# Patient Record
Sex: Female | Born: 1945 | Race: White | Hispanic: No | Marital: Married | Smoking: Never smoker
Health system: Southern US, Community
[De-identification: ages and names within clinical notes are randomized; demographics above are authoritative.]

## PROBLEM LIST (undated history)

## (undated) DIAGNOSIS — E785 Hyperlipidemia, unspecified: Secondary | ICD-10-CM

## (undated) DIAGNOSIS — IMO0002 Reserved for concepts with insufficient information to code with codable children: Secondary | ICD-10-CM

## (undated) DIAGNOSIS — M810 Age-related osteoporosis without current pathological fracture: Secondary | ICD-10-CM

## (undated) DIAGNOSIS — I480 Paroxysmal atrial fibrillation: Secondary | ICD-10-CM

## (undated) DIAGNOSIS — E7800 Pure hypercholesterolemia, unspecified: Secondary | ICD-10-CM

## (undated) DIAGNOSIS — T8859XA Other complications of anesthesia, initial encounter: Secondary | ICD-10-CM

## (undated) DIAGNOSIS — I10 Essential (primary) hypertension: Secondary | ICD-10-CM

## (undated) DIAGNOSIS — B009 Herpesviral infection, unspecified: Secondary | ICD-10-CM

## (undated) DIAGNOSIS — R112 Nausea with vomiting, unspecified: Secondary | ICD-10-CM

## (undated) DIAGNOSIS — R87619 Unspecified abnormal cytological findings in specimens from cervix uteri: Secondary | ICD-10-CM

## (undated) DIAGNOSIS — E559 Vitamin D deficiency, unspecified: Secondary | ICD-10-CM

## (undated) DIAGNOSIS — B977 Papillomavirus as the cause of diseases classified elsewhere: Secondary | ICD-10-CM

## (undated) DIAGNOSIS — M1990 Unspecified osteoarthritis, unspecified site: Secondary | ICD-10-CM

## (undated) DIAGNOSIS — Z9889 Other specified postprocedural states: Secondary | ICD-10-CM

## (undated) DIAGNOSIS — T4145XA Adverse effect of unspecified anesthetic, initial encounter: Secondary | ICD-10-CM

## (undated) HISTORY — DX: Hyperlipidemia, unspecified: E78.5

## (undated) HISTORY — DX: Paroxysmal atrial fibrillation: I48.0

## (undated) HISTORY — DX: Vitamin D deficiency, unspecified: E55.9

## (undated) HISTORY — DX: Herpesviral infection, unspecified: B00.9

## (undated) HISTORY — DX: Age-related osteoporosis without current pathological fracture: M81.0

## (undated) HISTORY — PX: LAPAROSCOPY ABDOMEN DIAGNOSTIC: PRO50

## (undated) HISTORY — PX: BREAST LUMPECTOMY: SHX2

## (undated) HISTORY — PX: DILATION AND CURETTAGE OF UTERUS: SHX78

---

## 1976-04-24 HISTORY — PX: TUBAL LIGATION: SHX77

## 1998-01-27 ENCOUNTER — Other Ambulatory Visit: Admission: RE | Admit: 1998-01-27 | Discharge: 1998-01-27 | Payer: Self-pay

## 2000-08-10 ENCOUNTER — Other Ambulatory Visit: Admission: RE | Admit: 2000-08-10 | Discharge: 2000-08-10 | Payer: Self-pay | Admitting: Family Medicine

## 2004-03-29 ENCOUNTER — Other Ambulatory Visit: Admission: RE | Admit: 2004-03-29 | Discharge: 2004-03-29 | Payer: Self-pay | Admitting: Family Medicine

## 2005-04-11 ENCOUNTER — Other Ambulatory Visit: Admission: RE | Admit: 2005-04-11 | Discharge: 2005-04-11 | Payer: Self-pay | Admitting: Family Medicine

## 2005-11-01 ENCOUNTER — Ambulatory Visit: Payer: Self-pay | Admitting: Internal Medicine

## 2005-11-10 ENCOUNTER — Ambulatory Visit: Payer: Self-pay

## 2006-07-18 ENCOUNTER — Other Ambulatory Visit: Admission: RE | Admit: 2006-07-18 | Discharge: 2006-07-18 | Payer: Self-pay | Admitting: Family Medicine

## 2011-09-23 DIAGNOSIS — B977 Papillomavirus as the cause of diseases classified elsewhere: Secondary | ICD-10-CM | POA: Insufficient documentation

## 2011-09-23 HISTORY — DX: Papillomavirus as the cause of diseases classified elsewhere: B97.7

## 2011-11-18 ENCOUNTER — Encounter (HOSPITAL_COMMUNITY): Payer: Self-pay | Admitting: *Deleted

## 2011-11-18 ENCOUNTER — Inpatient Hospital Stay (HOSPITAL_COMMUNITY)
Admission: AD | Admit: 2011-11-18 | Discharge: 2011-11-18 | Disposition: A | Discharge status: Home / Self Care | Payer: BC Managed Care – PPO | Source: Ambulatory Visit | Attending: Gynecology | Admitting: Gynecology

## 2011-11-18 DIAGNOSIS — N949 Unspecified condition associated with female genital organs and menstrual cycle: Secondary | ICD-10-CM | POA: Insufficient documentation

## 2011-11-18 DIAGNOSIS — IMO0002 Reserved for concepts with insufficient information to code with codable children: Secondary | ICD-10-CM

## 2011-11-18 DIAGNOSIS — N8111 Cystocele, midline: Secondary | ICD-10-CM | POA: Insufficient documentation

## 2011-11-18 HISTORY — DX: Pure hypercholesterolemia, unspecified: E78.00

## 2011-11-18 HISTORY — DX: Other complications of anesthesia, initial encounter: T88.59XA

## 2011-11-18 HISTORY — DX: Papillomavirus as the cause of diseases classified elsewhere: B97.7

## 2011-11-18 HISTORY — DX: Unspecified abnormal cytological findings in specimens from cervix uteri: R87.619

## 2011-11-18 HISTORY — DX: Adverse effect of unspecified anesthetic, initial encounter: T41.45XA

## 2011-11-18 HISTORY — DX: Reserved for concepts with insufficient information to code with codable children: IMO0002

## 2011-11-18 LAB — URINALYSIS, ROUTINE W REFLEX MICROSCOPIC
Ketones, ur: NEGATIVE mg/dL
Leukocytes, UA: NEGATIVE
Nitrite: NEGATIVE
Protein, ur: NEGATIVE mg/dL

## 2011-11-18 NOTE — MAU Note (Signed)
Couple wks ago tried to have sex and hurt too bad. Last Monday noticed something bulging from vagina. Now pressing on rectum. Looked with mirror and something smooth looking at vagina. Some lower back pain,.

## 2011-11-18 NOTE — Progress Notes (Signed)
Written and verbal d/c instructions given and understanding voiced. 

## 2011-11-18 NOTE — MAU Provider Note (Signed)
History     CSN: [id number]  Arrival date and time: 11/18/11 4098   First Provider Initiated Contact with Patient 11/18/11 2008      Chief Complaint  Patient presents with  . Vaginal Prolapse  . Back Pain   HPI Kathryn Arnold is a 66 y.o. female who presents to MAU for vaginal and rectal pressure. The symptoms started approximately 2 weeks ago. She states there is no real pain just pressure. She has an appointment with Dr. Chevis Pretty in 2 days. Has been several years since last saw Dr. Chevis Pretty. She called the office number and told the on call nurse that her symptoms were worse and was instructed to come to MAU. Last pap smear with PCP 2 months ago. Dx with HPV and scheduled to have follow up pap in 6 months. Also treated for a UTI at that time. Problem with vaginal dryness. Menapause age 28. The history was provided by the patient.   OB History    Grav Para Term Preterm Abortions TAB SAB Ect Mult Living   2 2 2  0 0 0 0 0 0 2      Past Medical History  Diagnosis Date  . Abnormal Pap smear   . 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 Complication of anesthesia     alittle slow to wake up-stayed drowsy  . Hypercholesteremia     Past Surgical History  Procedure Date  . Breast lumpectomy   . Dilation and curettage of uterus   . Laparoscopy abdomen diagnostic     Family History  Problem Relation Age of Onset  . Other Neg Hx     History  Substance Use Topics  . Smoking status: Never Smoker   . Smokeless tobacco: Not on file  . Alcohol Use: No    Allergies: No Known Allergies  Prescriptions prior to admission  Medication Sig Dispense Refill  . Calcium Carbonate (CALCIUM 600 PO) Take 1 tablet by mouth 2 (two) times daily.      . Cholecalciferol (VITAMIN D) 2000 UNITS CAPS Take 1 capsule by mouth daily.      . Cyanocobalamin (B-12 PO) Take 1 capsule by mouth daily. Pt does not know strength OTC      . fish oil-omega-3 fatty acids 1000 MG capsule Take 3 g by mouth daily.       . Magnesium 250 MG TABS Take 2 tablets by mouth daily.      . valACYclovir (VALTREX) 500 MG tablet Take 500 mg by mouth daily.      [name] VITAMIN A PO Take 1 capsule by mouth daily. Pt does not know strength      . vitamin C (ASCORBIC ACID) 500 MG tablet Take 500 mg by mouth 2 (two) times daily.        Review of Systems  Constitutional: Negative for fever, chills and weight loss.  HENT: Negative for ear pain, nosebleeds, congestion, sore throat and neck pain.   Eyes: Negative for blurred vision, double vision, photophobia and pain.  Respiratory: Negative for cough, shortness of breath and wheezing.   Cardiovascular: Negative for chest pain, palpitations and leg swelling.  Gastrointestinal: Negative for heartburn, nausea, vomiting, abdominal pain, diarrhea and constipation.  Genitourinary: Positive for urgency and frequency. Negative for dysuria.       No vaginal discharge or vaginal bleeding. dyspareunia   Musculoskeletal: Positive for back pain. Negative for myalgias.  Skin: Negative for itching and rash.  Neurological: Negative for dizziness, sensory change,  speech change, seizures, weakness and headaches.  Endo/Heme/Allergies: Does not bruise/bleed easily.  Psychiatric/Behavioral: Negative for depression. The patient has insomnia. The patient is not nervous/anxious.    Results for orders placed during the hospital encounter of 11/18/11 (from the past 24 hour(s))  URINALYSIS, ROUTINE W REFLEX MICROSCOPIC     Status: Normal   Collection Time   11/18/11  7:25 PM      Component Value Range   Color, Urine YELLOW  YELLOW   APPearance CLEAR  CLEAR   Specific Gravity, Urine 1.015  1.005 - 1.030   pH 7.0  5.0 - 8.0   Glucose, UA NEGATIVE  NEGATIVE mg/dL   Hgb urine dipstick NEGATIVE  NEGATIVE   Bilirubin Urine NEGATIVE  NEGATIVE   Ketones, ur NEGATIVE  NEGATIVE mg/dL   Protein, ur NEGATIVE  NEGATIVE mg/dL   Urobilinogen, UA 0.2  0.0 - 1.0 mg/dL   Nitrite NEGATIVE  NEGATIVE    Leukocytes, UA NEGATIVE  NEGATIVE      Blood pressure 169/84, pulse 64, temperature 98.5 F (36.9 C), temperature source Oral, resp. rate 20, height 5\' 5"  (1.651 m), weight 152 lb (68.947 kg).  Physical Exam  Nursing note and vitals reviewed. Constitutional: She is oriented to person, place, and time. She appears well-developed and well-nourished. No distress.  HENT:  Head: Normocephalic and atraumatic.  Eyes: EOM are normal.  Neck: Neck supple.  Cardiovascular: Normal rate.   Respiratory: Effort normal.  GI: Soft. There is no tenderness.  Genitourinary:       Vagina with prolapse noted.   Musculoskeletal: Normal range of motion.  Neurological: She is alert and oriented to person, place, and time.  Skin: Skin is warm and dry.  Psychiatric: She has a normal mood and affect. Her behavior is normal. Judgment and thought content normal.   Assessment: Cystocele  Plan:  Keep appointment with Dr. Chevis Pretty   Avoid straining, pelvic rest.   Procedures   Kaysee Hergert 11/18/2011, 8:08 PM

## 2011-11-19 NOTE — MAU Provider Note (Signed)
Agree with above note.  Vinson Tietze 11/19/2011 7:48 AM   

## 2012-08-22 ENCOUNTER — Other Ambulatory Visit (INDEPENDENT_AMBULATORY_CARE_PROVIDER_SITE_OTHER): Payer: PRIVATE HEALTH INSURANCE

## 2012-08-22 DIAGNOSIS — E785 Hyperlipidemia, unspecified: Secondary | ICD-10-CM

## 2012-08-22 LAB — COMPLETE METABOLIC PANEL WITH GFR
ALT: 18 U/L (ref 0–35)
AST: 21 U/L (ref 0–37)
Alkaline Phosphatase: 35 U/L — ABNORMAL LOW (ref 39–117)
Creat: 0.76 mg/dL (ref 0.50–1.10)
Total Bilirubin: 0.4 mg/dL (ref 0.3–1.2)

## 2012-08-22 NOTE — Progress Notes (Signed)
Patient here today for labs only. Labs were ordered by ashley fulp.

## 2012-08-23 LAB — NMR LIPOPROFILE WITH LIPIDS
Cholesterol, Total: 136 mg/dL (ref <200)
HDL Particle Number: 36.3 umol/L (ref >=30.5)
HDL-C: 47 mg/dL (ref >=40)
LDL (calc): 67 mg/dL (ref <100)
LDL Size: 20.1 nm — ABNORMAL LOW (ref >20.5)
LP-IR Score: 69 — ABNORMAL HIGH (ref <=45)
Triglycerides: 109 mg/dL (ref <150)

## 2012-08-28 ENCOUNTER — Encounter: Payer: Self-pay | Admitting: Nurse Practitioner

## 2012-08-28 ENCOUNTER — Ambulatory Visit (INDEPENDENT_AMBULATORY_CARE_PROVIDER_SITE_OTHER): Payer: PRIVATE HEALTH INSURANCE | Admitting: Nurse Practitioner

## 2012-08-28 ENCOUNTER — Telehealth: Payer: Self-pay | Admitting: Nurse Practitioner

## 2012-08-28 VITALS — BP 120/79 | HR 58 | Temp 98.4°F | Ht 65.0 in | Wt 147.0 lb

## 2012-08-28 DIAGNOSIS — M810 Age-related osteoporosis without current pathological fracture: Secondary | ICD-10-CM

## 2012-08-28 DIAGNOSIS — E785 Hyperlipidemia, unspecified: Secondary | ICD-10-CM | POA: Insufficient documentation

## 2012-08-28 DIAGNOSIS — B009 Herpesviral infection, unspecified: Secondary | ICD-10-CM

## 2012-08-28 MED ORDER — VALACYCLOVIR HCL 500 MG PO TABS: 500.0000 mg | ORAL_TABLET | Freq: Every day | ORAL | Status: DC

## 2012-08-28 MED ORDER — ALENDRONATE SODIUM 70 MG PO TABS: 70.0000 mg | ORAL_TABLET | ORAL | Status: DC

## 2012-08-28 MED ORDER — ROSUVASTATIN CALCIUM 20 MG PO TABS: 20.0000 mg | ORAL_TABLET | Freq: Every day | ORAL | Status: DC

## 2012-08-28 MED ORDER — ALENDRONATE SODIUM 35 MG PO TABS: 35.0000 mg | ORAL_TABLET | ORAL | Status: DC

## 2012-08-28 NOTE — Telephone Encounter (Signed)
Rx ready for pick up. 

## 2012-08-28 NOTE — Patient Instructions (Signed)

## 2012-08-28 NOTE — Progress Notes (Signed)
  Subjective:    Patient ID: Kathryn Arnold, female    DOB: 1945-09-05, 67 y.o.   MRN: [id number]  Hyperlipidemia This is a chronic problem. The current episode started more than 1 year ago. The problem is controlled. Recent lipid tests were reviewed and are normal. There are no known factors aggravating her hyperlipidemia. Pertinent negatives include no focal sensory loss, focal weakness or leg pain. She is currently on no antihyperlipidemic treatment. The current treatment provides significant improvement of lipids. There are no compliance problems.   Vitamin D Deficiency NOt taking anything right now- Was on Vitamin D OTC but just stopped taking Osteoporosis Last Dexa was July 2013- On Fosamax without complications or SE. HSV 2 Valtrex daily- No recent outbreaks   Review of Systems  Neurological: Negative for focal weakness.  All other systems reviewed and are negative.       Objective:   Physical Exam  Constitutional: She is oriented to person, place, and time. She appears well-developed and well-nourished.  HENT:  Nose: Nose normal.  Mouth/Throat: Oropharynx is clear and moist.  Eyes: EOM are normal.  Neck: Trachea normal, normal range of motion and full passive range of motion without pain. Neck supple. No JVD present. Carotid bruit is not present. No thyromegaly present.  Cardiovascular: Normal rate, regular rhythm, normal heart sounds and intact distal pulses.  Exam reveals no gallop and no friction rub.   No murmur heard. Pulmonary/Chest: Effort normal and breath sounds normal.  Abdominal: Soft. Bowel sounds are normal. She exhibits no distension and no mass. There is no tenderness.  Musculoskeletal: Normal range of motion.  Lymphadenopathy:    She has no cervical adenopathy.  Neurological: She is alert and oriented to person, place, and time. She has normal reflexes.  Skin: Skin is warm and dry.  Psychiatric: She has a normal mood and affect. Her behavior is normal.  Judgment and thought content normal.   BP 120/79  Pulse 58  Temp(Src) 98.4 F (36.9 C) (Oral)  Ht 5\' 5"  (1.651 m)  Wt 147 lb (66.679 kg)  BMI 24.46 kg/m2        Assessment & Plan:  1. Hyperlipidemia with target LDL less than 100 Low fat diet and exercise - rosuvastatin (CRESTOR) 20 MG tablet; Take 1 tablet (20 mg total) by mouth daily.  Dispense: 90 tablet; Refill: 3  2. Osteoporosis, unspecified Weight bearing exercise - alendronate (FOSAMAX) 35 MG tablet; Take 1 tablet (35 mg total) by mouth every 7 (seven) days. Take with a full glass of water on an empty stomach.  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  Dispense: 90 tablet; Refill: 3  Labs reviewed at appointment Patient wants to try crestor QOD instead of daily- Will recheck labs in 3 months  Mary-Margaret Daphine Deutscher, FNP

## 2012-08-28 NOTE — Telephone Encounter (Signed)
Please advise 

## 2012-08-29 NOTE — Telephone Encounter (Signed)
Patient aware to pick up 

## 2012-09-02 ENCOUNTER — Telehealth: Payer: Self-pay | Admitting: Nurse Practitioner

## 2012-09-04 ENCOUNTER — Telehealth: Payer: Self-pay | Admitting: *Deleted

## 2012-09-04 DIAGNOSIS — B009 Herpesviral infection, unspecified: Secondary | ICD-10-CM

## 2012-09-04 MED ORDER — VALACYCLOVIR HCL 500 MG PO TABS: 500.0000 mg | ORAL_TABLET | Freq: Every day | ORAL | Status: DC

## 2012-09-04 NOTE — Telephone Encounter (Signed)
Called 30 day refill into [facility], LM on machine for pt

## 2012-09-04 NOTE — Telephone Encounter (Signed)
DONE

## 2012-10-29 ENCOUNTER — Encounter: Payer: Self-pay | Admitting: Family Medicine

## 2012-10-29 ENCOUNTER — Ambulatory Visit (INDEPENDENT_AMBULATORY_CARE_PROVIDER_SITE_OTHER): Payer: PRIVATE HEALTH INSURANCE | Admitting: Family Medicine

## 2012-10-29 VITALS — BP 132/78 | HR 54 | Temp 98.2°F | Ht 65.0 in | Wt 149.4 lb

## 2012-10-29 DIAGNOSIS — J309 Allergic rhinitis, unspecified: Secondary | ICD-10-CM

## 2012-10-29 DIAGNOSIS — J40 Bronchitis, not specified as acute or chronic: Secondary | ICD-10-CM

## 2012-10-29 DIAGNOSIS — J029 Acute pharyngitis, unspecified: Secondary | ICD-10-CM

## 2012-10-29 LAB — POCT RAPID STREP A (OFFICE): Rapid Strep A Screen: NEGATIVE

## 2012-10-29 MED ORDER — FLUTICASONE PROPIONATE 50 MCG/ACT NA SUSP: 2.0000 | Freq: Every day | NASAL | Status: DC

## 2012-10-29 MED ORDER — AZITHROMYCIN 250 MG PO TABS: ORAL_TABLET | ORAL | Status: DC

## 2012-10-29 MED ORDER — CETIRIZINE HCL 10 MG PO CAPS: 10.0000 mg | ORAL_CAPSULE | Freq: Every day | ORAL | Status: DC

## 2012-10-29 NOTE — Progress Notes (Signed)
  Subjective:    Patient ID: Kathryn Arnold, female    DOB: 1945/09/16, 67 y.o.   MRN: [id number]  HPI URI Symptoms Onset: 7-10 days  Description: nasal congestion, cough, itchy throat Modifying factors:  None   Symptoms Nasal discharge: yes Fever: no Sore throat: mild Cough: yes Wheezing: no Ear pain: no GI symptoms: no Sick contacts: unsure  Red Flags  Stiff neck: no Dyspnea: no Rash: no Swallowing difficulty: no  Sinusitis Risk Factors Headache/face pain: no Double sickening: no tooth pain: no  Allergy Risk Factors Sneezing: yes Itchy scratchy throat: yes Seasonal symptoms: unsure   Flu Risk Factors Headache: no muscle aches: no severe fatigue: no     Review of Systems  All other systems reviewed and are negative.       Objective:   Physical Exam  Constitutional: She appears well-developed and well-nourished.  HENT:  Head: Normocephalic and atraumatic.  Right Ear: External ear normal.  Left Ear: External ear normal.  +nasal erythema, rhinorrhea bilaterally, + post oropharyngeal erythema    Eyes: Conjunctivae are normal. Pupils are equal, round, and reactive to light.  Neck: Normal range of motion.  Cardiovascular: Normal rate and regular rhythm.   Pulmonary/Chest: Effort normal.  Musculoskeletal: Normal range of motion.  Neurological: She is alert.  Skin: Skin is warm.          Assessment & Plan:  Sore throat - Plan: POCT rapid strep A  Allergic rhinitis - Plan: fluticasone (FLONASE) 50 MCG/ACT nasal spray, Cetirizine HCl 10 MG CAPS  Bronchitis - Plan: azithromycin (ZITHROMAX) 250 MG tablet  Suspect overlapping viral and allergic processes. Will start patient on Zyrtec and Flonase for allergic coverage. Z-Pak for lower respiratory as well as sinus coverage. Discuss infectious and respiratory/ENT red flags. Followup as needed.

## 2012-11-27 ENCOUNTER — Other Ambulatory Visit (INDEPENDENT_AMBULATORY_CARE_PROVIDER_SITE_OTHER): Payer: PRIVATE HEALTH INSURANCE

## 2012-11-27 ENCOUNTER — Other Ambulatory Visit: Payer: Self-pay | Admitting: Nurse Practitioner

## 2012-11-27 DIAGNOSIS — I10 Essential (primary) hypertension: Secondary | ICD-10-CM

## 2012-11-27 DIAGNOSIS — E785 Hyperlipidemia, unspecified: Secondary | ICD-10-CM

## 2012-11-27 NOTE — Progress Notes (Signed)
Pt came in for labs only 

## 2012-11-29 LAB — CMP14+EGFR
AST: 22 IU/L (ref 0–40)
BUN: 21 mg/dL (ref 8–27)
CO2: 26 mmol/L (ref 18–29)
Calcium: 9.7 mg/dL (ref 8.6–10.2)
Creatinine, Ser: 0.85 mg/dL (ref 0.57–1.00)
Globulin, Total: 2.1 g/dL (ref 1.5–4.5)
Sodium: 141 mmol/L (ref 134–144)

## 2012-11-29 LAB — NMR, LIPOPROFILE
HDL Cholesterol by NMR: 50 mg/dL (ref >=40)
HDL Particle Number: 35.1 umol/L (ref >=30.5)
LDLC SERPL CALC-MCNC: 87 mg/dL (ref <100)

## 2012-12-04 ENCOUNTER — Ambulatory Visit: Payer: PRIVATE HEALTH INSURANCE | Admitting: Nurse Practitioner

## 2012-12-11 ENCOUNTER — Ambulatory Visit (INDEPENDENT_AMBULATORY_CARE_PROVIDER_SITE_OTHER): Payer: PRIVATE HEALTH INSURANCE | Admitting: Nurse Practitioner

## 2012-12-11 ENCOUNTER — Encounter: Payer: Self-pay | Admitting: Nurse Practitioner

## 2012-12-11 VITALS — BP 135/73 | HR 53 | Temp 97.7°F | Ht 65.0 in | Wt 150.0 lb

## 2012-12-11 DIAGNOSIS — M810 Age-related osteoporosis without current pathological fracture: Secondary | ICD-10-CM

## 2012-12-11 DIAGNOSIS — J309 Allergic rhinitis, unspecified: Secondary | ICD-10-CM

## 2012-12-11 DIAGNOSIS — E785 Hyperlipidemia, unspecified: Secondary | ICD-10-CM

## 2012-12-11 DIAGNOSIS — B009 Herpesviral infection, unspecified: Secondary | ICD-10-CM

## 2012-12-11 MED ORDER — VALACYCLOVIR HCL 500 MG PO TABS: 500.0000 mg | ORAL_TABLET | Freq: Every day | ORAL | Status: DC

## 2012-12-11 NOTE — Progress Notes (Signed)
  Subjective:    Patient ID: [name], female    DOB: [birth year], 67 y.o.   MRN: [id number]  Hyperlipidemia This is a chronic problem. The current episode started more than 1 year ago. The problem is controlled. Recent lipid tests were reviewed and are normal. There are no known factors aggravating her hyperlipidemia. Pertinent negatives include no focal sensory loss, focal weakness or leg pain. She is currently on no antihyperlipidemic treatment. The current treatment provides significant improvement of lipids. There are no compliance problems.   Vitamin D Deficiency NOt taking anything right now- Was on Vitamin D OTC but just stopped taking Osteoporosis Last Dexa was July 2013- On Fosamax without complications or SE. HSV 2 Valtrex daily- No recent outbreaks   Review of Systems  Neurological: Negative for focal weakness.  All other systems reviewed and are negative.       Objective:   Physical Exam  Constitutional: She is oriented to person, place, and time. She appears well-developed and well-nourished.  HENT:  Nose: Nose normal.  Mouth/Throat: Oropharynx is clear and moist.  Eyes: EOM are normal.  Neck: Trachea normal, normal range of motion and full passive range of motion without pain. Neck supple. No JVD present. Carotid bruit is not present. No thyromegaly present.  Cardiovascular: Normal rate, regular rhythm, normal heart sounds and intact distal pulses.  Exam reveals no gallop and no friction rub.   No murmur heard. Pulmonary/Chest: Effort normal and breath sounds normal.  Abdominal: Soft. Bowel sounds are normal. She exhibits no distension and no mass. There is no tenderness.  Musculoskeletal: Normal range of motion.  Lymphadenopathy:    She has no cervical adenopathy.  Neurological: She is alert and oriented to person, place, and time. She has normal reflexes.  Skin: Skin is warm and dry.  Psychiatric: She has a normal mood and affect. Her behavior is normal.  Judgment and thought content normal.   BP 135/73  Pulse 53  Temp(Src) 97.7 F (36.5 C) (Oral)  Ht 5\' 5"  (1.651 m)  Wt 150 lb (68.04 kg)  BMI 24.96 kg/m2  .      Assessment & Plan:  1. Hyperlipidemia with target LDL less than 100 Low fat diet and exercise - 2. Osteoporosis, unspecified Weight bearing exercise  - 3. HSV-2 (herpes simplex virus 2) infection - valACYclovir (VALTREX) 500 MG tablet; Take 1 tablet (500 mg total) by mouth daily.  Dispense: 90 tablet; Refill: 5  Labs reviewed at appointment Patient wants to try crestor QOD instead of daily- Will recheck labs in 3 months  Mary-Margaret Daphine Deutscher, FNP

## 2012-12-11 NOTE — Patient Instructions (Signed)

## 2012-12-27 ENCOUNTER — Ambulatory Visit (INDEPENDENT_AMBULATORY_CARE_PROVIDER_SITE_OTHER): Payer: PRIVATE HEALTH INSURANCE | Admitting: Physician Assistant

## 2012-12-27 ENCOUNTER — Encounter: Payer: Self-pay | Admitting: Physician Assistant

## 2012-12-27 VITALS — BP 129/76 | HR 67 | Temp 97.4°F | Ht 65.0 in | Wt 150.0 lb

## 2012-12-27 DIAGNOSIS — L258 Unspecified contact dermatitis due to other agents: Secondary | ICD-10-CM

## 2012-12-27 MED ORDER — HYDROCORTISONE 2.5 % EX CREA: TOPICAL_CREAM | Freq: Two times a day (BID) | CUTANEOUS | Status: DC

## 2012-12-27 NOTE — Patient Instructions (Addendum)
Contact Dermatitis Contact dermatitis is a reaction to certain substances that touch the skin. Contact dermatitis can be either irritant contact dermatitis or allergic contact dermatitis. Irritant contact dermatitis does not require previous exposure to the substance for a reaction to occur.Allergic contact dermatitis only occurs if you have been exposed to the substance before. Upon a repeat exposure, your body reacts to the substance.  CAUSES  Many substances can cause contact dermatitis. Irritant dermatitis is most commonly caused by repeated exposure to mildly irritating substances, such as:  Makeup.  Soaps.  Detergents.  Bleaches.  Acids.  Metal salts, such as nickel. Allergic contact dermatitis is most commonly caused by exposure to:  Poisonous plants.  Chemicals (deodorants, shampoos).  Jewelry.  Latex.  Neomycin in triple antibiotic cream.  Preservatives in products, including clothing. SYMPTOMS  The area of skin that is exposed may develop:  Dryness or flaking.  Redness.  Cracks.  Itching.  Pain or a burning sensation.  Blisters. With allergic contact dermatitis, there may also be swelling in areas such as the eyelids, mouth, or genitals.  DIAGNOSIS  Your caregiver can usually tell what the problem is by doing a physical exam. In cases where the cause is uncertain and an allergic contact dermatitis is suspected, a patch skin test may be performed to help determine the cause of your dermatitis. TREATMENT Treatment includes protecting the skin from further contact with the irritating substance by avoiding that substance if possible. Barrier creams, powders, and gloves may be helpful. Your caregiver may also recommend:  Steroid creams or ointments applied 2 times daily. For best results, soak the rash area in cool water for 20 minutes. Then apply the medicine. Cover the area with a plastic wrap. You can store the steroid cream in the refrigerator for a "chilly"  effect on your rash. That may decrease itching. Oral steroid medicines may be needed in more severe cases.  Antibiotics or antibacterial ointments if a skin infection is present.  Antihistamine lotion or an antihistamine taken by mouth to ease itching.  Lubricants to keep moisture in your skin.  Burow's solution to reduce redness and soreness or to dry a weeping rash. Mix one packet or tablet of solution in 2 cups cool water. Dip a clean washcloth in the mixture, wring it out a bit, and put it on the affected area. Leave the cloth in place for 30 minutes. Do this as often as possible throughout the day.  Taking several cornstarch or baking soda baths daily if the area is too large to cover with a washcloth. Harsh chemicals, such as alkalis or acids, can cause skin damage that is like a burn. You should flush your skin for 15 to 20 minutes with cold water after such an exposure. You should also seek immediate medical care after exposure. Bandages (dressings), antibiotics, and pain medicine may be needed for severely irritated skin.  HOME CARE INSTRUCTIONS  Avoid the substance that caused your reaction.  Keep the area of skin that is affected away from hot water, soap, sunlight, chemicals, acidic substances, or anything else that would irritate your skin.  Do not scratch the rash. Scratching may cause the rash to become infected.  You may take cool baths to help stop the itching.  Only take over-the-counter or prescription medicines as directed by your caregiver.  See your caregiver for follow-up care as directed to make sure your skin is healing properly. SEEK MEDICAL CARE IF:   Your condition is not better after 3   days of treatment.  You seem to be getting worse.  You see signs of infection such as swelling, tenderness, redness, soreness, or warmth in the affected area.  You have any problems related to your medicines. Document Released: 04/07/2000 Document Revised: 07/03/2011  Document Reviewed: 09/13/2010 [facility] Patient Information 2014 [place], [place].   Avoid using makeup if at all possible until all symptoms clear. Use gentle face wash such as Aveeno or Cetaphil. Stop using eye creams and polysporin ointment. Use Aveeno eczema formula moisturizer to affected areas in the morning and at bedtime. Apply Hydrocortisone twice daily. After symptoms have cleared, try introducing eye products back gradually to determine possible cause.

## 2012-12-27 NOTE — Progress Notes (Signed)
  Subjective:    Patient ID: Kathryn Arnold, female    DOB: [birth year], 67 y.o.   MRN: [id number]  HPI 67 y/o female presents for worsening eye swelling, dryness and redness x 4 months. She states that she's tried OTC hydrocortisone cream, polysporin, Clinique eye formulas and moisturizer w/ no relief. Denies change in medications, face wash or makeup products.     Review of Systems     Negative for SOB, changes in vision, pruritis, sores/ulcerations,  Positive for burning, redness, mild swelling & dryness in AA around eye.  Denies any other generalized skin irritation Objective:   Physical Exam  Positive for small ( approx 1cm x 5mm) patch plaque areas around eyes on inferior border of right lower lid and lateral canthus of L eye. Mild erythema & edema in AA.             Assessment & Plan:  1. Contact Dermatitis / Eczema: Advised patient to stop using all products that she is currently using. Use a gentle face wash such as Aveeno or Cetaphil. Prescribed 2.5% hydrocortisone to use on AA x 10 days. Told patient the importance of not using on mucous membranes of eye and avoiding overuse. Also suggested using Aveeno eczema formula lotion for moisture twice daily after washing. Suggested avoiding wearing makeup until all s/s cleared and gradually introducing products back to determine causative agent. RTC in 2 weeks if symptoms have not cleared.

## 2013-01-27 ENCOUNTER — Ambulatory Visit (INDEPENDENT_AMBULATORY_CARE_PROVIDER_SITE_OTHER): Payer: PRIVATE HEALTH INSURANCE

## 2013-01-27 DIAGNOSIS — Z23 Encounter for immunization: Secondary | ICD-10-CM

## 2013-02-05 ENCOUNTER — Telehealth: Payer: Self-pay | Admitting: Nurse Practitioner

## 2013-02-06 NOTE — Telephone Encounter (Signed)
Aware,done 10-19-11.

## 2013-04-21 ENCOUNTER — Ambulatory Visit (INDEPENDENT_AMBULATORY_CARE_PROVIDER_SITE_OTHER): Payer: PRIVATE HEALTH INSURANCE | Admitting: Family Medicine

## 2013-04-21 ENCOUNTER — Encounter (INDEPENDENT_AMBULATORY_CARE_PROVIDER_SITE_OTHER): Payer: Self-pay

## 2013-04-21 VITALS — BP 130/79 | HR 64 | Temp 98.3°F | Ht 65.0 in | Wt 154.0 lb

## 2013-04-21 DIAGNOSIS — R509 Fever, unspecified: Secondary | ICD-10-CM

## 2013-04-21 DIAGNOSIS — J209 Acute bronchitis, unspecified: Secondary | ICD-10-CM

## 2013-04-21 LAB — POCT RAPID STREP A (OFFICE): Rapid Strep A Screen: NEGATIVE

## 2013-04-21 LAB — POCT INFLUENZA A/B
Influenza A, POC: NEGATIVE
Influenza B, POC: NEGATIVE

## 2013-04-21 MED ORDER — AZITHROMYCIN 250 MG PO TABS: ORAL_TABLET | ORAL | Status: DC

## 2013-04-21 MED ORDER — BENZONATATE 100 MG PO CAPS: 200.0000 mg | ORAL_CAPSULE | Freq: Three times a day (TID) | ORAL | Status: DC | PRN

## 2013-04-21 NOTE — Patient Instructions (Signed)

## 2013-04-21 NOTE — Progress Notes (Signed)
   Subjective:    Patient ID: Kathryn Arnold, female    DOB: [birth year], 67 y.o.   MRN: [id number]  HPI This 67 y.o. female presents for evaluation of aches, fever, cough and uri sx's for 4 days. She is coughing a lot and it is non productive.   Review of Systems C/o cough and uri sx's. No chest pain, SOB, HA, dizziness, vision change, N/V, diarrhea, constipation, dysuria, urinary urgency or frequency, myalgias, arthralgias or rash.     Objective:   Physical Exam Vital signs noted  Well developed well nourished female.  HEENT - Head atraumatic Normocephalic                Eyes - PERRLA, Conjuctiva - clear Sclera- Clear EOMI                Ears - EAC's Wnl TM's Wnl Gross Hearing WNL                Nose - Nares patent                 Throat - oropharanx wnl Respiratory - Lungs CTA bilateral Cardiac - RRR S1 and S2 without murmur GI - Abdomen soft Nontender and bowel sounds active x 4 Extremities - No edema. Neuro - Grossly intact.  Results for orders placed in visit on 04/21/13  POCT RAPID STREP A (OFFICE)      Result Value Range   Rapid Strep A Screen Negative  Negative  POCT INFLUENZA A/B      Result Value Range   Influenza A, POC Negative     Influenza B, POC Negative        Assessment & Plan:  Fever, unspecified - Plan: POCT rapid strep A, POCT Influenza A/B, azithromycin (ZITHROMAX) 250 MG tablet, benzonatate (TESSALON PERLES) 100 MG capsule  Acute bronchitis - Plan: azithromycin (ZITHROMAX) 250 MG tablet, benzonatate (TESSALON PERLES) 100 MG capsule  Push po fluids, rest, tylenol and motrin otc prn as directed for fever, arthralgias, and myalgias.  Follow up prn if sx's continue or persist.  Kathryn Canter FNP

## 2013-06-04 ENCOUNTER — Ambulatory Visit: Payer: PRIVATE HEALTH INSURANCE | Admitting: Nurse Practitioner

## 2013-07-04 ENCOUNTER — Encounter: Payer: Self-pay | Admitting: Nurse Practitioner

## 2013-07-04 ENCOUNTER — Ambulatory Visit (INDEPENDENT_AMBULATORY_CARE_PROVIDER_SITE_OTHER): Payer: PRIVATE HEALTH INSURANCE

## 2013-07-04 ENCOUNTER — Ambulatory Visit (INDEPENDENT_AMBULATORY_CARE_PROVIDER_SITE_OTHER): Payer: PRIVATE HEALTH INSURANCE | Admitting: Nurse Practitioner

## 2013-07-04 VITALS — BP 125/73 | HR 64 | Temp 97.2°F | Ht 65.0 in | Wt 157.0 lb

## 2013-07-04 DIAGNOSIS — E785 Hyperlipidemia, unspecified: Secondary | ICD-10-CM

## 2013-07-04 DIAGNOSIS — E559 Vitamin D deficiency, unspecified: Secondary | ICD-10-CM

## 2013-07-04 DIAGNOSIS — M810 Age-related osteoporosis without current pathological fracture: Secondary | ICD-10-CM

## 2013-07-04 DIAGNOSIS — B009 Herpesviral infection, unspecified: Secondary | ICD-10-CM

## 2013-07-04 IMAGING — CR DG CHEST 2V
2 series · 2 of 2 positions shown · non-contrast
Comparison: None.

CLINICAL DATA: Hyperlipidemia.

EXAM:
CHEST  2 VIEW

[view not recorded (1 of 2)]
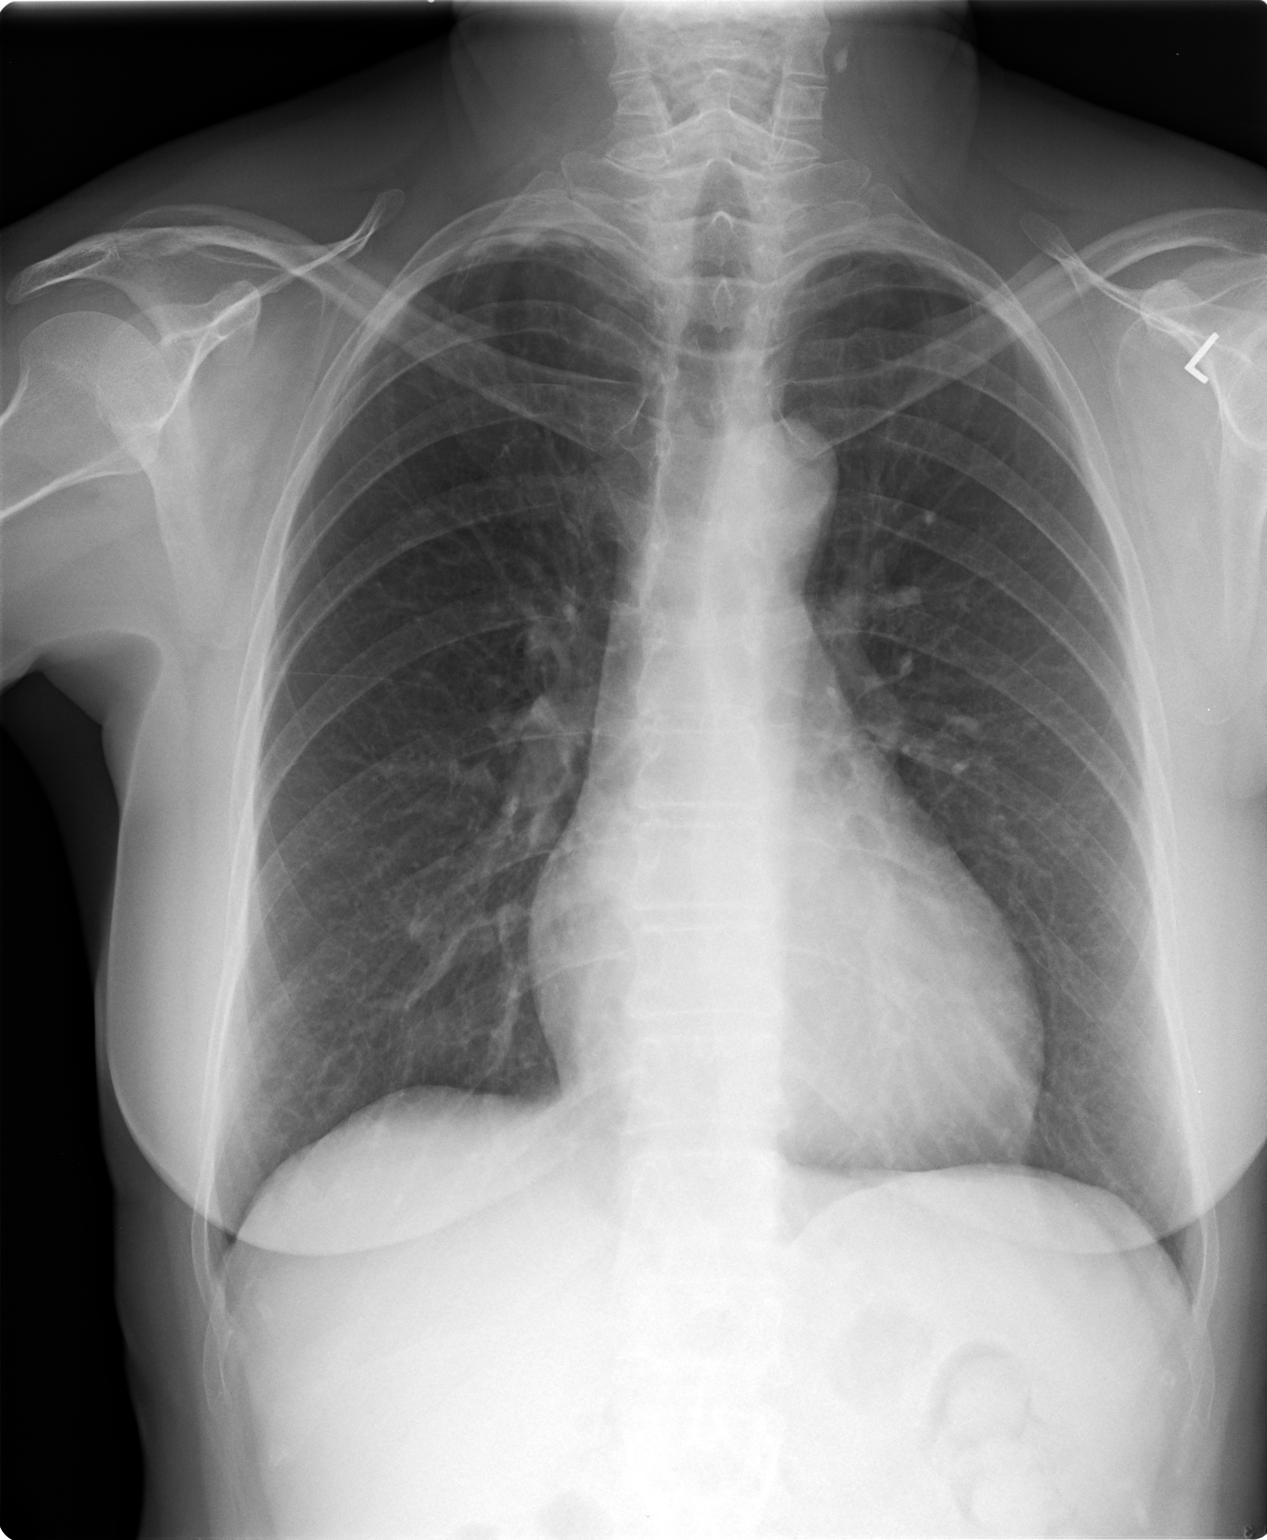

[view not recorded (2 of 2)]
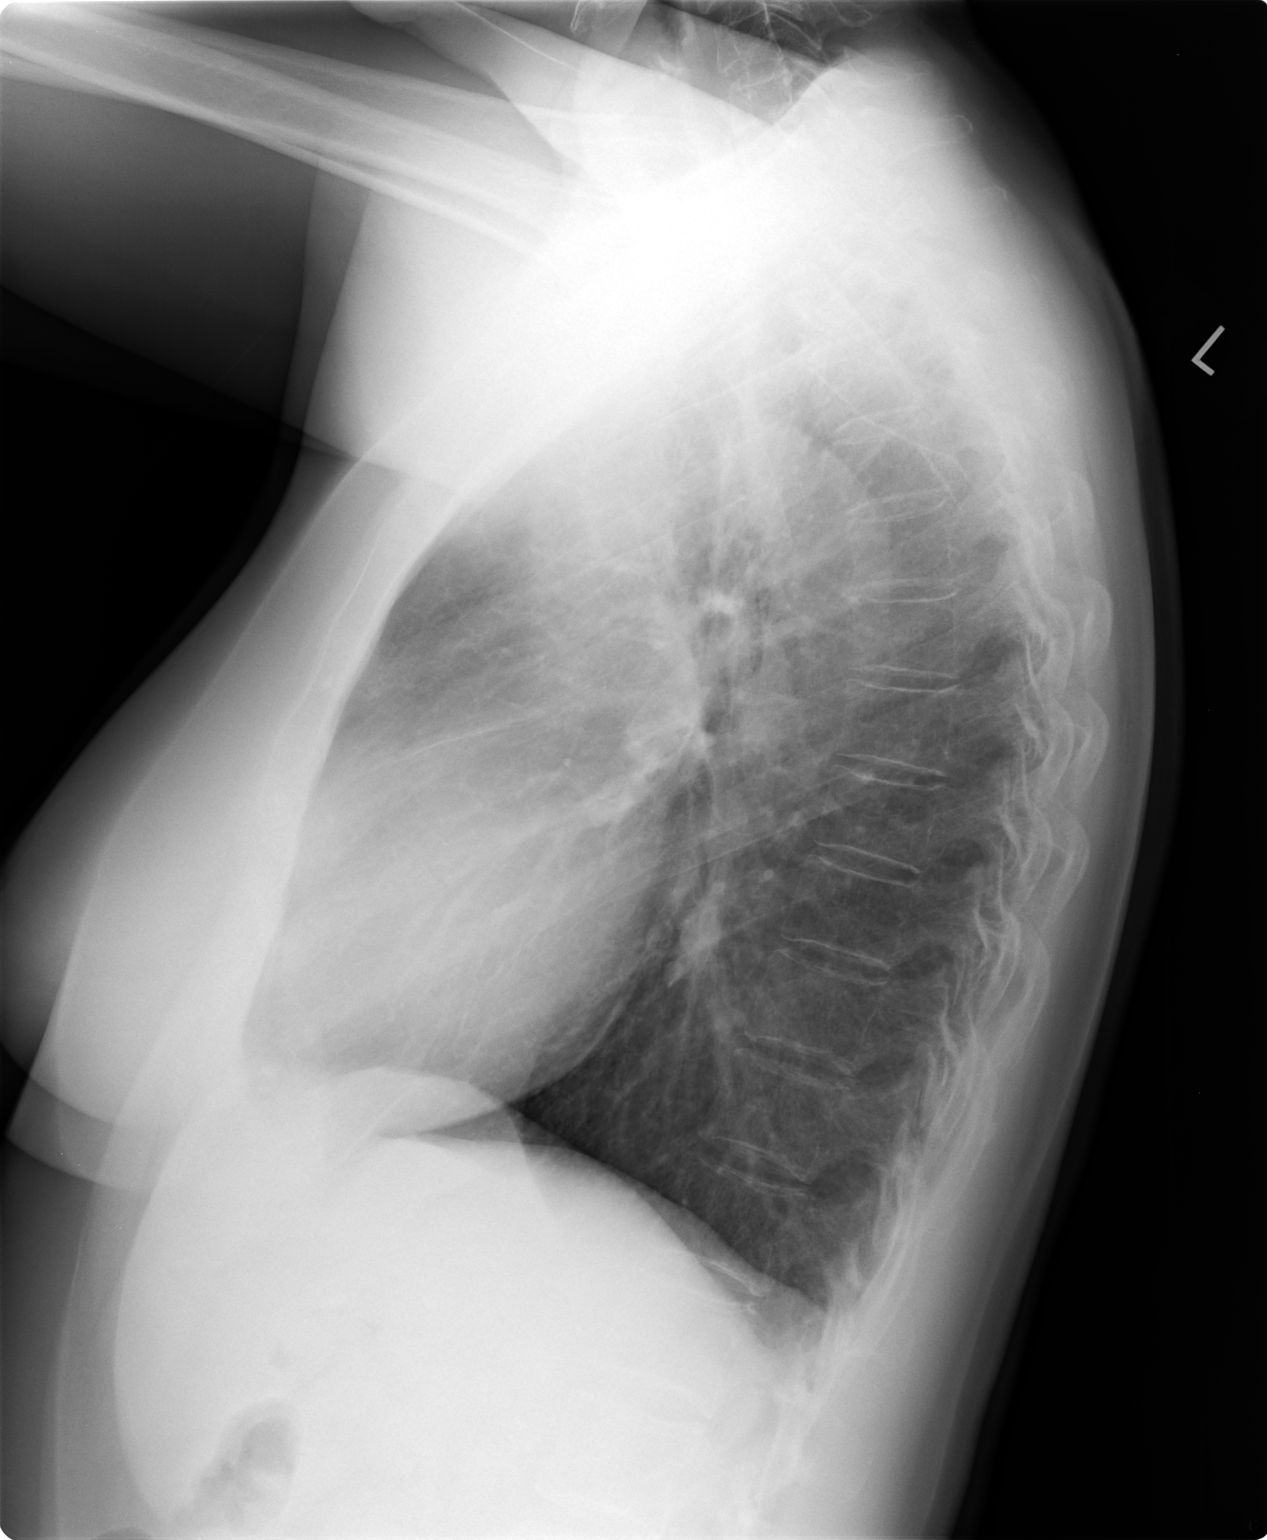

[2 of 2 positions shown; findings below may reference images not displayed]

FINDINGS: Trachea is midline. Heart is mildly enlarged. Biapical pleural
thickening. Lungs are clear. No pleural fluid.
IMPRESSION: No acute findings.

## 2013-07-04 MED ORDER — VALACYCLOVIR HCL 500 MG PO TABS: 500.0000 mg | ORAL_TABLET | Freq: Every day | ORAL | Status: DC

## 2013-07-04 MED ORDER — ROSUVASTATIN CALCIUM 20 MG PO TABS: 20.0000 mg | ORAL_TABLET | Freq: Every day | ORAL | Status: DC

## 2013-07-04 NOTE — Patient Instructions (Signed)

## 2013-07-04 NOTE — Progress Notes (Signed)
Subjective:    Patient ID: Kathryn Arnold, female    DOB: [birth year], 68 y.o.   MRN: [id number]  Patient here today for follow up of chronic medical problems. Doing well today without complaints.  Hyperlipidemia This is a chronic problem. The current episode started more than 1 year ago. The problem is controlled. Recent lipid tests were reviewed and are normal. There are no known factors aggravating her hyperlipidemia. Pertinent negatives include no focal sensory loss, focal weakness or leg pain. She is currently on no antihyperlipidemic treatment. The current treatment provides significant improvement of lipids. There are no compliance problems.   Vitamin D Deficiency NOt taking anything right now- Was on Vitamin D OTC but just stopped taking Osteoporosis Last Dexa was July 2013- On Fosamax without complications or SE. HSV 2 Valtrex daily- No recent outbreaks   Review of Systems  Neurological: Negative for focal weakness.  All other systems reviewed and are negative.       Objective:   Physical Exam  Constitutional: She is oriented to person, place, and time. She appears well-developed and well-nourished.  HENT:  Nose: Nose normal.  Mouth/Throat: Oropharynx is clear and moist.  Eyes: EOM are normal.  Neck: Trachea normal, normal range of motion and full passive range of motion without pain. Neck supple. No JVD present. Carotid bruit is not present. No thyromegaly present.  Cardiovascular: Normal rate, regular rhythm, normal heart sounds and intact distal pulses.  Exam reveals no gallop and no friction rub.   No murmur heard. Pulmonary/Chest: Effort normal and breath sounds normal.  Abdominal: Soft. Bowel sounds are normal. She exhibits no distension and no mass. There is no tenderness.  Musculoskeletal: Normal range of motion.  Lymphadenopathy:    She has no cervical adenopathy.  Neurological: She is alert and oriented to person, place, and time. She has normal reflexes.   Skin: Skin is warm and dry.  Psychiatric: She has a normal mood and affect. Her behavior is normal. Judgment and thought content normal.   BP 125/73  Pulse 64  Temp(Src) 97.2 F (36.2 C) (Oral)  Ht '5\' 5"'  (1.651 m)  Wt 157 lb (71.215 kg)  BMI 26.13 kg/m2 EKG- sinus bradycardia Chest x ray-normal-Preliminary reading by Ronnald Collum, FNP  [facility]        Assessment & Plan:   1. Hyperlipidemia with target LDL less than 100   2. Osteoporosis, unspecified   3. HSV-2 (herpes simplex virus 2) infection   4. Unspecified vitamin D deficiency    Orders Placed This Encounter  Procedures  . DG Chest 2 View    Standing Status: Future     Number of Occurrences: 1     Standing Expiration Date: 09/03/2014    Order Specific Question:  Reason for Exam (SYMPTOM  OR DIAGNOSIS REQUIRED)    Answer:  screening    Order Specific Question:  Preferred imaging location?    Answer:  Internal  . CMP14+EGFR  . NMR, lipoprofile  . Vit D  25 hydroxy (rtn osteoporosis monitoring)  . EKG 12-Lead   Meds ordered this encounter  Medications  . rosuvastatin (CRESTOR) 20 MG tablet    Sig: Take 1 tablet (20 mg total) by mouth daily.    Dispense:  90 tablet    Refill:  3    Order Specific Question:  Supervising Provider    Answer:  Chipper Herb [[id number]]  . valACYclovir (VALTREX) 500 MG tablet    Sig: Take 1 tablet (500 mg total)  by mouth daily.    Dispense:  90 tablet    Refill:  1    Order Specific Question:  Supervising Provider    Answer:  Chipper Herb [[id number]]   Chest xray and EKG done today Labs pending Health maintenance reviewed Diet and exercise encouraged Continue all meds Follow up  In 3 months   [place], FNP

## 2013-07-06 LAB — CMP14+EGFR
A/G RATIO: 1.8 (ref 1.1–2.5)
ALK PHOS: 51 IU/L (ref 39–117)
ALT: 23 IU/L (ref 0–32)
AST: 30 IU/L (ref 0–40)
Albumin: 4.6 g/dL (ref 3.6–4.8)
BILIRUBIN TOTAL: 0.4 mg/dL (ref 0.0–1.2)
BUN / CREAT RATIO: 21 (ref 11–26)
BUN: 16 mg/dL (ref 8–27)
CO2: 23 mmol/L (ref 18–29)
CREATININE: 0.76 mg/dL (ref 0.57–1.00)
Calcium: 9.5 mg/dL (ref 8.7–10.3)
Chloride: 102 mmol/L (ref 97–108)
GFR calc Af Amer: 95 mL/min/{1.73_m2} (ref >59)
GFR, EST NON AFRICAN AMERICAN: 83 mL/min/{1.73_m2} (ref >59)
GLOBULIN, TOTAL: 2.5 g/dL (ref 1.5–4.5)
Glucose: 99 mg/dL (ref 65–99)
POTASSIUM: 4.5 mmol/L (ref 3.5–5.2)
SODIUM: 143 mmol/L (ref 134–144)
Total Protein: 7.1 g/dL (ref 6.0–8.5)

## 2013-07-06 LAB — NMR, LIPOPROFILE
Cholesterol: 173 mg/dL (ref <200)
HDL CHOLESTEROL BY NMR: 49 mg/dL (ref >=40)
HDL PARTICLE NUMBER: 39.2 umol/L (ref >=30.5)
LDL Particle Number: 1441 nmol/L — ABNORMAL HIGH (ref <1000)
LDL SIZE: 20.3 nm — AB (ref >20.5)
LDLC SERPL CALC-MCNC: 94 mg/dL (ref <100)
LP-IR SCORE: 67 — AB (ref <=45)
SMALL LDL PARTICLE NUMBER: 847 nmol/L — AB (ref <=527)
Triglycerides by NMR: 148 mg/dL (ref <150)

## 2013-07-06 LAB — VITAMIN D 25 HYDROXY (VIT D DEFICIENCY, FRACTURES): VIT D 25 HYDROXY: 37.5 ng/mL (ref 30.0–100.0)

## 2013-10-01 ENCOUNTER — Telehealth: Payer: Self-pay | Admitting: Nurse Practitioner

## 2013-10-01 NOTE — Telephone Encounter (Signed)
Patient has mosquito bite that is continuing to swell on her wrist. Patient advised to use some benadryl and if it continues to get worse call and we will schedule her appt in the am

## 2013-10-20 ENCOUNTER — Ambulatory Visit (INDEPENDENT_AMBULATORY_CARE_PROVIDER_SITE_OTHER): Payer: Medicare Other | Admitting: Family Medicine

## 2013-10-20 VITALS — BP 142/87 | HR 61 | Temp 97.2°F | Ht 65.0 in | Wt 159.2 lb

## 2013-10-20 DIAGNOSIS — B029 Zoster without complications: Secondary | ICD-10-CM

## 2013-10-20 MED ORDER — ACYCLOVIR 800 MG PO TABS: 800.0000 mg | ORAL_TABLET | Freq: Every day | ORAL | Status: DC

## 2013-10-20 NOTE — Progress Notes (Signed)
   Subjective:    Patient ID: Kathryn Arnold, female    DOB: [birth year], 68 y.o.   MRN: [id number]  HPI This 68 y.o. female presents for evaluation of rash on her left inner elbow that itches and burns.   Review of Systems C/o rash No chest pain, SOB, HA, dizziness, vision change, N/V, diarrhea, constipation, dysuria, urinary urgency or frequency, myalgias, arthralgias.     Objective:   Physical Exam Vital signs noted  Well developed well nourished female.  HEENT - Head atraumatic Normocephalic                Eyes - PERRLA, Conjuctiva - clear Sclera- Clear EOMI                Ears - EAC's Wnl TM's Wnl Gross Hearing WNL                Throat - oropharanx wnl Respiratory - Lungs CTA bilateral Cardiac - RRR S1 and S2 without murmur Skin - Erythematous Vesicular rash on medial elbow.       Assessment & Plan:  Shingles - Plan: acyclovir (ZOVIRAX) 800 MG tablet po 5x day for 7 days Deatra CanterWilliam J Oxford FNP

## 2013-10-28 ENCOUNTER — Telehealth: Payer: Self-pay | Admitting: Nurse Practitioner

## 2013-10-28 NOTE — Telephone Encounter (Signed)
When ALL lesions have dried up and crusted ove.

## 2013-10-28 NOTE — Telephone Encounter (Signed)
Patient aware. Has knot on chin said if it does not get better she would call back in offered her an appointment

## 2013-10-30 ENCOUNTER — Telehealth: Payer: Self-pay | Admitting: Family Medicine

## 2013-10-31 ENCOUNTER — Ambulatory Visit (INDEPENDENT_AMBULATORY_CARE_PROVIDER_SITE_OTHER): Payer: 59 | Admitting: Family Medicine

## 2013-10-31 ENCOUNTER — Encounter: Payer: Self-pay | Admitting: Family Medicine

## 2013-10-31 VITALS — BP 133/82 | HR 64 | Temp 98.0°F | Ht 65.0 in | Wt 159.4 lb

## 2013-10-31 DIAGNOSIS — B029 Zoster without complications: Secondary | ICD-10-CM

## 2013-10-31 MED ORDER — ACYCLOVIR 800 MG PO TABS: 800.0000 mg | ORAL_TABLET | Freq: Every day | ORAL | Status: DC

## 2013-10-31 NOTE — Progress Notes (Signed)
   Subjective:    Patient ID: Kathryn Arnold, female    DOB: Jan 02, 1946, 68 y.o.   MRN: [id number]  HPI She is having more of the erythematous vesicle rash with discomfort behind her right knee. She was seen last week and rx'd acyclovir which helped the rash resolve and she states she has This behind her right knee.   Review of Systems    No chest pain, SOB, HA, dizziness, vision change, N/V, diarrhea, constipation, dysuria, urinary urgency or frequency, myalgias, arthralgias or rash.  Objective:   Physical Exam  Vital signs noted  Well developed well nourished female.  HEENT - Head atraumatic Normocephalic                Eyes - PERRLA, Conjuctiva - clear Sclera- Clear EOMI                Ears - EAC's Wnl TM's Wnl Gross Hearing WNL                 Throat - oropharanx wnl Respiratory - Lungs CTA bilateral Cardiac - RRR S1 and S2 without murmur Skin - Erythematous rash with vesicles right leg.     Assessment & Plan:  Shingles - Plan: acyclovir (ZOVIRAX) 800 MG tablet Po 5x days for 7 days.  Follow up prn.  Deatra CanterWilliam J Oxford FNP

## 2013-10-31 NOTE — Telephone Encounter (Signed)
Doesn't sound like shingles and please follow up

## 2013-10-31 NOTE — Telephone Encounter (Signed)
Patient aware appointment made 

## 2013-12-01 ENCOUNTER — Ambulatory Visit: Payer: 59 | Admitting: Nurse Practitioner

## 2013-12-18 ENCOUNTER — Ambulatory Visit (INDEPENDENT_AMBULATORY_CARE_PROVIDER_SITE_OTHER): Payer: 59 | Admitting: Nurse Practitioner

## 2013-12-18 ENCOUNTER — Encounter: Payer: Self-pay | Admitting: Nurse Practitioner

## 2013-12-18 VITALS — BP 134/83 | HR 55 | Temp 97.5°F | Ht 65.0 in | Wt 157.0 lb

## 2013-12-18 DIAGNOSIS — B009 Herpesviral infection, unspecified: Secondary | ICD-10-CM

## 2013-12-18 DIAGNOSIS — E559 Vitamin D deficiency, unspecified: Secondary | ICD-10-CM | POA: Insufficient documentation

## 2013-12-18 DIAGNOSIS — M810 Age-related osteoporosis without current pathological fracture: Secondary | ICD-10-CM

## 2013-12-18 DIAGNOSIS — E785 Hyperlipidemia, unspecified: Secondary | ICD-10-CM

## 2013-12-18 NOTE — Patient Instructions (Signed)

## 2013-12-18 NOTE — Progress Notes (Signed)
  Subjective:    Patient ID: Kathryn Arnold, female    DOB: 02/23/1946, 68 y.o.   MRN: [id number]  Patient here today for follow up of chronic medical problems. Doing well today without complaints.  Hyperlipidemia This is a chronic problem. The current episode started more than 1 year ago. The problem is controlled. Recent lipid tests were reviewed and are normal. There are no known factors aggravating her hyperlipidemia. Pertinent negatives include no focal sensory loss, focal weakness or leg pain. She is currently on no antihyperlipidemic treatment. The current treatment provides significant improvement of lipids. There are no compliance problems.   Vitamin D Deficiency Not taking any vitamin D during the spring and summer will restart in the winter months. Osteoporosis Last Dexa was July 2013- On Fosamax without complications or SE. HSV 2 Valtrex daily- No recent outbreaks   Review of Systems  Constitutional: Negative.   Respiratory: Negative.   Cardiovascular: Negative.   Musculoskeletal: Negative.   Skin: Negative.   Neurological: Negative.  Negative for focal weakness.  Psychiatric/Behavioral: Negative.   All other systems reviewed and are negative.      Objective:   Physical Exam  Constitutional: She is oriented to person, place, and time. She appears well-developed and well-nourished.  HENT:  Nose: Nose normal.  Mouth/Throat: Oropharynx is clear and moist.  Eyes: EOM are normal.  Neck: Trachea normal, normal range of motion and full passive range of motion without pain. Neck supple. No JVD present. Carotid bruit is not present. No thyromegaly present.  Cardiovascular: Normal rate, regular rhythm, normal heart sounds and intact distal pulses.  Exam reveals no gallop and no friction rub.   No murmur heard. Pulmonary/Chest: Effort normal and breath sounds normal.  Abdominal: Soft. Bowel sounds are normal. She exhibits no distension and no mass. There is no tenderness.   Musculoskeletal: Normal range of motion.  Lymphadenopathy:    She has no cervical adenopathy.  Neurological: She is alert and oriented to person, place, and time. She has normal reflexes.  Skin: Skin is warm and dry.  Psychiatric: She has a normal mood and affect. Her behavior is normal. Judgment and thought content normal.   BP 134/83  Pulse 55  Temp(Src) 97.5 F (36.4 C) (Oral)  Ht $R'5\' 5"'eY$  (1.651 m)  Wt 157 lb (71.215 kg)  BMI 26.13 kg/m2       Assessment & Plan:   1. HSV-2 (herpes simplex virus 2) infection   2. Hyperlipidemia with target LDL less than 100   3. Osteoporosis, unspecified   4. Vitamin D deficiency    Orders Placed This Encounter  Procedures  . CMP14+EGFR  . NMR, lipoprofile  . Vit D  25 hydroxy (rtn osteoporosis monitoring)   Meds ordered this encounter  Medications  . Multiple Vitamins-Minerals (CENTRUM SILVER ULTRA WOMENS PO)    Sig: Take by mouth.  . valACYclovir (VALTREX) 500 MG tablet    Sig: Take 500 mg by mouth daily.   Patient to check on cost of zostavax Labs pending Health maintenance reviewed Diet and exercise encouraged Continue all meds Follow up  In 4 months   [place], FNP

## 2013-12-18 NOTE — Progress Notes (Signed)
  Subjective:    Patient ID: Kathryn Arnold, female    DOB: 05-28-1945, 68 y.o.   MRN: [id number]  Hyperlipidemia This is a chronic problem. The current episode started more than 1 year ago. The problem is controlled. Recent lipid tests were reviewed and are normal. There are no known factors aggravating her hyperlipidemia. Pertinent negatives include no focal sensory loss, focal weakness or leg pain. She is currently on no antihyperlipidemic treatment. The current treatment provides significant improvement of lipids. There are no compliance problems.   Vitamin D Deficiency NOt taking anything right now- Was on Vitamin D OTC but just stopped taking Osteoporosis Last Dexa was July 2013- On Fosamax without complications or SE. HSV 2 Valtrex daily- No recent outbreaks   Review of Systems  Neurological: Negative for focal weakness.  All other systems reviewed and are negative.      Objective:   Physical Exam  Constitutional: She is oriented to person, place, and time. She appears well-developed and well-nourished.  HENT:  Nose: Nose normal.  Mouth/Throat: Oropharynx is clear and moist.  Eyes: EOM are normal.  Neck: Trachea normal, normal range of motion and full passive range of motion without pain. Neck supple. No JVD present. Carotid bruit is not present. No thyromegaly present.  Cardiovascular: Normal rate, regular rhythm, normal heart sounds and intact distal pulses.  Exam reveals no gallop and no friction rub.   No murmur heard. Pulmonary/Chest: Effort normal and breath sounds normal.  Abdominal: Soft. Bowel sounds are normal. She exhibits no distension and no mass. There is no tenderness.  Musculoskeletal: Normal range of motion.  Lymphadenopathy:    She has no cervical adenopathy.  Neurological: She is alert and oriented to person, place, and time. She has normal reflexes.  Skin: Skin is warm and dry.  Psychiatric: She has a normal mood and affect. Her behavior is normal.  Judgment and thought content normal.   BP 134/83  Pulse 55  Temp(Src) 97.5 F (36.4 C) (Oral)  Ht  (1.651 m)  Wt 157 lb (71.215 kg)  BMI 26.13 kg/m2        Assessment & Plan:  1. Hyperlipidemia with target LDL less than 100 Low fat diet and exercise - rosuvastatin (CRESTOR) 20 MG tablet; Take 1 tablet (20 mg total) by mouth daily.  Dispense: 90 tablet; Refill: 3  2. Osteoporosis, unspecified Weight bearing exercise - alendronate (FOSAMAX) 35 MG tablet; Take 1 tablet (35 mg total) by mouth every 7 (seven) days. Take with a full glass of water on an empty stomach.  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  Dispense: 90 tablet; Refill: 3  Labs reviewed at appointment Patient wants to try crestor QOD instead of daily- Will recheck labs in 3 months  Mary-Margaret Daphine Deutscher, FNP

## 2013-12-19 LAB — CMP14+EGFR
A/G RATIO: 1.9 (ref 1.1–2.5)
ALT: 20 IU/L (ref 0–32)
AST: 23 IU/L (ref 0–40)
Albumin: 4.6 g/dL (ref 3.6–4.8)
Alkaline Phosphatase: 44 IU/L (ref 39–117)
BUN/Creatinine Ratio: 21 (ref 11–26)
BUN: 16 mg/dL (ref 8–27)
CALCIUM: 9.9 mg/dL (ref 8.7–10.3)
CHLORIDE: 104 mmol/L (ref 97–108)
CO2: 25 mmol/L (ref 18–29)
Creatinine, Ser: 0.75 mg/dL (ref 0.57–1.00)
GFR calc Af Amer: 96 mL/min/{1.73_m2} (ref >59)
GFR, EST NON AFRICAN AMERICAN: 83 mL/min/{1.73_m2} (ref >59)
Globulin, Total: 2.4 g/dL (ref 1.5–4.5)
Glucose: 99 mg/dL (ref 65–99)
POTASSIUM: 5.5 mmol/L — AB (ref 3.5–5.2)
SODIUM: 143 mmol/L (ref 134–144)
Total Bilirubin: 0.4 mg/dL (ref 0.0–1.2)
Total Protein: 7 g/dL (ref 6.0–8.5)

## 2013-12-19 LAB — NMR, LIPOPROFILE
Cholesterol: 168 mg/dL (ref 100–199)
HDL CHOLESTEROL BY NMR: 47 mg/dL (ref >39)
HDL Particle Number: 38.5 umol/L (ref >=30.5)
LDL PARTICLE NUMBER: 1273 nmol/L — AB (ref <1000)
LDL SIZE: 20.1 nm (ref >20.5)
LDLC SERPL CALC-MCNC: 82 mg/dL (ref 0–99)
LP-IR Score: 76 — ABNORMAL HIGH (ref <=45)
SMALL LDL PARTICLE NUMBER: 652 nmol/L — AB (ref <=527)
Triglycerides by NMR: 195 mg/dL — ABNORMAL HIGH (ref 0–149)

## 2013-12-19 LAB — VITAMIN D 25 HYDROXY (VIT D DEFICIENCY, FRACTURES): Vit D, 25-Hydroxy: 38.8 ng/mL (ref 30.0–100.0)

## 2013-12-22 ENCOUNTER — Other Ambulatory Visit (INDEPENDENT_AMBULATORY_CARE_PROVIDER_SITE_OTHER): Payer: 59

## 2013-12-22 DIAGNOSIS — R7989 Other specified abnormal findings of blood chemistry: Secondary | ICD-10-CM

## 2013-12-22 DIAGNOSIS — E875 Hyperkalemia: Secondary | ICD-10-CM

## 2013-12-22 NOTE — Progress Notes (Signed)
Pt came in for lab  only 

## 2013-12-23 ENCOUNTER — Telehealth: Payer: Self-pay | Admitting: Nurse Practitioner

## 2013-12-23 LAB — BMP8+EGFR
BUN/Creatinine Ratio: 23 (ref 11–26)
BUN: 17 mg/dL (ref 8–27)
CO2: 26 mmol/L (ref 18–29)
Calcium: 9.5 mg/dL (ref 8.7–10.3)
Chloride: 104 mmol/L (ref 97–108)
Creatinine, Ser: 0.75 mg/dL (ref 0.57–1.00)
GFR calc non Af Amer: 83 mL/min/{1.73_m2} (ref >59)
GFR, EST AFRICAN AMERICAN: 96 mL/min/{1.73_m2} (ref >59)
Glucose: 106 mg/dL — ABNORMAL HIGH (ref 65–99)
Potassium: 5.2 mmol/L (ref 3.5–5.2)
SODIUM: 144 mmol/L (ref 134–144)

## 2013-12-23 NOTE — Telephone Encounter (Signed)
Message copied by Doreatha Massed on Tue Dec 23, 2013 11:07 AM ------      Message from: Bennie Pierini      Created: Tue Dec 23, 2013  9:29 AM       K normal       ------

## 2013-12-28 ENCOUNTER — Emergency Department (HOSPITAL_COMMUNITY): Payer: PRIVATE HEALTH INSURANCE

## 2013-12-28 ENCOUNTER — Encounter (HOSPITAL_COMMUNITY): Payer: Self-pay | Admitting: Emergency Medicine

## 2013-12-28 ENCOUNTER — Emergency Department (HOSPITAL_COMMUNITY)
Admission: EM | Admit: 2013-12-28 | Discharge: 2013-12-28 | Disposition: A | Discharge status: Home / Self Care | Payer: PRIVATE HEALTH INSURANCE | Attending: Emergency Medicine | Admitting: Emergency Medicine

## 2013-12-28 DIAGNOSIS — I4891 Unspecified atrial fibrillation: Secondary | ICD-10-CM | POA: Insufficient documentation

## 2013-12-28 DIAGNOSIS — Z79899 Other long term (current) drug therapy: Secondary | ICD-10-CM | POA: Diagnosis not present

## 2013-12-28 DIAGNOSIS — Z8619 Personal history of other infectious and parasitic diseases: Secondary | ICD-10-CM | POA: Insufficient documentation

## 2013-12-28 DIAGNOSIS — E7800 Pure hypercholesterolemia, unspecified: Secondary | ICD-10-CM | POA: Diagnosis not present

## 2013-12-28 LAB — BASIC METABOLIC PANEL
ANION GAP: 12 (ref 5–15)
BUN: 21 mg/dL (ref 6–23)
CO2: 24 mEq/L (ref 19–32)
Calcium: 9.1 mg/dL (ref 8.4–10.5)
Chloride: 108 mEq/L (ref 96–112)
Creatinine, Ser: 0.61 mg/dL (ref 0.50–1.10)
GFR calc non Af Amer: >90 mL/min (ref >90)
Glucose, Bld: 136 mg/dL — ABNORMAL HIGH (ref 70–99)
POTASSIUM: 3.8 meq/L (ref 3.7–5.3)
Sodium: 144 mEq/L (ref 137–147)

## 2013-12-28 LAB — CBC WITH DIFFERENTIAL/PLATELET
Basophils Absolute: 0 10*3/uL (ref 0.0–0.1)
Basophils Relative: 1 % (ref 0–1)
EOS ABS: 0.1 10*3/uL (ref 0.0–0.7)
Eosinophils Relative: 2 % (ref 0–5)
HEMATOCRIT: 42.2 % (ref 36.0–46.0)
HEMOGLOBIN: 14.7 g/dL (ref 12.0–15.0)
Lymphocytes Relative: 32 % (ref 12–46)
Lymphs Abs: 1.4 10*3/uL (ref 0.7–4.0)
MCH: 30.6 pg (ref 26.0–34.0)
MCHC: 34.8 g/dL (ref 30.0–36.0)
MCV: 87.7 fL (ref 78.0–100.0)
MONO ABS: 0.3 10*3/uL (ref 0.1–1.0)
MONOS PCT: 7 % (ref 3–12)
NEUTROS PCT: 58 % (ref 43–77)
Neutro Abs: 2.6 10*3/uL (ref 1.7–7.7)
Platelets: 218 10*3/uL (ref 150–400)
RBC: 4.81 MIL/uL (ref 3.87–5.11)
RDW: 12 % (ref 11.5–15.5)
WBC: 4.4 10*3/uL (ref 4.0–10.5)

## 2013-12-28 IMAGING — CR DG CHEST 1V PORT
1 series · 1 of 1 positions shown · non-contrast
Comparison: Chest radiograph July 04, 2013

CLINICAL DATA: Atrial fibrillation.

EXAM:
PORTABLE CHEST - 1 VIEW

[portable]
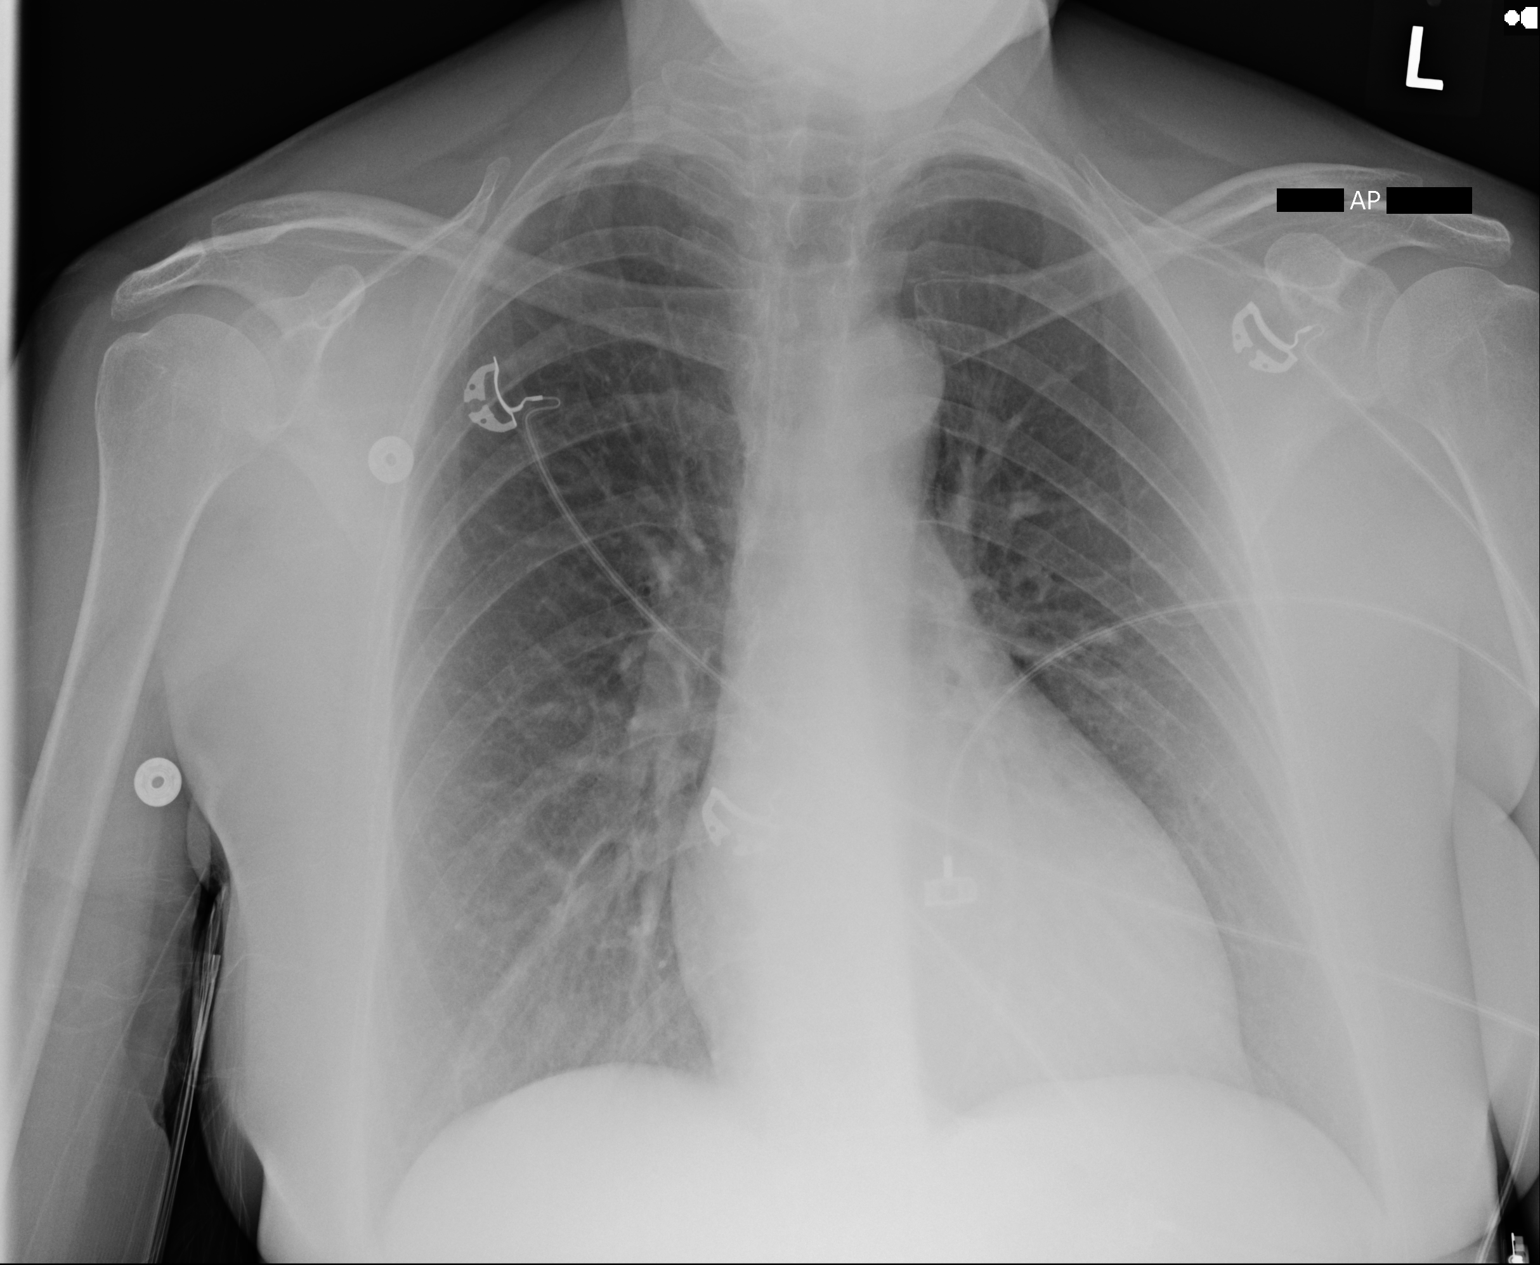

[1 of 1 positions shown; findings below may reference images not displayed]

FINDINGS: The cardiac silhouette is mildly enlarged, mediastinal silhouette is
unremarkable. No pleural effusions or focal consolidations. Mildly
increased lung volumes, similar. No pneumothorax. Soft tissue planes
and included osseous structures are unremarkable.
IMPRESSION: Mild cardiomegaly, no acute pulmonary process.

  By: Ritxi Teran

## 2013-12-28 MED ORDER — FLECAINIDE ACETATE 100 MG PO TABS
300.0000 mg | ORAL_TABLET | ORAL | Status: AC
  Administered 2013-12-28: 300 mg via ORAL
  Filled 2013-12-28: qty 3

## 2013-12-28 MED ORDER — LORAZEPAM 2 MG/ML IJ SOLN
1.0000 mg | Freq: Once | INTRAMUSCULAR | Status: DC
  Filled 2013-12-28: qty 1

## 2013-12-28 MED ORDER — DILTIAZEM HCL 100 MG IV SOLR
5.0000 mg/h | Freq: Once | INTRAVENOUS | Status: AC
  Administered 2013-12-28: 5 mg/h via INTRAVENOUS
  Filled 2013-12-28: qty 100

## 2013-12-28 MED ORDER — FLECAINIDE ACETATE 100 MG PO TABS
ORAL_TABLET | ORAL | Status: AC
  Filled 2013-12-28: qty 3

## 2013-12-28 NOTE — ED Notes (Signed)
Discusses discharge instructions and follow ups.

## 2013-12-28 NOTE — ED Provider Notes (Signed)
CSN: [id number]     Arrival date & time 12/28/13  0208 History   First MD Initiated Contact with Patient 12/28/13 [phone]     Chief Complaint  Patient presents with  . irregular heart rate      (Consider location/radiation/quality/duration/timing/severity/associated sxs/prior Treatment) The history is provided by the patient.   68 year old female that onset about 12:30 AM of a sense of her heart racing. She denies chest pain, heaviness, tightness, or pressure. She denies dyspnea, nausea, diaphoresis. She has had similar episodes in the past but they have stopped on her own. She did take 4 baby aspirin at home and called for an ambulance. Nothing makes symptoms better nothing makes it worse. EMS noted atrial fibrillation with rapid ventricular response and gave a dose of diltiazem with improvement in heart rate. Patient denies any history of heart problems. She states she did have a negative stress test about 8 years ago. She has a history of hyperlipidemia but no history of diabetes or hypertension or tobacco abuse.  Past Medical History  Diagnosis Date  . Abnormal Pap smear   . 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 Complication of anesthesia     alittle slow to wake up-stayed drowsy  . Hypercholesteremia    Past Surgical History  Procedure Laterality Date  . Breast lumpectomy    . Dilation and curettage of uterus    . Laparoscopy abdomen diagnostic     Family History  Problem Relation Age of Onset  . Other Neg Hx   . Osteoporosis Mother   . Cancer Mother     breast  . Hypertension Mother   . Heart disease Father    History  Substance Use Topics  . Smoking status: Never Smoker   . Smokeless tobacco: Not on file  . Alcohol Use: No   OB History   Grav Para Term Preterm Abortions TAB SAB Ect Mult Living   0 0 0 0 0 0 2     Review of Systems  All other systems reviewed and are negative.     Allergies  Review of patient's allergies indicates no known  allergies.  Home Medications   Prior to Admission medications   Medication Sig Start Date End Date Taking? Authorizing Provider  alendronate (FOSAMAX) 70 MG tablet Take 1 tablet (70 mg total) by mouth every 7 (seven) days. Take with a full glass of water on an empty stomach. 08/28/12   Mary-Margaret Daphine Deutscher, FNP  Calcium Carbonate (CALCIUM 600 PO) Take 1 tablet by mouth 2 (two) times daily.    Historical Provider, MD  Cholecalciferol (VITAMIN D) 2000 UNITS CAPS Take 1 capsule by mouth daily.    Historical Provider, MD  Magnesium 250 MG TABS Take 2 tablets by mouth daily.    Historical Provider, MD  Multiple Vitamins-Minerals (CENTRUM SILVER ULTRA WOMENS PO) Take by mouth.    Historical Provider, MD  rosuvastatin (CRESTOR) 20 MG tablet Take 1 tablet (20 mg total) by mouth daily. 07/04/13   Mary-Margaret Daphine Deutscher, FNP  valACYclovir (VALTREX) 500 MG tablet Take 500 mg by mouth daily.    Historical Provider, MD  VITAMIN A PO Take 1 capsule by mouth daily. Pt does not know strength    Historical Provider, MD  vitamin C (ASCORBIC ACID) 500 MG tablet Take 500 mg by mouth 2 (two) times daily.    Historical Provider, MD   BP 129/85  Pulse 84  Temp(Src) 98.5 F (36.9 C) (Oral)  Resp 17  Ht  (1.651 m)  Wt 150 lb (68.04 kg)  BMI 24.96 kg/m2  SpO2 96% Physical Exam  Nursing note and vitals reviewed.  68 year old female, resting comfortably and in no acute distress. Vital signs are normal. Oxygen saturation is 96%, which is normal. Head is normocephalic and atraumatic. PERRLA, EOMI. Oropharynx is clear. Neck is nontender and supple without adenopathy or JVD. Back is nontender and there is no CVA tenderness. Lungs are clear without rales, wheezes, or rhonchi. Chest is nontender. Heart has an irregularly irregular rhythm without murmur. Abdomen is soft, flat, nontender without masses or hepatosplenomegaly and peristalsis is normoactive. Extremities have no cyanosis or edema, full range of motion  is present. Skin is warm and dry without rash. Neurologic: Mental status is normal, cranial nerves are intact, there are no motor or sensory deficits.  ED Course  Procedures (including critical care time) Labs Review Labs Reviewed - No data to display  Imaging Review No results found.   EKG Interpretation   Date/Time:  Sunday December 28 2013 02:18:40 EDT Ventricular Rate:  101 PR Interval:    QRS Duration: 98 QT Interval:  353 QTC Calculation: 457 R Axis:   -10 Text Interpretation:  Atrial fibrillation Minimal ST depression,  anterolateral leads No old tracing to compare Confirmed by [facility]  MD,  Jermiah Soderman ([id number]) on 12/28/2013 2:29:07 AM       EKG Interpretation   Date/Time:  Sunday December 28 2013 03:35:41 EDT Ventricular Rate:  64 PR Interval:  194 QRS Duration: 126 QT Interval:  433 QTC Calculation: 447 R Axis:   -25 Text Interpretation:  Sinus rhythm Nonspecific intraventricular conduction  delay When compared with ECG of 12/28/2013, Normal sinus rhythm has replaced  Atrial fibrillation Non-specific ST-t changes have resolved Confirmed by  [facility]  MD, Destynee Stringfellow ([id number]) on 12/28/2013 3:48:41 AM      MDM   Final diagnoses:  Atrial fibrillation with rapid ventricular response    Atrial fibrillation patient without known history of heart disease. Only risk factor is hyperlipidemia and according to old records this is well controlled. She appears to be a good candidate for pharmacologic conversion with flecainide. She is placed on a diltiazem drip in order to maintain rate control.  She had successful cardioversion following flecainide. She is discharged with instructions to follow up with her PCP for outpatient evaluation and possible cardiology referral. Incidentally noted is blood sugar 136 which will need to be rechecked. And may be elevated just because of physiologic stress of her episode of atrial fibrillation.   Dione Booze, MD 12/28/13 0400

## 2013-12-28 NOTE — ED Notes (Signed)
Pt was given 10 mg IV x2 doses in field via EMS for Afib RVR.

## 2013-12-28 NOTE — Discharge Instructions (Signed)
Your blood sugar was slightly high today-136. Your doctor will need to follow your blood sugars closely.   Atrial Fibrillation Atrial fibrillation is a type of irregular heart rhythm (arrhythmia). During atrial fibrillation, the upper chambers of the heart (atria) quiver continuously in a chaotic pattern. This causes an irregular and often rapid heart rate.  Atrial fibrillation is the result of the heart becoming overloaded with disorganized signals that tell it to beat. These signals are normally released one at a time by a part of the right atrium called the sinoatrial node. They then travel from the atria to the lower chambers of the heart (ventricles), causing the atria and ventricles to contract and pump blood as they pass. In atrial fibrillation, parts of the atria outside of the sinoatrial node also release these signals. This results in two problems. First, the atria receive so many signals that they do not have time to fully contract. Second, the ventricles, which can only receive one signal at a time, beat irregularly and out of rhythm with the atria.  There are three types of atrial fibrillation:   Paroxysmal. Paroxysmal atrial fibrillation starts suddenly and stops on its own within a week.  Persistent. Persistent atrial fibrillation lasts for more than a week. It may stop on its own or with treatment.  Permanent. Permanent atrial fibrillation does not go away. Episodes of atrial fibrillation may lead to permanent atrial fibrillation. Atrial fibrillation can prevent your heart from pumping blood normally. It increases your risk of stroke and can lead to heart failure.  CAUSES   Heart conditions, including a heart attack, heart failure, coronary artery disease, and heart valve conditions.   Inflammation of the sac that surrounds the heart (pericarditis).  Blockage of an artery in the lungs (pulmonary embolism).  Pneumonia or other infections.  Chronic lung disease.  Thyroid  problems, especially if the thyroid is overactive (hyperthyroidism).  Caffeine, excessive alcohol use, and use of some illegal drugs.   Use of some medicines, including certain decongestants and diet pills.  Heart surgery.   Birth defects.  Sometimes, no cause can be found. When this happens, the atrial fibrillation is called lone atrial fibrillation. The risk of complications from atrial fibrillation increases if you have lone atrial fibrillation and you are age 29 years or older. RISK FACTORS  Heart failure.  Coronary artery disease.  Diabetes mellitus.   High blood pressure (hypertension).   Obesity.   Other arrhythmias.   Increased age. SIGNS AND SYMPTOMS   A feeling that your heart is beating rapidly or irregularly.   A feeling of discomfort or pain in your chest.   Shortness of breath.   Sudden light-headedness or weakness.   Getting tired easily when exercising.   Urinating more often than normal (mainly when atrial fibrillation first begins).  In paroxysmal atrial fibrillation, symptoms may start and suddenly stop. DIAGNOSIS  Your health care provider may be able to detect atrial fibrillation when taking your pulse. Your health care provider may have you take a test called an ambulatory electrocardiogram (ECG). An ECG records your heartbeat patterns over a 24-hour period. You may also have other tests, such as:  Transthoracic echocardiogram (TTE). During echocardiography, sound waves are used to evaluate how blood flows through your heart.  Transesophageal echocardiogram (TEE).  Stress test. There is more than one type of stress test. If a stress test is needed, ask your health care provider about which type is best for you.  Chest X-ray exam.  Blood  tests.  Computed tomography (CT). TREATMENT  Treatment may include:  Treating any underlying conditions. For example, if you have an overactive thyroid, treating the condition may correct  atrial fibrillation.  Taking medicine. Medicines may be given to control a rapid heart rate or to prevent blood clots, heart failure, or a stroke.  Having a procedure to correct the rhythm of the heart:  Electrical cardioversion. During electrical cardioversion, a controlled, low-energy shock is delivered to the heart through your skin. If you have chest pain, very low blood pressure, or sudden heart failure, this procedure may need to be done as an emergency.  Catheter ablation. During this procedure, heart tissues that send the signals that cause atrial fibrillation are destroyed.  Surgical ablation. During this surgery, thin lines of heart tissue that carry the abnormal signals are destroyed. This procedure can either be an open-heart surgery or a minimally invasive surgery. With the minimally invasive surgery, small cuts are made to access the heart instead of a large opening.  Pulmonary venous isolation. During this surgery, tissue around the veins that carry blood from the lungs (pulmonary veins) is destroyed. This tissue is thought to carry the abnormal signals. HOME CARE INSTRUCTIONS   Take medicines only as directed by your health care provider. Some medicines can make atrial fibrillation worse or recur.  If blood thinners were prescribed by your health care provider, take them exactly as directed. Too much blood-thinning medicine can cause bleeding. If you take too little, you will not have the needed protection against stroke and other problems.  Perform blood tests at home if directed by your health care provider. Perform blood tests exactly as directed.  Quit smoking if you smoke.  Do not drink alcohol.  Do not drink caffeinated beverages such as coffee, soda, and some teas. You may drink decaffeinated coffee, soda, or tea.   Maintain a healthy weight.Do not use diet pills unless your health care provider approves. They may make heart problems worse.   Follow diet  instructions as directed by your health care provider.  Exercise regularly as directed by your health care provider.  Keep all follow-up visits as directed by your health care provider. This is important. PREVENTION  The following substances can cause atrial fibrillation to recur:   Caffeinated beverages.  Alcohol.  Certain medicines, especially those used for breathing problems.  Certain herbs and herbal medicines, such as those containing ephedra or ginseng.  Illegal drugs, such as cocaine and amphetamines. Sometimes medicines are given to prevent atrial fibrillation from recurring. Proper treatment of any underlying condition is also important in helping prevent recurrence.  SEEK MEDICAL CARE IF:  You notice a change in the rate, rhythm, or strength of your heartbeat.  You suddenly begin urinating more frequently.  You tire more easily when exerting yourself or exercising. SEEK IMMEDIATE MEDICAL CARE IF:   You have chest pain, abdominal pain, sweating, or weakness.  You feel nauseous.  You have shortness of breath.  You suddenly have swollen feet and ankles.  You feel dizzy.  Your face or limbs feel numb or weak.  You have a change in your vision or speech. MAKE SURE YOU:   Understand these instructions.  Will watch your condition.  Will get help right away if you are not doing well or get worse. Document Released: 04/10/2005 Document Revised: 08/25/2013 Document Reviewed: 05/21/2012 [facility] Patient Information 2015 [place], [place]. This information is not intended to replace advice given to you by your health care provider.  Make sure you discuss any questions you have with your health care provider. ° °

## 2013-12-28 NOTE — ED Notes (Addendum)
Pt states she started feeling bad around 1230 this morning. Shaking and heart "beating out of" her chest. EMS was called.

## 2013-12-28 NOTE — ED Notes (Signed)
Updated Dr. Preston Fleeting on pt VS prior to discharge. Had pt stand at bedside to assess for dizziness. Pt stood up and tolerated well with no dizziness. Stated she "felt fine." Dr  Preston Fleeting stated pt was well for discharge.

## 2013-12-30 ENCOUNTER — Ambulatory Visit (INDEPENDENT_AMBULATORY_CARE_PROVIDER_SITE_OTHER): Payer: 59 | Admitting: Family Medicine

## 2013-12-30 ENCOUNTER — Encounter: Payer: Self-pay | Admitting: Family Medicine

## 2013-12-30 VITALS — BP 145/84 | HR 62 | Temp 98.5°F | Ht 65.0 in | Wt 155.2 lb

## 2013-12-30 DIAGNOSIS — I480 Paroxysmal atrial fibrillation: Secondary | ICD-10-CM

## 2013-12-30 DIAGNOSIS — I4891 Unspecified atrial fibrillation: Secondary | ICD-10-CM

## 2013-12-30 NOTE — Progress Notes (Signed)
   Subjective:    Patient ID: Kathryn Arnold, female    DOB: 1945/12/05, 68 y.o.   MRN: [id number]  HPI Patient was seen in the ED on 12/28/13 for atrial fibrillation and was converted to NSR with flecanide and IV diltiazem.  Her CHADS2 score was 1 so she was not anticoagulated.  She states she had similar episode last night and then her HR returned to normal.  Her BP was elevated on her home BP monitor. She is not having any episodes today.   Review of Systems    No chest pain, SOB, HA, dizziness, vision change, N/V, diarrhea, constipation, dysuria, urinary urgency or frequency, myalgias, arthralgias or rash.  Objective:   Physical Exam  Vital signs noted  Well developed well nourished female.  HEENT - Head atraumatic Normocephalic                Eyes - PERRLA, Conjuctiva - clear Sclera- Clear EOMI                Ears - EAC's Wnl TM's Wnl Gross Hearing WNL                Nose - Nares patent                 Throat - oropharanx wnl Respiratory - Lungs CTA bilateral Cardiac - RRR S1 and S2 without murmur GI - Abdomen soft Nontender and bowel sounds active x 4 Extremities - No edema. Neuro - Grossly intact.      Assessment & Plan:  Paroxysmal atrial fibrillation - Plan: Ambulatory referral to Cardiology Appointment made for tomorrow at 3:30 with Dr. Johney Frame EP.  She has CHADS2 of 1 so recommend ASA  po qd.  [name] FNP

## 2013-12-31 ENCOUNTER — Encounter: Payer: Self-pay | Admitting: Internal Medicine

## 2013-12-31 ENCOUNTER — Ambulatory Visit (INDEPENDENT_AMBULATORY_CARE_PROVIDER_SITE_OTHER): Payer: 59 | Admitting: Internal Medicine

## 2013-12-31 VITALS — BP 138/82 | HR 59 | Ht 65.0 in | Wt 155.0 lb

## 2013-12-31 DIAGNOSIS — I4891 Unspecified atrial fibrillation: Secondary | ICD-10-CM | POA: Insufficient documentation

## 2013-12-31 DIAGNOSIS — I480 Paroxysmal atrial fibrillation: Secondary | ICD-10-CM

## 2013-12-31 MED ORDER — RIVAROXABAN 20 MG PO TABS: 20.0000 mg | ORAL_TABLET | Freq: Every day | ORAL | Status: DC

## 2013-12-31 MED ORDER — DILTIAZEM HCL 60 MG PO TABS
ORAL_TABLET | ORAL | Status: DC
Start: 2013-12-31 — End: 2014-01-01

## 2013-12-31 MED ORDER — FLECAINIDE ACETATE 150 MG PO TABS: ORAL_TABLET | ORAL | Status: DC

## 2013-12-31 NOTE — Progress Notes (Signed)
Primary Care Physician: Rudi Heap, MD Referring Physician:   ANALISIA KINGSFORD is a 68 y.o. female with a h/o     Today, she denies symptoms of palpitations, chest pain, shortness of breath, orthopnea, PND, lower extremity edema, dizziness, presyncope, syncope, or neurologic sequela. The patient is tolerating medications without difficulties and is otherwise without complaint today.   Past Medical History  Diagnosis Date  . Abnormal Pap smear   . 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 Complication of anesthesia     alittle slow to wake up-stayed drowsy  . Hypercholesteremia   . Osteoporosis, unspecified     Last DEXA 10/2011   . Hyperlipidemia with target LDL less than 100   . Vitamin D deficiency   . HSV-2 (herpes simplex virus 2) infection   . Paroxysmal atrial fibrillation    Past Surgical History  Procedure Laterality Date  . Breast lumpectomy    . Dilation and curettage of uterus    . Laparoscopy abdomen diagnostic      Current Outpatient Prescriptions  Medication Sig Dispense Refill  . alendronate (FOSAMAX) 70 MG tablet Take 1 tablet (70 mg total) by mouth every 7 (seven) days. Take with a full glass of water on an empty stomach.  12 tablet  3  . Calcium Carbonate (CALCIUM 600 PO) Take 1 tablet by mouth 2 (two) times daily.      . Cholecalciferol (VITAMIN D-3) 1000 UNITS CAPS Take 1 capsule by mouth daily.      . Coenzyme Q10 (CO Q 10) 100 MG CAPS Take 1 capsule by mouth daily.      . Magnesium 250 MG TABS Take 2 tablets by mouth daily.      . Multiple Vitamins-Minerals (CENTRUM SILVER ULTRA WOMENS PO) Take 1 capsule by mouth daily.       . rosuvastatin (CRESTOR) 20 MG tablet Take 1 tablet (20 mg total) by mouth daily.  90 tablet  3  . Selenium 200 MCG CAPS Take 1 capsule by mouth daily.      . valACYclovir (VALTREX) 500 MG tablet Take 500 mg by mouth daily.      [name] VITAMIN A PO Take 8,000 Units by mouth daily.       . vitamin B-12 (CYANOCOBALAMIN) 1000 MCG  tablet Take 1,000 mcg by mouth daily.      . vitamin C (ASCORBIC ACID) 500 MG tablet Take 500 mg by mouth 4 (four) times daily.       . Zinc 50 MG CAPS Take 1 capsule by mouth daily.       No current facility-administered medications for this visit.    No Known Allergies  History   Social History  . Marital Status: Married    Spouse Name: N/A    Number of Children: N/A  . Years of Education: N/A   Occupational History  . Not on file.   Social History Main Topics  . Smoking status: Never Smoker   . Smokeless tobacco: Not on file  . Alcohol Use: No  . Drug Use: No  . Sexual Activity: Yes    Birth Control/ Protection: None   Other Topics Concern  . Not on file   Social History Narrative   Lives in [place] with spouse.    Family History  Problem Relation Age of Onset  . Other Neg Hx   . Osteoporosis Mother   . Cancer Mother     breast  . Hypertension Mother   . Heart  disease Father     ROS- All systems are reviewed and negative except as per the HPI above  Physical Exam: Filed Vitals:   12/31/13 1535  BP: 138/82  Pulse: 59  Height:  (1.651 m)  Weight: 155 lb (70.308 kg)    GEN- The patient is well appearing, alert and oriented x 3 today.   Head- normocephalic, atraumatic Eyes-  Sclera clear, conjunctiva pink Ears- hearing intact Oropharynx- clear Neck- supple, no JVP Lymph- no cervical lymphadenopathy Lungs- Clear to ausculation bilaterally, normal work of breathing Heart- Regular rate and rhythm, no murmurs, rubs or gallops, PMI not laterally displaced GI- soft, NT, ND, + BS Extremities- no clubbing, cyanosis, or edema MS- no significant deformity or atrophy Skin- no rash or lesion Psych- euthymic mood, full affect Neuro- strength and sensation are intact  EKG today reveals sinus rhythm 59 bpm, PR 162., QRS 100, incomplete RBBB Epic records including recent ER visit, EKG from ER, and primary care note are reviewed  Assessment and  Plan:  1. Paroxysmal atrial fibrillation The patient has infrequent episodes of afib. Check TFTs and echo  chads2vasc score is 2.  CrCl 100.  Today, I discussed Coumadin as well as novel anticoagulants including Pradaxa, Xarelto, Savaysa, and Eliquis today as indicated for risk reduction in stroke and systemic emboli with nonvalvular atrial fibrillation.  Risks, benefits, and alternatives to each of these drugs were discussed at length today. Stop ASA start xarelto  daily  Therapeutic strategies for afib including medicine and ablation were discussed in detail with the patient today.  At this time, I think that a "pill in pocket" approach may be best If she has afib, she will take cardizem  po q 6hours.  If afib persists after 30 minutes, she will take flecainide  po x 1  Follow-up with Rudi Coco NP in 3 months I will see as needed going forward

## 2013-12-31 NOTE — Patient Instructions (Signed)
Your physician recommends that you schedule a follow-up appointment in: 3 months with Rudi Coco, NP   Your physician has requested that you have an echocardiogram. Echocardiography is a painless test that uses sound waves to create images of your heart. It provides your doctor with information about the size and shape of your heart and how well your heart's chambers and valves are working. This procedure takes approximately one hour. There are no restrictions for this procedure.  Your physician recommends that you return for lab work today: TSH/T4  Your physician has recommended you make the following change in your medication:  1) Start Xarelto  daily 2) Stop Aspirin 3) Take Cardizem  as needed for afib( fast heart rates) 4) Take Flecainide  at onset of afib

## 2014-01-01 ENCOUNTER — Other Ambulatory Visit: Payer: Self-pay | Admitting: *Deleted

## 2014-01-01 LAB — T4, FREE: Free T4: 1 ng/dL (ref 0.60–1.60)

## 2014-01-01 LAB — TSH: TSH: 0.74 u[IU]/mL (ref 0.35–4.50)

## 2014-01-01 MED ORDER — FLECAINIDE ACETATE 150 MG PO TABS: ORAL_TABLET | ORAL | Status: DC

## 2014-01-01 MED ORDER — DILTIAZEM HCL 60 MG PO TABS: ORAL_TABLET | ORAL | Status: DC

## 2014-01-01 MED ORDER — RIVAROXABAN 20 MG PO TABS: 20.0000 mg | ORAL_TABLET | Freq: Every day | ORAL | Status: DC

## 2014-01-06 ENCOUNTER — Ambulatory Visit (HOSPITAL_COMMUNITY): Payer: 59 | Attending: Cardiology

## 2014-01-06 DIAGNOSIS — M810 Age-related osteoporosis without current pathological fracture: Secondary | ICD-10-CM | POA: Insufficient documentation

## 2014-01-06 DIAGNOSIS — E559 Vitamin D deficiency, unspecified: Secondary | ICD-10-CM | POA: Insufficient documentation

## 2014-01-06 DIAGNOSIS — E785 Hyperlipidemia, unspecified: Secondary | ICD-10-CM | POA: Diagnosis not present

## 2014-01-06 DIAGNOSIS — I4891 Unspecified atrial fibrillation: Secondary | ICD-10-CM | POA: Diagnosis not present

## 2014-01-06 NOTE — Progress Notes (Signed)
2D Echo completed. 01/06/2014 

## 2014-01-20 ENCOUNTER — Ambulatory Visit (INDEPENDENT_AMBULATORY_CARE_PROVIDER_SITE_OTHER): Payer: 59 | Admitting: Family

## 2014-01-20 ENCOUNTER — Telehealth: Payer: Self-pay | Admitting: Internal Medicine

## 2014-01-20 VITALS — BP 193/97 | HR 78 | Ht 65.0 in | Wt 155.0 lb

## 2014-01-20 DIAGNOSIS — I10 Essential (primary) hypertension: Secondary | ICD-10-CM

## 2014-01-20 MED ORDER — METOPROLOL SUCCINATE ER 50 MG PO TB24: 100.0000 mg | ORAL_TABLET | Freq: Every day | ORAL | Status: DC

## 2014-01-20 NOTE — Telephone Encounter (Addendum)
Went to her PCP today and was started on Metoprolol 50mg  ---take 2 tablets every morning  She is also aware of her echo results

## 2014-01-20 NOTE — Telephone Encounter (Signed)
°  Following up:   Pt would like a call back with her Echo 9/15 results please.   Pt also has some questions.  BP was high 6 pm 171/100  /// 8 pm 174/104  ///    9/30 146/91 6:30 am  157/89    7:45 am.

## 2014-01-20 NOTE — Patient Instructions (Signed)
DASH Eating Plan DASH stands for "Dietary Approaches to Stop Hypertension." The DASH eating plan is a healthy eating plan that has been shown to reduce high blood pressure (hypertension). Additional health benefits may include reducing the risk of type 2 diabetes mellitus, heart disease, and stroke. The DASH eating plan may also help with weight loss. WHAT DO I NEED TO KNOW ABOUT THE DASH EATING PLAN? For the DASH eating plan, you will follow these general guidelines:  Choose foods with a percent daily value for sodium of less than 5% (as listed on the food label).  Use salt-free seasonings or herbs instead of table salt or sea salt.  Check with your health care provider or pharmacist before using salt substitutes.  Eat lower-sodium products, often labeled as "lower sodium" or "no salt added."  Eat fresh foods.  Eat more vegetables, fruits, and low-fat dairy products.  Choose whole grains. Look for the word "whole" as the first word in the ingredient list.  Choose fish and skinless chicken or turkey more often than red meat. Limit fish, poultry, and meat to 6 oz (170 g) each day.  Limit sweets, desserts, sugars, and sugary drinks.  Choose heart-healthy fats.  Limit cheese to 1 oz (28 g) per day.  Eat more home-cooked food and less restaurant, buffet, and fast food.  Limit fried foods.  Cook foods using methods other than frying.  Limit canned vegetables. If you do use them, rinse them well to decrease the sodium.  When eating at a restaurant, ask that your food be prepared with less salt, or no salt if possible. WHAT FOODS CAN I EAT? Seek help from a dietitian for individual calorie needs. Grains Whole grain or whole wheat bread. Brown rice. Whole grain or whole wheat pasta. Quinoa, bulgur, and whole grain cereals. Low-sodium cereals. Corn or whole wheat flour tortillas. Whole grain cornbread. Whole grain crackers. Low-sodium crackers. Vegetables Fresh or frozen vegetables  (raw, steamed, roasted, or grilled). Low-sodium or reduced-sodium tomato and vegetable juices. Low-sodium or reduced-sodium tomato sauce and paste. Low-sodium or reduced-sodium canned vegetables.  Fruits All fresh, canned (in natural juice), or frozen fruits. Meat and Other Protein Products Ground beef (85% or leaner), grass-fed beef, or beef trimmed of fat. Skinless chicken or turkey. Ground chicken or turkey. Pork trimmed of fat. All fish and seafood. Eggs. Dried beans, peas, or lentils. Unsalted nuts and seeds. Unsalted canned beans. Dairy Low-fat dairy products, such as skim or 1% milk, 2% or reduced-fat cheeses, low-fat ricotta or cottage cheese, or plain low-fat yogurt. Low-sodium or reduced-sodium cheeses. Fats and Oils Tub margarines without trans fats. Light or reduced-fat mayonnaise and salad dressings (reduced sodium). Avocado. Safflower, olive, or canola oils. Natural peanut or almond butter. Other Unsalted popcorn and pretzels. The items listed above may not be a complete list of recommended foods or beverages. Contact your dietitian for more options. WHAT FOODS ARE NOT RECOMMENDED? Grains White bread. White pasta. White rice. Refined cornbread. Bagels and croissants. Crackers that contain trans fat. Vegetables Creamed or fried vegetables. Vegetables in a cheese sauce. Regular canned vegetables. Regular canned tomato sauce and paste. Regular tomato and vegetable juices. Fruits Dried fruits. Canned fruit in light or heavy syrup. Fruit juice. Meat and Other Protein Products Fatty cuts of meat. Ribs, chicken wings, bacon, sausage, bologna, salami, chitterlings, fatback, hot dogs, bratwurst, and packaged luncheon meats. Salted nuts and seeds. Canned beans with salt. Dairy Whole or 2% milk, cream, half-and-half, and cream cheese. Whole-fat or sweetened yogurt. Full-fat   cheeses or blue cheese. Nondairy creamers and whipped toppings. Processed cheese, cheese spreads, or cheese  curds. Condiments Onion and garlic salt, seasoned salt, table salt, and sea salt. Canned and packaged gravies. Worcestershire sauce. Tartar sauce. Barbecue sauce. Teriyaki sauce. Soy sauce, including reduced sodium. Steak sauce. Fish sauce. Oyster sauce. Cocktail sauce. Horseradish. Ketchup and mustard. Meat flavorings and tenderizers. Bouillon cubes. Hot sauce. Tabasco sauce. Marinades. Taco seasonings. Relishes. Fats and Oils Butter, stick margarine, lard, shortening, ghee, and bacon fat. Coconut, palm kernel, or palm oils. Regular salad dressings. Other Pickles and olives. Salted popcorn and pretzels. The items listed above may not be a complete list of foods and beverages to avoid. Contact your dietitian for more information. WHERE CAN I FIND MORE INFORMATION? National Heart, Lung, and Blood Institute: www.nhlbi.nih.gov/health/health-topics/topics/dash/ Document Released: 03/30/2011 Document Revised: 08/25/2013 Document Reviewed: 02/12/2013 ExitCare Patient Information 2015 [employer]. This information is not intended to replace advice given to you by your health care provider. Make sure you discuss any questions you have with your health care provider. Hypertension Hypertension, commonly called high blood pressure, is when the force of blood pumping through your arteries is too strong. Your arteries are the blood vessels that carry blood from your heart throughout your body. A blood pressure reading consists of a higher number over a lower number, such as 110/72. The higher number (systolic) is the pressure inside your arteries when your heart pumps. The lower number (diastolic) is the pressure inside your arteries when your heart relaxes. Ideally you want your blood pressure below 120/80. Hypertension forces your heart to work harder to pump blood. Your arteries may become narrow or stiff. Having hypertension puts you at risk for heart disease, stroke, and other problems.  RISK  FACTORS Some risk factors for high blood pressure are controllable. Others are not.  Risk factors you cannot control include:   Race. You may be at higher risk if you are African American.  Age. Risk increases with age.  Gender. Men are at higher risk than women before age 45 years. After age 65, women are at higher risk than men. Risk factors you can control include:  Not getting enough exercise or physical activity.  Being overweight.  Getting too much fat, sugar, calories, or salt in your diet.  Drinking too much alcohol. SIGNS AND SYMPTOMS Hypertension does not usually cause signs or symptoms. Extremely high blood pressure (hypertensive crisis) may cause headache, anxiety, shortness of breath, and nosebleed. DIAGNOSIS  To check if you have hypertension, your health care provider will measure your blood pressure while you are seated, with your arm held at the level of your heart. It should be measured at least twice using the same arm. Certain conditions can cause a difference in blood pressure between your right and left arms. A blood pressure reading that is higher than normal on one occasion does not mean that you need treatment. If one blood pressure reading is high, ask your health care provider about having it checked again. TREATMENT  Treating high blood pressure includes making lifestyle changes and possibly taking medicine. Living a healthy lifestyle can help lower high blood pressure. You may need to change some of your habits. Lifestyle changes may include:  Following the DASH diet. This diet is high in fruits, vegetables, and whole grains. It is low in salt, red meat, and added sugars.  Getting at least 2 hours of brisk physical activity every week.  Losing weight if necessary.  Not smoking.  Limiting   alcoholic beverages.  Learning ways to reduce stress. If lifestyle changes are not enough to get your blood pressure under control, your health care provider may  prescribe medicine. You may need to take more than one. Work closely with your health care provider to understand the risks and benefits. HOME CARE INSTRUCTIONS  Have your blood pressure rechecked as directed by your health care provider.   Take medicines only as directed by your health care provider. Follow the directions carefully. Blood pressure medicines must be taken as prescribed. The medicine does not work as well when you skip doses. Skipping doses also puts you at risk for problems.   Do not smoke.   Monitor your blood pressure at home as directed by your health care provider. SEEK MEDICAL CARE IF:   You think you are having a reaction to medicines taken.  You have recurrent headaches or feel dizzy.  You have swelling in your ankles.  You have trouble with your vision. SEEK IMMEDIATE MEDICAL CARE IF:  You develop a severe headache or confusion.  You have unusual weakness, numbness, or feel faint.  You have severe chest or abdominal pain.  You vomit repeatedly.  You have trouble breathing. MAKE SURE YOU:   Understand these instructions.  Will watch your condition.  Will get help right away if you are not doing well or get worse. Document Released: 04/10/2005 Document Revised: 08/25/2013 Document Reviewed: 01/31/2013 ExitCare Patient Information 2015 [employer]. This information is not intended to replace advice given to you by your health care provider. Make sure you discuss any questions you have with your health care provider.  

## 2014-01-20 NOTE — Progress Notes (Signed)
   Subjective:    Patient ID: Kathryn Arnold, female    DOB: Dec 24, 1945, 68 y.o.   MRN: [id number]  Hypertension This is a new problem. The current episode started 1 to 4 weeks ago. The problem has been waxing and waning since onset. The problem is uncontrolled. Associated symptoms include palpitations. Pertinent negatives include no anxiety, blurred vision, headaches, peripheral edema or shortness of breath. Risk factors for coronary artery disease include dyslipidemia, post-menopausal state and family history. Past treatments include nothing. The current treatment provides no improvement. There is no history of kidney disease, CAD/MI, CVA, heart failure or a thyroid problem. There is no history of sleep apnea.      Review of Systems  Constitutional: Negative.   HENT: Negative.   Eyes: Negative.  Negative for blurred vision.  Respiratory: Negative.  Negative for shortness of breath.   Cardiovascular: Positive for palpitations.  Gastrointestinal: Negative.   Endocrine: Negative.   Genitourinary: Negative.   Musculoskeletal: Negative.   Neurological: Negative.  Negative for headaches.  Hematological: Negative.   Psychiatric/Behavioral: Negative.   All other systems reviewed and are negative.      Objective:   Physical Exam  Vitals reviewed. Constitutional: She is oriented to person, place, and time. She appears well-developed and well-nourished. No distress.  HENT:  Head: Normocephalic and atraumatic.  Right Ear: External ear normal.  Left Ear: External ear normal.  Nose: Nose normal.  Mouth/Throat: Oropharynx is clear and moist.  Eyes: Pupils are equal, round, and reactive to light.  Neck: Normal range of motion. Neck supple. No thyromegaly present.  Cardiovascular: Normal rate, regular rhythm, normal heart sounds and intact distal pulses.   No murmur heard. Pulmonary/Chest: Effort normal and breath sounds normal. No respiratory distress. She has no wheezes.  Abdominal: Soft.  Bowel sounds are normal. She exhibits no distension. There is no tenderness.  Musculoskeletal: Normal range of motion. She exhibits no edema and no tenderness.  Neurological: She is alert and oriented to person, place, and time. She has normal reflexes. No cranial nerve deficit.  Skin: Skin is warm and dry.  Psychiatric: She has a normal mood and affect. Her behavior is normal. Judgment and thought content normal.    BP 193/97  Pulse 78  Ht _0  (1.651 m)  Wt 155 lb (70.308 kg)  BMI 25.79 kg/m2       Assessment & Plan:  1. Essential hypertension, benign --Pt started on metoprolol succinate 136m daily  Daily blood pressure log given with instructions on how to fill out and told to bring to next visit -Dash diet information given -Exercise encouraged - Stress Management  -Continue current meds -RTO in 1 week for recheck of blood press - BMP8+EGFR - metoprolol succinate (TOPROL-XL) 50 MG 24 hr tablet; Take 2 tablets (100 mg total) by mouth daily. Take with or immediately following a meal.  Dispense: 90 tablet; Refill: 3[facility] FNP

## 2014-01-21 LAB — BMP8+EGFR
BUN/Creatinine Ratio: 21 (ref 11–26)
BUN: 20 mg/dL (ref 8–27)
CHLORIDE: 101 mmol/L (ref 97–108)
CO2: 25 mmol/L (ref 18–29)
Calcium: 9.8 mg/dL (ref 8.7–10.3)
Creatinine, Ser: 0.97 mg/dL (ref 0.57–1.00)
GFR calc Af Amer: 70 mL/min/{1.73_m2} (ref >59)
GFR calc non Af Amer: 61 mL/min/{1.73_m2} (ref >59)
Glucose: 108 mg/dL — ABNORMAL HIGH (ref 65–99)
POTASSIUM: 4.8 mmol/L (ref 3.5–5.2)
Sodium: 144 mmol/L (ref 134–144)

## 2014-01-23 ENCOUNTER — Telehealth: Payer: Self-pay | Admitting: Family

## 2014-01-23 MED ORDER — DILTIAZEM HCL ER COATED BEADS 120 MG PO TB24: 120.0000 mg | ORAL_TABLET | Freq: Every day | ORAL | Status: DC

## 2014-01-23 NOTE — Telephone Encounter (Signed)
Her blood pressure is running good and it but it physically makes her weak and like she is going to pass out from it would like a different blood pressure med.

## 2014-01-23 NOTE — Telephone Encounter (Signed)
Stop taking meoptolol and new RX sent to pharmacy to increase Cardizem to 120mg .

## 2014-01-23 NOTE — Telephone Encounter (Signed)
Patient has not taken any before Cardizem is okay to take 60 mg

## 2014-01-23 NOTE — Telephone Encounter (Signed)
Patient aware.

## 2014-01-23 NOTE — Telephone Encounter (Signed)
Pt needs to take BP and pulse at home and let us know what the value is. Last visit pt's Blood pressure was not at goal, if pt can tolerate metoprolol then she needs to take it. PT needs to be on BP medication.

## 2014-01-23 NOTE — Telephone Encounter (Signed)
If metoprolol makes her feel weak and pass out she needs to stop it. Pt can take the cardizem she already has at home. She was on 60 mg, but now she can take 2 tablets until [facility].

## 2014-01-25 ENCOUNTER — Telehealth: Payer: Self-pay | Admitting: Cardiology

## 2014-01-25 NOTE — Telephone Encounter (Signed)
Pt having recurrent AF. She did not understand Dr Jenel LucksAllred's instructions on taking Diltiazem and Flecainide. I reviewed  this with her.   Corine ShelterLUKE Eh Sauseda PA-C 01/25/2014 12:58 PM

## 2014-01-26 ENCOUNTER — Telehealth: Payer: Self-pay | Admitting: Internal Medicine

## 2014-01-26 ENCOUNTER — Encounter: Payer: Self-pay | Admitting: Family

## 2014-01-26 ENCOUNTER — Ambulatory Visit (INDEPENDENT_AMBULATORY_CARE_PROVIDER_SITE_OTHER): Payer: 59 | Admitting: Family

## 2014-01-26 VITALS — BP 168/84 | HR 80 | Temp 96.8°F | Ht 65.0 in

## 2014-01-26 DIAGNOSIS — I480 Paroxysmal atrial fibrillation: Secondary | ICD-10-CM

## 2014-01-26 DIAGNOSIS — I10 Essential (primary) hypertension: Secondary | ICD-10-CM

## 2014-01-26 DIAGNOSIS — F411 Generalized anxiety disorder: Secondary | ICD-10-CM

## 2014-01-26 MED ORDER — DILTIAZEM HCL ER COATED BEADS 120 MG PO TB24: 120.0000 mg | ORAL_TABLET | Freq: Every day | ORAL | Status: DC

## 2014-01-26 MED ORDER — ESCITALOPRAM OXALATE 10 MG PO TABS: 10.0000 mg | ORAL_TABLET | Freq: Every day | ORAL | Status: DC

## 2014-01-26 MED ORDER — ALPRAZOLAM 0.25 MG PO TABS: 0.2500 mg | ORAL_TABLET | Freq: Two times a day (BID) | ORAL | Status: DC | PRN

## 2014-01-26 NOTE — Progress Notes (Signed)
Subjective:    Patient ID: Kathryn Arnold, female    DOB: [birth year], 68 y.o.   MRN: [id number]  HPI Pt presents to the office for hypertension. Pt states she has taken her blood pressure at home and it was 186 92. Pt also has been dx with A Fib Sept 06, 2015 and takes flecainide as needed.   Since then pt states she has been having these spells of feeling "weak, shaking" and pt states her face becomes red and flushed. Pt states she feels her heart racing and a little SOB.    Review of Systems  Constitutional: Negative.   HENT: Negative.   Eyes: Negative.   Respiratory: Negative.  Negative for shortness of breath.   Cardiovascular: Negative.  Negative for palpitations.  Gastrointestinal: Negative.   Endocrine: Negative.   Genitourinary: Negative.   Musculoskeletal: Negative.   Neurological: Negative.  Negative for headaches.  Hematological: Negative.   Psychiatric/Behavioral: Negative.   All other systems reviewed and are negative.      Objective:   Physical Exam  Vitals reviewed. Constitutional: She is oriented to person, place, and time. She appears well-developed and well-nourished. No distress.  HENT:  Head: Normocephalic and atraumatic.  Right Ear: External ear normal.  Left Ear: External ear normal.  Nose: Nose normal.  Mouth/Throat: Oropharynx is clear and moist.  Eyes: Pupils are equal, round, and reactive to light.  Neck: Normal range of motion. Neck supple. No thyromegaly present.  Cardiovascular: Normal rate, regular rhythm, normal heart sounds and intact distal pulses.   No murmur heard. Pulmonary/Chest: Effort normal and breath sounds normal. No respiratory distress. She has no wheezes.  Abdominal: Soft. Bowel sounds are normal. She exhibits no distension. There is no tenderness.  Musculoskeletal: Normal range of motion. She exhibits no edema and no tenderness.  Neurological: She is alert and oriented to person, place, and time. She has normal reflexes. No  cranial nerve deficit.  Skin: Skin is warm and dry.  Psychiatric: She has a normal mood and affect. Her behavior is normal. Judgment and thought content normal.    BP 168/84  Pulse 80  Temp(Src) 96.8 F (36 C) (Oral)  Ht 5\' 5"  (1.651 m)       Assessment & Plan:  1. Essential hypertension, benign -Daily blood pressure log given with instructions on how to fill out and told to bring to next visit -Dash diet information given -Exercise encouraged - Stress Management  -Continue current meds- Pt to start on Cardizem LA 120 mg daily -Pt has appointment on Wednesday  2. Paroxysmal atrial fibrillation  3. GAD (generalized anxiety disorder) -Stress management -Pt to come back Wednesday - ALPRAZolam (XANAX) 0.25 MG tablet; Take 1 tablet (0.25 mg total) by mouth 2 (two) times daily as needed for anxiety.  Dispense: 60 tablet; Refill: 1 - escitalopram (LEXAPRO) 10 MG tablet; Take 1 tablet (10 mg total) by mouth daily.  Dispense: 90 tablet; Refill: 3   [name], FNP

## 2014-01-26 NOTE — Telephone Encounter (Signed)
Pt called and medication discussed. Pt to start new RX of Cardizem today.

## 2014-01-26 NOTE — Telephone Encounter (Signed)
NP and [facility] stopped Metoprolol and started her on Cardizem 120mg  daily.  She is very confused about her dosing of medications.  I have explained to her that the Flecainide(pill in the pocket) and Cardizem 60mg  are for her afib and the Cardizem 120mg  is for her BP.  She has not gotten the long acting Cardizem yet  She will pick it up tomorrow

## 2014-01-26 NOTE — Patient Instructions (Signed)
Generalized Anxiety Disorder Generalized anxiety disorder (GAD) is a mental disorder. It interferes with life functions, including relationships, work, and school. GAD is different from normal anxiety, which everyone experiences at some point in their lives in response to specific life events and activities. Normal anxiety actually helps us prepare for and get through these life events and activities. Normal anxiety goes away after the event or activity is over.  GAD causes anxiety that is not necessarily related to specific events or activities. It also causes excess anxiety in proportion to specific events or activities. The anxiety associated with GAD is also difficult to control. GAD can vary from mild to severe. People with severe GAD can have intense waves of anxiety with physical symptoms (panic attacks).  SYMPTOMS The anxiety and worry associated with GAD are difficult to control. This anxiety and worry are related to many life events and activities and also occur more days than not for 6 months or longer. People with GAD also have three or more of the following symptoms (one or more in children):  Restlessness.   Fatigue.  Difficulty concentrating.   Irritability.  Muscle tension.  Difficulty sleeping or unsatisfying sleep. DIAGNOSIS GAD is diagnosed through an assessment by your health care provider. Your health care provider will ask you questions aboutyour mood,physical symptoms, and events in your life. Your health care provider may ask you about your medical history and use of alcohol or drugs, including prescription medicines. Your health care provider may also do a physical exam and blood tests. Certain medical conditions and the use of certain substances can cause symptoms similar to those associated with GAD. Your health care provider may refer you to a mental health specialist for further evaluation. TREATMENT The following therapies are usually used to treat GAD:    Medication. Antidepressant medication usually is prescribed for long-term daily control. Antianxiety medicines may be added in severe cases, especially when panic attacks occur.   Talk therapy (psychotherapy). Certain types of talk therapy can be helpful in treating GAD by providing support, education, and guidance. A form of talk therapy called cognitive behavioral therapy can teach you healthy ways to think about and react to daily life events and activities.  Stress managementtechniques. These include yoga, meditation, and exercise and can be very helpful when they are practiced regularly. A mental health specialist can help determine which treatment is best for you. Some people see improvement with one therapy. However, other people require a combination of therapies. Document Released: 08/05/2012 Document Revised: 08/25/2013 Document Reviewed: 08/05/2012 ExitCare Patient Information 2015 [employer]. This information is not intended to replace advice given to you by your health care provider. Make sure you discuss any questions you have with your health care provider.  

## 2014-01-26 NOTE — Telephone Encounter (Signed)
New message     Pt is on cardizem and says that her bp is still going up.  Please advise

## 2014-01-26 NOTE — Telephone Encounter (Signed)
Azalee Courseshley Fulp, CMA at 01/23/2014 2:04 PM    Status: Signed       Patient aware.       Junie Spencerhristy A Hawks, FNP at 01/23/2014 1:52 PM     Status: Signed        Stop taking meoptolol and new RX sent to pharmacy to increase Cardizem to 120mg .         Azalee CourseAshley Fulp, CMA at 01/23/2014 11:49 AM     Status: Signed        Her blood pressure is running good and it but it physically makes her weak and like she is going to pass out from it would like a different blood pressure med.        Junie Spencerhristy A Hawks, FNP at 01/23/2014 11:21 AM     Status: Signed        Pt needs to take BP and pulse at home and let us know what the value is. Last visit pt's Blood pressure was not at goal, if pt can tolerate metoprolol then she needs to take it. PT needs to be on BP medication.

## 2014-01-27 ENCOUNTER — Ambulatory Visit: Payer: 59

## 2014-01-28 ENCOUNTER — Ambulatory Visit (INDEPENDENT_AMBULATORY_CARE_PROVIDER_SITE_OTHER): Payer: 59 | Admitting: Family

## 2014-01-28 ENCOUNTER — Encounter: Payer: Self-pay | Admitting: Family

## 2014-01-28 VITALS — BP 130/79 | HR 60 | Temp 98.2°F | Ht 65.0 in | Wt 155.4 lb

## 2014-01-28 DIAGNOSIS — Z23 Encounter for immunization: Secondary | ICD-10-CM

## 2014-01-28 DIAGNOSIS — I10 Essential (primary) hypertension: Secondary | ICD-10-CM

## 2014-01-28 DIAGNOSIS — F411 Generalized anxiety disorder: Secondary | ICD-10-CM | POA: Insufficient documentation

## 2014-01-28 NOTE — Progress Notes (Signed)
Subjective:    Patient ID: Kathryn Arnold, female    DOB: [birth year], 68 y.o.   MRN: [id number]  Hypertension Pertinent negatives include no headaches, palpitations or shortness of breath.   Pt presents to the office for follow-up on hypertension and GAD. PT's BP is at goal today and pt states her anxiety is better. Pt reports sleeping better and states "I feel like a new person". Pt denies any headache, pain, SOB, palpitations, or edema.    Review of Systems  Constitutional: Negative.   HENT: Negative.   Eyes: Negative.   Respiratory: Negative.  Negative for shortness of breath.   Cardiovascular: Negative.  Negative for palpitations.  Gastrointestinal: Negative.   Endocrine: Negative.   Genitourinary: Negative.   Musculoskeletal: Negative.   Neurological: Negative.  Negative for headaches.  Hematological: Negative.   Psychiatric/Behavioral: Negative.   All other systems reviewed and are negative.      Objective:   Physical Exam  Vitals reviewed. Constitutional: She is oriented to person, place, and time. She appears well-developed and well-nourished. No distress.  HENT:  Head: Normocephalic and atraumatic.  Right Ear: External ear normal.  Mouth/Throat: Oropharynx is clear and moist.  Eyes: Pupils are equal, round, and reactive to light.  Neck: Normal range of motion. Neck supple. No thyromegaly present.  Cardiovascular: Normal rate, regular rhythm, normal heart sounds and intact distal pulses.   No murmur heard. Pulmonary/Chest: Effort normal and breath sounds normal. No respiratory distress. She has no wheezes.  Abdominal: Soft. Bowel sounds are normal. She exhibits no distension. There is no tenderness.  Musculoskeletal: Normal range of motion. She exhibits no edema and no tenderness.  Neurological: She is alert and oriented to person, place, and time. She has normal reflexes. No cranial nerve deficit.  Skin: Skin is warm and dry.  Psychiatric: She has a normal mood  and affect. Her behavior is normal. Judgment and thought content normal.    BP 130/79  Pulse 60  Temp(Src) 98.2 F (36.8 C) (Oral)  Ht 5\' 5"  (1.651 m)  Wt 155 lb 6.4 oz (70.489 kg)  BMI 25.86 kg/m2       Assessment & Plan:  1. Encounter for immunization  2. Essential hypertension, benign -Dash diet information discussed  -Exercise encouraged - Stress Management  -Continue current meds -RTO in 6 months for chronic f/U  3. GAD (generalized anxiety disorder) -Stress management   Continue all meds Health Maintenance reviewed-Flu shot given today Diet and exercise encouraged RTO 6 months for chronic follow-up  [name], FNP

## 2014-01-28 NOTE — Patient Instructions (Signed)

## 2014-02-10 ENCOUNTER — Telehealth: Payer: Self-pay | Admitting: Nurse Practitioner

## 2014-02-10 DIAGNOSIS — F411 Generalized anxiety disorder: Secondary | ICD-10-CM

## 2014-02-11 ENCOUNTER — Other Ambulatory Visit: Payer: Self-pay | Admitting: *Deleted

## 2014-02-11 DIAGNOSIS — F411 Generalized anxiety disorder: Secondary | ICD-10-CM

## 2014-02-11 MED ORDER — DILTIAZEM HCL ER COATED BEADS 120 MG PO TB24: 120.0000 mg | ORAL_TABLET | Freq: Every day | ORAL | Status: DC

## 2014-02-11 MED ORDER — ALPRAZOLAM 0.25 MG PO TABS: 0.2500 mg | ORAL_TABLET | Freq: Two times a day (BID) | ORAL | Status: DC | PRN

## 2014-02-11 MED ORDER — ESCITALOPRAM OXALATE 10 MG PO TABS: 10.0000 mg | ORAL_TABLET | Freq: Every day | ORAL | Status: DC

## 2014-02-11 NOTE — Telephone Encounter (Signed)
[facility]. Can alprazolam rx be printed and signed so patient can send in to mail order. Saw Christy last

## 2014-02-11 NOTE — Telephone Encounter (Signed)
rx ready for pickup 

## 2014-02-11 NOTE — Telephone Encounter (Signed)
Aware ,script ready. 

## 2014-02-23 ENCOUNTER — Telehealth: Payer: Self-pay | Admitting: Nurse Practitioner

## 2014-02-23 ENCOUNTER — Encounter: Payer: Self-pay | Admitting: Family

## 2014-02-23 DIAGNOSIS — F411 Generalized anxiety disorder: Secondary | ICD-10-CM

## 2014-02-23 MED ORDER — DILTIAZEM HCL ER COATED BEADS 120 MG PO TB24: 120.0000 mg | ORAL_TABLET | Freq: Every day | ORAL | Status: DC

## 2014-02-23 MED ORDER — ESCITALOPRAM OXALATE 10 MG PO TABS: 10.0000 mg | ORAL_TABLET | Freq: Every day | ORAL | Status: DC

## 2014-02-23 NOTE — Telephone Encounter (Signed)
rx sent to pharmacy

## 2014-03-06 ENCOUNTER — Other Ambulatory Visit: Payer: Self-pay | Admitting: Nurse Practitioner

## 2014-03-31 ENCOUNTER — Encounter (HOSPITAL_COMMUNITY): Payer: Self-pay | Admitting: Nurse Practitioner

## 2014-03-31 ENCOUNTER — Ambulatory Visit (HOSPITAL_COMMUNITY)
Admission: RE | Admit: 2014-03-31 | Discharge: 2014-03-31 | Disposition: A | Discharge status: Home / Self Care | Payer: 59 | Source: Ambulatory Visit | Attending: Nurse Practitioner | Admitting: Nurse Practitioner

## 2014-03-31 VITALS — BP 124/82 | HR 51 | Resp 18 | Wt 149.4 lb

## 2014-03-31 DIAGNOSIS — I480 Paroxysmal atrial fibrillation: Secondary | ICD-10-CM

## 2014-03-31 DIAGNOSIS — E780 Pure hypercholesterolemia: Secondary | ICD-10-CM | POA: Diagnosis not present

## 2014-03-31 DIAGNOSIS — E559 Vitamin D deficiency, unspecified: Secondary | ICD-10-CM | POA: Insufficient documentation

## 2014-03-31 DIAGNOSIS — Z803 Family history of malignant neoplasm of breast: Secondary | ICD-10-CM | POA: Insufficient documentation

## 2014-03-31 DIAGNOSIS — R001 Bradycardia, unspecified: Secondary | ICD-10-CM | POA: Insufficient documentation

## 2014-03-31 DIAGNOSIS — E785 Hyperlipidemia, unspecified: Secondary | ICD-10-CM | POA: Diagnosis not present

## 2014-03-31 DIAGNOSIS — M810 Age-related osteoporosis without current pathological fracture: Secondary | ICD-10-CM | POA: Diagnosis not present

## 2014-03-31 DIAGNOSIS — Z8249 Family history of ischemic heart disease and other diseases of the circulatory system: Secondary | ICD-10-CM | POA: Insufficient documentation

## 2014-03-31 DIAGNOSIS — Z8261 Family history of arthritis: Secondary | ICD-10-CM | POA: Diagnosis not present

## 2014-03-31 DIAGNOSIS — Z79899 Other long term (current) drug therapy: Secondary | ICD-10-CM | POA: Diagnosis not present

## 2014-03-31 NOTE — Patient Instructions (Signed)
Follow up with your primary care provider on January 18th as scheduled.  Please feel free to call us if needed!  Merry Christmas!

## 2014-03-31 NOTE — Progress Notes (Signed)
Primary Care Physician: Rudi HeapMOORE, DONALD, MD/Christ A. Lendon ColonelHawks, FNP Electrophysiologist: [name] Referring Physician:ED F/U   Kathryn Arnold is a 68 y.o. female with a h/o PAF that presented to [facility] in September of this year after developing rapid heart beat and was diagnosed with new onset afib. She converted with Flecainide po. She saw [name] f/u and had thyroid studies, cbc, bmet which  were normal. Echo with normal systolic function, mild MR and mod TR.   She reports no further episodes of afib. Has Cardizem 60 mg as well as fecainide 150 mg x2  As a  "pill in the pocket". She believes her stress level was the trigger for afib and is working on that. Does not do any regular exercise but is going to try to get back in the habit. She does not drink alcohol, non smoker, no snoring history. Was started on Cardizem 120 mg in October for Blood Pressure which is well controlled today.She has noticed some fatigue since staring this. Did not tolerate BB in past due to bradycardia. Tolerating blood thinners without bleeding issues. Chadsvasc score of 2.   Today, she  Is positive for  a rare palpitation, mild fatigue, no chest pain, shortness of breath, orthopnea, PND, lower extremity edema, dizziness, presyncope, syncope, or neurologic sequela. The patient is tolerating medications without difficulties and is otherwise without complaint today.   Past Medical History  Diagnosis Date  . Abnormal Pap smear   . 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 Complication of anesthesia     alittle slow to wake up-stayed drowsy  . Hypercholesteremia   . Osteoporosis, unspecified     Last DEXA 10/2011   . Hyperlipidemia with target LDL less than 100   . Vitamin D deficiency   . HSV-2 (herpes simplex virus 2) infection   . Paroxysmal atrial fibrillation    Past Surgical History  Procedure Laterality Date  . Breast lumpectomy    . Dilation and curettage of uterus    . Laparoscopy abdomen  diagnostic      Current Outpatient Prescriptions  Medication Sig Dispense Refill  . alendronate (FOSAMAX) 70 MG tablet Take 1 tablet (70 mg total) by mouth every 7 (seven) days. Take with a full glass of water on an empty stomach. 12 tablet 3  . ALPRAZolam (XANAX) 0.25 MG tablet Take 1 tablet (0.25 mg total) by mouth 2 (two) times daily as needed for anxiety. 180 tablet 0  . Calcium Carbonate (CALCIUM 600 PO) Take 1 tablet by mouth 2 (two) times daily.    . Cholecalciferol (VITAMIN D-3) 1000 UNITS CAPS Take 1 capsule by mouth daily.    . Coenzyme Q10 (CO Q 10) 100 MG CAPS Take 1 capsule by mouth daily.    [name]. diltiazem (CARDIZEM LA) 120 MG 24 hr tablet Take 1 tablet (120 mg total) by mouth daily. 30 tablet 0  . escitalopram (LEXAPRO) 10 MG tablet Take 1 tablet (10 mg total) by mouth daily. 30 tablet 0  . Magnesium 250 MG TABS Take 2 tablets by mouth daily.    . Multiple Vitamins-Minerals (CENTRUM SILVER ULTRA WOMENS PO) Take 1 capsule by mouth daily.     . rivaroxaban (XARELTO) 20 MG TABS tablet Take 1 tablet (20 mg total) by mouth daily with supper. 30 tablet 11  . rosuvastatin (CRESTOR) 20 MG tablet Take 1 tablet (20 mg total) by mouth daily. 90 tablet 3  . Selenium 200 MCG CAPS Take 1  capsule by mouth daily.    . valACYclovir (VALTREX) 500 MG tablet Take 1 tablet by mouth  daily 90 tablet 1  . VITAMIN A PO Take 8,000 Units by mouth daily.     . vitamin B-12 (CYANOCOBALAMIN) 1000 MCG tablet Take 1,000 mcg by mouth daily.    . vitamin C (ASCORBIC ACID) 500 MG tablet Take 500 mg by mouth 4 (four) times daily.     . Zinc 50 MG CAPS Take 1 capsule by mouth daily.    . flecainide (TAMBOCOR) 150 MG tablet Take 2 tablets by mouth at onset of afib (Patient not taking: Reported on 03/31/2014) 20 tablet 1   No current facility-administered medications for this encounter.    No Known Allergies  History   Social History  . Marital Status: Married    Spouse Name: N/A    Number of Children: N/A    . Years of Education: N/A   Occupational History  . Not on file.   Social History Main Topics  . Smoking status: Never Smoker   . Smokeless tobacco: Not on file  . Alcohol Use: No  . Drug Use: No  . Sexual Activity: Yes    Birth Control/ Protection: None   Other Topics Concern  . Not on file   Social History Narrative   Lives in [place] with spouse.    Family History  Problem Relation Age of Onset  . Other Neg Hx   . Osteoporosis Mother   . Cancer Mother     breast  . Hypertension Mother   . Heart disease Father     ROS- per HPI as above.  Physical Exam: Filed Vitals:   03/31/14 1303  BP: 124/82  Pulse: 51  Resp: 18  Weight: 149 lb 6 oz (67.756 kg)  SpO2: 98%    GEN- The patient is well appearing, alert and oriented x 3 today.   Head- normocephalic, atraumatic Eyes-  Sclera clear, conjunctiva pink Ears- hearing intact Oropharynx- clear Neck- supple, no JVP Lymph- no cervical lymphadenopathy Lungs- Clear to ausculation bilaterally, normal work of breathing Heart- Regular rate and rhythm, no murmurs, rubs or gallops, PMI not laterally displaced GI- soft, NT, ND, + BS Extremities- no clubbing, cyanosis, or edema MS- no significant deformity or atrophy Skin- no rash or lesion Psych- euthymic mood, full affect Neuro- strength and sensation are intact  EKG today reveals sinus brady at 51 bpm, IRBBB, unchanged from previous. Epic records including recent ER visit, EKG from previous, labs, echo, pcp note reviewed.  Assessment and Plan:  1. Paroxysmal atrial fibrillation Does not sound like pt is having any significant recurrent afib. On Cardizem 120 mg a day for BP/h/o afib. Asymptomatic bradycardia, but if becomes more bradycardic in future, may need another BP pill without chronotrophic effect and use pill in pocket approach.   If she has afib, she will take cardizem 60mg  po first, If afib persists after 30 minutes, she will take flecainide 300mg  po x  1  F/u with PCP as scheduled in January and with Dr. Cletis MediaAllred/afib clinic as needed.

## 2014-04-01 NOTE — Addendum Note (Signed)
Encounter addended by: Deitra MayoBeverly H Shaylon Gillean, CCT on: 04/01/2014 10:55 AM<BR>     Documentation filed: Charges VN

## 2014-05-11 ENCOUNTER — Encounter: Payer: Self-pay | Admitting: Family

## 2014-05-11 ENCOUNTER — Ambulatory Visit (INDEPENDENT_AMBULATORY_CARE_PROVIDER_SITE_OTHER): Payer: 59 | Admitting: Family

## 2014-05-11 VITALS — BP 138/88 | HR 57 | Temp 97.7°F | Ht 65.0 in | Wt 148.2 lb

## 2014-05-11 DIAGNOSIS — I10 Essential (primary) hypertension: Secondary | ICD-10-CM

## 2014-05-11 DIAGNOSIS — E785 Hyperlipidemia, unspecified: Secondary | ICD-10-CM

## 2014-05-11 DIAGNOSIS — J069 Acute upper respiratory infection, unspecified: Secondary | ICD-10-CM

## 2014-05-11 DIAGNOSIS — R5383 Other fatigue: Secondary | ICD-10-CM

## 2014-05-11 DIAGNOSIS — I480 Paroxysmal atrial fibrillation: Secondary | ICD-10-CM

## 2014-05-11 DIAGNOSIS — R059 Cough, unspecified: Secondary | ICD-10-CM

## 2014-05-11 DIAGNOSIS — E559 Vitamin D deficiency, unspecified: Secondary | ICD-10-CM

## 2014-05-11 DIAGNOSIS — R05 Cough: Secondary | ICD-10-CM

## 2014-05-11 DIAGNOSIS — M810 Age-related osteoporosis without current pathological fracture: Secondary | ICD-10-CM

## 2014-05-11 DIAGNOSIS — F411 Generalized anxiety disorder: Secondary | ICD-10-CM

## 2014-05-11 MED ORDER — BENZONATATE 200 MG PO CAPS
200.0000 mg | ORAL_CAPSULE | Freq: Three times a day (TID) | ORAL | Status: DC | PRN
Start: 2014-05-11 — End: 2014-06-25

## 2014-05-11 MED ORDER — VALACYCLOVIR HCL 500 MG PO TABS: ORAL_TABLET | ORAL | Status: DC

## 2014-05-11 MED ORDER — ALPRAZOLAM 0.25 MG PO TABS: 0.2500 mg | ORAL_TABLET | Freq: Two times a day (BID) | ORAL | Status: DC | PRN

## 2014-05-11 MED ORDER — AZITHROMYCIN 250 MG PO TABS: ORAL_TABLET | ORAL | Status: DC

## 2014-05-11 MED ORDER — BENZONATATE 200 MG PO CAPS: 200.0000 mg | ORAL_CAPSULE | Freq: Three times a day (TID) | ORAL | Status: DC | PRN

## 2014-05-11 NOTE — Patient Instructions (Signed)
Upper Respiratory Infection, Adult An upper respiratory infection (URI) is also sometimes known as the common cold. The upper respiratory tract includes the nose, sinuses, throat, trachea, and bronchi. Bronchi are the airways leading to the lungs. Most people improve within 1 week, but symptoms can last up to 2 weeks. A residual cough may last even longer.  CAUSES Many different viruses can infect the tissues lining the upper respiratory tract. The tissues become irritated and inflamed and often become very moist. Mucus production is also common. A cold is contagious. You can easily spread the virus to others by oral contact. This includes kissing, sharing a glass, coughing, or sneezing. Touching your mouth or nose and then touching a surface, which is then touched by another person, can also spread the virus. SYMPTOMS  Symptoms typically develop 1 to 3 days after you come in contact with a cold virus. Symptoms vary from person to person. They may include:  Runny nose.  Sneezing.  Nasal congestion.  Sinus irritation.  Sore throat.  Loss of voice (laryngitis).  Cough.  Fatigue.  Muscle aches.  Loss of appetite.  Headache.  Low-grade fever. DIAGNOSIS  You might diagnose your own cold based on familiar symptoms, since most people get a cold 2 to 3 times a year. Your caregiver can confirm this based on your exam. Most importantly, your caregiver can check that your symptoms are not due to another disease such as strep throat, sinusitis, pneumonia, asthma, or epiglottitis. Blood tests, throat tests, and X-rays are not necessary to diagnose a common cold, but they may sometimes be helpful in excluding other more serious diseases. Your caregiver will decide if any further tests are required. RISKS AND COMPLICATIONS  You may be at risk for a more severe case of the common cold if you smoke cigarettes, have chronic heart disease (such as heart failure) or lung disease (such as asthma), or if  you have a weakened immune system. The very young and very old are also at risk for more serious infections. Bacterial sinusitis, middle ear infections, and bacterial pneumonia can complicate the common cold. The common cold can worsen asthma and chronic obstructive pulmonary disease (COPD). Sometimes, these complications can require emergency medical care and may be life-threatening. PREVENTION  The best way to protect against getting a cold is to practice good hygiene. Avoid oral or hand contact with people with cold symptoms. Wash your hands often if contact occurs. There is no clear evidence that vitamin C, vitamin E, echinacea, or exercise reduces the chance of developing a cold. However, it is always recommended to get plenty of rest and practice good nutrition. TREATMENT  Treatment is directed at relieving symptoms. There is no cure. Antibiotics are not effective, because the infection is caused by a virus, not by bacteria. Treatment may include:  Increased fluid intake. Sports drinks offer valuable electrolytes, sugars, and fluids.  Breathing heated mist or steam (vaporizer or shower).  Eating chicken soup or other clear broths, and maintaining good nutrition.  Getting plenty of rest.  Using gargles or lozenges for comfort.  Controlling fevers with ibuprofen or acetaminophen as directed by your caregiver.  Increasing usage of your inhaler if you have asthma. Zinc gel and zinc lozenges, taken in the first 24 hours of the common cold, can shorten the duration and lessen the severity of symptoms. Pain medicines may help with fever, muscle aches, and throat pain. A variety of non-prescription medicines are available to treat congestion and runny nose. Your caregiver   can make recommendations and may suggest nasal or lung inhalers for other symptoms.  HOME CARE INSTRUCTIONS   Only take over-the-counter or prescription medicines for pain, discomfort, or fever as directed by your  caregiver.  Use a warm mist humidifier or inhale steam from a shower to increase air moisture. This may keep secretions moist and make it easier to breathe.  Drink enough water and fluids to keep your urine clear or pale yellow.  Rest as needed.  Return to work when your temperature has returned to normal or as your caregiver advises. You may need to stay home longer to avoid infecting others. You can also use a face mask and careful hand washing to prevent spread of the virus. SEEK MEDICAL CARE IF:   After the first few days, you feel you are getting worse rather than better.  You need your caregiver's advice about medicines to control symptoms.  You develop chills, worsening shortness of breath, or brown or red sputum. These may be signs of pneumonia.  You develop yellow or brown nasal discharge or pain in the face, especially when you bend forward. These may be signs of sinusitis.  You develop a fever, swollen neck glands, pain with swallowing, or white areas in the back of your throat. These may be signs of strep throat. SEEK IMMEDIATE MEDICAL CARE IF:   You have a fever.  You develop severe or persistent headache, ear pain, sinus pain, or chest pain.  You develop wheezing, a prolonged cough, cough up blood, or have a change in your usual mucus (if you have chronic lung disease).  You develop sore muscles or a stiff neck. Document Released: 10/04/2000 Document Revised: 07/03/2011 Document Reviewed: 07/16/2013 [facility] Patient Information 2015 [place], [place]. This information is not intended to replace advice given to you by your health care provider. Make sure you discuss any questions you have with your health care provider. Health Maintenance Adopting a healthy lifestyle and getting preventive care can go a long way to promote health and wellness. Talk with your health care provider about what schedule of regular examinations is right for you. This is a good chance for you to  check in with your provider about disease prevention and staying healthy. In between checkups, there are plenty of things you can do on your own. Experts have done a lot of research about which lifestyle changes and preventive measures are most likely to keep you healthy. Ask your health care provider for more information. WEIGHT AND DIET  Eat a healthy diet  Be sure to include plenty of vegetables, fruits, low-fat dairy products, and lean protein.  Do not eat a lot of foods high in solid fats, added sugars, or salt.  Get regular exercise. This is one of the most important things you can do for your health.  Most adults should exercise for at least 150 minutes each week. The exercise should increase your heart rate and make you sweat (moderate-intensity exercise).  Most adults should also do strengthening exercises at least twice a week. This is in addition to the moderate-intensity exercise.  Maintain a healthy weight  Body mass index (BMI) is a measurement that can be used to identify possible weight problems. It estimates body fat based on height and weight. Your health care provider can help determine your BMI and help you achieve or maintain a healthy weight.  For females 11 years of age and older:   A BMI below 18.5 is considered underweight.  A BMI  of 18.5 to 24.9 is normal.  A BMI of 25 to 29.9 is considered overweight.  A BMI of 30 and above is considered obese.  Watch levels of cholesterol and blood lipids  You should start having your blood tested for lipids and cholesterol at 67 years of age, then have this test every 5 years.  You may need to have your cholesterol levels checked more often if:  Your lipid or cholesterol levels are high.  You are older than 67 years of age.  You are at high risk for heart disease.  CANCER SCREENING   Lung Cancer  Lung cancer screening is recommended for adults 47-74 years old who are at high risk for lung cancer because of a  history of smoking.  A yearly low-dose CT scan of the lungs is recommended for people who:  Currently smoke.  Have quit within the past 15 years.  Have at least a 30-pack-year history of smoking. A pack year is smoking an average of one pack of cigarettes a day for 1 year.  Yearly screening should continue until it has been 15 years since you quit.  Yearly screening should stop if you develop a health problem that would prevent you from having lung cancer treatment.  Breast Cancer  Practice breast self-awareness. This means understanding how your breasts normally appear and feel.  It also means doing regular breast self-exams. Let your health care provider know about any changes, no matter how small.  If you are in your 20s or 30s, you should have a clinical breast exam (CBE) by a health care provider every 1-3 years as part of a regular health exam.  If you are 62 or older, have a CBE every year. Also consider having a breast X-ray (mammogram) every year.  If you have a family history of breast cancer, talk to your health care provider about genetic screening.  If you are at high risk for breast cancer, talk to your health care provider about having an MRI and a mammogram every year.  Breast cancer gene (BRCA) assessment is recommended for women who have family members with BRCA-related cancers. BRCA-related cancers include:  Breast.  Ovarian.  Tubal.  Peritoneal cancers.  Results of the assessment will determine the need for genetic counseling and BRCA1 and BRCA2 testing. Cervical Cancer Routine pelvic examinations to screen for cervical cancer are no longer recommended for nonpregnant women who are considered low risk for cancer of the pelvic organs (ovaries, uterus, and vagina) and who do not have symptoms. A pelvic examination may be necessary if you have symptoms including those associated with pelvic infections. Ask your health care provider if a screening pelvic exam  is right for you.   The Pap test is the screening test for cervical cancer for women who are considered at risk.  If you had a hysterectomy for a problem that was not cancer or a condition that could lead to cancer, then you no longer need Pap tests.  If you are older than 65 years, and you have had normal Pap tests for the past 10 years, you no longer need to have Pap tests.  If you have had past treatment for cervical cancer or a condition that could lead to cancer, you need Pap tests and screening for cancer for at least 20 years after your treatment.  If you no longer get a Pap test, assess your risk factors if they change (such as having a new sexual partner). This can affect  whether you should start being screened again.  Some women have medical problems that increase their chance of getting cervical cancer. If this is the case for you, your health care provider may recommend more frequent screening and Pap tests.  The human papillomavirus (HPV) test is another test that may be used for cervical cancer screening. The HPV test looks for the virus that can cause cell changes in the cervix. The cells collected during the Pap test can be tested for HPV.  The HPV test can be used to screen women 50 years of age and older. Getting tested for HPV can extend the interval between normal Pap tests from three to five years.  An HPV test also should be used to screen women of any age who have unclear Pap test results.  After 67 years of age, women should have HPV testing as often as Pap tests.  Colorectal Cancer  This type of cancer can be detected and often prevented.  Routine colorectal cancer screening usually begins at 67 years of age and continues through 67 years of age.  Your health care provider may recommend screening at an earlier age if you have risk factors for colon cancer.  Your health care provider may also recommend using home test kits to check for hidden blood in the  stool.  A small camera at the end of a tube can be used to examine your colon directly (sigmoidoscopy or colonoscopy). This is done to check for the earliest forms of colorectal cancer.  Routine screening usually begins at age 47.  Direct examination of the colon should be repeated every 5-10 years through 67 years of age. However, you may need to be screened more often if early forms of precancerous polyps or small growths are found. Skin Cancer  Check your skin from head to toe regularly.  Tell your health care provider about any new moles or changes in moles, especially if there is a change in a mole's shape or color.  Also tell your health care provider if you have a mole that is larger than the size of a pencil eraser.  Always use sunscreen. Apply sunscreen liberally and repeatedly throughout the day.  Protect yourself by wearing long sleeves, pants, a wide-brimmed hat, and sunglasses whenever you are outside. HEART DISEASE, DIABETES, AND HIGH BLOOD PRESSURE   Have your blood pressure checked at least every 1-2 years. High blood pressure causes heart disease and increases the risk of stroke.  If you are between 67 years and 52 years old, ask your health care provider if you should take aspirin to prevent strokes.  Have regular diabetes screenings. This involves taking a blood sample to check your fasting blood sugar level.  If you are at a normal weight and have a low risk for diabetes, have this test once every three years after 67 years of age.  If you are overweight and have a high risk for diabetes, consider being tested at a younger age or more often. PREVENTING INFECTION  Hepatitis B  If you have a higher risk for hepatitis B, you should be screened for this virus. You are considered at high risk for hepatitis B if:  You were born in a country where hepatitis B is common. Ask your health care provider which countries are considered high risk.  Your parents were born in  a high-risk country, and you have not been immunized against hepatitis B (hepatitis B vaccine).  You have HIV or AIDS.  You  use needles to inject street drugs.  You live with someone who has hepatitis B.  You have had sex with someone who has hepatitis B.  You get hemodialysis treatment.  You take certain medicines for conditions, including cancer, organ transplantation, and autoimmune conditions. Hepatitis C  Blood testing is recommended for:  Everyone born from [birth year] through 1963.  Anyone with known risk factors for hepatitis C. Sexually transmitted infections (STIs)  You should be screened for sexually transmitted infections (STIs) including gonorrhea and chlamydia if:  You are sexually active and are younger than 67 years of age.  You are older than 67 years of age and your health care provider tells you that you are at risk for this type of infection.  Your sexual activity has changed since you were last screened and you are at an increased risk for chlamydia or gonorrhea. Ask your health care provider if you are at risk.  If you do not have HIV, but are at risk, it may be recommended that you take a prescription medicine daily to prevent HIV infection. This is called pre-exposure prophylaxis (PrEP). You are considered at risk if:  You are sexually active and do not regularly use condoms or know the HIV status of your partner(s).  You take drugs by injection.  You are sexually active with a partner who has HIV. Talk with your health care provider about whether you are at high risk of being infected with HIV. If you choose to begin PrEP, you should first be tested for HIV. You should then be tested every 3 months for as long as you are taking PrEP.  PREGNANCY   If you are premenopausal and you may become pregnant, ask your health care provider about preconception counseling.  If you may become pregnant, take 400 to 800 micrograms (mcg) of folic acid every day.  If you  want to prevent pregnancy, talk to your health care provider about birth control (contraception). OSTEOPOROSIS AND MENOPAUSE   Osteoporosis is a disease in which the bones lose minerals and strength with aging. This can result in serious bone fractures. Your risk for osteoporosis can be identified using a bone density scan.  If you are 60 years of age or older, or if you are at risk for osteoporosis and fractures, ask your health care provider if you should be screened.  Ask your health care provider whether you should take a calcium or vitamin D supplement to lower your risk for osteoporosis.  Menopause may have certain physical symptoms and risks.  Hormone replacement therapy may reduce some of these symptoms and risks. Talk to your health care provider about whether hormone replacement therapy is right for you.  HOME CARE INSTRUCTIONS   Schedule regular health, dental, and eye exams.  Stay current with your immunizations.   Do not use any tobacco products including cigarettes, chewing tobacco, or electronic cigarettes.  If you are pregnant, do not drink alcohol.  If you are breastfeeding, limit how much and how often you drink alcohol.  Limit alcohol intake to no more than 1 drink per day for nonpregnant women. One drink equals 12 ounces of beer, 5 ounces of wine, or 1 ounces of hard liquor.  Do not use street drugs.  Do not share needles.  Ask your health care provider for help if you need support or information about quitting drugs.  Tell your health care provider if you often feel depressed.  Tell your health care provider if you have ever  been abused or do not feel safe at home. Document Released: 10/24/2010 Document Revised: 08/25/2013 Document Reviewed: 03/12/2013 [facility] Patient Information 2015 [place], [place]. This information is not intended to replace advice given to you by your health care provider. Make sure you discuss any questions you have with your health  care provider.

## 2014-05-11 NOTE — Progress Notes (Signed)
Subjective:    Patient ID: Kathryn Arnold, female    DOB: Mar 20, 1946, 69 y.o.   MRN: [id number]  Hypertension This is a chronic problem. The current episode started more than 1 year ago. The problem has been waxing and waning since onset. The problem is uncontrolled. Associated symptoms include anxiety and palpitations ("every now and then"). Pertinent negatives include no headaches, peripheral edema or shortness of breath. Risk factors for coronary artery disease include dyslipidemia, post-menopausal state and family history. Past treatments include beta blockers. The current treatment provides mild improvement. There is no history of kidney disease, CAD/MI, CVA, heart failure or a thyroid problem. There is no history of sleep apnea.  Hyperlipidemia This is a chronic problem. The current episode started more than 1 year ago. Recent lipid tests were reviewed and are normal. She has no history of diabetes or hypothyroidism. Pertinent negatives include no leg pain, myalgias or shortness of breath. Current antihyperlipidemic treatment includes statins. The current treatment provides moderate improvement of lipids. Risk factors for coronary artery disease include dyslipidemia, family history, hypertension and post-menopausal.  Anxiety Presents for follow-up visit. The problem has been gradually improving. Symptoms include palpitations ("every now and then"). Patient reports no decreased concentration, depressed mood, excessive worry, irritability, nervous/anxious behavior or shortness of breath. Symptoms occur rarely.   Her past medical history is significant for anxiety/panic attacks and depression. There is no history of CAD. Past treatments include SSRIs and benzodiazephines.  URI  This is a new problem. The current episode started in the past 7 days. The problem has been unchanged. There has been no fever. Associated symptoms include coughing, rhinorrhea and sneezing. Pertinent negatives include no  headaches or sinus pain. She has tried sleep and decongestant for the symptoms. The treatment provided mild relief.      Review of Systems  Constitutional: Negative.  Negative for irritability.  HENT: Positive for rhinorrhea and sneezing.   Eyes: Negative.   Respiratory: Positive for cough. Negative for shortness of breath.   Cardiovascular: Positive for palpitations ("every now and then").  Gastrointestinal: Negative.   Endocrine: Negative.   Genitourinary: Negative.   Musculoskeletal: Negative.  Negative for myalgias.  Neurological: Negative.  Negative for headaches.  Hematological: Negative.   Psychiatric/Behavioral: Negative.  Negative for decreased concentration. The patient is not nervous/anxious.   All other systems reviewed and are negative.      Objective:   Physical Exam  Constitutional: She is oriented to person, place, and time. She appears well-developed and well-nourished. No distress.  HENT:  Head: Normocephalic and atraumatic.  Right Ear: External ear normal.  Eyes: Pupils are equal, round, and reactive to light.  Neck: Normal range of motion. Neck supple. No thyromegaly present.  Cardiovascular: Normal rate, regular rhythm, normal heart sounds and intact distal pulses.   No murmur heard. Pulmonary/Chest: Effort normal and breath sounds normal. No respiratory distress. She has no wheezes.  Abdominal: Soft. Bowel sounds are normal. She exhibits no distension. There is no tenderness.  Musculoskeletal: Normal range of motion. She exhibits no edema or tenderness.  Neurological: She is alert and oriented to person, place, and time. She has normal reflexes. No cranial nerve deficit.  Skin: Skin is warm and dry.  Psychiatric: She has a normal mood and affect. Her behavior is normal. Judgment and thought content normal.  Vitals reviewed.  BP 138/88 mmHg  Pulse 57  Temp(Src) 97.7 F (36.5 C) (Oral)  Ht '5\' 5"'  (1.651 m)  Wt 148 lb 3.2 oz (  67.223 kg)  BMI 24.66  kg/m2        Assessment & Plan:  1. GAD (generalized anxiety disorder) - ALPRAZolam (XANAX) 0.25 MG tablet; Take 1 tablet (0.25 mg total) by mouth 2 (two) times daily as needed for anxiety.  Dispense: 180 tablet; Refill: 0 - CMP14+EGFR  2. Essential hypertension, benign - CMP14+EGFR  3. Osteoporosis - CMP14+EGFR  4. Vitamin D deficiency - CMP14+EGFR - Vit D  25 hydroxy (rtn osteoporosis monitoring)  5. Hyperlipidemia with target LDL less than 100 - CMP14+EGFR - Lipid panel  6. Paroxysmal atrial fibrillation - CMP14+EGFR  7. Other fatigue - Vit D  25 hydroxy (rtn osteoporosis monitoring) - Anemia Profile B  8. Cough - azithromycin (ZITHROMAX Z-PAK) 250 MG tablet; Take 500 mg once, then 250 mg for 4 days  Dispense: 6 each; Refill: 0 - benzonatate (TESSALON) 200 MG capsule; Take 1 capsule (200 mg total) by mouth 3 (three) times daily as needed.  Dispense: 30 capsule; Refill: 1  9. Acute upper respiratory infection -- Take meds as prescribed - Use a cool mist humidifier  -Use saline nose sprays frequently -Saline irrigations of the nose can be very helpful if done frequently.  * 4X daily for 1 week*  * Use of a nettie pot can be helpful with this. Follow directions with this* -Force fluids -For any cough or congestion  Use plain Mucinex- regular strength or max strength is fine   * Children- consult with Pharmacist for dosing -For fever or aces or pains- take tylenol or ibuprofen appropriate for age and weight.  * for fevers greater than 101 orally you may alternate ibuprofen and tylenol every  3 hours. -Throat lozenges if help   Evelina Dun, FNP   azithromycin (ZITHROMAX Z-PAK) 250 MG tablet; Take 500 mg once, then 250 mg for 4 days  Dispense: 6 each; Refill: 0   Continue all meds Labs pending Health Maintenance reviewed- Pt to get bone density scan at gyn in fall Diet and exercise encouraged RTO 6 months  Evelina Dun, FNP

## 2014-05-12 LAB — CMP14+EGFR
A/G RATIO: 1.8 (ref 1.1–2.5)
ALT: 12 IU/L (ref 0–32)
AST: 22 IU/L (ref 0–40)
Albumin: 4.5 g/dL (ref 3.6–4.8)
Alkaline Phosphatase: 56 IU/L (ref 39–117)
BILIRUBIN TOTAL: 0.3 mg/dL (ref 0.0–1.2)
BUN / CREAT RATIO: 22 (ref 11–26)
BUN: 16 mg/dL (ref 8–27)
CALCIUM: 9.6 mg/dL (ref 8.7–10.3)
CHLORIDE: 98 mmol/L (ref 97–108)
CO2: 25 mmol/L (ref 18–29)
Creatinine, Ser: 0.74 mg/dL (ref 0.57–1.00)
GFR, EST AFRICAN AMERICAN: 98 mL/min/{1.73_m2} (ref >59)
GFR, EST NON AFRICAN AMERICAN: 85 mL/min/{1.73_m2} (ref >59)
Globulin, Total: 2.5 g/dL (ref 1.5–4.5)
Glucose: 91 mg/dL (ref 65–99)
Potassium: 4.1 mmol/L (ref 3.5–5.2)
SODIUM: 139 mmol/L (ref 134–144)
Total Protein: 7 g/dL (ref 6.0–8.5)

## 2014-05-12 LAB — ANEMIA PROFILE B
BASOS ABS: 0 10*3/uL (ref 0.0–0.2)
BASOS: 1 %
EOS: 1 %
Eosinophils Absolute: 0.1 10*3/uL (ref 0.0–0.4)
Ferritin: 109 ng/mL (ref 15–150)
Folate: >20 ng/mL (ref >3.0)
HCT: 41 % (ref 34.0–46.6)
Hemoglobin: 14 g/dL (ref 11.1–15.9)
IMMATURE GRANS (ABS): 0 10*3/uL (ref 0.0–0.1)
IMMATURE GRANULOCYTES: 0 %
Iron Saturation: 15 % (ref 15–55)
Iron: 47 ug/dL (ref 27–139)
LYMPHS ABS: 1.4 10*3/uL (ref 0.7–3.1)
Lymphs: 25 %
MCH: 30.2 pg (ref 26.6–33.0)
MCHC: 34.1 g/dL (ref 31.5–35.7)
MCV: 89 fL (ref 79–97)
Monocytes Absolute: 0.5 10*3/uL (ref 0.1–0.9)
Monocytes: 9 %
Neutrophils Absolute: 3.6 10*3/uL (ref 1.4–7.0)
Neutrophils Relative %: 64 %
PLATELETS: 215 10*3/uL (ref 150–379)
RBC: 4.63 x10E6/uL (ref 3.77–5.28)
RDW: 12.6 % (ref 12.3–15.4)
Retic Ct Pct: 0.9 % (ref 0.6–2.6)
TIBC: 318 ug/dL (ref 250–450)
UIBC: 271 ug/dL (ref 118–369)
Vitamin B-12: 915 pg/mL (ref 211–946)
WBC: 5.6 10*3/uL (ref 3.4–10.8)

## 2014-05-12 LAB — LIPID PANEL
CHOL/HDL RATIO: 3.5 ratio (ref 0.0–4.4)
Cholesterol, Total: 150 mg/dL (ref 100–199)
HDL: 43 mg/dL (ref >39)
LDL Calculated: 75 mg/dL (ref 0–99)
TRIGLYCERIDES: 162 mg/dL — AB (ref 0–149)
VLDL CHOLESTEROL CAL: 32 mg/dL (ref 5–40)

## 2014-05-12 LAB — VITAMIN D 25 HYDROXY (VIT D DEFICIENCY, FRACTURES): VIT D 25 HYDROXY: 43.8 ng/mL (ref 30.0–100.0)

## 2014-06-25 ENCOUNTER — Encounter: Payer: Self-pay | Admitting: Nurse Practitioner

## 2014-06-25 ENCOUNTER — Ambulatory Visit (INDEPENDENT_AMBULATORY_CARE_PROVIDER_SITE_OTHER): Payer: 59 | Admitting: Nurse Practitioner

## 2014-06-25 VITALS — BP 140/80 | HR 58 | Ht 65.0 in | Wt 145.2 lb

## 2014-06-25 DIAGNOSIS — I480 Paroxysmal atrial fibrillation: Secondary | ICD-10-CM

## 2014-06-25 MED ORDER — FLECAINIDE ACETATE 150 MG PO TABS: ORAL_TABLET | ORAL | Status: DC

## 2014-06-25 MED ORDER — FLECAINIDE ACETATE 50 MG PO TABS
50.0000 mg | ORAL_TABLET | Freq: Two times a day (BID) | ORAL | Status: DC
Start: 2014-06-25 — End: 2014-07-15

## 2014-06-25 NOTE — Progress Notes (Signed)
Primary Care Physician: [name] A, FNP/Christ A. Lendon Colonel, FNP Electrophysiologist: Dr. Tyrone Schimke is a 69 y.o. female with a h/o PAF that presented to [facility] ER in September of this year after developing rapid heart beat and was diagnosed with new onset afib. She converted with Flecainide po. She saw Dr. Johney Frame f/u and had thyroid studies, cbc, bmet which  were normal. Echo with normal systolic function, mild MR and mod TR.  Normal atrial size.  She did well maintaining sinus rhythm for several months but now has noticed almost daily episodes of afib, usually in the evening, feels the same as when she presented to the ER last September,as well as elevation of BP when it occurs. Has Cardizem 60 mg as well as flecainide 150 mg x2  as a  "pill in the pocket" and has had to use this several times over last few weeks. She is here to discuss options to decrease afib burden.  She believes her stress level was the trigger for afib and is working on that. Try's to walk for exercise and with change in dietary habits has lost 10 lbs.. She does not drink alcohol, non smoker, no snoring history. Was started on Cardizem 120 mg in October for Blood Pressure which is controlled today. Did not tolerate BB in past due to bradycardia. Tolerating blood thinners without bleeding issues. Chadsvasc score of 3.  Today, she  denies chest pain, shortness of breath, orthopnea, PND, lower extremity edema, dizziness, presyncope, syncope, or neurologic sequela.  No h/o cad.The patient is tolerating medications without difficulties and is otherwise without complaint today.   Past Medical History  Diagnosis Date  . Abnormal Pap smear   . 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 Complication of anesthesia     alittle slow to wake up-stayed drowsy  . Hypercholesteremia   . Osteoporosis, unspecified     Last DEXA 10/2011   . Hyperlipidemia with target LDL less than 100   . Vitamin D deficiency   .  HSV-2 (herpes simplex virus 2) infection   . Paroxysmal atrial fibrillation    Past Surgical History  Procedure Laterality Date  . Breast lumpectomy    . Dilation and curettage of uterus    . Laparoscopy abdomen diagnostic      Current Outpatient Prescriptions  Medication Sig Dispense Refill  . alendronate (FOSAMAX) 70 MG tablet Take 1 tablet (70 mg total) by mouth every 7 (seven) days. Take with a full glass of water on an empty stomach. 12 tablet 3  . ALPRAZolam (XANAX) 0.25 MG tablet Take 1 tablet (0.25 mg total) by mouth 2 (two) times daily as needed for anxiety. 180 tablet 0  . Calcium Carbonate (CALCIUM 600 PO) Take 1 tablet by mouth 2 (two) times daily.    . Coenzyme Q10 (CO Q 10) 100 MG CAPS Take 1 capsule by mouth daily.    [name] diltiazem (CARDIZEM LA) 120 MG 24 hr tablet Take 1 tablet (120 mg total) by mouth daily. 30 tablet 0  . escitalopram (LEXAPRO) 10 MG tablet Take 1 tablet (10 mg total) by mouth daily. 30 tablet 0  . flecainide (TAMBOCOR) 150 MG tablet Take 1 tablet by mouth at onset of afib 20 tablet 1  . Magnesium 250 MG TABS Take 2 tablets by mouth daily.    . Multiple Vitamins-Minerals (CENTRUM SILVER ULTRA WOMENS PO) Take 1 capsule by mouth daily.     . rivaroxaban (  XARELTO) 20 MG TABS tablet Take 1 tablet (20 mg total) by mouth daily with supper. 30 tablet 11  . rosuvastatin (CRESTOR) 20 MG tablet Take 1 tablet (20 mg total) by mouth daily. 90 tablet 3  . Selenium 200 MCG CAPS Take 1 capsule by mouth daily.    . valACYclovir (VALTREX) 500 MG tablet Take 1 tablet by mouth  daily 90 tablet 1  . vitamin C (ASCORBIC ACID) 500 MG tablet Take 500 mg by mouth 2 (two) times daily.     . flecainide (TAMBOCOR) 50 MG tablet Take 1 tablet (50 mg total) by mouth 2 (two) times daily. 180 tablet 3   No current facility-administered medications for this visit.    No Known Allergies  History   Social History  . Marital Status: Married    Spouse Name: N/A  . Number of Children:  N/A  . Years of Education: N/A   Occupational History  . Not on file.   Social History Main Topics  . Smoking status: Never Smoker   . Smokeless tobacco: Not on file  . Alcohol Use: No  . Drug Use: No  . Sexual Activity: Yes    Birth Control/ Protection: None   Other Topics Concern  . Not on file   Social History Narrative   Lives in [place] with spouse.    Family History  Problem Relation Age of Onset  . Other Neg Hx   . Osteoporosis Mother   . Cancer Mother     breast  . Hypertension Mother   . Heart disease Father     ROS- per HPI as above.  Physical Exam: Filed Vitals:   06/25/14 1044  BP: 140/80  Pulse: 58  Height: 5\' 5"  (1.651 m)  Weight: 145 lb 3.2 oz (65.862 kg)    GEN- The patient is well appearing, alert and oriented x 3 today.   Head- normocephalic, atraumatic Eyes-  Sclera clear, conjunctiva pink Ears- hearing intact Oropharynx- clear Neck- supple, no JVP Lymph- no cervical lymphadenopathy Lungs- Clear to ausculation bilaterally, normal work of breathing Heart- Regular rate and rhythm, no murmurs, rubs or gallops, PMI not laterally displaced GI- soft, NT, ND, + BS Extremities- no clubbing, cyanosis, or edema MS- no significant deformity or atrophy Skin- no rash or lesion Psych- euthymic mood, full affect Neuro- strength and sensation are intact  EKG today reveals sinus brady at 58 bpm, otherwise normal EKG. Pr int 150 ms, QRS 94 ms, QTC 426ms. Epic records including  ER visit, EKG from previous, labs, echo, pcp note reviewed. Echo-- Left ventricle: The cavity size was normal. Systolic function was normal. The estimated ejection fraction was in the range of 60% to 65%. Wall motion was normal; there were no regional wall motion abnormalities. Left ventricular diastolic function parameters were normal. - Aortic valve: Trileaflet; normal thickness leaflets. There was mild regurgitation. - Mitral valve: Structurally normal valve.  There was mild regurgitation. - Right ventricle: Systolic function was normal. - Right atrium: The atrium was normal in size. - Tricuspid valve: There was moderate regurgitation. - Pulmonary arteries: PA peak pressure: 34 mm Hg (S). - Pericardium, extracardiac: There was no pericardial effusion.  Weight 145 lb 3.2 oz (65.862 kg) 06/25/14 148 lb 3.2 oz (67.223 kg) 05/11/14 155 lb 6.4 oz (70.489 kg)01/28/14           Assessment and Plan:  1. Symptomatic PAF Increase in afib burden requiring increased use of pill in pocket Start flecainide 50 mg bid  Return Monday for EKG F/u Dr. Johney Frame in one month, if fails flecainide, I think pt would be a good ablation candidate and is motivated to alter lifestyle to stay in SR. If has breakthrough on flecainide, try S.A. cardizem 60 mg first, if persists, take 150 mg flecainide po x 1 instead of previous 300 mg dose. Continue xarelto  2. HTN Acceptable today but will continue to monitor in next few visits. May need additional bp meds if sys  consistently over 140. Pt already avoiding salt.

## 2014-06-25 NOTE — Patient Instructions (Signed)
Your physician recommends that you schedule a follow-up appointment in: 1 month with [name]  Your physician recommends that you schedule a follow-up appointment on: Monday for EKG/nurse visit   Your physician has recommended you make the following change in your medication:  1)Flecainide 50mg  twice daily 2)Only take one Flecainide 150mg  if needed for break through afib.  3)Take the cardizem with breakthrough afib before flecainide 150mg 

## 2014-06-29 ENCOUNTER — Ambulatory Visit (INDEPENDENT_AMBULATORY_CARE_PROVIDER_SITE_OTHER): Payer: 59 | Admitting: *Deleted

## 2014-06-29 VITALS — BP 150/80 | HR 50 | Ht 65.0 in | Wt 145.8 lb

## 2014-06-29 DIAGNOSIS — I480 Paroxysmal atrial fibrillation: Secondary | ICD-10-CM

## 2014-06-29 NOTE — Progress Notes (Signed)
1.) Reason for visit: EKG  2.) Name of MD requesting visit: Rudi Cocoonna Carroll, NP  3.) H&P: copied from Rivka SaferDonna Carroll,NP 06/25/14 office note:  Symptomatic PAF Increase in afib burden requiring increased use of pill in pocket Start flecainide 50 mg bid  Return Monday for EKG  4.) ROS related to problem: pt without complaints today and states she thinks flecainide may have helped her heart rhythm.  5.) Assessment and plan per MD: sinus rhythm, normal ekg, no changes today         EKG  reviewed with Dr Johney FrameAllred (DOD).        Dr Johney FrameAllred did not have any new recommendations today, keep follow up appt scheduled with Dr Johney FrameAllred 07/29/14.

## 2014-07-15 ENCOUNTER — Other Ambulatory Visit: Payer: Self-pay

## 2014-07-15 DIAGNOSIS — I480 Paroxysmal atrial fibrillation: Secondary | ICD-10-CM

## 2014-07-15 MED ORDER — FLECAINIDE ACETATE 50 MG PO TABS: 50.0000 mg | ORAL_TABLET | Freq: Two times a day (BID) | ORAL | Status: DC

## 2014-07-16 ENCOUNTER — Other Ambulatory Visit: Payer: Self-pay | Admitting: Nurse Practitioner

## 2014-07-16 ENCOUNTER — Other Ambulatory Visit: Payer: Self-pay | Admitting: *Deleted

## 2014-07-16 DIAGNOSIS — I480 Paroxysmal atrial fibrillation: Secondary | ICD-10-CM

## 2014-07-16 MED ORDER — FLECAINIDE ACETATE 50 MG PO TABS: 50.0000 mg | ORAL_TABLET | Freq: Two times a day (BID) | ORAL | Status: DC

## 2014-07-16 NOTE — Progress Notes (Unsigned)
Notified pt that a GXT will need to be scheduled since she was recently started on flecainide. Patient understood need for this and will be expecting call regarding appointment for this.

## 2014-07-16 NOTE — Telephone Encounter (Signed)
Patient notified I talked with [employer] regarding pre-authorization needed for flecainide 50mg  bid.  Kathryn Auerbachcigna stated it was considered an early refill since she [facility] but [facility] could fill rx tomorrow 07/17/2014. Rx was sent to [facility]

## 2014-07-29 ENCOUNTER — Ambulatory Visit: Payer: 59 | Admitting: Internal Medicine

## 2014-08-10 ENCOUNTER — Other Ambulatory Visit: Payer: Self-pay | Admitting: Family

## 2014-08-10 DIAGNOSIS — F411 Generalized anxiety disorder: Secondary | ICD-10-CM

## 2014-08-10 MED ORDER — ALPRAZOLAM 0.25 MG PO TABS
0.2500 mg | ORAL_TABLET | Freq: Two times a day (BID) | ORAL | Status: DC | PRN
Start: 2014-08-10 — End: 2014-11-12

## 2014-08-10 NOTE — Telephone Encounter (Signed)
Patient aware that rx is ready to be picked up.  

## 2014-08-12 ENCOUNTER — Other Ambulatory Visit: Payer: Self-pay

## 2014-08-12 ENCOUNTER — Ambulatory Visit (HOSPITAL_COMMUNITY)
Admission: RE | Admit: 2014-08-12 | Discharge: 2014-08-12 | Disposition: A | Discharge status: Home / Self Care | Payer: 59 | Source: Ambulatory Visit | Attending: Nurse Practitioner | Admitting: Nurse Practitioner

## 2014-08-12 ENCOUNTER — Telehealth: Payer: Self-pay | Admitting: Physician Assistant

## 2014-08-12 DIAGNOSIS — R0789 Other chest pain: Secondary | ICD-10-CM | POA: Diagnosis not present

## 2014-08-12 DIAGNOSIS — I480 Paroxysmal atrial fibrillation: Secondary | ICD-10-CM

## 2014-08-12 NOTE — Telephone Encounter (Signed)
Treadmill ETT done for flecainide baseline. Patient exercised 6:46 on Bruce protocol without angina. Target HR achieved around 5:50. Test terminated due to dyspnea and target HR achieved. No ischemic ST changes. Just after test stopped and recovery started, patient was noted to have intermittent non-conducted P waves associated with brief pause between QRS complexes. She was asymptomatic with this. This lasted for about a minute into recovery, infrequently interspersed in her sinus tach - HR gradually came down as expected in recovery. Reviewed with Dr. Graciela HusbandsKlein who felt these were nonconducted PACs and of no clinical significance. No PVCs or ventricular arrhythmias during study.  Finalized read to be completed in EPIC by EKG reader. EKG department reports this is Dr. Graciela HusbandsKlein and Dr. Mariah MillingGollan - will forward this message to them as FYI. Dayna Dunn PA-C

## 2014-09-17 ENCOUNTER — Other Ambulatory Visit: Payer: Self-pay | Admitting: Internal Medicine

## 2014-09-28 ENCOUNTER — Encounter: Payer: Self-pay | Admitting: Internal Medicine

## 2014-09-28 ENCOUNTER — Encounter: Payer: Self-pay | Admitting: *Deleted

## 2014-09-28 ENCOUNTER — Ambulatory Visit (INDEPENDENT_AMBULATORY_CARE_PROVIDER_SITE_OTHER): Payer: 59 | Admitting: Internal Medicine

## 2014-09-28 VITALS — BP 146/80 | HR 48 | Ht 65.0 in | Wt 143.8 lb

## 2014-09-28 DIAGNOSIS — I480 Paroxysmal atrial fibrillation: Secondary | ICD-10-CM

## 2014-09-28 NOTE — Progress Notes (Signed)
Electrophysiology Office Note   Date:  09/28/2014   ID:  Kathryn Arnold, DOB Dec 26, 1945, MRN [id number]  PCP:  Junie Spencer, FNP   Primary Electrophysiologist: Hillis Range, MD    Chief Complaint  Patient presents with  . PAF     History of Present Illness: Kathryn Arnold is a 69 y.o. female who presents today for electrophysiology evaluation.   Doing reasonably well.  Feels fatigued on medically therapy for afib with bradycardia.  Reports that she is "tired" during the day.  AF episodes of reduced but not resolved in frequency with flecainide. Today, she denies symptoms of palpitations, chest pain, shortness of breath, orthopnea, PND, lower extremity edema, claudication, dizziness, presyncope, syncope, bleeding, or neurologic sequela. The patient is tolerating medications without difficulties and is otherwise without complaint today.    Past Medical History  Diagnosis Date  . Abnormal Pap smear   . 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 Complication of anesthesia     alittle slow to wake up-stayed drowsy  . Hypercholesteremia   . Osteoporosis, unspecified     Last DEXA 10/2011   . Hyperlipidemia with target LDL less than 100   . Vitamin D deficiency   . HSV-2 (herpes simplex virus 2) infection   . Paroxysmal atrial fibrillation    Past Surgical History  Procedure Laterality Date  . Breast lumpectomy    . Dilation and curettage of uterus    . Laparoscopy abdomen diagnostic       Current Outpatient Prescriptions  Medication Sig Dispense Refill  . alendronate (FOSAMAX) 70 MG tablet Take 1 tablet (70 mg total) by mouth every 7 (seven) days. Take with a full glass of water on an empty stomach. 12 tablet 3  . ALPRAZolam (XANAX) 0.25 MG tablet Take 1 tablet (0.25 mg total) by mouth 2 (two) times daily as needed for anxiety. 180 tablet 1  . Calcium Carbonate (CALCIUM 600 PO) Take 1 tablet by mouth 2 (two) times daily.    . Coenzyme Q10 (CO Q 10) 100 MG CAPS Take 1  capsule by mouth daily.    . CRESTOR 20 MG tablet Take 1 tablet by mouth  daily 90 tablet 0  . diltiazem (CARDIZEM LA) 120 MG 24 hr tablet Take 1 tablet (120 mg total) by mouth daily. 30 tablet 0  . diltiazem (CARDIZEM) 60 MG tablet Take 1 tablet by mouth daily as needed  for fast heart rate/high BP    . escitalopram (LEXAPRO) 10 MG tablet Take 1 tablet (10 mg total) by mouth daily. 30 tablet 0  . flecainide (TAMBOCOR) 150 MG tablet Take 1 tablet by mouth at onset of afib (Patient taking differently: Take 1 tablet by mouth daily as needed at onset of afib) 20 tablet 1  . flecainide (TAMBOCOR) 50 MG tablet Take 1 tablet (50 mg total) by mouth 2 (two) times daily. 180 tablet 3  . Magnesium 250 MG TABS Take 2 tablets by mouth daily.    . Multiple Vitamins-Minerals (CENTRUM SILVER ULTRA WOMENS PO) Take 1 capsule by mouth daily.     . Selenium 200 MCG CAPS Take 1 capsule by mouth daily.    . valACYclovir (VALTREX) 500 MG tablet Take 1 tablet by mouth  daily 90 tablet 1  . vitamin C (ASCORBIC ACID) 500 MG tablet Take 500 mg by mouth 2 (two) times daily.     [name] 20 MG TABS tablet Take 1 tablet (20 mg total) by mouth  daily with supper. 90 tablet 0   No current facility-administered medications for this visit.    Allergies:   Review of patient's allergies indicates no known allergies.   Social History:  The patient  reports that she has never smoked. She does not have any smokeless tobacco history on file. She reports that she does not drink alcohol or use illicit drugs.   Family History:  The patient's  family history includes Cancer in her mother; Heart disease in her father; Hypertension in her mother; Osteoporosis in her mother. There is no history of Other.    ROS:  Please see the history of present illness.   All other systems are reviewed and negative.    PHYSICAL EXAM: VS:  BP 146/80 mmHg  Pulse 48  Ht 5\' 5"  (1.651 m)  Wt 65.227 kg (143 lb 12.8 oz)  BMI 23.93 kg/m2 , BMI Body  mass index is 23.93 kg/(m^2). GEN: Well nourished, well developed, in no acute distress HEENT: normal Neck: no JVD, carotid bruits, or masses Cardiac: RRR; no murmurs, rubs, or gallops,no edema  Respiratory:  clear to auscultation bilaterally, normal work of breathing GI: soft, nontender, nondistended, + BS MS: no deformity or atrophy Skin: warm and dry  Neuro:  Strength and sensation are intact Psych: euthymic mood, full affect  EKG:  EKG is ordered today. The ekg ordered today shows sinus bradycardia 48 bpm, PR 180, incomplete RBBB   Recent Labs: 12/31/2013: TSH 0.74 05/11/2014: ALT 12; BUN 16; Creatinine 0.74; Hemoglobin 14.0; Platelets 215; Potassium 4.1; Sodium 139    Lipid Panel     Component Value Date/Time   CHOL 150 05/11/2014 1207   CHOL 168 12/18/2013 1103   CHOL 136 08/22/2012 0847   TRIG 162* 05/11/2014 1207   TRIG 195* 12/18/2013 1103   TRIG 109 08/22/2012 0847   HDL 43 05/11/2014 1207   HDL 47 12/18/2013 1103   HDL 47 08/22/2012 0847   CHOLHDL 3.5 05/11/2014 1207   LDLCALC 75 05/11/2014 1207   LDLCALC 82 12/18/2013 1103   LDLCALC 67 08/22/2012 0847     Wt Readings from Last 3 Encounters:  09/28/14 65.227 kg (143 lb 12.8 oz)  06/29/14 66.112 kg (145 lb 12 oz)  06/25/14 65.862 kg (145 lb 3.2 oz)      Other studies Reviewed: Additional studies/ records that were reviewed today include: AF clinic notes and prior echo   ASSESSMENT AND PLAN:  1.  Paroxysmal atrial fibrillation Symptomatic AF.  Medical therapy limited by bradycardia and fatigue. Therapeutic strategies for afib including medicine and ablation were discussed in detail with the patient today. Risk, benefits, and alternatives to EP study and radiofrequency ablation for afib were also discussed in detail today. These risks include but are not limited to stroke, bleeding, vascular damage, tamponade, perforation, damage to the esophagus, lungs, and other structures, pulmonary vein stenosis,  worsening renal function, and death. The patient understands these risk and wishes to proceed.  We will schedule at next available time.  Will obtain Cardiac CT prior to ablation to evaluate PVs and also to exclude LA thrombus.  I will not plan to get a TEE if cardiac CT is unrevealing. chads2vasc score is at least 2.  Continue anticoagulation.   Current medicines are reviewed at length with the patient today.   The patient does not have concerns regarding her medicines.  The following changes were made today:  none  Labs/ tests ordered today include:  Orders Placed This Encounter  Procedures  . CT Heart Morp W/Cta Cor W/Score W/Ca W/Cm &/Or Wo/Cm  . Basic metabolic panel  . CBC with Differential/Platelet  . EKG 12-Lead    Signed, Hillis Range, MD  09/28/2014 2:14 PM     [facility] [address] [place] [address] [phone] (office) [phone] (fax)

## 2014-09-28 NOTE — Patient Instructions (Signed)
Medication Instructions:  Your physician recommends that you continue on your current medications as directed. Please refer to the Current Medication list given to you today.   Labwork: Your physician recommends that you return for lab work on 10/27/14   Testing/Procedures:  Your physician has recommended that you have an ablation. Catheter ablation is a medical procedure used to treat some cardiac arrhythmias (irregular heartbeats). During catheter ablation, a long, thin, flexible tube is put into a blood vessel in your groin (upper thigh), or neck. This tube is called an ablation catheter. It is then guided to your heart through the blood vessel. Radio frequency waves destroy small areas of heart tissue where abnormal heartbeats may cause an arrhythmia to start. Please see the instruction sheet given to you today.  Your physician has requested that you have cardiac CT. Cardiac computed tomography (CT) is a painless test that uses an x-ray machine to take clear, detailed pictures of your heart. For further information please visit [email or web address]. Please follow instruction sheet as given.---week prior to the ablation      Follow-Up: Your physician recommends that you schedule a follow-up appointment in 4 weeks from 11/03/14 with Rudi Cocoonna Carroll, NP and 3 months from 11/03/14 with Dr Johney FrameAllred   Any Other Special Instructions Will Be Listed Below (If Applicable).

## 2014-10-01 ENCOUNTER — Encounter: Payer: Self-pay | Admitting: Internal Medicine

## 2014-10-09 ENCOUNTER — Other Ambulatory Visit: Payer: Self-pay | Admitting: *Deleted

## 2014-10-09 DIAGNOSIS — I480 Paroxysmal atrial fibrillation: Secondary | ICD-10-CM

## 2014-10-12 ENCOUNTER — Ambulatory Visit (HOSPITAL_COMMUNITY)
Admission: RE | Admit: 2014-10-12 | Discharge: 2014-10-12 | Disposition: A | Discharge status: Home / Self Care | Payer: 59 | Source: Ambulatory Visit | Attending: Internal Medicine | Admitting: Internal Medicine

## 2014-10-12 ENCOUNTER — Other Ambulatory Visit: Payer: Self-pay | Admitting: Internal Medicine

## 2014-10-12 DIAGNOSIS — I2510 Atherosclerotic heart disease of native coronary artery without angina pectoris: Secondary | ICD-10-CM | POA: Insufficient documentation

## 2014-10-12 DIAGNOSIS — I480 Paroxysmal atrial fibrillation: Secondary | ICD-10-CM

## 2014-10-12 DIAGNOSIS — Z01818 Encounter for other preprocedural examination: Secondary | ICD-10-CM | POA: Insufficient documentation

## 2014-10-12 IMAGING — CT CT HEART MORPH/PULM VEIN W/ CM & W/O CA SCORE
1 of 10 series · 1 of 20 positions shown, 2 images · non-contrast
Comparison: Radiograph 01/07/2014

CLINICAL DATA: 68-year-old female with prior atrial fibrillation.
Evaluate pulmonary veins prior to ablation.

EXAM:
Cardiac/Coronary  CT
TECHNIQUE: The patient was scanned on a Philips 256 scanner.

[Series 300: locator · axial · 0.35mm/px · z∈[-202,-202]mm · 1 of 1 slices shown, 2 images]
[im 1/1  vessel]
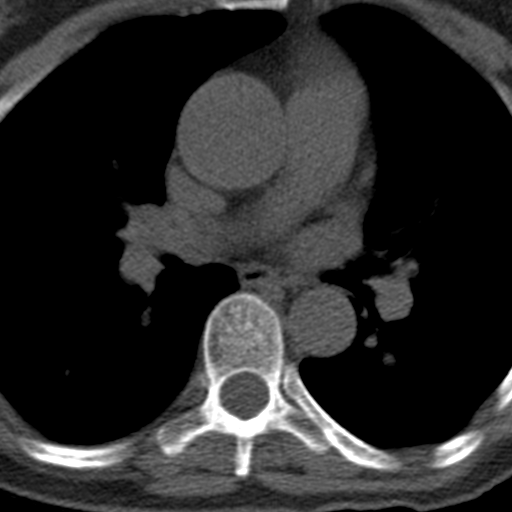
[im 1/1  lung]
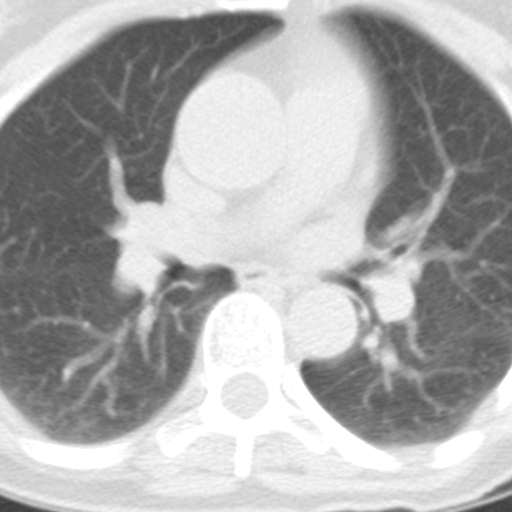

[1 of 20 positions shown; findings below may reference images not displayed]

FINDINGS: A 120 kV prospective scan was triggered in the descending thoracic
aorta at 111 HU's. Axial non-contrast 3 mm slices were carried out
through the heart. The data set was analyzed on a dedicated work
station and scored using the Agatson method. Gantry rotation speed
was 270 msecs and collimation was .9 mm. No beta blockade and 0.4 mg
of sl NTG was given. The 3D data set was reconstructed in 5%
intervals of the 67-82 % of the R-R cycle. Diastolic phases were
analyzed on a dedicated work station using MPR, MIP and VRT modes.
The patient received 80 cc of contrast.

There is normal drainage of pulmonary veins into left atrium. The
measurements are as follows:

RUPV:  20 x 17 mm

RLPV:  20 x 16 mm

LUPV:  17 x 12 mm

LLPV:  19 x 12 mm

Esophagus runs in the proximity to the LUPV.

Left atrial appendage is large with no evidence of filling defect.

No ASD or PFO identified.

Aorta: Normal caliber. Minimal calcifications in the aortic arch. No
dissection.

Aortic Valve: Trileaflet. No calcifications. Normal aortic root
size.

Coronary Arteries:  Normal origin.  Right dominance.

RCA is a large vessel that gives rise to PDA and PLA. There is no
plaque.

LM has no plaque.

LAD has minimal calcified plaque in its mid segments.

LCX gives rise to two large obtuse marginal branches. There is no
plaque.
IMPRESSION: 1. Coronary calcium score of 1. This was 39% percentile for age and
sex matched control.

2. Normal coronary origin. Right coronary dominance. Minimal
calcified non-obstructive plaque.

3. There is normal drainage of pulmonary veins into left atrium with
no evidence of stenosis. Esophagus runs in the proximity to the
LUPV.

4.  Left atrium has normal size.

5. Left atrial appendage is large with no evidence of thrombus.

Iveth Camille

EXAM:
OVER-READ INTERPRETATION  CT CHEST

The following report is an over-read performed by radiologist Dr.
over-read does not include interpretation of cardiac or coronary
anatomy or pathology. The coronary CTA interpretation by the
cardiologist is attached.
FINDINGS: Limited view of the lung windows demonstrates no measurable
nodularity. Airways are normal.

Limited view of the upper abdomen demonstrates normal adrenal
glands. Otherwise unremarkable.

Limited view of the chest wall and skeleton is unremarkable.

Cardiac structures described in Coronary CTA report.
IMPRESSION: No  significant extracardiac findings.

## 2014-10-12 MED ORDER — NITROGLYCERIN 0.4 MG SL SUBL
SUBLINGUAL_TABLET | SUBLINGUAL | Status: AC
Start: 2014-10-12 — End: 2014-10-12
  Administered 2014-10-12: 0.4 mg
  Filled 2014-10-12: qty 1

## 2014-10-12 MED ORDER — IOHEXOL 350 MG/ML SOLN
80.0000 mL | Freq: Once | INTRAVENOUS | Status: AC | PRN
  Administered 2014-10-12: 100 mL via INTRAVENOUS

## 2014-10-14 ENCOUNTER — Other Ambulatory Visit: Payer: Self-pay | Admitting: Family

## 2014-10-14 NOTE — Telephone Encounter (Signed)
Last seen and last lipid 05/11/14 Kathryn Arnold  Requesting 90 day supply

## 2014-10-27 ENCOUNTER — Other Ambulatory Visit (INDEPENDENT_AMBULATORY_CARE_PROVIDER_SITE_OTHER): Payer: 59 | Admitting: *Deleted

## 2014-10-27 DIAGNOSIS — I480 Paroxysmal atrial fibrillation: Secondary | ICD-10-CM

## 2014-10-27 LAB — CBC WITH DIFFERENTIAL/PLATELET
BASOS ABS: 0 10*3/uL (ref 0.0–0.1)
Basophils Relative: 0.6 % (ref 0.0–3.0)
Eosinophils Absolute: 0.1 10*3/uL (ref 0.0–0.7)
Eosinophils Relative: 1.3 % (ref 0.0–5.0)
HCT: 40.3 % (ref 36.0–46.0)
Hemoglobin: 13.6 g/dL (ref 12.0–15.0)
LYMPHS ABS: 1.7 10*3/uL (ref 0.7–4.0)
Lymphocytes Relative: 33.4 % (ref 12.0–46.0)
MCHC: 33.9 g/dL (ref 30.0–36.0)
MCV: 89.1 fl (ref 78.0–100.0)
Monocytes Absolute: 0.4 10*3/uL (ref 0.1–1.0)
Monocytes Relative: 7.4 % (ref 3.0–12.0)
Neutro Abs: 2.8 10*3/uL (ref 1.4–7.7)
Neutrophils Relative %: 57.3 % (ref 43.0–77.0)
PLATELETS: 208 10*3/uL (ref 150.0–400.0)
RBC: 4.52 Mil/uL (ref 3.87–5.11)
RDW: 12.5 % (ref 11.5–15.5)
WBC: 5 10*3/uL (ref 4.0–10.5)

## 2014-10-27 LAB — BASIC METABOLIC PANEL
BUN: 17 mg/dL (ref 6–23)
CHLORIDE: 105 meq/L (ref 96–112)
CO2: 28 mEq/L (ref 19–32)
CREATININE: 0.88 mg/dL (ref 0.40–1.20)
Calcium: 9.5 mg/dL (ref 8.4–10.5)
GFR: 68.13 mL/min (ref >60.00)
GLUCOSE: 93 mg/dL (ref 70–99)
Potassium: 4.1 mEq/L (ref 3.5–5.1)
Sodium: 141 mEq/L (ref 135–145)

## 2014-10-29 ENCOUNTER — Encounter: Payer: Self-pay | Admitting: Family

## 2014-10-29 ENCOUNTER — Ambulatory Visit (INDEPENDENT_AMBULATORY_CARE_PROVIDER_SITE_OTHER): Payer: 59 | Admitting: Family

## 2014-10-29 VITALS — BP 135/80 | HR 47 | Temp 96.7°F | Ht 65.0 in | Wt 142.0 lb

## 2014-10-29 DIAGNOSIS — M810 Age-related osteoporosis without current pathological fracture: Secondary | ICD-10-CM

## 2014-10-29 DIAGNOSIS — B009 Herpesviral infection, unspecified: Secondary | ICD-10-CM

## 2014-10-29 DIAGNOSIS — I480 Paroxysmal atrial fibrillation: Secondary | ICD-10-CM | POA: Diagnosis not present

## 2014-10-29 DIAGNOSIS — F411 Generalized anxiety disorder: Secondary | ICD-10-CM

## 2014-10-29 DIAGNOSIS — E785 Hyperlipidemia, unspecified: Secondary | ICD-10-CM | POA: Diagnosis not present

## 2014-10-29 DIAGNOSIS — I10 Essential (primary) hypertension: Secondary | ICD-10-CM | POA: Diagnosis not present

## 2014-10-29 DIAGNOSIS — E559 Vitamin D deficiency, unspecified: Secondary | ICD-10-CM | POA: Diagnosis not present

## 2014-10-29 MED ORDER — VALACYCLOVIR HCL 500 MG PO TABS: ORAL_TABLET | ORAL | Status: DC

## 2014-10-29 MED ORDER — ROSUVASTATIN CALCIUM 20 MG PO TABS: ORAL_TABLET | ORAL | Status: DC

## 2014-10-29 NOTE — Patient Instructions (Signed)
Health Maintenance Adopting a healthy lifestyle and getting preventive care can go a long way to promote health and wellness. Talk with your health care provider about what schedule of regular examinations is right for you. This is a good chance for you to check in with your provider about disease prevention and staying healthy. In between checkups, there are plenty of things you can do on your own. Experts have done a lot of research about which lifestyle changes and preventive measures are most likely to keep you healthy. Ask your health care provider for more information. WEIGHT AND DIET  Eat a healthy diet  Be sure to include plenty of vegetables, fruits, low-fat dairy products, and lean protein.  Do not eat a lot of foods high in solid fats, added sugars, or salt.  Get regular exercise. This is one of the most important things you can do for your health.  Most adults should exercise for at least 150 minutes each week. The exercise should increase your heart rate and make you sweat (moderate-intensity exercise).  Most adults should also do strengthening exercises at least twice a week. This is in addition to the moderate-intensity exercise.  Maintain a healthy weight  Body mass index (BMI) is a measurement that can be used to identify possible weight problems. It estimates body fat based on height and weight. Your health care provider can help determine your BMI and help you achieve or maintain a healthy weight.  For females 25 years of age and older:   A BMI below 18.5 is considered underweight.  A BMI of 18.5 to 24.9 is normal.  A BMI of 25 to 29.9 is considered overweight.  A BMI of 30 and above is considered obese.  Watch levels of cholesterol and blood lipids  You should start having your blood tested for lipids and cholesterol at 67 years of age, then have this test every 5 years.  You may need to have your cholesterol levels checked more often if:  Your lipid or  cholesterol levels are high.  You are older than 67 years of age.  You are at high risk for heart disease.  CANCER SCREENING   Lung Cancer  Lung cancer screening is recommended for adults 97-92 years old who are at high risk for lung cancer because of a history of smoking.  A yearly low-dose CT scan of the lungs is recommended for people who:  Currently smoke.  Have quit within the past 15 years.  Have at least a 30-pack-year history of smoking. A pack year is smoking an average of one pack of cigarettes a day for 1 year.  Yearly screening should continue until it has been 15 years since you quit.  Yearly screening should stop if you develop a health problem that would prevent you from having lung cancer treatment.  Breast Cancer  Practice breast self-awareness. This means understanding how your breasts normally appear and feel.  It also means doing regular breast self-exams. Let your health care provider know about any changes, no matter how small.  If you are in your 20s or 30s, you should have a clinical breast exam (CBE) by a health care provider every 1-3 years as part of a regular health exam.  If you are 76 or older, have a CBE every year. Also consider having a breast X-ray (mammogram) every year.  If you have a family history of breast cancer, talk to your health care provider about genetic screening.  If you are  at high risk for breast cancer, talk to your health care provider about having an MRI and a mammogram every year.  Breast cancer gene (BRCA) assessment is recommended for women who have family members with BRCA-related cancers. BRCA-related cancers include:  Breast.  Ovarian.  Tubal.  Peritoneal cancers.  Results of the assessment will determine the need for genetic counseling and BRCA1 and BRCA2 testing. Cervical Cancer Routine pelvic examinations to screen for cervical cancer are no longer recommended for nonpregnant women who are considered low  risk for cancer of the pelvic organs (ovaries, uterus, and vagina) and who do not have symptoms. A pelvic examination may be necessary if you have symptoms including those associated with pelvic infections. Ask your health care provider if a screening pelvic exam is right for you.   The Pap test is the screening test for cervical cancer for women who are considered at risk.  If you had a hysterectomy for a problem that was not cancer or a condition that could lead to cancer, then you no longer need Pap tests.  If you are older than 65 years, and you have had normal Pap tests for the past 10 years, you no longer need to have Pap tests.  If you have had past treatment for cervical cancer or a condition that could lead to cancer, you need Pap tests and screening for cancer for at least 20 years after your treatment.  If you no longer get a Pap test, assess your risk factors if they change (such as having a new sexual partner). This can affect whether you should start being screened again.  Some women have medical problems that increase their chance of getting cervical cancer. If this is the case for you, your health care provider may recommend more frequent screening and Pap tests.  The human papillomavirus (HPV) test is another test that may be used for cervical cancer screening. The HPV test looks for the virus that can cause cell changes in the cervix. The cells collected during the Pap test can be tested for HPV.  The HPV test can be used to screen women 30 years of age and older. Getting tested for HPV can extend the interval between normal Pap tests from three to five years.  An HPV test also should be used to screen women of any age who have unclear Pap test results.  After 67 years of age, women should have HPV testing as often as Pap tests.  Colorectal Cancer  This type of cancer can be detected and often prevented.  Routine colorectal cancer screening usually begins at 67 years of  age and continues through 67 years of age.  Your health care provider may recommend screening at an earlier age if you have risk factors for colon cancer.  Your health care provider may also recommend using home test kits to check for hidden blood in the stool.  A small camera at the end of a tube can be used to examine your colon directly (sigmoidoscopy or colonoscopy). This is done to check for the earliest forms of colorectal cancer.  Routine screening usually begins at age 50.  Direct examination of the colon should be repeated every 5-10 years through 67 years of age. However, you may need to be screened more often if early forms of precancerous polyps or small growths are found. Skin Cancer  Check your skin from head to toe regularly.  Tell your health care provider about any new moles or changes in   moles, especially if there is a change in a mole's shape or color.  Also tell your health care provider if you have a mole that is larger than the size of a pencil eraser.  Always use sunscreen. Apply sunscreen liberally and repeatedly throughout the day.  Protect yourself by wearing long sleeves, pants, a wide-brimmed hat, and sunglasses whenever you are outside. HEART DISEASE, DIABETES, AND HIGH BLOOD PRESSURE   Have your blood pressure checked at least every 1-2 years. High blood pressure causes heart disease and increases the risk of stroke.  If you are between 75 years and 42 years old, ask your health care provider if you should take aspirin to prevent strokes.  Have regular diabetes screenings. This involves taking a blood sample to check your fasting blood sugar level.  If you are at a normal weight and have a low risk for diabetes, have this test once every three years after 67 years of age.  If you are overweight and have a high risk for diabetes, consider being tested at a younger age or more often. PREVENTING INFECTION  Hepatitis B  If you have a higher risk for  hepatitis B, you should be screened for this virus. You are considered at high risk for hepatitis B if:  You were born in a country where hepatitis B is common. Ask your health care provider which countries are considered high risk.  Your parents were born in a high-risk country, and you have not been immunized against hepatitis B (hepatitis B vaccine).  You have HIV or AIDS.  You use needles to inject street drugs.  You live with someone who has hepatitis B.  You have had sex with someone who has hepatitis B.  You get hemodialysis treatment.  You take certain medicines for conditions, including cancer, organ transplantation, and autoimmune conditions. Hepatitis C  Blood testing is recommended for:  Everyone born from [birth year] through 1963.  Anyone with known risk factors for hepatitis C. Sexually transmitted infections (STIs)  You should be screened for sexually transmitted infections (STIs) including gonorrhea and chlamydia if:  You are sexually active and are younger than 67 years of age.  You are older than 67 years of age and your health care provider tells you that you are at risk for this type of infection.  Your sexual activity has changed since you were last screened and you are at an increased risk for chlamydia or gonorrhea. Ask your health care provider if you are at risk.  If you do not have HIV, but are at risk, it may be recommended that you take a prescription medicine daily to prevent HIV infection. This is called pre-exposure prophylaxis (PrEP). You are considered at risk if:  You are sexually active and do not regularly use condoms or know the HIV status of your partner(s).  You take drugs by injection.  You are sexually active with a partner who has HIV. Talk with your health care provider about whether you are at high risk of being infected with HIV. If you choose to begin PrEP, you should first be tested for HIV. You should then be tested every 3 months for  as long as you are taking PrEP.  PREGNANCY   If you are premenopausal and you may become pregnant, ask your health care provider about preconception counseling.  If you may become pregnant, take 400 to 800 micrograms (mcg) of folic acid every day.  If you want to prevent pregnancy, talk to your  health care provider about birth control (contraception). OSTEOPOROSIS AND MENOPAUSE   Osteoporosis is a disease in which the bones lose minerals and strength with aging. This can result in serious bone fractures. Your risk for osteoporosis can be identified using a bone density scan.  If you are 39 years of age or older, or if you are at risk for osteoporosis and fractures, ask your health care provider if you should be screened.  Ask your health care provider whether you should take a calcium or vitamin D supplement to lower your risk for osteoporosis.  Menopause may have certain physical symptoms and risks.  Hormone replacement therapy may reduce some of these symptoms and risks. Talk to your health care provider about whether hormone replacement therapy is right for you.  HOME CARE INSTRUCTIONS   Schedule regular health, dental, and eye exams.  Stay current with your immunizations.   Do not use any tobacco products including cigarettes, chewing tobacco, or electronic cigarettes.  If you are pregnant, do not drink alcohol.  If you are breastfeeding, limit how much and how often you drink alcohol.  Limit alcohol intake to no more than 1 drink per day for nonpregnant women. One drink equals 12 ounces of beer, 5 ounces of wine, or 1 ounces of hard liquor.  Do not use street drugs.  Do not share needles.  Ask your health care provider for help if you need support or information about quitting drugs.  Tell your health care provider if you often feel depressed.  Tell your health care provider if you have ever been abused or do not feel safe at home. Document Released: 10/24/2010  Document Revised: 08/25/2013 Document Reviewed: 03/12/2013 [facility] Patient Information 2015 [place], [place]. This information is not intended to replace advice given to you by your health care provider. Make sure you discuss any questions you have with your health care provider. Atrial Fibrillation Atrial fibrillation is a type of irregular heart rhythm (arrhythmia). During atrial fibrillation, the upper chambers of the heart (atria) quiver continuously in a chaotic pattern. This causes an irregular and often rapid heart rate.  Atrial fibrillation is the result of the heart becoming overloaded with disorganized signals that tell it to beat. These signals are normally released one at a time by a part of the right atrium called the sinoatrial node. They then travel from the atria to the lower chambers of the heart (ventricles), causing the atria and ventricles to contract and pump blood as they pass. In atrial fibrillation, parts of the atria outside of the sinoatrial node also release these signals. This results in two problems. First, the atria receive so many signals that they do not have time to fully contract. Second, the ventricles, which can only receive one signal at a time, beat irregularly and out of rhythm with the atria.  There are three types of atrial fibrillation:   Paroxysmal. Paroxysmal atrial fibrillation starts suddenly and stops on its own within a week.  Persistent. Persistent atrial fibrillation lasts for more than a week. It may stop on its own or with treatment.  Permanent. Permanent atrial fibrillation does not go away. Episodes of atrial fibrillation may lead to permanent atrial fibrillation. Atrial fibrillation can prevent your heart from pumping blood normally. It increases your risk of stroke and can lead to heart failure.  CAUSES   Heart conditions, including a heart attack, heart failure, coronary artery disease, and heart valve conditions.   Inflammation of the sac  that surrounds the  heart (pericarditis).  Blockage of an artery in the lungs (pulmonary embolism).  Pneumonia or other infections.  Chronic lung disease.  Thyroid problems, especially if the thyroid is overactive (hyperthyroidism).  Caffeine, excessive alcohol use, and use of some illegal drugs.   Use of some medicines, including certain decongestants and diet pills.  Heart surgery.   Birth defects.  Sometimes, no cause can be found. When this happens, the atrial fibrillation is called lone atrial fibrillation. The risk of complications from atrial fibrillation increases if you have lone atrial fibrillation and you are age 67 years or older. RISK FACTORS  Heart failure.  Coronary artery disease.  Diabetes mellitus.   High blood pressure (hypertension).   Obesity.   Other arrhythmias.   Increased age. SIGNS AND SYMPTOMS   A feeling that your heart is beating rapidly or irregularly.   A feeling of discomfort or pain in your chest.   Shortness of breath.   Sudden light-headedness or weakness.   Getting tired easily when exercising.   Urinating more often than normal (mainly when atrial fibrillation first begins).  In paroxysmal atrial fibrillation, symptoms may start and suddenly stop. DIAGNOSIS  Your health care provider may be able to detect atrial fibrillation when taking your pulse. Your health care provider may have you take a test called an ambulatory electrocardiogram (ECG). An ECG records your heartbeat patterns over a 24-hour period. You may also have other tests, such as:  Transthoracic echocardiogram (TTE). During echocardiography, sound waves are used to evaluate how blood flows through your heart.  Transesophageal echocardiogram (TEE).  Stress test. There is more than one type of stress test. If a stress test is needed, ask your health care provider about which type is best for you.  Chest X-ray exam.  Blood tests.  Computed  tomography (CT). TREATMENT  Treatment may include:  Treating any underlying conditions. For example, if you have an overactive thyroid, treating the condition may correct atrial fibrillation.  Taking medicine. Medicines may be given to control a rapid heart rate or to prevent blood clots, heart failure, or a stroke.  Having a procedure to correct the rhythm of the heart:  Electrical cardioversion. During electrical cardioversion, a controlled, low-energy shock is delivered to the heart through your skin. If you have chest pain, very low blood pressure, or sudden heart failure, this procedure may need to be done as an emergency.  Catheter ablation. During this procedure, heart tissues that send the signals that cause atrial fibrillation are destroyed.  Surgical ablation. During this surgery, thin lines of heart tissue that carry the abnormal signals are destroyed. This procedure can either be an open-heart surgery or a minimally invasive surgery. With the minimally invasive surgery, small cuts are made to access the heart instead of a large opening.  Pulmonary venous isolation. During this surgery, tissue around the veins that carry blood from the lungs (pulmonary veins) is destroyed. This tissue is thought to carry the abnormal signals. HOME CARE INSTRUCTIONS   Take medicines only as directed by your health care provider. Some medicines can make atrial fibrillation worse or recur.  If blood thinners were prescribed by your health care provider, take them exactly as directed. Too much blood-thinning medicine can cause bleeding. If you take too little, you will not have the needed protection against stroke and other problems.  Perform blood tests at home if directed by your health care provider. Perform blood tests exactly as directed.  Quit smoking if you smoke.  Do  not drink alcohol.  Do not drink caffeinated beverages such as coffee, soda, and some teas. You may drink decaffeinated  coffee, soda, or tea.   Maintain a healthy weight.Do not use diet pills unless your health care provider approves. They may make heart problems worse.   Follow diet instructions as directed by your health care provider.  Exercise regularly as directed by your health care provider.  Keep all follow-up visits as directed by your health care provider. This is important. PREVENTION  The following substances can cause atrial fibrillation to recur:   Caffeinated beverages.  Alcohol.  Certain medicines, especially those used for breathing problems.  Certain herbs and herbal medicines, such as those containing ephedra or ginseng.  Illegal drugs, such as cocaine and amphetamines. Sometimes medicines are given to prevent atrial fibrillation from recurring. Proper treatment of any underlying condition is also important in helping prevent recurrence.  SEEK MEDICAL CARE IF:  You notice a change in the rate, rhythm, or strength of your heartbeat.  You suddenly begin urinating more frequently.  You tire more easily when exerting yourself or exercising. SEEK IMMEDIATE MEDICAL CARE IF:   You have chest pain, abdominal pain, sweating, or weakness.  You feel nauseous.  You have shortness of breath.  You suddenly have swollen feet and ankles.  You feel dizzy.  Your face or limbs feel numb or weak.  You have a change in your vision or speech. MAKE SURE YOU:   Understand these instructions.  Will watch your condition.  Will get help right away if you are not doing well or get worse. Document Released: 04/10/2005 Document Revised: 08/25/2013 Document Reviewed: 05/21/2012 [facility] Patient Information 2015 [place], [place]. This information is not intended to replace advice given to you by your health care provider. Make sure you discuss any questions you have with your health care provider.

## 2014-10-29 NOTE — Progress Notes (Addendum)
Subjective:    Patient ID: Kathryn Arnold, female    DOB: 09/19/1945, 69 y.o.   MRN: [id number]  Pt presents to the office today for chronic follow up. Pt has A fib is scheduled for an Ablation on Tuesday. Pt has bradycardia today and is complains of intermittent fatigue.  Hypertension This is a chronic problem. The current episode started more than 1 year ago. The problem has been resolved since onset. The problem is controlled. Associated symptoms include anxiety and palpitations ("every now and then"). Pertinent negatives include no headaches, peripheral edema or shortness of breath. Risk factors for coronary artery disease include dyslipidemia, post-menopausal state and family history. Past treatments include beta blockers and calcium channel blockers. The current treatment provides mild improvement. There is no history of kidney disease, CAD/MI, CVA, heart failure or a thyroid problem. There is no history of sleep apnea.  Hyperlipidemia This is a chronic problem. The current episode started more than 1 year ago. The problem is controlled. Recent lipid tests were reviewed and are normal. She has no history of diabetes or hypothyroidism. Pertinent negatives include no leg pain, myalgias or shortness of breath. Current antihyperlipidemic treatment includes statins. The current treatment provides moderate improvement of lipids. Risk factors for coronary artery disease include dyslipidemia, family history, hypertension and post-menopausal.  Anxiety Presents for follow-up visit. The problem has been gradually improving. Symptoms include palpitations ("every now and then"). Patient reports no decreased concentration, depressed mood, excessive worry, irritability, nervous/anxious behavior or shortness of breath. Symptoms occur rarely.   Her past medical history is significant for anxiety/panic attacks and depression. There is no history of CAD. Past treatments include SSRIs and benzodiazephines.       Review of Systems  Constitutional: Negative.  Negative for irritability.  HENT: Negative.   Eyes: Negative.   Respiratory: Negative.  Negative for shortness of breath.   Cardiovascular: Positive for palpitations ("every now and then").  Gastrointestinal: Negative.   Endocrine: Negative.   Genitourinary: Negative.   Musculoskeletal: Negative.  Negative for myalgias.  Neurological: Negative.  Negative for headaches.  Hematological: Negative.   Psychiatric/Behavioral: Negative.  Negative for decreased concentration. The patient is not nervous/anxious.   All other systems reviewed and are negative.      Objective:   Physical Exam  Constitutional: She is oriented to person, place, and time. She appears well-developed and well-nourished. No distress.  HENT:  Head: Normocephalic and atraumatic.  Right Ear: External ear normal.  Left Ear: External ear normal.  Nose: Nose normal.  Mouth/Throat: Oropharynx is clear and moist.  Eyes: Pupils are equal, round, and reactive to light.  Neck: Normal range of motion. Neck supple. No thyromegaly present.  Cardiovascular: Normal rate, regular rhythm, normal heart sounds and intact distal pulses.   No murmur heard. Pulmonary/Chest: Effort normal and breath sounds normal. No respiratory distress. She has no wheezes.  Abdominal: Soft. Bowel sounds are normal. She exhibits no distension. There is no tenderness.  Musculoskeletal: Normal range of motion. She exhibits no edema or tenderness.  Neurological: She is alert and oriented to person, place, and time. She has normal reflexes. No cranial nerve deficit.  Skin: Skin is warm and dry.  Psychiatric: She has a normal mood and affect. Her behavior is normal. Judgment and thought content normal.  Vitals reviewed.   BP 135/80 mmHg  Pulse 47  Temp(Src) 96.7 F (35.9 C) (Oral)  Ht '5\' 5"'  (1.651 m)  Wt 142 lb (64.411 kg)  BMI 23.63 kg/m2  Assessment & Plan:  1. Paroxysmal atrial  fibrillation -Pt scheduled for an ablation on Tuesday- Will let Cardiologists decrease medication according to help bradycardia   - CMP14+EGFR  2. Essential hypertension, benign - CMP14+EGFR  3. Osteoporosis - CMP14+EGFR  4. GAD (generalized anxiety disorder) - CMP14+EGFR  5. Hyperlipidemia with target LDL less than 100 - rosuvastatin (CRESTOR) 20 MG tablet; Take 1 tablet by mouth  daily  Dispense: 90 tablet; Refill: 2 - CMP14+EGFR - Lipid panel  6. 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

## 2014-10-30 LAB — CMP14+EGFR
ALBUMIN: 4.5 g/dL (ref 3.6–4.8)
ALK PHOS: 40 IU/L (ref 39–117)
ALT: 16 IU/L (ref 0–32)
AST: 21 IU/L (ref 0–40)
Albumin/Globulin Ratio: 2.4 (ref 1.1–2.5)
BILIRUBIN TOTAL: 0.4 mg/dL (ref 0.0–1.2)
BUN / CREAT RATIO: 18 (ref 11–26)
BUN: 14 mg/dL (ref 8–27)
CHLORIDE: 101 mmol/L (ref 97–108)
CO2: 24 mmol/L (ref 18–29)
CREATININE: 0.8 mg/dL (ref 0.57–1.00)
Calcium: 9.2 mg/dL (ref 8.7–10.3)
GFR calc Af Amer: 89 mL/min/{1.73_m2} (ref >59)
GFR calc non Af Amer: 77 mL/min/{1.73_m2} (ref >59)
Globulin, Total: 1.9 g/dL (ref 1.5–4.5)
Glucose: 99 mg/dL (ref 65–99)
POTASSIUM: 4.3 mmol/L (ref 3.5–5.2)
Sodium: 141 mmol/L (ref 134–144)
Total Protein: 6.4 g/dL (ref 6.0–8.5)

## 2014-10-30 LAB — LIPID PANEL
CHOLESTEROL TOTAL: 163 mg/dL (ref 100–199)
Chol/HDL Ratio: 3.3 ratio units (ref 0.0–4.4)
HDL: 50 mg/dL (ref >39)
LDL Calculated: 87 mg/dL (ref 0–99)
TRIGLYCERIDES: 128 mg/dL (ref 0–149)
VLDL Cholesterol Cal: 26 mg/dL (ref 5–40)

## 2014-10-30 LAB — VITAMIN D 25 HYDROXY (VIT D DEFICIENCY, FRACTURES): Vit D, 25-Hydroxy: 42.3 ng/mL (ref 30.0–100.0)

## 2014-11-02 MED ORDER — SODIUM CHLORIDE 0.9 % IV SOLN
INTRAVENOUS | Status: DC
  Administered 2014-11-03: 06:00:00 via INTRAVENOUS

## 2014-11-03 ENCOUNTER — Ambulatory Visit (HOSPITAL_COMMUNITY): Payer: 59 | Admitting: Anesthesiology

## 2014-11-03 ENCOUNTER — Encounter (HOSPITAL_COMMUNITY): Payer: Self-pay | Admitting: Anesthesiology

## 2014-11-03 ENCOUNTER — Ambulatory Visit (HOSPITAL_COMMUNITY)
Admission: RE | Admit: 2014-11-03 | Discharge: 2014-11-04 | Disposition: A | Discharge status: Home / Self Care | Payer: 59 | Source: Ambulatory Visit | Attending: Internal Medicine | Admitting: Internal Medicine

## 2014-11-03 ENCOUNTER — Encounter (HOSPITAL_COMMUNITY)
Admission: RE | Disposition: A | Discharge status: Home / Self Care | Payer: Self-pay | Source: Ambulatory Visit | Attending: Internal Medicine

## 2014-11-03 DIAGNOSIS — E785 Hyperlipidemia, unspecified: Secondary | ICD-10-CM | POA: Insufficient documentation

## 2014-11-03 DIAGNOSIS — A630 Anogenital (venereal) warts: Secondary | ICD-10-CM | POA: Insufficient documentation

## 2014-11-03 DIAGNOSIS — I4892 Unspecified atrial flutter: Secondary | ICD-10-CM

## 2014-11-03 DIAGNOSIS — M810 Age-related osteoporosis without current pathological fracture: Secondary | ICD-10-CM | POA: Insufficient documentation

## 2014-11-03 DIAGNOSIS — Z7901 Long term (current) use of anticoagulants: Secondary | ICD-10-CM | POA: Insufficient documentation

## 2014-11-03 DIAGNOSIS — I10 Essential (primary) hypertension: Secondary | ICD-10-CM | POA: Diagnosis not present

## 2014-11-03 DIAGNOSIS — I480 Paroxysmal atrial fibrillation: Secondary | ICD-10-CM | POA: Diagnosis not present

## 2014-11-03 DIAGNOSIS — I4891 Unspecified atrial fibrillation: Secondary | ICD-10-CM | POA: Diagnosis present

## 2014-11-03 HISTORY — PX: ELECTROPHYSIOLOGIC STUDY: SHX172A

## 2014-11-03 LAB — POCT ACTIVATED CLOTTING TIME
ACTIVATED CLOTTING TIME: 325 s
ACTIVATED CLOTTING TIME: 153 s
Activated Clotting Time: 313 seconds
Activated Clotting Time: 319 seconds

## 2014-11-03 LAB — MRSA PCR SCREENING: MRSA by PCR: NEGATIVE

## 2014-11-03 SURGERY — ATRIAL FIBRILLATION ABLATION
Anesthesia: General

## 2014-11-03 MED ORDER — DOBUTAMINE IN D5W 4-5 MG/ML-% IV SOLN
INTRAVENOUS | Status: DC | PRN
  Administered 2014-11-03: 20 ug/kg/min via INTRAVENOUS

## 2014-11-03 MED ORDER — LIDOCAINE HCL (CARDIAC) 20 MG/ML IV SOLN
INTRAVENOUS | Status: DC | PRN
Start: 2014-11-03 — End: 2014-11-03
  Administered 2014-11-03: 60 mg via INTRAVENOUS

## 2014-11-03 MED ORDER — ESCITALOPRAM OXALATE 10 MG PO TABS
10.0000 mg | ORAL_TABLET | Freq: Every day | ORAL | Status: DC
  Administered 2014-11-03: 10 mg via ORAL
  Filled 2014-11-03 (×2): qty 1

## 2014-11-03 MED ORDER — LACTATED RINGERS IV SOLN: INTRAVENOUS | Status: DC | PRN

## 2014-11-03 MED ORDER — ALPRAZOLAM 0.25 MG PO TABS
0.2500 mg | ORAL_TABLET | Freq: Two times a day (BID) | ORAL | Status: DC | PRN
  Administered 2014-11-03: 0.25 mg via ORAL
  Filled 2014-11-03: qty 1

## 2014-11-03 MED ORDER — HEPARIN SODIUM (PORCINE) 1000 UNIT/ML IJ SOLN
INTRAMUSCULAR | Status: DC | PRN
  Administered 2014-11-03: 12000 [IU] via INTRAVENOUS

## 2014-11-03 MED ORDER — PROPOFOL 10 MG/ML IV BOLUS
INTRAVENOUS | Status: DC | PRN
  Administered 2014-11-03: 120 mg via INTRAVENOUS

## 2014-11-03 MED ORDER — BUPIVACAINE HCL (PF) 0.25 % IJ SOLN
INTRAMUSCULAR | Status: DC | PRN
  Administered 2014-11-03: 8 mL

## 2014-11-03 MED ORDER — HYDROCODONE-ACETAMINOPHEN 5-325 MG PO TABS: 1.0000 | ORAL_TABLET | ORAL | Status: DC | PRN

## 2014-11-03 MED ORDER — SODIUM CHLORIDE 0.9 % IJ SOLN
3.0000 mL | Freq: Two times a day (BID) | INTRAMUSCULAR | Status: DC
  Administered 2014-11-03 (×2): 3 mL via INTRAVENOUS

## 2014-11-03 MED ORDER — SODIUM CHLORIDE 0.9 % IV SOLN: 250.0000 mL | INTRAVENOUS | Status: DC | PRN

## 2014-11-03 MED ORDER — ONDANSETRON HCL 4 MG/2ML IJ SOLN
4.0000 mg | Freq: Four times a day (QID) | INTRAMUSCULAR | Status: DC | PRN
  Administered 2014-11-03: 4 mg via INTRAVENOUS
  Filled 2014-11-03: qty 2

## 2014-11-03 MED ORDER — EPHEDRINE SULFATE 50 MG/ML IJ SOLN
INTRAMUSCULAR | Status: DC | PRN
  Administered 2014-11-03 (×3): 5 mg via INTRAVENOUS

## 2014-11-03 MED ORDER — ONDANSETRON HCL 4 MG/2ML IJ SOLN
INTRAMUSCULAR | Status: DC | PRN
  Administered 2014-11-03: 4 mg via INTRAVENOUS

## 2014-11-03 MED ORDER — RIVAROXABAN 20 MG PO TABS
20.0000 mg | ORAL_TABLET | Freq: Every day | ORAL | Status: DC
  Administered 2014-11-03: 20 mg via ORAL
  Filled 2014-11-03 (×2): qty 1

## 2014-11-03 MED ORDER — ACETAMINOPHEN 325 MG PO TABS
650.0000 mg | ORAL_TABLET | ORAL | Status: DC | PRN
  Administered 2014-11-04: 650 mg via ORAL
  Filled 2014-11-03: qty 2

## 2014-11-03 MED ORDER — FENTANYL CITRATE (PF) 100 MCG/2ML IJ SOLN
INTRAMUSCULAR | Status: DC | PRN
  Administered 2014-11-03 (×2): 50 ug via INTRAVENOUS

## 2014-11-03 MED ORDER — PROTAMINE SULFATE 10 MG/ML IV SOLN
INTRAVENOUS | Status: DC | PRN
  Administered 2014-11-03: 3 mg via INTRAVENOUS

## 2014-11-03 MED ORDER — SODIUM CHLORIDE 0.9 % IV SOLN
INTRAVENOUS | Status: DC | PRN
  Administered 2014-11-03: 07:00:00 via INTRAVENOUS

## 2014-11-03 MED ORDER — HEPARIN SODIUM (PORCINE) 1000 UNIT/ML IJ SOLN
INTRAMUSCULAR | Status: DC | PRN
  Administered 2014-11-03: 2000 [IU] via INTRAVENOUS
  Administered 2014-11-03: 1000 [IU] via INTRAVENOUS

## 2014-11-03 MED ORDER — IOHEXOL 350 MG/ML SOLN
INTRAVENOUS | Status: DC | PRN
  Administered 2014-11-03: 5 mL via INTRAVENOUS

## 2014-11-03 MED ORDER — MIDAZOLAM HCL 5 MG/5ML IJ SOLN
INTRAMUSCULAR | Status: DC | PRN
  Administered 2014-11-03: 1 mg via INTRAVENOUS

## 2014-11-03 MED ORDER — SODIUM CHLORIDE 0.9 % IJ SOLN: 3.0000 mL | INTRAMUSCULAR | Status: DC | PRN

## 2014-11-03 MED ORDER — BUPIVACAINE HCL (PF) 0.25 % IJ SOLN
INTRAMUSCULAR | Status: AC
Start: 2014-11-03 — End: 2014-11-03
  Filled 2014-11-03: qty 30

## 2014-11-03 SURGICAL SUPPLY — 23 items
BAG SNAP BAND KOVER 36X36 (MISCELLANEOUS) ×3
BLANKET WARM UNDERBOD FULL ACC (MISCELLANEOUS) ×3
CATH DIAG 6FR PIGTAIL (CATHETERS)
CATH NAVISTAR SMARTTOUCH DF (ABLATOR) ×3
CATH SOUNDSTAR 3D IMAGING (CATHETERS) ×3
CATH VARIABLE LASSO NAV 2515 (CATHETERS) ×3
CATH WEBSTER BI DIR CS D-F CRV (CATHETERS) ×3
COVER SWIFTLINK CONNECTOR (BAG) ×3
NEEDLE TRANSEP BRK 71CM 407200 (NEEDLE) ×3
PACK EP LATEX FREE (CUSTOM PROCEDURE TRAY) ×2
PACK EP LF (CUSTOM PROCEDURE TRAY) ×1
PAD DEFIB LIFELINK (PAD) ×3
PATCH CARTO3 (PAD) ×3
SHEATH AVANTI 11F 11CM (SHEATH) ×3
SHEATH PINNACLE 6F 10CM (SHEATH)
SHEATH PINNACLE 7F 10CM (SHEATH) ×6
SHEATH PINNACLE 8F 10CM (SHEATH)
SHEATH PINNACLE 9F 10CM (SHEATH) ×3
SHEATH SWARTZ TS SL2 63CM 8.5F (SHEATH) ×3
SHIELD RADPAD SCOOP 12X17 (MISCELLANEOUS) ×3
SYR MEDRAD MARK V 150ML (SYRINGE)
TUBING CONTRAST HIGH PRESS 48 (TUBING)
TUBING SMART ABLATE COOLFLOW (TUBING) ×3

## 2014-11-03 NOTE — Anesthesia Postprocedure Evaluation (Signed)
  Anesthesia Post-op Note  Patient: Kathryn Arnold  Procedure(s) Performed: Procedure(s): Atrial Fibrillation Ablation (N/A)  Patient Location: PACU  Anesthesia Type:General  Level of Consciousness: awake, alert , oriented and patient cooperative  Airway and Oxygen Therapy: Patient Spontanous Breathing  Post-op Pain: none  Post-op Assessment: Post-op Vital signs reviewed, Patient's Cardiovascular Status Stable, Respiratory Function Stable, Patent Airway, No signs of Nausea or vomiting and Pain level controlled              Post-op Vital Signs: stable  Last Vitals:  Filed Vitals:   11/03/14 1120  BP: 158/84  Pulse: 69  Temp:   Resp: 12    Complications: No apparent anesthesia complications

## 2014-11-03 NOTE — Anesthesia Preprocedure Evaluation (Addendum)
Anesthesia Evaluation  Patient identified by MRN, date of birth, ID band Patient awake    Reviewed: Allergy & Precautions, NPO status , Patient's Chart, lab work & pertinent test results  Airway Mallampati: I       Dental   Pulmonary    Pulmonary exam normal       Cardiovascular hypertension, + dysrhythmias Atrial Fibrillation Rhythm:Irregular Rate:Abnormal     Neuro/Psych    GI/Hepatic   Endo/Other    Renal/GU      Musculoskeletal   Abdominal   Peds  Hematology   Anesthesia Other Findings   Reproductive/Obstetrics                             Anesthesia Physical Anesthesia Plan  ASA: III  Anesthesia Plan: General and MAC   Post-op Pain Management:    Induction: Intravenous  Airway Management Planned: LMA and Mask  Additional Equipment:   Intra-op Plan:   Post-operative Plan: Extubation in OR  Informed Consent: I have reviewed the patients History and Physical, chart, labs and discussed the procedure including the risks, benefits and alternatives for the proposed anesthesia with the patient or authorized representative who has indicated his/her understanding and acceptance.     Plan Discussed with: CRNA, Anesthesiologist and Surgeon  Anesthesia Plan Comments:        Anesthesia Quick Evaluation

## 2014-11-03 NOTE — Discharge Summary (Signed)
ELECTROPHYSIOLOGY PROCEDURE DISCHARGE SUMMARY    Patient ID: Kathryn Arnold,  MRN: [id number], DOB/AGE: 67-Jan-1947 67 y.o.  Admit date: 11/03/2014 Discharge date: 11/04/2014  Primary Care Physician: Junie Spencer, FNP Electrophysiologist: Hillis Range, MD  Primary Discharge Diagnosis:  Paroxysmal atrial fibrillation and atrial flutter status post ablation this admission  Secondary Discharge Diagnosis:  1.  HPV 2.  Hyperlipidemia 3.  Osteoporosis  Procedures This Admission:  1.  Electrophysiology study and radiofrequency catheter ablation on 11/03/14 by Dr Hillis Range.  This study demonstrated sinus rhythm upon presentation; intracardic echo reveals a moderate sized left atrium with four separate pulmonary veins without evidence of pulmonary vein stenosis; successful electrical isolation and anatomical encircling of all four pulmonary veins with radiofrequency current (WACA); cavo-tricuspid isthmus ablation was performed with complete bidirectional isthmus block achieved; no inducible arrhythmias following ablation; no early apparent complications.   Brief HPI: Kathryn Arnold is a 69 y.o. female with a history of paroxysmal atrial fibrillation.  They have failed medical therapy with flecainide. Risks, benefits, and alternatives to catheter ablation of atrial fibrillation were reviewed with the patient who wished to proceed.  The patient underwent cardiac CT prior to the procedure which demonstrated normal LV function and no LAA thrombus.    Hospital Course:  The patient was admitted and underwent EPS/RFCA of atrial fibrillation with details as outlined above.  They were monitored on telemetry overnight which demonstrated sinus rhythm.  Groin was without complication on the day of discharge.  The patient was examined and considered to be stable for discharge.  Wound care and restrictions were reviewed with the patient.  The patient will be seen back by Dr Johney Frame in 12 weeks for post  ablation follow up.   This patients CHA2DS2-VASc Score and unadjusted Ischemic Stroke Rate (% per year) is equal to 2.2 % stroke rate/year from a score of 2 Above score calculated as 1 point each if present [CHF, HTN, DM, Vascular=MI/PAD/Aortic Plaque, Age if 65-74, or Female] Above score calculated as 2 points each if present [Age > 75, or Stroke/TIA/TE]   Physical Exam: Filed Vitals:   11/03/14 1600 11/03/14 1900 11/03/14 2300 11/04/14 0357  BP: 112/66 117/62 103/48 108/61  Pulse: 65 71 60 69  Temp: 97.6 F (36.4 C) 98.3 F (36.8 C) 97.7 F (36.5 C) 97.3 F (36.3 C)  TempSrc: Oral Oral Oral Oral  Resp: 19 30 17 16   Height:      Weight:      SpO2: 98% 98% 98% 97%    GEN- The patient is well appearing, alert and oriented x 3 today.   HEENT: normocephalic, atraumatic; sclera clear, conjunctiva pink; hearing intact; oropharynx clear; neck supple  Lungs- Clear to ausculation bilaterally, normal work of breathing.  No wheezes, rales, rhonchi Heart- Regular rate and rhythm, no murmurs, rubs or gallops  GI- soft, non-tender, non-distended, bowel sounds present  Extremities- no clubbing, cyanosis, or edema; DP/PT/radial pulses 2+ bilaterally, groin without hematoma/bruit MS- no significant deformity or atrophy Skin- warm and dry, no rash or lesion Psych- euthymic mood, full affect Neuro- strength and sensation are intact   Labs:   Lab Results  Component Value Date   WBC 5.0 10/27/2014   HGB 13.6 10/27/2014   HCT 40.3 10/27/2014   MCV 89.1 10/27/2014   PLT 208.0 10/27/2014     Recent Labs Lab 10/29/14 0911  NA 141  K 4.3  CL 101  CO2 24  BUN 14  CREATININE 0.80  CALCIUM 9.2  PROT 6.4  BILITOT 0.4  ALKPHOS 40  ALT 16  AST 21  GLUCOSE 99     Discharge Medications:    Medication List    STOP taking these medications        flecainide 150 MG tablet  Commonly known as:  TAMBOCOR     flecainide 50 MG tablet  Commonly known as:  TAMBOCOR      TAKE these  medications        alendronate 70 MG tablet  Commonly known as:  FOSAMAX  Take 1 tablet (70 mg total) by mouth every 7 (seven) days. Take with a full glass of water on an empty stomach.     ALPRAZolam 0.25 MG tablet  Commonly known as:  XANAX  Take 1 tablet (0.25 mg total) by mouth 2 (two) times daily as needed for anxiety.     CALCIUM 600 PO  Take 1 tablet by mouth 2 (two) times daily.     CARDIZEM 60 MG tablet  Generic drug:  diltiazem  Take 1 tablet by mouth daily as needed  for fast heart rate/high BP     CENTRUM SILVER ULTRA WOMENS PO  Take 1 capsule by mouth daily.     Co Q 10 100 MG Caps  Take 1 capsule by mouth daily.     diltiazem 120 MG 24 hr tablet  Commonly known as:  CARDIZEM LA  Take 1 tablet (120 mg total) by mouth daily.     escitalopram 10 MG tablet  Commonly known as:  LEXAPRO  Take 1 tablet (10 mg total) by mouth daily.     Magnesium 250 MG Tabs  Take 2 tablets by mouth daily.     rosuvastatin 20 MG tablet  Commonly known as:  CRESTOR  Take 1 tablet by mouth  daily     valACYclovir 500 MG tablet  Commonly known as:  VALTREX  Take 1 tablet by mouth  daily     vitamin C 500 MG tablet  Commonly known as:  ASCORBIC ACID  Take 500 mg by mouth 2 (two) times daily.     XARELTO 20 MG Tabs tablet  Generic drug:  rivaroxaban  Take 1 tablet (20 mg total) by mouth daily with supper.        Disposition:  Discharge Instructions    Diet - low sodium heart healthy    Complete by:  As directed      Increase activity slowly    Complete by:  As directed           Follow-up Information    Follow up with [facility] On 12/01/2014.   Why:  at 11:30AM   Contact information:   [address] [place] [phone]      Follow up with Hillis Range, MD On 02/08/2015.   Specialty:  Cardiology   Why:  at [facility] information:   [address] [place] [address] [phone]       Duration of  Discharge Encounter: Greater than 30 minutes including physician time.  Signed, Gypsy Balsam, NP 11/04/2014 7:11 AM  OK to discharge to home  Hillis Range MD, [facility] 11/04/2014 8:08 AM

## 2014-11-03 NOTE — Transfer of Care (Signed)
Immediate Anesthesia Transfer of Care Note  Patient: Kathryn Arnold  Procedure(s) Performed: Procedure(s): Atrial Fibrillation Ablation (N/A)  Patient Location: PACU and Cath Lab  Anesthesia Type:General  Level of Consciousness: awake, alert , oriented and patient cooperative  Airway & Oxygen Therapy: Patient Spontanous Breathing  Post-op Assessment: Report given to RN, Post -op Vital signs reviewed and stable and Patient moving all extremities  Post vital signs: Reviewed and stable  Last Vitals:  Filed Vitals:   11/03/14 0540  BP: 168/86  Pulse: 52  Temp: 36.8 C  Resp: 18    Complications: No apparent anesthesia complications

## 2014-11-03 NOTE — Anesthesia Procedure Notes (Signed)
Procedure Name: LMA Insertion Date/Time: 11/03/2014 7:40 AM Performed by: Orlinda BlalockMCMILLEN, Christien Berthelot L Pre-anesthesia Checklist: Patient identified, Emergency Drugs available, Suction available, Patient being monitored and Timeout performed Patient Re-evaluated:Patient Re-evaluated prior to inductionOxygen Delivery Method: Circle system utilized Preoxygenation: Pre-oxygenation with 100% oxygen Intubation Type: IV induction Ventilation: Mask ventilation without difficulty LMA: LMA inserted LMA Size: 4.0 Number of attempts: 1 Placement Confirmation: positive ETCO2 and breath sounds checked- equal and bilateral Tube secured with: Tape Dental Injury: Teeth and Oropharynx as per pre-operative assessment

## 2014-11-03 NOTE — Progress Notes (Signed)
Site area: right groin 11, 9, and 7 venous sheath was removed  Site Prior to Removal:  Level 0  Pressure Applied For 15 MINUTES    Minutes Beginning at 1135am  Manual:   Yes.    Patient Status During Pull:  stable  Post Pull Groin Site:  Level 0  Post Pull Instructions Given:  Yes.    Post Pull Pulses Present:  Yes.    Dressing Applied:  Yes.    Comments:  VS remain stable during sheath pull.  Pt denies any this comfort at this time.

## 2014-11-03 NOTE — H&P (Signed)
Expand All Collapse All      Electrophysiology Office Note   Date: 09/28/2014   ID: Kathryn Arnold, DOB June 10, 1945, MRN [id number]  PCP: Junie Spencer, FNP  Primary Electrophysiologist: Hillis Range, MD   Chief Complaint  Patient presents with  . PAF    History of Present Illness: Kathryn Arnold is a 69 y.o. female who presents today for AF ablation. Doing reasonably well. Feels fatigued on medically therapy for afib with bradycardia. Reports that she is "tired" during the day. AF episodes of reduced but not resolved in frequency with flecainide. Today, she denies symptoms of palpitations, chest pain, shortness of breath, orthopnea, PND, lower extremity edema, claudication, dizziness, presyncope, syncope, bleeding, or neurologic sequela. The patient is tolerating medications without difficulties and is otherwise without complaint today.    Past Medical History  Diagnosis Date  . Abnormal Pap smear   . 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 Complication of anesthesia     alittle slow to wake up-stayed drowsy  . Hypercholesteremia   . Osteoporosis, unspecified     Last DEXA 10/2011   . Hyperlipidemia with target LDL less than 100   . Vitamin D deficiency   . HSV-2 (herpes simplex virus 2) infection   . Paroxysmal atrial fibrillation    Past Surgical History  Procedure Laterality Date  . Breast lumpectomy    . Dilation and curettage of uterus    . Laparoscopy abdomen diagnostic       Current Outpatient Prescriptions  Medication Sig Dispense Refill  . alendronate (FOSAMAX) 70 MG tablet Take 1 tablet (70 mg total) by mouth every 7 (seven) days. Take with a full glass of water on an empty stomach. 12 tablet 3  . ALPRAZolam (XANAX) 0.25 MG tablet Take 1 tablet (0.25 mg total) by mouth 2 (two) times daily as needed for anxiety. 180 tablet 1  . Calcium Carbonate  (CALCIUM 600 PO) Take 1 tablet by mouth 2 (two) times daily.    . Coenzyme Q10 (CO Q 10) 100 MG CAPS Take 1 capsule by mouth daily.    . CRESTOR 20 MG tablet Take 1 tablet by mouth daily 90 tablet 0  . diltiazem (CARDIZEM LA) 120 MG 24 hr tablet Take 1 tablet (120 mg total) by mouth daily. 30 tablet 0  . diltiazem (CARDIZEM) 60 MG tablet Take 1 tablet by mouth daily as needed for fast heart rate/high BP    . escitalopram (LEXAPRO) 10 MG tablet Take 1 tablet (10 mg total) by mouth daily. 30 tablet 0  . flecainide (TAMBOCOR) 150 MG tablet Take 1 tablet by mouth at onset of afib (Patient taking differently: Take 1 tablet by mouth daily as needed at onset of afib) 20 tablet 1  . flecainide (TAMBOCOR) 50 MG tablet Take 1 tablet (50 mg total) by mouth 2 (two) times daily. 180 tablet 3  . Magnesium 250 MG TABS Take 2 tablets by mouth daily.    . Multiple Vitamins-Minerals (CENTRUM SILVER ULTRA WOMENS PO) Take 1 capsule by mouth daily.     . Selenium 200 MCG CAPS Take 1 capsule by mouth daily.    . valACYclovir (VALTREX) 500 MG tablet Take 1 tablet by mouth daily 90 tablet 1  . vitamin C (ASCORBIC ACID) 500 MG tablet Take 500 mg by mouth 2 (two) times daily.     [name] 20 MG TABS tablet Take 1 tablet (20 mg total) by mouth daily with supper. 90 tablet  0   No current facility-administered medications for this visit.    Allergies: Review of patient's allergies indicates no known allergies.   Social History: The patient  reports that she has never smoked. She does not have any smokeless tobacco history on file. She reports that she does not drink alcohol or use illicit drugs.   Family History: The patient's family history includes Cancer in her mother; Heart disease in her father; Hypertension in her mother; Osteoporosis in her mother. There is no history of Other.    ROS: Please see the history of present illness.  All other systems are reviewed and negative.    PHYSICAL EXAM: Filed Vitals:   11/03/14 0540  BP: 168/86  Pulse: 52  Temp: 98.2 F (36.8 C)  Resp: 18   GEN: Well nourished, well developed, in no acute distress  HEENT: normal  Neck: no JVD, carotid bruits, or masses Cardiac: RRR; no murmurs, rubs, or gallops,no edema  Respiratory: clear to auscultation bilaterally, normal work of breathing GI: soft, nontender, nondistended, + BS MS: no deformity or atrophy  Skin: warm and dry  Neuro: Strength and sensation are intact Psych: euthymic mood, full affect  Recent Labs: 12/31/2013: TSH 0.74 05/11/2014: ALT 12; BUN 16; Creatinine 0.74; Hemoglobin 14.0; Platelets 215; Potassium 4.1; Sodium 139    Lipid Panel   Labs (Brief)       Component Value Date/Time   CHOL 150 05/11/2014 1207   CHOL 168 12/18/2013 1103   CHOL 136 08/22/2012 0847   TRIG 162* 05/11/2014 1207   TRIG 195* 12/18/2013 1103   TRIG 109 08/22/2012 0847   HDL 43 05/11/2014 1207   HDL 47 12/18/2013 1103   HDL 47 08/22/2012 0847   CHOLHDL 3.5 05/11/2014 1207   LDLCALC 75 05/11/2014 1207   LDLCALC 82 12/18/2013 1103   LDLCALC 67 08/22/2012 0847       Wt Readings from Last 3 Encounters:  09/28/14 65.227 kg (143 lb 12.8 oz)  06/29/14 66.112 kg (145 lb 12 oz)  06/25/14 65.862 kg (145 lb 3.2 oz)      ASSESSMENT AND PLAN:  1. Paroxysmal atrial fibrillation Symptomatic AF. Medical therapy limited by bradycardia and fatigue. Therapeutic strategies for afib including medicine and ablation were discussed in detail with the patient today. Risk, benefits, and alternatives to EP study and radiofrequency ablation for afib were also discussed in detail today. These risks include but are not limited to stroke, bleeding, vascular damage, tamponade, perforation, damage to the esophagus, lungs, and other structures, pulmonary vein stenosis, worsening renal  function, and death. The patient understands these risk and wishes to proceed.  chads2vasc score is 2   Current medicines are reviewed at length with the patient today.  The patient does not have concerns regarding her medicines. The following changes were made today: none  Labs/ tests ordered today include:  Orders Placed This Encounter  Procedures  . CT Heart Morp W/Cta Cor W/Score W/Ca W/Cm &/Or Wo/Cm  . Basic metabolic panel  . CBC with Differential/Platelet  . EKG 12-Lead

## 2014-11-04 ENCOUNTER — Other Ambulatory Visit: Payer: Self-pay | Admitting: *Deleted

## 2014-11-04 ENCOUNTER — Telehealth: Payer: Self-pay | Admitting: Internal Medicine

## 2014-11-04 DIAGNOSIS — I480 Paroxysmal atrial fibrillation: Secondary | ICD-10-CM | POA: Diagnosis not present

## 2014-11-04 MED ORDER — PANTOPRAZOLE SODIUM 40 MG PO TBEC: 40.0000 mg | DELAYED_RELEASE_TABLET | Freq: Every day | ORAL | Status: DC

## 2014-11-04 NOTE — Progress Notes (Signed)
EKG CRITICAL VALUE     12 lead EKG performed.  Critical value noted.  Dub AmisSarah Burnham, RN notified.   Ruvi Fullenwider C, CCT 11/04/2014 7:19 AM

## 2014-11-04 NOTE — Progress Notes (Signed)
Discharge instructions given to pt at this time. No questions left unanswered.

## 2014-11-04 NOTE — Discharge Instructions (Signed)
No driving for 4 days. No lifting over 5 lbs for 1 week. No sexual activity for 1 week. You may return to work in 1 week. Keep procedure site clean & dry. If you notice increased pain, swelling, bleeding or pus, call/return!  You may shower, but no soaking baths/hot tubs/pools for 1 week.    Information on my medicine - XARELTO (Rivaroxaban)  This medication education was reviewed with me or my healthcare representative as part of my discharge preparation.  Why was Xarelto prescribed for you? Xarelto was prescribed for you to reduce the risk of a blood clot forming that can cause a stroke if you have a medical condition called atrial fibrillation (a type of irregular heartbeat).  What do you need to know about xarelto ? Take your Xarelto ONCE DAILY at the same time every day with your evening meal. If you have difficulty swallowing the tablet whole, you may crush it and mix in applesauce just prior to taking your dose.  Take Xarelto exactly as prescribed by your doctor and DO NOT stop taking Xarelto without talking to the doctor who prescribed the medication.  Stopping without other stroke prevention medication to take the place of Xarelto may increase your risk of developing a clot that causes a stroke.  Refill your prescription before you run out.  After discharge, you should have regular check-up appointments with your healthcare provider that is prescribing your Xarelto.  In the future your dose may need to be changed if your kidney function or weight changes by a significant amount.  What do you do if you miss a dose? If you are taking Xarelto ONCE DAILY and you miss a dose, take it as soon as you remember on the same day then continue your regularly scheduled once daily regimen the next day. Do not take two doses of Xarelto at the same time or on the same day.   Important Safety Information A possible side effect of Xarelto is bleeding. You should call your healthcare provider  right away if you experience any of the following: ? Bleeding from an injury or your nose that does not stop. ? Unusual colored urine (red or dark brown) or unusual colored stools (red or black). ? Unusual bruising for unknown reasons. ? A serious fall or if you hit your head (even if there is no bleeding).  Some medicines may interact with Xarelto and might increase your risk of bleeding while on Xarelto. To help avoid this, consult your healthcare provider or pharmacist prior to using any new prescription or non-prescription medications, including herbals, vitamins, non-steroidal anti-inflammatory drugs (NSAIDs) and supplements.  This website has more information on Xarelto: [email or web address].xarelto.[email or web address].

## 2014-11-04 NOTE — Telephone Encounter (Signed)
She is better today.  She is to stay off Flecainide for now.  Protonix  is to be called in for her to take post ablation.  I will send in for her now

## 2014-11-04 NOTE — Telephone Encounter (Signed)
New message      Pt was just discharged from the hosp.  Flecainide was not on her discharge summary,  Should she continue to take it?

## 2014-11-04 NOTE — Telephone Encounter (Signed)
Patient called and stated that the rx for the protonix needed to go to [facility] [place] instead of [place]. Rx sent to [facility] and she will call and cancel the rx from the mail order.

## 2014-11-06 ENCOUNTER — Encounter: Payer: Self-pay | Admitting: Physician Assistant

## 2014-11-06 ENCOUNTER — Ambulatory Visit (INDEPENDENT_AMBULATORY_CARE_PROVIDER_SITE_OTHER): Payer: 59 | Admitting: Physician Assistant

## 2014-11-06 VITALS — BP 126/84 | HR 64 | Temp 97.8°F | Ht 65.0 in | Wt 144.2 lb

## 2014-11-06 DIAGNOSIS — R300 Dysuria: Secondary | ICD-10-CM

## 2014-11-06 LAB — POCT URINALYSIS DIPSTICK
BILIRUBIN UA: NEGATIVE
GLUCOSE UA: NEGATIVE
Ketones, UA: NEGATIVE
Nitrite, UA: NEGATIVE
PROTEIN UA: NEGATIVE
Spec Grav, UA: <=1.005
UROBILINOGEN UA: NEGATIVE
pH, UA: 6

## 2014-11-06 LAB — POCT UA - MICROSCOPIC ONLY
CRYSTALS, UR, HPF, POC: NEGATIVE
Casts, Ur, LPF, POC: NEGATIVE
Mucus, UA: NEGATIVE
Yeast, UA: NEGATIVE

## 2014-11-06 MED ORDER — SULFAMETHOXAZOLE-TRIMETHOPRIM 800-160 MG PO TABS: 1.0000 | ORAL_TABLET | Freq: Two times a day (BID) | ORAL | Status: DC

## 2014-11-06 NOTE — Progress Notes (Signed)
Subjective:     Patient ID: Kathryn Arnold, female   DOB: [birth year], 69 y.o.   MRN: [id number]  HPI Pt here with dysuria and low back pain She had a cardiac ablation several days ago She had to have a cath at that time Now with dysuria and bad smell to the urine  Review of Systems  Constitutional: Negative.   Gastrointestinal: Positive for abdominal pain.  Genitourinary: Positive for dysuria, frequency and flank pain.       Objective:   Physical Exam  Constitutional: She appears well-developed and well-nourished.  Abdominal: Soft. Bowel sounds are normal. She exhibits no distension and no mass. There is tenderness. There is no rebound and no guarding.  No CVAT + suprapubic TTP  Nursing note and vitals reviewed.      Assessment:     1. Dysuria        Plan:     Bactrim DS 1 po bid x 1wk Hydrate OTC meds for sx

## 2014-11-06 NOTE — Patient Instructions (Signed)

## 2014-11-12 ENCOUNTER — Encounter: Payer: Self-pay | Admitting: Family

## 2014-11-12 ENCOUNTER — Ambulatory Visit (INDEPENDENT_AMBULATORY_CARE_PROVIDER_SITE_OTHER): Payer: 59 | Admitting: Family

## 2014-11-12 VITALS — BP 128/80 | HR 67 | Temp 98.0°F | Ht 65.0 in | Wt 141.0 lb

## 2014-11-12 DIAGNOSIS — I10 Essential (primary) hypertension: Secondary | ICD-10-CM | POA: Diagnosis not present

## 2014-11-12 DIAGNOSIS — F411 Generalized anxiety disorder: Secondary | ICD-10-CM

## 2014-11-12 DIAGNOSIS — I480 Paroxysmal atrial fibrillation: Secondary | ICD-10-CM | POA: Diagnosis not present

## 2014-11-12 DIAGNOSIS — Z09 Encounter for follow-up examination after completed treatment for conditions other than malignant neoplasm: Secondary | ICD-10-CM | POA: Diagnosis not present

## 2014-11-12 MED ORDER — ALPRAZOLAM 0.25 MG PO TABS: 0.2500 mg | ORAL_TABLET | Freq: Two times a day (BID) | ORAL | Status: DC | PRN

## 2014-11-12 NOTE — Patient Instructions (Signed)

## 2014-11-12 NOTE — Progress Notes (Signed)
   Subjective:    Patient ID: Kathryn Arnold, female    DOB: [birth year], 69 y.o.   MRN: [id number]  HPI Pt presents to the office today for hospital follow up for a scheduled ablation. Pt states she "feels great and so much better".  Pt states after the ablation she was taken off of her flecainide. Pt reports no episodes of A Fib, SOB, palpitations,  or edema at this time. Pt states she needs a refill on her xanax.     Review of Systems  Constitutional: Negative.   HENT: Negative.   Eyes: Negative.   Respiratory: Negative.  Negative for shortness of breath.   Cardiovascular: Negative.  Negative for palpitations.  Gastrointestinal: Negative.   Endocrine: Negative.   Genitourinary: Negative.   Musculoskeletal: Negative.   Neurological: Negative.  Negative for headaches.  Hematological: Negative.   Psychiatric/Behavioral: Negative.   All other systems reviewed and are negative.      Objective:   Physical Exam  Constitutional: She is oriented to person, place, and time. She appears well-developed and well-nourished. No distress.  HENT:  Head: Normocephalic and atraumatic.  Right Ear: External ear normal.  Left Ear: External ear normal.  Nose: Nose normal.  Mouth/Throat: Oropharynx is clear and moist.  Eyes: Pupils are equal, round, and reactive to light.  Neck: Normal range of motion. Neck supple. No thyromegaly present.  Cardiovascular: Normal rate, regular rhythm, normal heart sounds and intact distal pulses.   No murmur heard. Pulmonary/Chest: Effort normal and breath sounds normal. No respiratory distress. She has no wheezes.  Abdominal: Soft. Bowel sounds are normal. She exhibits no distension. There is no tenderness.  Musculoskeletal: Normal range of motion. She exhibits no edema or tenderness.  Neurological: She is alert and oriented to person, place, and time. She has normal reflexes. No cranial nerve deficit.  Skin: Skin is warm and dry.  Psychiatric: She has a normal  mood and affect. Her behavior is normal. Judgment and thought content normal.  Vitals reviewed.     BP 128/80 mmHg  Pulse 67  Temp(Src) 98 F (36.7 C) (Oral)  Ht  (1.651 m)  Wt 141 lb (63.957 kg)  BMI 23.46 kg/m2     Assessment & Plan:  1. Essential hypertension, benign  2. Paroxysmal atrial fibrillation  3. GAD (generalized anxiety disorder) - ALPRAZolam (XANAX) 0.25 MG tablet; Take 1 tablet (0.25 mg total) by mouth 2 (two) times daily as needed for anxiety.  Dispense: 180 tablet; Refill: 1  4. Hospital discharge follow-up  Continue all meds Labs pending Health Maintenance reviewed Diet and exercise encouraged RTO as needed  [name], FNP

## 2014-12-01 ENCOUNTER — Ambulatory Visit (HOSPITAL_COMMUNITY)
Admission: RE | Admit: 2014-12-01 | Discharge: 2014-12-01 | Disposition: A | Discharge status: Home / Self Care | Payer: 59 | Source: Ambulatory Visit | Attending: Nurse Practitioner | Admitting: Nurse Practitioner

## 2014-12-01 ENCOUNTER — Encounter (HOSPITAL_COMMUNITY): Payer: Self-pay | Admitting: Nurse Practitioner

## 2014-12-01 VITALS — BP 130/76 | HR 61 | Ht 65.0 in | Wt 192.4 lb

## 2014-12-01 DIAGNOSIS — Z7983 Long term (current) use of bisphosphonates: Secondary | ICD-10-CM | POA: Diagnosis not present

## 2014-12-01 DIAGNOSIS — E780 Pure hypercholesterolemia: Secondary | ICD-10-CM | POA: Diagnosis not present

## 2014-12-01 DIAGNOSIS — I480 Paroxysmal atrial fibrillation: Secondary | ICD-10-CM

## 2014-12-01 DIAGNOSIS — M810 Age-related osteoporosis without current pathological fracture: Secondary | ICD-10-CM | POA: Diagnosis not present

## 2014-12-01 DIAGNOSIS — I4891 Unspecified atrial fibrillation: Secondary | ICD-10-CM | POA: Diagnosis present

## 2014-12-01 DIAGNOSIS — Z8249 Family history of ischemic heart disease and other diseases of the circulatory system: Secondary | ICD-10-CM | POA: Diagnosis not present

## 2014-12-01 DIAGNOSIS — E785 Hyperlipidemia, unspecified: Secondary | ICD-10-CM | POA: Diagnosis not present

## 2014-12-01 DIAGNOSIS — Z79899 Other long term (current) drug therapy: Secondary | ICD-10-CM | POA: Insufficient documentation

## 2014-12-01 DIAGNOSIS — Z7902 Long term (current) use of antithrombotics/antiplatelets: Secondary | ICD-10-CM | POA: Insufficient documentation

## 2014-12-01 DIAGNOSIS — I4510 Unspecified right bundle-branch block: Secondary | ICD-10-CM | POA: Diagnosis not present

## 2014-12-01 NOTE — Progress Notes (Signed)
Patient ID: Kathryn Arnold, female   DOB: 1945-10-15, 69 y.o.   MRN: [id number]     Primary Care Physician: Kathryn Spencer, FNP Referring Physician:   CORABELLE Arnold is a 69 y.o. female with a h/o afib s/p PVI and aflutter abaltion 11/03/14 for f/u in the afib clinic. She is doing well maintaining SR. She is off flecainide.Coninues on xarelto without bleeding issues. No issues with rt groin or swallowing issues s/p procedure. She is very happy with the result. She exercises on a regular basis and is normal weight, no tobacco/alcohol/ caffeine use.  Today, she denies symptoms of palpitations, chest pain, shortness of breath, orthopnea, PND, lower extremity edema, dizziness, presyncope, syncope, or neurologic sequela. The patient is tolerating medications without difficulties and is otherwise without complaint today.   Past Medical History  Diagnosis Date  . Abnormal Pap smear   . 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 Complication of anesthesia     alittle slow to wake up-stayed drowsy  . Hypercholesteremia   . Osteoporosis, unspecified     Last DEXA 10/2011   . Hyperlipidemia with target LDL less than 100   . Vitamin D deficiency   . HSV-2 (herpes simplex virus 2) infection   . Paroxysmal atrial fibrillation    Past Surgical History  Procedure Laterality Date  . Breast lumpectomy    . Dilation and curettage of uterus    . Laparoscopy abdomen diagnostic    . Electrophysiologic study N/A 11/03/2014    Procedure: Atrial Fibrillation Ablation;  Surgeon: Kathryn Range, MD;  Location: [facility] INVASIVE CV LAB;  Service: Cardiovascular;  Laterality: N/A;    Current Outpatient Prescriptions  Medication Sig Dispense Refill  . alendronate (FOSAMAX) 70 MG tablet Take 1 tablet (70 mg total) by mouth every 7 (seven) days. Take with a full glass of water on an empty stomach. 12 tablet 3  . Calcium Carbonate (CALCIUM 600 PO) Take 1 tablet by mouth 2 (two) times daily.    . Coenzyme Q10 (CO Q  10) 100 MG CAPS Take 1 capsule by mouth daily.    [name] diltiazem (CARDIZEM LA) 120 MG 24 hr tablet Take 1 tablet (120 mg total) by mouth daily. 30 tablet 0  . Magnesium 250 MG TABS Take 2 tablets by mouth daily.    . Multiple Vitamins-Minerals (CENTRUM SILVER ULTRA WOMENS PO) Take 1 capsule by mouth daily.     . pantoprazole (PROTONIX) 40 MG tablet Take 1 tablet (40 mg total) by mouth daily. 90 tablet 1  . rosuvastatin (CRESTOR) 20 MG tablet Take 1 tablet by mouth  daily 90 tablet 2  . valACYclovir (VALTREX) 500 MG tablet Take 1 tablet by mouth  daily 90 tablet 1  . vitamin C (ASCORBIC ACID) 500 MG tablet Take 500 mg by mouth 2 (two) times daily.     Kathryn Arnold 20 MG TABS tablet Take 1 tablet (20 mg total) by mouth daily with supper. 90 tablet 0  . diltiazem (CARDIZEM) 60 MG tablet Take 1 tablet by mouth daily as needed  for fast heart rate/high BP     No current facility-administered medications for this encounter.    No Known Allergies  History   Social History  . Marital Status: Married    Spouse Name: N/A  . Number of Children: N/A  . Years of Education: N/A   Occupational History  . Not on file.   Social History Main Topics  . Smoking status: Never Smoker   .  Smokeless tobacco: Not on file  . Alcohol Use: No  . Drug Use: No  . Sexual Activity: Yes    Birth Control/ Protection: None   Other Topics Concern  . Not on file   Social History Narrative   Lives in [place] with spouse.    Family History  Problem Relation Age of Onset  . Other Neg Hx   . Osteoporosis Mother   . Cancer Mother     breast  . Hypertension Mother   . Heart disease Father     ROS- All systems are reviewed and negative except as per the HPI above  Physical Exam: Filed Vitals:   12/01/14 1142  BP: 130/76  Pulse: 61  Height:  (1.651 m)  Weight: 192 lb 6.4 oz (87.272 kg)    GEN- The patient is well appearing, alert and oriented x 3 today.   Head- normocephalic, atraumatic Eyes-   Sclera clear, conjunctiva pink Ears- hearing intact Oropharynx- clear Neck- supple, no JVP Lymph- no cervical lymphadenopathy Lungs- Clear to ausculation bilaterally, normal work of breathing Heart- Regular rate and rhythm, no murmurs, rubs or gallops, PMI not laterally displaced GI- soft, NT, ND, + BS Extremities- no clubbing, cyanosis, or edema MS- no significant deformity or atrophy Skin- no rash or lesion Psych- euthymic mood, full affect Neuro- strength and sensation are intact  EKG-SR with IRBB at 61 bpm. Epic records reviewed    Assessment and Plan: 1.Afib S/p ablation in SR Off flecainide continue xarelto continue healthy lifestyle  F/u with Dr. Johney Frame in October as scheduled Afib clinic as needed  Kathryn Arnold. Kathryn Arnold [facility] [address] [place], [place] [address] [phone]

## 2015-01-04 ENCOUNTER — Other Ambulatory Visit: Payer: Self-pay | Admitting: *Deleted

## 2015-01-04 MED ORDER — RIVAROXABAN 20 MG PO TABS: ORAL_TABLET | ORAL | Status: DC

## 2015-01-08 ENCOUNTER — Telehealth: Payer: Self-pay

## 2015-01-08 NOTE — Telephone Encounter (Signed)
Prior auth for Xarelto  faxed to [facility]

## 2015-01-08 NOTE — Telephone Encounter (Signed)
Duplicate encounter opened. 

## 2015-01-14 ENCOUNTER — Telehealth: Payer: Self-pay

## 2015-01-14 NOTE — Telephone Encounter (Signed)
Prior auth obtained for Xarelto , good for 1 year.

## 2015-01-16 ENCOUNTER — Emergency Department (HOSPITAL_COMMUNITY)
Admission: EM | Admit: 2015-01-16 | Discharge: 2015-01-16 | Disposition: A | Discharge status: Home / Self Care | Payer: 59 | Attending: Emergency Medicine | Admitting: Emergency Medicine

## 2015-01-16 ENCOUNTER — Encounter (HOSPITAL_COMMUNITY): Payer: Self-pay | Admitting: Emergency Medicine

## 2015-01-16 DIAGNOSIS — Z7901 Long term (current) use of anticoagulants: Secondary | ICD-10-CM | POA: Diagnosis not present

## 2015-01-16 DIAGNOSIS — I4891 Unspecified atrial fibrillation: Secondary | ICD-10-CM | POA: Diagnosis present

## 2015-01-16 DIAGNOSIS — E785 Hyperlipidemia, unspecified: Secondary | ICD-10-CM | POA: Diagnosis not present

## 2015-01-16 DIAGNOSIS — Z79899 Other long term (current) drug therapy: Secondary | ICD-10-CM | POA: Insufficient documentation

## 2015-01-16 DIAGNOSIS — M810 Age-related osteoporosis without current pathological fracture: Secondary | ICD-10-CM | POA: Insufficient documentation

## 2015-01-16 DIAGNOSIS — E780 Pure hypercholesterolemia: Secondary | ICD-10-CM | POA: Diagnosis not present

## 2015-01-16 DIAGNOSIS — Z8619 Personal history of other infectious and parasitic diseases: Secondary | ICD-10-CM | POA: Diagnosis not present

## 2015-01-16 DIAGNOSIS — R42 Dizziness and giddiness: Secondary | ICD-10-CM | POA: Diagnosis not present

## 2015-01-16 DIAGNOSIS — I480 Paroxysmal atrial fibrillation: Secondary | ICD-10-CM | POA: Insufficient documentation

## 2015-01-16 DIAGNOSIS — Z9889 Other specified postprocedural states: Secondary | ICD-10-CM | POA: Diagnosis not present

## 2015-01-16 NOTE — ED Provider Notes (Signed)
CSN: [id number]     Arrival date & time 01/16/15  1645 History   First MD Initiated Contact with Patient 01/16/15 1701     Chief Complaint  Patient presents with  . Atrial Fibrillation     (Consider location/radiation/quality/duration/timing/severity/associated sxs/prior Treatment) HPI   Kathryn Arnold is a 69 y.o. female who presents for an episode of rapid heartbeat which she attributes to "atrial fibrillation". She states that she was at home doing her usual activities when she had sudden sensation of rapid heartbeat. This was associated with mild dizziness. Both of these symptoms resolved after 45 minutes without intervention. She is status post ablation several months ago, and is doing well off antiarrhythmic medication. She has been taking her usual rate control medicine. She denies recent fever, chills, nausea, vomiting, cough, shortness of breath or chest pain. There are no other known modifying factors.   Past Medical History  Diagnosis Date  . Abnormal Pap smear   . 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 Complication of anesthesia     alittle slow to wake up-stayed drowsy  . Hypercholesteremia   . Osteoporosis, unspecified     Last DEXA 10/2011   . Hyperlipidemia with target LDL less than 100   . Vitamin D deficiency   . HSV-2 (herpes simplex virus 2) infection   . Paroxysmal atrial fibrillation    Past Surgical History  Procedure Laterality Date  . Breast lumpectomy    . Dilation and curettage of uterus    . Laparoscopy abdomen diagnostic    . Electrophysiologic study N/A 11/03/2014    Procedure: Atrial Fibrillation Ablation;  Surgeon: Hillis Range, MD;  Location: [facility] INVASIVE CV LAB;  Service: Cardiovascular;  Laterality: N/A;   Family History  Problem Relation Age of Onset  . Other Neg Hx   . Osteoporosis Mother   . Cancer Mother     breast  . Hypertension Mother   . Heart disease Father    Social History  Substance Use Topics  . Smoking status: Never  Smoker   . Smokeless tobacco: None  . Alcohol Use: No   OB History    Gravida Para Term Preterm AB TAB SAB Ectopic Multiple Living   0 0 0 0 0 0 2     Review of Systems  All other systems reviewed and are negative.     Allergies  Review of patient's allergies indicates no known allergies.  Home Medications   Prior to Admission medications   Medication Sig Start Date End Date Taking? Authorizing Provider  alendronate (FOSAMAX) 70 MG tablet Take 1 tablet (70 mg total) by mouth every 7 (seven) days. Take with a full glass of water on an empty stomach. 08/28/12   Mary-Margaret Daphine Deutscher, FNP  Calcium Carbonate (CALCIUM 600 PO) Take 1 tablet by mouth 2 (two) times daily.    Historical Provider, MD  Coenzyme Q10 (CO Q 10) 100 MG CAPS Take 1 capsule by mouth daily.    Historical Provider, MD  diltiazem (CARDIZEM LA) 120 MG 24 hr tablet Take 1 tablet (120 mg total) by mouth daily. 02/23/14   Mary-Margaret Daphine Deutscher, FNP  diltiazem (CARDIZEM) 60 MG tablet Take 1 tablet by mouth daily as needed  for fast heart rate/high BP 06/29/14   Hillis Range, MD  Magnesium 250 MG TABS Take 2 tablets by mouth daily.    Historical Provider, MD  Multiple Vitamins-Minerals (CENTRUM SILVER ULTRA WOMENS PO) Take 1 capsule by mouth daily.  Historical Provider, MD  pantoprazole (PROTONIX) 40 MG tablet Take 1 tablet (40 mg total) by mouth daily. 11/04/14   Hillis Range, MD  rivaroxaban (XARELTO) 20 MG TABS tablet Take 1 tablet (20 mg total) by mouth daily with supper. 01/04/15   Hillis Range, MD  rosuvastatin (CRESTOR) 20 MG tablet Take 1 tablet by mouth  daily 10/29/14   Junie Spencer, FNP  valACYclovir (VALTREX) 500 MG tablet Take 1 tablet by mouth  daily 10/29/14   Junie Spencer, FNP  vitamin C (ASCORBIC ACID) 500 MG tablet Take 500 mg by mouth 2 (two) times daily.     Historical Provider, MD   BP 141/79 mmHg  Pulse 75  Temp(Src) 98.3 F (36.8 C) (Oral)  Resp 16  SpO2 96% Physical Exam  Constitutional: She  is oriented to person, place, and time. She appears well-developed and well-nourished.  HENT:  Head: Normocephalic and atraumatic.  Right Ear: External ear normal.  Left Ear: External ear normal.  Eyes: Conjunctivae and EOM are normal. Pupils are equal, round, and reactive to light.  Neck: Normal range of motion and phonation normal. Neck supple.  Cardiovascular: Normal rate, regular rhythm and normal heart sounds.   Pulmonary/Chest: Effort normal and breath sounds normal. She exhibits no bony tenderness.  Abdominal: Soft. There is no tenderness.  Musculoskeletal: Normal range of motion.  Neurological: She is alert and oriented to person, place, and time. No cranial nerve deficit or sensory deficit. She exhibits normal muscle tone. Coordination normal.  Skin: Skin is warm, dry and intact.  Psychiatric: She has a normal mood and affect. Her behavior is normal. Judgment and thought content normal.  Nursing note and vitals reviewed.   ED Course  Procedures (including critical care time)  Medications - No data to display  Patient Vitals for the past 24 hrs:  BP Temp Temp src Pulse Resp SpO2  01/16/15 1705 141/79 mmHg 98.3 F (36.8 C) Oral 75 16 96 %   Consult cardiology- 18:14- they have not returned the call. As of 18:55 and the patient wants to leave.  6:57 PM Reevaluation with update and discussion. After initial assessment and treatment, an updated evaluation reveals patient remains comfortable, without palpitations and is ready to go home. Findings discussed with the patient, all questions were answered.Mancel Bale L    Labs Review Labs Reviewed - No data to display  Imaging Review No results found. I have personally reviewed and evaluated these images and lab results as part of my medical decision-making.   EKG Interpretation   Date/Time:  Saturday January 16 2015 16:53:33 EDT Ventricular Rate:  79 PR Interval:  126 QRS Duration: 106 QT Interval:  384 QTC  Calculation: 440 R Axis:   2 Text Interpretation:  atrial rhythym Incomplete right bundle branch block  Nonspecific ST abnormality Abnormal ECG since last tracing no significant  change Confirmed by Effie Shy  MD, ELLIOTT ([id number]) on 01/16/2015 5:05:09 PM      MDM   Final diagnoses:  Paroxysmal atrial fibrillation    Paroxysmal atrial fibrillation, by history. Patient stable in the ED without treatment. No clinical evidence for inciting factors. Therefore, she is stable for discharge  Nursing Notes Reviewed/ Care Coordinated Applicable Imaging Reviewed Interpretation of Laboratory Data incorporated into ED treatment  The patient appears reasonably screened and/or stabilized for discharge and I doubt any other medical condition or other [facility] requiring further screening, evaluation, or treatment in the ED at this time prior to discharge.  Plan: Home  Medications- usual; Home Treatments- rest; return here if the recommended treatment, does not improve the symptoms; Recommended follow up- PCP and Cardiology prn    Mancel Bale, MD 01/16/15 1858

## 2015-01-16 NOTE — ED Notes (Signed)
Pt to ED via [facility] with c/o rapid heart rate.  Pt st's she has previously had an ablation for A-fib but today her heart starting beating fast.  EMS st's on there arrival pt was in rapid A-fib.  Enroute to ED pt converted.  No complaints at this time

## 2015-01-16 NOTE — ED Notes (Signed)
PT remains in NSR at this time with no complaints

## 2015-01-16 NOTE — Discharge Instructions (Signed)

## 2015-01-18 ENCOUNTER — Ambulatory Visit (HOSPITAL_COMMUNITY)
Admission: RE | Admit: 2015-01-18 | Discharge: 2015-01-18 | Disposition: A | Discharge status: Home / Self Care | Payer: 59 | Source: Ambulatory Visit | Attending: Nurse Practitioner | Admitting: Nurse Practitioner

## 2015-01-18 ENCOUNTER — Encounter (HOSPITAL_COMMUNITY): Payer: Self-pay | Admitting: Nurse Practitioner

## 2015-01-18 VITALS — BP 128/82 | HR 76 | Ht 65.0 in | Wt 142.4 lb

## 2015-01-18 DIAGNOSIS — Z79899 Other long term (current) drug therapy: Secondary | ICD-10-CM | POA: Insufficient documentation

## 2015-01-18 DIAGNOSIS — Z7983 Long term (current) use of bisphosphonates: Secondary | ICD-10-CM | POA: Insufficient documentation

## 2015-01-18 DIAGNOSIS — E559 Vitamin D deficiency, unspecified: Secondary | ICD-10-CM | POA: Insufficient documentation

## 2015-01-18 DIAGNOSIS — M810 Age-related osteoporosis without current pathological fracture: Secondary | ICD-10-CM | POA: Insufficient documentation

## 2015-01-18 DIAGNOSIS — Z8249 Family history of ischemic heart disease and other diseases of the circulatory system: Secondary | ICD-10-CM | POA: Insufficient documentation

## 2015-01-18 DIAGNOSIS — E780 Pure hypercholesterolemia: Secondary | ICD-10-CM | POA: Diagnosis not present

## 2015-01-18 DIAGNOSIS — I4891 Unspecified atrial fibrillation: Secondary | ICD-10-CM | POA: Diagnosis not present

## 2015-01-18 DIAGNOSIS — I480 Paroxysmal atrial fibrillation: Secondary | ICD-10-CM | POA: Diagnosis not present

## 2015-01-18 DIAGNOSIS — Z7902 Long term (current) use of antithrombotics/antiplatelets: Secondary | ICD-10-CM | POA: Insufficient documentation

## 2015-01-18 DIAGNOSIS — E785 Hyperlipidemia, unspecified: Secondary | ICD-10-CM | POA: Insufficient documentation

## 2015-01-18 MED ORDER — DILTIAZEM HCL 60 MG PO TABS: ORAL_TABLET | ORAL | Status: DC

## 2015-01-18 NOTE — Progress Notes (Addendum)
Patient ID: Kathryn Arnold, female   DOB: 1945-09-01, 69 y.o.   MRN: [id number]     Primary Care Physician: Junie Spencer, FNP Referring Physician: [facility] f/u   LAURALYE Arnold is a 69 y.o. female with a h/o afib s/p PVI and aflutter abaltion 11/03/14 for f/u in the afib clinic from an ER visit this weekend.. She had been doing well maintaining SR.  She is off flecainide.Continues on xarelto without bleeding issues.  She exercises on a regular basis and is normal weight, no tobacco/alcohol/ caffeine use.  On Saturday, she had done some house cleaning, ran some errands and had just sat down to relax. She went into afib and her heart rate was around 130 bpm. She states her whole body shakes with afib and her 79 yo granddaughter was there, got scared and called EMS. On the way to the ER, she converted. No further issues.  Today, she denies symptoms of palpitations, chest pain, shortness of breath, orthopnea, PND, lower extremity edema, dizziness, presyncope, syncope, or neurologic sequela. The patient is tolerating medications without difficulties and is otherwise without complaint today.   Past Medical History  Diagnosis Date  . Abnormal Pap smear   . 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 Complication of anesthesia     alittle slow to wake up-stayed drowsy  . Hypercholesteremia   . Osteoporosis, unspecified     Last DEXA 10/2011   . Hyperlipidemia with target LDL less than 100   . Vitamin D deficiency   . HSV-2 (herpes simplex virus 2) infection   . Paroxysmal atrial fibrillation    Past Surgical History  Procedure Laterality Date  . Breast lumpectomy    . Dilation and curettage of uterus    . Laparoscopy abdomen diagnostic    . Electrophysiologic study N/A 11/03/2014    Procedure: Atrial Fibrillation Ablation;  Surgeon: Hillis Range, MD;  Location: [facility] INVASIVE CV LAB;  Service: Cardiovascular;  Laterality: N/A;    Current Outpatient Prescriptions  Medication Sig Dispense  Refill  . alendronate (FOSAMAX) 70 MG tablet Take 1 tablet (70 mg total) by mouth every 7 (seven) days. Take with a full glass of water on an empty stomach. 12 tablet 3  . Calcium Carbonate (CALCIUM 600 PO) Take 1 tablet by mouth 2 (two) times daily.    . Coenzyme Q10 (CO Q 10) 100 MG CAPS Take 1 capsule by mouth daily.    [name] diltiazem (CARDIZEM LA) 120 MG 24 hr tablet Take 1 tablet (120 mg total) by mouth daily. 30 tablet 0  . Magnesium 250 MG TABS Take 2 tablets by mouth daily.    . Multiple Vitamins-Minerals (CENTRUM SILVER ULTRA WOMENS PO) Take 1 capsule by mouth daily.     . pantoprazole (PROTONIX) 40 MG tablet Take 1 tablet (40 mg total) by mouth daily. 90 tablet 1  . rivaroxaban (XARELTO) 20 MG TABS tablet Take 1 tablet (20 mg total) by mouth daily with supper. 90 tablet 0  . rosuvastatin (CRESTOR) 20 MG tablet Take 1 tablet by mouth  daily 90 tablet 2  . valACYclovir (VALTREX) 500 MG tablet Take 1 tablet by mouth  daily 90 tablet 1  . vitamin C (ASCORBIC ACID) 500 MG tablet Take 500 mg by mouth 2 (two) times daily.     [name] diltiazem (CARDIZEM) 60 MG tablet Take 1/2 to 1 tablet every 4 hours AS NEEDED for heart rate >100 as long as blood pressure >100  No current facility-administered medications for this encounter.    No Known Allergies  Social History   Social History  . Marital Status: Married    Spouse Name: N/A  . Number of Children: N/A  . Years of Education: N/A   Occupational History  . Not on file.   Social History Main Topics  . Smoking status: Never Smoker   . Smokeless tobacco: Not on file  . Alcohol Use: No  . Drug Use: No  . Sexual Activity: Yes    Birth Control/ Protection: None   Other Topics Concern  . Not on file   Social History Narrative   Lives in [place] with spouse.    Family History  Problem Relation Age of Onset  . Other Neg Hx   . Osteoporosis Mother   . Cancer Mother     breast  . Hypertension Mother   . Heart disease Father      ROS- All systems are reviewed and negative except as per the HPI above  Physical Exam: Filed Vitals:   01/18/15 1507  BP: 128/82  Pulse: 76  Height:  (1.651 m)  Weight: 142 lb 6.4 oz (64.592 kg)    GEN- The patient is well appearing, alert and oriented x 3 today.   Head- normocephalic, atraumatic Eyes-  Sclera clear, conjunctiva pink Ears- hearing intact Oropharynx- clear Neck- supple, no JVP Lymph- no cervical lymphadenopathy Lungs- Clear to ausculation bilaterally, normal work of breathing Heart- Regular rate and rhythm, no murmurs, rubs or gallops, PMI not laterally displaced GI- soft, NT, ND, + BS Extremities- no clubbing, cyanosis, or edema MS- no significant deformity or atrophy Skin- no rash or lesion Psych- euthymic mood, full affect Neuro- strength and sensation are intact  EKG-SR with IRBB at 72 bpm, Pr int 118 ms, QRS 98 ms, QTc 431 ms. [name] Epic records reviewed, recent ER visit.    Assessment and Plan: 1.Afib In SR today continue xarelto continue healthy lifestyle We discussed how to use short acting Cardizem/flecainide if afib should return Try to avoid ER and give drugs a chance to work if she feels like she is stable  F/u with Dr. Johney Frame in October as scheduled Afib clinic as needed  Lupita Leash C. [facility] Fol[facility] [place] [address] [place], [place] [address] [phone]

## 2015-01-18 NOTE — Addendum Note (Signed)
Encounter addended by: Newman Nip, NP on: 01/18/2015  5:07 PM<BR>     Documentation filed: Notes Section

## 2015-01-18 NOTE — Patient Instructions (Signed)
Your physician has recommended you make the following change in your medication:  1)Cardizem  -- take 1/2 tablet to 1 tablet every 4 hours AS NEEDED for heart rate >100 as long as blood pressure >100

## 2015-01-22 ENCOUNTER — Ambulatory Visit (INDEPENDENT_AMBULATORY_CARE_PROVIDER_SITE_OTHER): Payer: 59

## 2015-01-22 DIAGNOSIS — Z23 Encounter for immunization: Secondary | ICD-10-CM | POA: Diagnosis not present

## 2015-02-02 ENCOUNTER — Other Ambulatory Visit: Payer: Self-pay

## 2015-02-08 ENCOUNTER — Telehealth: Payer: Self-pay | Admitting: Family

## 2015-02-08 ENCOUNTER — Encounter: Payer: Self-pay | Admitting: Internal Medicine

## 2015-02-08 ENCOUNTER — Ambulatory Visit (INDEPENDENT_AMBULATORY_CARE_PROVIDER_SITE_OTHER): Payer: 59 | Admitting: Internal Medicine

## 2015-02-08 VITALS — BP 142/88 | HR 61 | Ht 65.0 in | Wt 144.1 lb

## 2015-02-08 DIAGNOSIS — I480 Paroxysmal atrial fibrillation: Secondary | ICD-10-CM

## 2015-02-08 MED ORDER — RIVAROXABAN 20 MG PO TABS: ORAL_TABLET | ORAL | Status: DC

## 2015-02-08 MED ORDER — DILTIAZEM HCL ER COATED BEADS 120 MG PO TB24: 120.0000 mg | ORAL_TABLET | Freq: Every day | ORAL | Status: DC

## 2015-02-08 NOTE — Progress Notes (Signed)
Electrophysiology Office Note   Date:  02/08/2015   ID:  Kathryn Arnold, DOB 03/23/1946, MRN [id number]  PCP:  [name], FNP   Primary Electrophysiologist: Sheilah Pigeon, PA-C    Chief Complaint  Patient presents with  . PAF     History of Present Illness: Kathryn Arnold is a 69 y.o. female who afib s/p PVI and right aflutter abaltion 11/03/14 she had an ER visit 01/16/15 with palpitations, she reports her granddaughter became worried and called 911, who told her she was in AFib, though she states she knew she was.  By the time she arrived to the ED she was back in SR and eventually discharged.  She has not had another episode since then.  She was seen in the AF clinic and advised to take a Cardizem when she had palpitations that were prolonged but has not had to use it.  Today, she denies symptoms of palpitations, chest pain, shortness of breath, orthopnea, PND, lower extremity edema, claudication, dizziness, presyncope, syncope, bleeding, or neurologic sequela. The patient is tolerating medications without difficulties and is otherwise without complaint today.    Past Medical History  Diagnosis Date  . Abnormal Pap smear   . 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 Complication of anesthesia     alittle slow to wake up-stayed drowsy  . Hypercholesteremia   . Osteoporosis, unspecified     Last DEXA 10/2011   . Hyperlipidemia with target LDL less than 100   . Vitamin D deficiency   . HSV-2 (herpes simplex virus 2) infection   . Paroxysmal atrial fibrillation (HCC)     s/p ablation Afib and flutter 11/03/14   Past Surgical History  Procedure Laterality Date  . Breast lumpectomy    . Dilation and curettage of uterus    . Laparoscopy abdomen diagnostic    . Electrophysiologic study N/A 11/03/2014    Procedure: Atrial Fibrillation Ablation;  Surgeon: Hillis Range, MD;  Location: [facility] INVASIVE CV LAB;  Service: Cardiovascular;  Laterality: N/A;     Current  Outpatient Prescriptions  Medication Sig Dispense Refill  . alendronate (FOSAMAX) 70 MG tablet Take 1 tablet (70 mg total) by mouth every 7 (seven) days. Take with a full glass of water on an empty stomach. 12 tablet 3  . Calcium Carbonate (CALCIUM 600 PO) Take 1 tablet by mouth 2 (two) times daily.    . Coenzyme Q10 (CO Q 10) 100 MG CAPS Take 1 capsule by mouth daily.    [name] diltiazem (CARDIZEM LA) 120 MG 24 hr tablet Take 1 tablet (120 mg total) by mouth daily. 90 tablet 0  . diltiazem (CARDIZEM) 60 MG tablet Take 1/2 to 1 tablet every 4 hours AS NEEDED for heart rate >100 as long as blood pressure >100    . Magnesium 250 MG TABS Take 2 tablets by mouth daily.    . Multiple Vitamins-Minerals (CENTRUM SILVER ULTRA WOMENS PO) Take 1 capsule by mouth daily.     . rivaroxaban (XARELTO) 20 MG TABS tablet Take 1 tablet (20 mg total) by mouth daily with supper. 90 tablet 0  . rosuvastatin (CRESTOR) 20 MG tablet Take 1 tablet by mouth  daily 90 tablet 2  . valACYclovir (VALTREX) 500 MG tablet Take 1 tablet by mouth  daily 90 tablet 1  . vitamin C (ASCORBIC ACID) 500 MG tablet Take 500 mg by mouth 2 (two) times daily.      No current facility-administered medications for this  visit.    Allergies:   Review of patient's allergies indicates no known allergies.   Social History:  The patient  reports that she has never smoked. She does not have any smokeless tobacco history on file. She reports that she does not drink alcohol or use illicit drugs.   Family History:  The patient's  family history includes Cancer in her mother; Heart disease in her father; Hypertension in her mother; Osteoporosis in her mother. There is no history of Other.    ROS:  Please see the history of present illness.   All other systems are reviewed and negative.    PHYSICAL EXAM: VS:  BP 142/88 mmHg  Pulse 61  Ht 5\' 5"  (1.651 m)  Wt 144 lb 1.9 oz (65.372 kg)  BMI 23.98 kg/m2 , BMI Body mass index is 23.98 kg/(m^2). GEN:  Well nourished, well developed, in no acute distress HEENT: normal Neck: no JVD, carotid bruits, or masses Cardiac: RRR; no murmurs, rubs, or gallops,no edema  Respiratory:  clear to auscultation bilaterally, normal work of breathing GI: soft, nontender, nondistended, + BS MS: no deformity or atrophy Skin: warm and dry  Neuro:  Strength and sensation are intact Psych: euthymic mood, full affect  EKG:  EKG is ordered today. The ekg ordered today shows sinus rhythm 61 bpm, PR 180, incomplete RBBB   Recent Labs: 10/27/2014: Hemoglobin 13.6; Platelets 208.0 10/29/2014: ALT 16; BUN 14; Creatinine, Ser 0.80; Potassium 4.3; Sodium 141    Lipid Panel     Component Value Date/Time   CHOL 163 10/29/2014 0911   CHOL 168 12/18/2013 1103   CHOL 136 08/22/2012 0847   TRIG 128 10/29/2014 0911   TRIG 195* 12/18/2013 1103   TRIG 109 08/22/2012 0847   HDL 50 10/29/2014 0911   HDL 47 12/18/2013 1103   HDL 47 08/22/2012 0847   CHOLHDL 3.3 10/29/2014 0911   LDLCALC 87 10/29/2014 0911   LDLCALC 82 12/18/2013 1103   LDLCALC 67 08/22/2012 0847     Wt Readings from Last 3 Encounters:  02/08/15 144 lb 1.9 oz (65.372 kg)  01/18/15 142 lb 6.4 oz (64.592 kg)  12/01/14 192 lb 6.4 oz (87.272 kg)      Other studies Reviewed: 11/03/14 EPS and ablation PREPROCEDURE DIAGNOSES: 1. Paroxysmal atrial fibrillation.POSTPROCEDURE DIAGNOSES: 1. Paroxysmal atrial fibrillation. 2. Right atrial flutter PROCEDURES: 1. Comprehensive electrophysiologic study. 2. Coronary sinus pacing and recording. 3. Three-dimensional mapping of atrial fibrillation with additional mapping and ablation of a second discrete focus (atrial flutter) 4. Ablation of atrial fibrillation with additional mapping and ablation of a second discrete focus (atrial flutter) 5. Intracardiac echocardiography. 6. Transseptal puncture of an intact septum. 7. Arrhythmia induction with pacing with isuprel infusion Additional studies/ records  that were reviewed today include: AF clinic note, ED EKG's and prior echo    ASSESSMENT AND PLAN:  1.  Paroxysmal atrial fibrillation Doing well s/p  PVI and R aflutter ablation 11/03/14.  She has had some ERAF. chads2vasc score is at least 2.  Continue anticoagulation. She can stop pantoprazole Continue PRN Cardizem as directed by Lupita Leash at the AFib clinic, if she has persistant AF despite this she can take a Flecainide and is instructed on this   Current medicines are reviewed at length with the patient today.   The patient does not have concerns regarding her medicines.  The following changes were made today:  none  Labs/ tests ordered today include:  No orders of the defined types were placed  in this encounter.    F/U with Lupita Leash in the  AFib clinic every 3 months.  I will see when needed  Randolm Idol MD 02/08/2015 11:12 AM     [facility] [address] [place] [address] [phone] (office) [phone] (fax)

## 2015-02-08 NOTE — Telephone Encounter (Signed)
Done

## 2015-02-08 NOTE — Patient Instructions (Addendum)
Medication Instructions:  Your physician has recommended you make the following change in your medication:  1) Stop Protonix   Labwork: None ordered   Testing/Procedures: None ordered   Follow-Up: Your physician recommends that you schedule a follow-up appointment in: 3 months with Rudi Cocoonna Carroll, NP   Any Other Special Instructions Will Be Listed Below (If Applicable).

## 2015-02-23 LAB — HM MAMMOGRAPHY: HM MAMMO: NEGATIVE

## 2015-02-23 LAB — HM PAP SMEAR: HM PAP: NEGATIVE

## 2015-02-23 LAB — HM DEXA SCAN

## 2015-03-19 ENCOUNTER — Encounter: Payer: Self-pay | Admitting: Pediatrics

## 2015-03-19 ENCOUNTER — Ambulatory Visit (INDEPENDENT_AMBULATORY_CARE_PROVIDER_SITE_OTHER): Payer: 59 | Admitting: Pediatrics

## 2015-03-19 VITALS — BP 132/81 | HR 67 | Temp 97.4°F | Ht 65.0 in | Wt 148.0 lb

## 2015-03-19 DIAGNOSIS — R399 Unspecified symptoms and signs involving the genitourinary system: Secondary | ICD-10-CM

## 2015-03-19 DIAGNOSIS — N390 Urinary tract infection, site not specified: Secondary | ICD-10-CM

## 2015-03-19 DIAGNOSIS — R8281 Pyuria: Secondary | ICD-10-CM

## 2015-03-19 DIAGNOSIS — R3915 Urgency of urination: Secondary | ICD-10-CM | POA: Diagnosis not present

## 2015-03-19 LAB — POCT UA - MICROSCOPIC ONLY
CRYSTALS, UR, HPF, POC: NEGATIVE
Casts, Ur, LPF, POC: NEGATIVE
MUCUS UA: NEGATIVE
YEAST UA: NEGATIVE

## 2015-03-19 LAB — POCT URINALYSIS DIPSTICK
BILIRUBIN UA: NEGATIVE
Glucose, UA: NEGATIVE
KETONES UA: NEGATIVE
Nitrite, UA: NEGATIVE
PH UA: 7
SPEC GRAV UA: 1.01
Urobilinogen, UA: NEGATIVE

## 2015-03-19 MED ORDER — NITROFURANTOIN MONOHYD MACRO 100 MG PO CAPS: 100.0000 mg | ORAL_CAPSULE | Freq: Two times a day (BID) | ORAL | Status: DC

## 2015-03-19 NOTE — Progress Notes (Signed)
Subjective:    Patient ID: Kathryn Arnold, female    DOB: June 03, 1945, 69 y.o.   MRN: [id number]  CC: UTI sx  HPI: Kathryn Arnold is a 69 y.o. female presenting on 03/19/2015 for Urinary Urgency and Burning Urination  Yesterday started having dysuria Today dysuria is worse Urinary frequency and urgency today One prior lifetime UTI No fevers Also has a sore tooth Some abdominal pain Normal appetite  Relevant past medical, surgical, family and social history reviewed and updated as indicated. Interim medical history since our last visit reviewed. Allergies and medications reviewed and updated.   ROS: Per HPI unless specifically indicated above  Past Medical History Patient Active Problem List   Diagnosis Date Noted  . A-fib (HCC) 11/03/2014  . GAD (generalized anxiety disorder) 01/28/2014  . Essential hypertension, benign 01/20/2014  . Atrial fibrillation (HCC) 12/31/2013  . HSV-2 (herpes simplex virus 2) infection 12/18/2013  . Vitamin D deficiency 12/18/2013  . Hyperlipidemia with target LDL less than 100 08/28/2012  . Osteoporosis 08/28/2012    Current Outpatient Prescriptions  Medication Sig Dispense Refill  . alendronate (FOSAMAX) 70 MG tablet Take 1 tablet (70 mg total) by mouth every 7 (seven) days. Take with a full glass of water on an empty stomach. 12 tablet 3  . Calcium Carbonate (CALCIUM 600 PO) Take 1 tablet by mouth 2 (two) times daily.    . Coenzyme Q10 (CO Q 10) 100 MG CAPS Take 1 capsule by mouth daily.    [name] diltiazem (CARDIZEM LA) 120 MG 24 hr tablet Take 1 tablet (120 mg total) by mouth daily. 90 tablet 0  . diltiazem (CARDIZEM) 60 MG tablet Take 1/2 to 1 tablet every 4 hours AS NEEDED for heart rate >100 as long as blood pressure >100    . Magnesium 250 MG TABS Take 2 tablets by mouth daily.    . Multiple Vitamins-Minerals (CENTRUM SILVER ULTRA WOMENS PO) Take 1 capsule by mouth daily.     . rivaroxaban (XARELTO) 20 MG TABS tablet Take 1 tablet (20  mg total) by mouth daily with supper. 90 tablet 3  . rosuvastatin (CRESTOR) 20 MG tablet Take 1 tablet by mouth  daily 90 tablet 2  . valACYclovir (VALTREX) 500 MG tablet Take 1 tablet by mouth  daily 90 tablet 1  . vitamin C (ASCORBIC ACID) 500 MG tablet Take 500 mg by mouth 2 (two) times daily.     . nitrofurantoin, macrocrystal-monohydrate, (MACROBID) 100 MG capsule Take 1 capsule (100 mg total) by mouth 2 (two) times daily. 1 po BId 14 capsule 0   No current facility-administered medications for this visit.       Objective:    BP 132/81 mmHg  Pulse 67  Temp(Src) 97.4 F (36.3 C) (Oral)  Ht 5\' 5"  (1.651 m)  Wt 148 lb (67.132 kg)  BMI 24.63 kg/m2  Wt Readings from Last 3 Encounters:  03/19/15 148 lb (67.132 kg)  02/08/15 144 lb 1.9 oz (65.372 kg)  01/18/15 142 lb 6.4 oz (64.592 kg)     Gen: NAD, alert, cooperative with exam, NCAT EYES: EOMI, no scleral injection or icterus ENT:  TMs pearly gray b/l, OP without erythema LYMPH: no cervical LAD CV: NRRR, normal S1/S2, no murmur, distal pulses 2+ b/l Resp: CTABL, no wheezes, normal WOB Abd: +BS, soft, suprapubic tenderness, ND. no guarding or organomegaly, no CVA tenderness Ext: No edema, warm Neuro: Alert and oriented, strength equal b/l UE and LE, coordination grossly normal  MSK: normal muscle bulk     Assessment & Plan:   Kathryn Arnold was seen today for urinary urgency and burning urination.  Diagnoses and all orders for this visit:  Pyuria -     nitrofurantoin, macrocrystal-monohydrate, (MACROBID) 100 MG capsule; Take 1 capsule (100 mg total) by mouth 2 (two) times daily. 1 po BId  Urgency of urination -     POCT UA - Microscopic Only -     POCT urinalysis dipstick  UTI symptoms -     POCT UA - Microscopic Only -     POCT urinalysis dipstick   Follow up plan: prn  Rex Kras, MD [facility] 03/19/2015, 10:10 AM

## 2015-05-04 ENCOUNTER — Ambulatory Visit (INDEPENDENT_AMBULATORY_CARE_PROVIDER_SITE_OTHER): Payer: 59 | Admitting: Family

## 2015-05-04 ENCOUNTER — Encounter: Payer: Self-pay | Admitting: Family

## 2015-05-04 VITALS — BP 129/73 | HR 62 | Temp 97.9°F | Ht 65.0 in | Wt 144.4 lb

## 2015-05-04 DIAGNOSIS — M810 Age-related osteoporosis without current pathological fracture: Secondary | ICD-10-CM

## 2015-05-04 DIAGNOSIS — E785 Hyperlipidemia, unspecified: Secondary | ICD-10-CM

## 2015-05-04 DIAGNOSIS — F411 Generalized anxiety disorder: Secondary | ICD-10-CM

## 2015-05-04 DIAGNOSIS — Z0000 Encounter for general adult medical examination without abnormal findings: Secondary | ICD-10-CM | POA: Diagnosis not present

## 2015-05-04 DIAGNOSIS — I10 Essential (primary) hypertension: Secondary | ICD-10-CM

## 2015-05-04 DIAGNOSIS — Z1159 Encounter for screening for other viral diseases: Secondary | ICD-10-CM | POA: Diagnosis not present

## 2015-05-04 DIAGNOSIS — I480 Paroxysmal atrial fibrillation: Secondary | ICD-10-CM

## 2015-05-04 DIAGNOSIS — E559 Vitamin D deficiency, unspecified: Secondary | ICD-10-CM | POA: Diagnosis not present

## 2015-05-04 MED ORDER — DILTIAZEM HCL ER COATED BEADS 120 MG PO TB24: 120.0000 mg | ORAL_TABLET | Freq: Every day | ORAL | Status: DC

## 2015-05-04 MED ORDER — ROSUVASTATIN CALCIUM 20 MG PO TABS: ORAL_TABLET | ORAL | Status: DC

## 2015-05-04 NOTE — Patient Instructions (Signed)
Health Maintenance, Female Adopting a healthy lifestyle and getting preventive care can go a long way to promote health and wellness. Talk with your health care provider about what schedule of regular examinations is right for you. This is a good chance for you to check in with your provider about disease prevention and staying healthy. In between checkups, there are plenty of things you can do on your own. Experts have done a lot of research about which lifestyle changes and preventive measures are most likely to keep you healthy. Ask your health care provider for more information. WEIGHT AND DIET  Eat a healthy diet  Be sure to include plenty of vegetables, fruits, low-fat dairy products, and lean protein.  Do not eat a lot of foods high in solid fats, added sugars, or salt.  Get regular exercise. This is one of the most important things you can do for your health.  Most adults should exercise for at least 150 minutes each week. The exercise should increase your heart rate and make you sweat (moderate-intensity exercise).  Most adults should also do strengthening exercises at least twice a week. This is in addition to the moderate-intensity exercise.  Maintain a healthy weight  Body mass index (BMI) is a measurement that can be used to identify possible weight problems. It estimates body fat based on height and weight. Your health care provider can help determine your BMI and help you achieve or maintain a healthy weight.  For females 20 years of age and older:   A BMI below 18.5 is considered underweight.  A BMI of 18.5 to 24.9 is normal.  A BMI of 25 to 29.9 is considered overweight.  A BMI of 30 and above is considered obese.  Watch levels of cholesterol and blood lipids  You should start having your blood tested for lipids and cholesterol at 68 years of age, then have this test every 5 years.  You may need to have your cholesterol levels checked more often if:  Your lipid  or cholesterol levels are high.  You are older than 68 years of age.  You are at high risk for heart disease.  CANCER SCREENING   Lung Cancer  Lung cancer screening is recommended for adults 55-80 years old who are at high risk for lung cancer because of a history of smoking.  A yearly low-dose CT scan of the lungs is recommended for people who:  Currently smoke.  Have quit within the past 15 years.  Have at least a 30-pack-year history of smoking. A pack year is smoking an average of one pack of cigarettes a day for 1 year.  Yearly screening should continue until it has been 15 years since you quit.  Yearly screening should stop if you develop a health problem that would prevent you from having lung cancer treatment.  Breast Cancer  Practice breast self-awareness. This means understanding how your breasts normally appear and feel.  It also means doing regular breast self-exams. Let your health care provider know about any changes, no matter how small.  If you are in your 20s or 30s, you should have a clinical breast exam (CBE) by a health care provider every 1-3 years as part of a regular health exam.  If you are 40 or older, have a CBE every year. Also consider having a breast X-ray (mammogram) every year.  If you have a family history of breast cancer, talk to your health care provider about genetic screening.  If you   are at high risk for breast cancer, talk to your health care provider about having an MRI and a mammogram every year.  Breast cancer gene (BRCA) assessment is recommended for women who have family members with BRCA-related cancers. BRCA-related cancers include:  Breast.  Ovarian.  Tubal.  Peritoneal cancers.  Results of the assessment will determine the need for genetic counseling and BRCA1 and BRCA2 testing. Cervical Cancer Your health care provider may recommend that you be screened regularly for cancer of the pelvic organs (ovaries, uterus, and  vagina). This screening involves a pelvic examination, including checking for microscopic changes to the surface of your cervix (Pap test). You may be encouraged to have this screening done every 3 years, beginning at age 21.  For women ages 30-65, health care providers may recommend pelvic exams and Pap testing every 3 years, or they may recommend the Pap and pelvic exam, combined with testing for human papilloma virus (HPV), every 5 years. Some types of HPV increase your risk of cervical cancer. Testing for HPV may also be done on women of any age with unclear Pap test results.  Other health care providers may not recommend any screening for nonpregnant women who are considered low risk for pelvic cancer and who do not have symptoms. Ask your health care provider if a screening pelvic exam is right for you.  If you have had past treatment for cervical cancer or a condition that could lead to cancer, you need Pap tests and screening for cancer for at least 20 years after your treatment. If Pap tests have been discontinued, your risk factors (such as having a new sexual partner) need to be reassessed to determine if screening should resume. Some women have medical problems that increase the chance of getting cervical cancer. In these cases, your health care provider may recommend more frequent screening and Pap tests. Colorectal Cancer  This type of cancer can be detected and often prevented.  Routine colorectal cancer screening usually begins at 68 years of age and continues through 68 years of age.  Your health care provider may recommend screening at an earlier age if you have risk factors for colon cancer.  Your health care provider may also recommend using home test kits to check for hidden blood in the stool.  A small camera at the end of a tube can be used to examine your colon directly (sigmoidoscopy or colonoscopy). This is done to check for the earliest forms of colorectal  cancer.  Routine screening usually begins at age 50.  Direct examination of the colon should be repeated every 5-10 years through 68 years of age. However, you may need to be screened more often if early forms of precancerous polyps or small growths are found. Skin Cancer  Check your skin from head to toe regularly.  Tell your health care provider about any new moles or changes in moles, especially if there is a change in a mole's shape or color.  Also tell your health care provider if you have a mole that is larger than the size of a pencil eraser.  Always use sunscreen. Apply sunscreen liberally and repeatedly throughout the day.  Protect yourself by wearing long sleeves, pants, a wide-brimmed hat, and sunglasses whenever you are outside. HEART DISEASE, DIABETES, AND HIGH BLOOD PRESSURE   High blood pressure causes heart disease and increases the risk of stroke. High blood pressure is more likely to develop in:  People who have blood pressure in the high end   of the normal range (130-139/85-89 mm Hg).  People who are overweight or obese.  People who are African American.  If you are 38-23 years of age, have your blood pressure checked every 3-5 years. If you are 61 years of age or older, have your blood pressure checked every year. You should have your blood pressure measured twice--once when you are at a hospital or clinic, and once when you are not at a hospital or clinic. Record the average of the two measurements. To check your blood pressure when you are not at a hospital or clinic, you can use:  An automated blood pressure machine at a pharmacy.  A home blood pressure monitor.  If you are between 45 years and 39 years old, ask your health care provider if you should take aspirin to prevent strokes.  Have regular diabetes screenings. This involves taking a blood sample to check your fasting blood sugar level.  If you are at a normal weight and have a low risk for diabetes,  have this test once every three years after 68 years of age.  If you are overweight and have a high risk for diabetes, consider being tested at a younger age or more often. PREVENTING INFECTION  Hepatitis B  If you have a higher risk for hepatitis B, you should be screened for this virus. You are considered at high risk for hepatitis B if:  You were born in a country where hepatitis B is common. Ask your health care provider which countries are considered high risk.  Your parents were born in a high-risk country, and you have not been immunized against hepatitis B (hepatitis B vaccine).  You have HIV or AIDS.  You use needles to inject street drugs.  You live with someone who has hepatitis B.  You have had sex with someone who has hepatitis B.  You get hemodialysis treatment.  You take certain medicines for conditions, including cancer, organ transplantation, and autoimmune conditions. Hepatitis C  Blood testing is recommended for:  Everyone born from [birth year] through 1963.  Anyone with known risk factors for hepatitis C. Sexually transmitted infections (STIs)  You should be screened for sexually transmitted infections (STIs) including gonorrhea and chlamydia if:  You are sexually active and are younger than 68 years of age.  You are older than 68 years of age and your health care provider tells you that you are at risk for this type of infection.  Your sexual activity has changed since you were last screened and you are at an increased risk for chlamydia or gonorrhea. Ask your health care provider if you are at risk.  If you do not have HIV, but are at risk, it may be recommended that you take a prescription medicine daily to prevent HIV infection. This is called pre-exposure prophylaxis (PrEP). You are considered at risk if:  You are sexually active and do not regularly use condoms or know the HIV status of your partner(s).  You take drugs by injection.  You are sexually  active with a partner who has HIV. Talk with your health care provider about whether you are at high risk of being infected with HIV. If you choose to begin PrEP, you should first be tested for HIV. You should then be tested every 3 months for as long as you are taking PrEP.  PREGNANCY   If you are premenopausal and you may become pregnant, ask your health care provider about preconception counseling.  If you may  become pregnant, take 400 to 800 micrograms (mcg) of folic acid every day.  If you want to prevent pregnancy, talk to your health care provider about birth control (contraception). OSTEOPOROSIS AND MENOPAUSE   Osteoporosis is a disease in which the bones lose minerals and strength with aging. This can result in serious bone fractures. Your risk for osteoporosis can be identified using a bone density scan.  If you are 61 years of age or older, or if you are at risk for osteoporosis and fractures, ask your health care provider if you should be screened.  Ask your health care provider whether you should take a calcium or vitamin D supplement to lower your risk for osteoporosis.  Menopause may have certain physical symptoms and risks.  Hormone replacement therapy may reduce some of these symptoms and risks. Talk to your health care provider about whether hormone replacement therapy is right for you.  HOME CARE INSTRUCTIONS   Schedule regular health, dental, and eye exams.  Stay current with your immunizations.   Do not use any tobacco products including cigarettes, chewing tobacco, or electronic cigarettes.  If you are pregnant, do not drink alcohol.  If you are breastfeeding, limit how much and how often you drink alcohol.  Limit alcohol intake to no more than 1 drink per day for nonpregnant women. One drink equals 12 ounces of beer, 5 ounces of wine, or 1 ounces of hard liquor.  Do not use street drugs.  Do not share needles.  Ask your health care provider for help if  you need support or information about quitting drugs.  Tell your health care provider if you often feel depressed.  Tell your health care provider if you have ever been abused or do not feel safe at home.   This information is not intended to replace advice given to you by your health care provider. Make sure you discuss any questions you have with your health care provider.   Document Released: 10/24/2010 Document Revised: 05/01/2014 Document Reviewed: 03/12/2013 Elsevier Interactive Patient Education [employer].

## 2015-05-04 NOTE — Progress Notes (Signed)
Subjective:    Patient ID: Kathryn Arnold, female    DOB: [birth year], 70 y.o.   MRN: [id number]  Pt presents to the office today for CPE without PAP.  Hypertension This is a chronic problem. The current episode started more than 1 year ago. The problem has been resolved since onset. The problem is controlled. Associated symptoms include anxiety and palpitations ("every now and then"). Pertinent negatives include no headaches, peripheral edema or shortness of breath. Risk factors for coronary artery disease include dyslipidemia, post-menopausal state and family history. Past treatments include beta blockers and calcium channel blockers. The current treatment provides mild improvement. There is no history of kidney disease, CAD/MI, CVA, heart failure or a thyroid problem. There is no history of sleep apnea.  Hyperlipidemia This is a chronic problem. The current episode started more than 1 year ago. The problem is controlled. Recent lipid tests were reviewed and are normal. She has no history of diabetes or hypothyroidism. Pertinent negatives include no leg pain, myalgias or shortness of breath. Current antihyperlipidemic treatment includes statins. The current treatment provides moderate improvement of lipids. Risk factors for coronary artery disease include dyslipidemia, family history, hypertension and post-menopausal.  Anxiety Presents for follow-up visit. The problem has been gradually improving. Symptoms include palpitations ("every now and then"). Patient reports no decreased concentration, depressed mood, excessive worry, irritability, nervous/anxious behavior or shortness of breath. Symptoms occur rarely.   Her past medical history is significant for anxiety/panic attacks and depression. There is no history of CAD. Past treatments include SSRIs and benzodiazephines.      Review of Systems  Constitutional: Negative.  Negative for irritability.  HENT: Negative.   Eyes: Negative.     Respiratory: Negative.  Negative for shortness of breath.   Cardiovascular: Positive for palpitations ("every now and then").  Gastrointestinal: Negative.   Endocrine: Negative.   Genitourinary: Negative.   Musculoskeletal: Negative.  Negative for myalgias.  Neurological: Negative.  Negative for headaches.  Hematological: Negative.   Psychiatric/Behavioral: Negative.  Negative for decreased concentration. The patient is not nervous/anxious.   All other systems reviewed and are negative.      Objective:   Physical Exam  Constitutional: She is oriented to person, place, and time. She appears well-developed and well-nourished. No distress.  HENT:  Head: Normocephalic and atraumatic.  Right Ear: External ear normal.  Left Ear: External ear normal.  Nose: Nose normal.  Mouth/Throat: Oropharynx is clear and moist.  Eyes: Pupils are equal, round, and reactive to light.  Neck: Normal range of motion. Neck supple. No thyromegaly present.  Cardiovascular: Normal rate, regular rhythm, normal heart sounds and intact distal pulses.   No murmur heard. Pulmonary/Chest: Effort normal and breath sounds normal. No respiratory distress. She has no wheezes.  Abdominal: Soft. Bowel sounds are normal. She exhibits no distension. There is no tenderness.  Musculoskeletal: Normal range of motion. She exhibits no edema or tenderness.  Neurological: She is alert and oriented to person, place, and time. She has normal reflexes. No cranial nerve deficit.  Skin: Skin is warm and dry.  Psychiatric: She has a normal mood and affect. Her behavior is normal. Judgment and thought content normal.  Vitals reviewed.     BP 129/73 mmHg  Pulse 62  Temp(Src) 97.9 F (36.6 C) (Oral)  Ht _0  (1.651 m)  Wt 144 lb 6.4 oz (65.499 kg)  BMI 24.03 kg/m2     Assessment & Plan:  1. Essential hypertension, benign - CMP14+EGFR - diltiazem (CARDIZEM  LA) 120 MG 24 hr tablet; Take 1 tablet (120 mg total) by mouth  daily.  Dispense: 90 tablet; Refill: 0  2. Paroxysmal atrial fibrillation (HCC) - CMP14+EGFR - diltiazem (CARDIZEM LA) 120 MG 24 hr tablet; Take 1 tablet (120 mg total) by mouth daily.  Dispense: 90 tablet; Refill: 0  3. Osteoporosis - CMP14+EGFR  4. Vitamin D deficiency - CMP14+EGFR - VITAMIN D 25 Hydroxy (Vit-D Deficiency, Fractures)  5. Hyperlipidemia with target LDL less than 100 - Lipid panel - CMP14+EGFR - rosuvastatin (CRESTOR) 20 MG tablet; Take 1 tablet by mouth  daily  Dispense: 90 tablet; Refill: 2  6. GAD (generalized anxiety disorder) - CMP14+EGFR  7. Annual physical exam - Anemia Profile B - Lipid panel - CMP14+EGFR - Thyroid Panel With TSH - VITAMIN D 25 Hydroxy (Vit-D Deficiency, Fractures) - Hepatitis C antibody  8. Need for hepatitis C screening test - CMP14+EGFR - Hepatitis C antibody   Continue all meds Labs pending Health Maintenance reviewed Diet and exercise encouraged RTO 6 months   Evelina Dun, FNP

## 2015-05-04 NOTE — Addendum Note (Signed)
Addended by: Prescott GumLAND, Tyreese Thain M on: 05/04/2015 10:53 AM   Modules accepted: Kipp BroodSmartSet

## 2015-05-05 LAB — CMP14+EGFR
ALBUMIN: 4.6 g/dL (ref 3.6–4.8)
ALK PHOS: 47 IU/L (ref 39–117)
ALT: 20 IU/L (ref 0–32)
AST: 35 IU/L (ref 0–40)
Albumin/Globulin Ratio: 1.7 (ref 1.1–2.5)
BILIRUBIN TOTAL: 0.3 mg/dL (ref 0.0–1.2)
BUN / CREAT RATIO: 20 (ref 11–26)
BUN: 17 mg/dL (ref 8–27)
CHLORIDE: 102 mmol/L (ref 96–106)
CO2: 19 mmol/L (ref 18–29)
CREATININE: 0.84 mg/dL (ref 0.57–1.00)
Calcium: 9.3 mg/dL (ref 8.7–10.3)
GFR calc non Af Amer: 72 mL/min/{1.73_m2} (ref >59)
GFR, EST AFRICAN AMERICAN: 83 mL/min/{1.73_m2} (ref >59)
GLUCOSE: 96 mg/dL (ref 65–99)
Globulin, Total: 2.7 g/dL (ref 1.5–4.5)
Potassium: 4.8 mmol/L (ref 3.5–5.2)
Sodium: 144 mmol/L (ref 134–144)
TOTAL PROTEIN: 7.3 g/dL (ref 6.0–8.5)

## 2015-05-05 LAB — ANEMIA PROFILE B
BASOS: 1 %
Basophils Absolute: 0 10*3/uL (ref 0.0–0.2)
EOS (ABSOLUTE): 0.1 10*3/uL (ref 0.0–0.4)
EOS: 2 %
FERRITIN: 116 ng/mL (ref 15–150)
Folate: >20 ng/mL (ref >3.0)
HEMATOCRIT: 42.7 % (ref 34.0–46.6)
HEMOGLOBIN: 14.6 g/dL (ref 11.1–15.9)
IRON SATURATION: 29 % (ref 15–55)
Immature Grans (Abs): 0 10*3/uL (ref 0.0–0.1)
Immature Granulocytes: 0 %
Iron: 106 ug/dL (ref 27–139)
Lymphocytes Absolute: 1.5 10*3/uL (ref 0.7–3.1)
Lymphs: 32 %
MCH: 30.4 pg (ref 26.6–33.0)
MCHC: 34.2 g/dL (ref 31.5–35.7)
MCV: 89 fL (ref 79–97)
MONOCYTES: 7 %
MONOS ABS: 0.3 10*3/uL (ref 0.1–0.9)
NEUTROS ABS: 2.7 10*3/uL (ref 1.4–7.0)
Neutrophils: 58 %
Platelets: 261 10*3/uL (ref 150–379)
RBC: 4.8 x10E6/uL (ref 3.77–5.28)
RDW: 12.8 % (ref 12.3–15.4)
Retic Ct Pct: 0.8 % (ref 0.6–2.6)
Total Iron Binding Capacity: 362 ug/dL (ref 250–450)
UIBC: 256 ug/dL (ref 118–369)
VITAMIN B 12: 838 pg/mL (ref 211–946)
WBC: 4.7 10*3/uL (ref 3.4–10.8)

## 2015-05-05 LAB — LIPID PANEL
CHOL/HDL RATIO: 3.3 ratio (ref 0.0–4.4)
Cholesterol, Total: 181 mg/dL (ref 100–199)
HDL: 55 mg/dL (ref >39)
LDL Calculated: 90 mg/dL (ref 0–99)
Triglycerides: 182 mg/dL — ABNORMAL HIGH (ref 0–149)
VLDL Cholesterol Cal: 36 mg/dL (ref 5–40)

## 2015-05-05 LAB — VITAMIN D 25 HYDROXY (VIT D DEFICIENCY, FRACTURES): VIT D 25 HYDROXY: 35 ng/mL (ref 30.0–100.0)

## 2015-05-05 LAB — HEPATITIS C ANTIBODY: Hep C Virus Ab: <0.1 s/co ratio (ref 0.0–0.9)

## 2015-05-05 LAB — THYROID PANEL WITH TSH
FREE THYROXINE INDEX: 2 (ref 1.2–4.9)
T3 UPTAKE RATIO: 26 % (ref 24–39)
T4, Total: 7.8 ug/dL (ref 4.5–12.0)
TSH: 1.81 u[IU]/mL (ref 0.450–4.500)

## 2015-05-06 ENCOUNTER — Telehealth: Payer: Self-pay | Admitting: Family

## 2015-05-06 NOTE — Telephone Encounter (Signed)
Yes; aware.

## 2015-05-10 ENCOUNTER — Telehealth: Payer: Self-pay | Admitting: Family

## 2015-05-11 ENCOUNTER — Ambulatory Visit (HOSPITAL_COMMUNITY)
Admission: RE | Admit: 2015-05-11 | Discharge: 2015-05-11 | Disposition: A | Discharge status: Home / Self Care | Payer: 59 | Source: Ambulatory Visit | Attending: Nurse Practitioner | Admitting: Nurse Practitioner

## 2015-05-11 ENCOUNTER — Encounter (HOSPITAL_COMMUNITY): Payer: Self-pay | Admitting: Nurse Practitioner

## 2015-05-11 VITALS — BP 120/78 | HR 61 | Wt 145.2 lb

## 2015-05-11 DIAGNOSIS — Z0189 Encounter for other specified special examinations: Secondary | ICD-10-CM | POA: Diagnosis present

## 2015-05-11 DIAGNOSIS — E785 Hyperlipidemia, unspecified: Secondary | ICD-10-CM | POA: Diagnosis not present

## 2015-05-11 DIAGNOSIS — B009 Herpesviral infection, unspecified: Secondary | ICD-10-CM | POA: Insufficient documentation

## 2015-05-11 DIAGNOSIS — A630 Anogenital (venereal) warts: Secondary | ICD-10-CM | POA: Diagnosis not present

## 2015-05-11 DIAGNOSIS — E7800 Pure hypercholesterolemia, unspecified: Secondary | ICD-10-CM | POA: Insufficient documentation

## 2015-05-11 DIAGNOSIS — E559 Vitamin D deficiency, unspecified: Secondary | ICD-10-CM | POA: Diagnosis not present

## 2015-05-11 DIAGNOSIS — I4892 Unspecified atrial flutter: Secondary | ICD-10-CM | POA: Diagnosis present

## 2015-05-11 DIAGNOSIS — I480 Paroxysmal atrial fibrillation: Secondary | ICD-10-CM

## 2015-05-11 DIAGNOSIS — I4891 Unspecified atrial fibrillation: Secondary | ICD-10-CM | POA: Insufficient documentation

## 2015-05-11 MED ORDER — DILTIAZEM HCL 60 MG PO TABS: ORAL_TABLET | ORAL | Status: DC

## 2015-05-11 MED ORDER — FLECAINIDE ACETATE 150 MG PO TABS: ORAL_TABLET | ORAL | Status: DC

## 2015-05-11 NOTE — Telephone Encounter (Signed)
Pt just needed to know what her glucose level was. Pt given glucose level and pt voiced understanding.

## 2015-05-11 NOTE — Progress Notes (Signed)
Patient ID: Kathryn Arnold, female   DOB: [birth year], 70 y.o.   MRN: [id number]     Primary Care Physician: [name], FNP Referring Physician: [facility] f/u   Kathryn Arnold is a 70 y.o. female with a h/o afib s/p PVI and aflutter abaltion 11/03/14 for f/u in the afib clinic. visit this. She had been doing well maintaining SR.  She is off flecainide, but does have 150 mg to use as needed for breakthrough afib. She did notice two episodes at Christmas rushing to get things done that her heart was racing. She sat down and rested and it resolved without drugs.Continues on xarelto without bleeding issues.  She exercises on a regular basis and is normal weight, no tobacco/alcohol/ caffeine use.  Today, she denies symptoms of palpitations, chest pain, shortness of breath, orthopnea, PND, lower extremity edema, dizziness, presyncope, syncope, or neurologic sequela. The patient is tolerating medications without difficulties and is otherwise without complaint today.   Past Medical History  Diagnosis Date  . Abnormal Pap smear   . 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 Complication of anesthesia     alittle slow to wake up-stayed drowsy  . Hypercholesteremia   . Osteoporosis, unspecified     Last DEXA 10/2011   . Hyperlipidemia with target LDL less than 100   . Vitamin D deficiency   . HSV-2 (herpes simplex virus 2) infection   . Paroxysmal atrial fibrillation (HCC)     s/p ablation Afib and flutter 11/03/14   Past Surgical History  Procedure Laterality Date  . Breast lumpectomy    . Dilation and curettage of uterus    . Laparoscopy abdomen diagnostic    . Electrophysiologic study N/A 11/03/2014    Procedure: Atrial Fibrillation Ablation;  Surgeon: Hillis Range, MD;  Location: [facility] INVASIVE CV LAB;  Service: Cardiovascular;  Laterality: N/A;    Current Outpatient Prescriptions  Medication Sig Dispense Refill  . alendronate (FOSAMAX) 70 MG tablet Take 1 tablet (70 mg total) by mouth every 7  (seven) days. Take with a full glass of water on an empty stomach. 12 tablet 3  . Calcium Carbonate (CALCIUM 600 PO) Take 1 tablet by mouth 2 (two) times daily.    . Coenzyme Q10 (CO Q 10) 100 MG CAPS Take 1 capsule by mouth daily.    [name] diltiazem (CARDIZEM LA) 120 MG 24 hr tablet Take 1 tablet (120 mg total) by mouth daily. 90 tablet 0  . diltiazem (CARDIZEM) 60 MG tablet Take 1/2 to 1 tablet every 4 hours AS NEEDED for heart rate >100 as long as blood pressure >100 45 tablet 1  . Magnesium 250 MG TABS Take 2 tablets by mouth daily.    . Multiple Vitamins-Minerals (CENTRUM SILVER ULTRA WOMENS PO) Take 1 capsule by mouth daily.     . rivaroxaban (XARELTO) 20 MG TABS tablet Take 1 tablet (20 mg total) by mouth daily with supper. 90 tablet 3  . rosuvastatin (CRESTOR) 20 MG tablet Take 1 tablet by mouth  daily 90 tablet 2  . valACYclovir (VALTREX) 500 MG tablet Take 1 tablet by mouth  daily 90 tablet 1  . vitamin C (ASCORBIC ACID) 500 MG tablet Take 500 mg by mouth 2 (two) times daily.     . flecainide (TAMBOCOR) 150 MG tablet Take 1 tablet by mouth as needed for afib 20 tablet 1   No current facility-administered medications for this encounter.    No Known Allergies  Social History  Social History  . Marital Status: Married    Spouse Name: N/A  . Number of Children: N/A  . Years of Education: N/A   Occupational History  . Not on file.   Social History Main Topics  . Smoking status: Never Smoker   . Smokeless tobacco: Not on file  . Alcohol Use: No  . Drug Use: No  . Sexual Activity: Yes    Birth Control/ Protection: None   Other Topics Concern  . Not on file   Social History Narrative   Lives in [place] with spouse.    Family History  Problem Relation Age of Onset  . Other Neg Hx   . Osteoporosis Mother   . Cancer Mother     breast  . Hypertension Mother   . Heart disease Father     ROS- All systems are reviewed and negative except as per the HPI above  Physical  Exam: Filed Vitals:   05/11/15 1124  BP: 120/78  Pulse: 61  Weight: 145 lb 3.2 oz (65.862 kg)    GEN- The patient is well appearing, alert and oriented x 3 today.   Head- normocephalic, atraumatic Eyes-  Sclera clear, conjunctiva pink Ears- hearing intact Oropharynx- clear Neck- supple, no JVP Lymph- no cervical lymphadenopathy Lungs- Clear to ausculation bilaterally, normal work of breathing Heart- Regular rate and rhythm, no murmurs, rubs or gallops, PMI not laterally displaced GI- soft, NT, ND, + BS Extremities- no clubbing, cyanosis, or edema MS- no significant deformity or atrophy Skin- no rash or lesion Psych- euthymic mood, full affect Neuro- strength and sensation are intact  EKG-SR with IRBBB at 61 bpm, Pr int 150 ms, QRS 100 ms, QTc 432 ms.  Epic records reviewed  Assessment and Plan: 1. Afib Doing well s/p ablation in SR  Continue xarelto We discussed how to use short acting Cardizem/flecainide if afib should return   F/u afib clinic in 3 months   Lupita Leash C. [facility] [address] [place], [place] [address] [phone]

## 2015-05-19 ENCOUNTER — Encounter: Payer: Self-pay | Admitting: *Deleted

## 2015-06-08 ENCOUNTER — Telehealth: Payer: Self-pay | Admitting: Family

## 2015-06-08 DIAGNOSIS — B009 Herpesviral infection, unspecified: Secondary | ICD-10-CM

## 2015-06-08 MED ORDER — VALACYCLOVIR HCL 500 MG PO TABS: ORAL_TABLET | ORAL | Status: DC

## 2015-06-08 NOTE — Telephone Encounter (Signed)
Prescription sent to pharmacy.

## 2015-06-08 NOTE — Telephone Encounter (Signed)
Aware, script sent in. 

## 2015-08-11 ENCOUNTER — Encounter (HOSPITAL_COMMUNITY): Payer: Self-pay | Admitting: Nurse Practitioner

## 2015-08-11 ENCOUNTER — Ambulatory Visit (HOSPITAL_COMMUNITY)
Admission: RE | Admit: 2015-08-11 | Discharge: 2015-08-11 | Disposition: A | Discharge status: Home / Self Care | Payer: 59 | Source: Ambulatory Visit | Attending: Nurse Practitioner | Admitting: Nurse Practitioner

## 2015-08-11 VITALS — BP 132/80 | HR 63 | Ht 65.0 in | Wt 149.6 lb

## 2015-08-11 DIAGNOSIS — E559 Vitamin D deficiency, unspecified: Secondary | ICD-10-CM | POA: Insufficient documentation

## 2015-08-11 DIAGNOSIS — Z7901 Long term (current) use of anticoagulants: Secondary | ICD-10-CM | POA: Diagnosis not present

## 2015-08-11 DIAGNOSIS — Z7983 Long term (current) use of bisphosphonates: Secondary | ICD-10-CM | POA: Insufficient documentation

## 2015-08-11 DIAGNOSIS — Z79899 Other long term (current) drug therapy: Secondary | ICD-10-CM | POA: Insufficient documentation

## 2015-08-11 DIAGNOSIS — Z8249 Family history of ischemic heart disease and other diseases of the circulatory system: Secondary | ICD-10-CM | POA: Insufficient documentation

## 2015-08-11 DIAGNOSIS — Z9889 Other specified postprocedural states: Secondary | ICD-10-CM | POA: Diagnosis not present

## 2015-08-11 DIAGNOSIS — E785 Hyperlipidemia, unspecified: Secondary | ICD-10-CM | POA: Insufficient documentation

## 2015-08-11 DIAGNOSIS — I4891 Unspecified atrial fibrillation: Secondary | ICD-10-CM | POA: Diagnosis not present

## 2015-08-11 DIAGNOSIS — Z8679 Personal history of other diseases of the circulatory system: Secondary | ICD-10-CM | POA: Diagnosis not present

## 2015-08-11 DIAGNOSIS — M810 Age-related osteoporosis without current pathological fracture: Secondary | ICD-10-CM | POA: Insufficient documentation

## 2015-08-11 DIAGNOSIS — E7800 Pure hypercholesterolemia, unspecified: Secondary | ICD-10-CM | POA: Diagnosis not present

## 2015-08-11 DIAGNOSIS — Z8262 Family history of osteoporosis: Secondary | ICD-10-CM | POA: Diagnosis not present

## 2015-08-12 NOTE — Progress Notes (Signed)
Patient ID: Kathryn BillsShirley L Krone, female   DOB: 1945-12-10, 70 y.o.   MRN: [id number]     Primary Care Physician: Jannifer Rodneyhristy Hawks, FNP Referring Physician: Dr. Tyrone SchimkeAllred   Paw L Plourde is a 70 y.o. female with a h/o afib ablation 11/03/14. She is doing well without any afib. She is trying to eat healthy and exercise. She feels very good.No issues with blood thinner.  Today, she denies symptoms of palpitations, chest pain, shortness of breath, orthopnea, PND, lower extremity edema, dizziness, presyncope, syncope, or neurologic sequela. The patient is tolerating medications without difficulties and is otherwise without complaint today.   Past Medical History  Diagnosis Date  . Abnormal Pap smear   . 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 Complication of anesthesia     alittle slow to wake up-stayed drowsy  . Hypercholesteremia   . Osteoporosis, unspecified     Last DEXA 10/2011   . Hyperlipidemia with target LDL less than 100   . Vitamin D deficiency   . HSV-2 (herpes simplex virus 2) infection   . Paroxysmal atrial fibrillation (HCC)     s/p ablation Afib and flutter 11/03/14   Past Surgical History  Procedure Laterality Date  . Breast lumpectomy    . Dilation and curettage of uterus    . Laparoscopy abdomen diagnostic    . Electrophysiologic study N/A 11/03/2014    Procedure: Atrial Fibrillation Ablation;  Surgeon: Hillis RangeJames Allred, MD;  [facility];  Service: Cardiovascular;  Laterality: N/A;    Current Outpatient Prescriptions  Medication Sig Dispense Refill  . alendronate (FOSAMAX) 70 MG tablet Take 1 tablet (70 mg total) by mouth every 7 (seven) days. Take with a full glass of water on an empty stomach. 12 tablet 3  . Calcium Carbonate (CALCIUM 600 PO) Take 1 tablet by mouth 2 (two) times daily.    . Coenzyme Q10 (CO Q 10) 100 MG CAPS Take 1 capsule by mouth daily.    [name]. diltiazem (CARDIZEM LA) 120 MG 24 hr tablet Take 1 tablet (120 mg total) by mouth daily. 90  tablet 0  . diltiazem (CARDIZEM) 60 MG tablet Take 1/2 to 1 tablet every 4 hours AS NEEDED for heart rate >100 as long as blood pressure >100 45 tablet 1  . flecainide (TAMBOCOR) 150 MG tablet Take 1 tablet by mouth as needed for afib 20 tablet 1  . Magnesium 250 MG TABS Take 2 tablets by mouth daily.    . Multiple Vitamins-Minerals (CENTRUM SILVER ULTRA WOMENS PO) Take 1 capsule by mouth daily.     . rivaroxaban (XARELTO) 20 MG TABS tablet Take 1 tablet (20 mg total) by mouth daily with supper. 90 tablet 3  . rosuvastatin (CRESTOR) 20 MG tablet Take 1 tablet by mouth  daily 90 tablet 2  . valACYclovir (VALTREX) 500 MG tablet Take 1 tablet by mouth  daily 90 tablet 1  . vitamin C (ASCORBIC ACID) 500 MG tablet Take 500 mg by mouth 2 (two) times daily.      No current facility-administered medications for this encounter.    No Known Allergies  Social History   Social History  . Marital Status: Married    Spouse Name: N/A  . Number of Children: N/A  . Years of Education: N/A   Occupational History  . Not on file.   Social History Main Topics  . Smoking status: Never Smoker   . Smokeless tobacco: Not on file  . Alcohol Use: No  .  Drug Use: No  . Sexual Activity: Yes    Birth Control/ Protection: None   Other Topics Concern  . Not on file   Social History Narrative   Lives in [place] with spouse.    Family History  Problem Relation Age of Onset  . Other Neg Hx   . Osteoporosis Mother   . Cancer Mother     breast  . Hypertension Mother   . Heart disease Father     ROS- All systems are reviewed and negative except as per the HPI above  Physical Exam: Filed Vitals:   08/11/15 1107  BP: 132/80  Pulse: 63  Height:  (1.651 m)  Weight: 149 lb 9.6 oz (67.858 kg)    GEN- The patient is well appearing, alert and oriented x 3 today.   Head- normocephalic, atraumatic Eyes-  Sclera clear, conjunctiva pink Ears- hearing intact Oropharynx- clear Neck- supple, no  JVP Lymph- no cervical lymphadenopathy Lungs- Clear to ausculation bilaterally, normal work of breathing Heart- Regular rate and rhythm, no murmurs, rubs or gallops, PMI not laterally displaced GI- soft, NT, ND, + BS Extremities- no clubbing, cyanosis, or edema MS- no significant deformity or atrophy Skin- no rash or lesion Psych- euthymic mood, full affect Neuro- strength and sensation are intact  EKG- SR 63 bpm, pr int 152 ms, qrs int 94 ms, qtc 421 ms   Assessment and Plan: 1. Afib s/p ablation Doing well maintainig SR No issues with xarelto chadsvasc score of at least 2 Congratulated on healthy lifestyle  F/u 3-4 months    Lupita Leash C. [facility] [address] [place], [place] [address] [phone]

## 2015-08-13 ENCOUNTER — Telehealth: Payer: Self-pay | Admitting: Family

## 2015-08-13 DIAGNOSIS — I10 Essential (primary) hypertension: Secondary | ICD-10-CM

## 2015-08-13 DIAGNOSIS — I480 Paroxysmal atrial fibrillation: Secondary | ICD-10-CM

## 2015-08-13 MED ORDER — DILTIAZEM HCL ER COATED BEADS 120 MG PO TB24: 120.0000 mg | ORAL_TABLET | Freq: Every day | ORAL | Status: DC

## 2015-08-13 NOTE — Telephone Encounter (Signed)
done

## 2015-10-28 ENCOUNTER — Ambulatory Visit (INDEPENDENT_AMBULATORY_CARE_PROVIDER_SITE_OTHER): Payer: 59 | Admitting: Family

## 2015-10-28 ENCOUNTER — Encounter: Payer: Self-pay | Admitting: Family

## 2015-10-28 VITALS — BP 131/81 | HR 60 | Temp 97.8°F | Ht 65.0 in | Wt 151.6 lb

## 2015-10-28 DIAGNOSIS — E785 Hyperlipidemia, unspecified: Secondary | ICD-10-CM | POA: Diagnosis not present

## 2015-10-28 DIAGNOSIS — M810 Age-related osteoporosis without current pathological fracture: Secondary | ICD-10-CM

## 2015-10-28 DIAGNOSIS — I480 Paroxysmal atrial fibrillation: Secondary | ICD-10-CM | POA: Diagnosis not present

## 2015-10-28 DIAGNOSIS — F411 Generalized anxiety disorder: Secondary | ICD-10-CM | POA: Diagnosis not present

## 2015-10-28 DIAGNOSIS — I10 Essential (primary) hypertension: Secondary | ICD-10-CM | POA: Diagnosis not present

## 2015-10-28 DIAGNOSIS — E559 Vitamin D deficiency, unspecified: Secondary | ICD-10-CM | POA: Diagnosis not present

## 2015-10-28 DIAGNOSIS — B009 Herpesviral infection, unspecified: Secondary | ICD-10-CM

## 2015-10-28 MED ORDER — DILTIAZEM HCL ER COATED BEADS 120 MG PO TB24: 120.0000 mg | ORAL_TABLET | Freq: Every day | ORAL | Status: DC

## 2015-10-28 NOTE — Patient Instructions (Signed)
Health Maintenance, Female Adopting a healthy lifestyle and getting preventive care can go a long way to promote health and wellness. Talk with your health care provider about what schedule of regular examinations is right for you. This is a good chance for you to check in with your provider about disease prevention and staying healthy. In between checkups, there are plenty of things you can do on your own. Experts have done a lot of research about which lifestyle changes and preventive measures are most likely to keep you healthy. Ask your health care provider for more information. WEIGHT AND DIET  Eat a healthy diet  Be sure to include plenty of vegetables, fruits, low-fat dairy products, and lean protein.  Do not eat a lot of foods high in solid fats, added sugars, or salt.  Get regular exercise. This is one of the most important things you can do for your health.  Most adults should exercise for at least 150 minutes each week. The exercise should increase your heart rate and make you sweat (moderate-intensity exercise).  Most adults should also do strengthening exercises at least twice a week. This is in addition to the moderate-intensity exercise.  Maintain a healthy weight  Body mass index (BMI) is a measurement that can be used to identify possible weight problems. It estimates body fat based on height and weight. Your health care provider can help determine your BMI and help you achieve or maintain a healthy weight.  For females 20 years of age and older:   A BMI below 18.5 is considered underweight.  A BMI of 18.5 to 24.9 is normal.  A BMI of 25 to 29.9 is considered overweight.  A BMI of 30 and above is considered obese.  Watch levels of cholesterol and blood lipids  You should start having your blood tested for lipids and cholesterol at 68 years of age, then have this test every 5 years.  You may need to have your cholesterol levels checked more often if:  Your lipid  or cholesterol levels are high.  You are older than 68 years of age.  You are at high risk for heart disease.  CANCER SCREENING   Lung Cancer  Lung cancer screening is recommended for adults 55-80 years old who are at high risk for lung cancer because of a history of smoking.  A yearly low-dose CT scan of the lungs is recommended for people who:  Currently smoke.  Have quit within the past 15 years.  Have at least a 30-pack-year history of smoking. A pack year is smoking an average of one pack of cigarettes a day for 1 year.  Yearly screening should continue until it has been 15 years since you quit.  Yearly screening should stop if you develop a health problem that would prevent you from having lung cancer treatment.  Breast Cancer  Practice breast self-awareness. This means understanding how your breasts normally appear and feel.  It also means doing regular breast self-exams. Let your health care provider know about any changes, no matter how small.  If you are in your 20s or 30s, you should have a clinical breast exam (CBE) by a health care provider every 1-3 years as part of a regular health exam.  If you are 40 or older, have a CBE every year. Also consider having a breast X-ray (mammogram) every year.  If you have a family history of breast cancer, talk to your health care provider about genetic screening.  If you   are at high risk for breast cancer, talk to your health care provider about having an MRI and a mammogram every year.  Breast cancer gene (BRCA) assessment is recommended for women who have family members with BRCA-related cancers. BRCA-related cancers include:  Breast.  Ovarian.  Tubal.  Peritoneal cancers.  Results of the assessment will determine the need for genetic counseling and BRCA1 and BRCA2 testing. Cervical Cancer Your health care provider may recommend that you be screened regularly for cancer of the pelvic organs (ovaries, uterus, and  vagina). This screening involves a pelvic examination, including checking for microscopic changes to the surface of your cervix (Pap test). You may be encouraged to have this screening done every 3 years, beginning at age 21.  For women ages 30-65, health care providers may recommend pelvic exams and Pap testing every 3 years, or they may recommend the Pap and pelvic exam, combined with testing for human papilloma virus (HPV), every 5 years. Some types of HPV increase your risk of cervical cancer. Testing for HPV may also be done on women of any age with unclear Pap test results.  Other health care providers may not recommend any screening for nonpregnant women who are considered low risk for pelvic cancer and who do not have symptoms. Ask your health care provider if a screening pelvic exam is right for you.  If you have had past treatment for cervical cancer or a condition that could lead to cancer, you need Pap tests and screening for cancer for at least 20 years after your treatment. If Pap tests have been discontinued, your risk factors (such as having a new sexual partner) need to be reassessed to determine if screening should resume. Some women have medical problems that increase the chance of getting cervical cancer. In these cases, your health care provider may recommend more frequent screening and Pap tests. Colorectal Cancer  This type of cancer can be detected and often prevented.  Routine colorectal cancer screening usually begins at 68 years of age and continues through 68 years of age.  Your health care provider may recommend screening at an earlier age if you have risk factors for colon cancer.  Your health care provider may also recommend using home test kits to check for hidden blood in the stool.  A small camera at the end of a tube can be used to examine your colon directly (sigmoidoscopy or colonoscopy). This is done to check for the earliest forms of colorectal  cancer.  Routine screening usually begins at age 50.  Direct examination of the colon should be repeated every 5-10 years through 68 years of age. However, you may need to be screened more often if early forms of precancerous polyps or small growths are found. Skin Cancer  Check your skin from head to toe regularly.  Tell your health care provider about any new moles or changes in moles, especially if there is a change in a mole's shape or color.  Also tell your health care provider if you have a mole that is larger than the size of a pencil eraser.  Always use sunscreen. Apply sunscreen liberally and repeatedly throughout the day.  Protect yourself by wearing long sleeves, pants, a wide-brimmed hat, and sunglasses whenever you are outside. HEART DISEASE, DIABETES, AND HIGH BLOOD PRESSURE   High blood pressure causes heart disease and increases the risk of stroke. High blood pressure is more likely to develop in:  People who have blood pressure in the high end   of the normal range (130-139/85-89 mm Hg).  People who are overweight or obese.  People who are African American.  If you are 38-23 years of age, have your blood pressure checked every 3-5 years. If you are 61 years of age or older, have your blood pressure checked every year. You should have your blood pressure measured twice--once when you are at a hospital or clinic, and once when you are not at a hospital or clinic. Record the average of the two measurements. To check your blood pressure when you are not at a hospital or clinic, you can use:  An automated blood pressure machine at a pharmacy.  A home blood pressure monitor.  If you are between 45 years and 39 years old, ask your health care provider if you should take aspirin to prevent strokes.  Have regular diabetes screenings. This involves taking a blood sample to check your fasting blood sugar level.  If you are at a normal weight and have a low risk for diabetes,  have this test once every three years after 68 years of age.  If you are overweight and have a high risk for diabetes, consider being tested at a younger age or more often. PREVENTING INFECTION  Hepatitis B  If you have a higher risk for hepatitis B, you should be screened for this virus. You are considered at high risk for hepatitis B if:  You were born in a country where hepatitis B is common. Ask your health care provider which countries are considered high risk.  Your parents were born in a high-risk country, and you have not been immunized against hepatitis B (hepatitis B vaccine).  You have HIV or AIDS.  You use needles to inject street drugs.  You live with someone who has hepatitis B.  You have had sex with someone who has hepatitis B.  You get hemodialysis treatment.  You take certain medicines for conditions, including cancer, organ transplantation, and autoimmune conditions. Hepatitis C  Blood testing is recommended for:  Everyone born from [birth year] through 1963.  Anyone with known risk factors for hepatitis C. Sexually transmitted infections (STIs)  You should be screened for sexually transmitted infections (STIs) including gonorrhea and chlamydia if:  You are sexually active and are younger than 68 years of age.  You are older than 68 years of age and your health care provider tells you that you are at risk for this type of infection.  Your sexual activity has changed since you were last screened and you are at an increased risk for chlamydia or gonorrhea. Ask your health care provider if you are at risk.  If you do not have HIV, but are at risk, it may be recommended that you take a prescription medicine daily to prevent HIV infection. This is called pre-exposure prophylaxis (PrEP). You are considered at risk if:  You are sexually active and do not regularly use condoms or know the HIV status of your partner(s).  You take drugs by injection.  You are sexually  active with a partner who has HIV. Talk with your health care provider about whether you are at high risk of being infected with HIV. If you choose to begin PrEP, you should first be tested for HIV. You should then be tested every 3 months for as long as you are taking PrEP.  PREGNANCY   If you are premenopausal and you may become pregnant, ask your health care provider about preconception counseling.  If you may  become pregnant, take 400 to 800 micrograms (mcg) of folic acid every day.  If you want to prevent pregnancy, talk to your health care provider about birth control (contraception). OSTEOPOROSIS AND MENOPAUSE   Osteoporosis is a disease in which the bones lose minerals and strength with aging. This can result in serious bone fractures. Your risk for osteoporosis can be identified using a bone density scan.  If you are 61 years of age or older, or if you are at risk for osteoporosis and fractures, ask your health care provider if you should be screened.  Ask your health care provider whether you should take a calcium or vitamin D supplement to lower your risk for osteoporosis.  Menopause may have certain physical symptoms and risks.  Hormone replacement therapy may reduce some of these symptoms and risks. Talk to your health care provider about whether hormone replacement therapy is right for you.  HOME CARE INSTRUCTIONS   Schedule regular health, dental, and eye exams.  Stay current with your immunizations.   Do not use any tobacco products including cigarettes, chewing tobacco, or electronic cigarettes.  If you are pregnant, do not drink alcohol.  If you are breastfeeding, limit how much and how often you drink alcohol.  Limit alcohol intake to no more than 1 drink per day for nonpregnant women. One drink equals 12 ounces of beer, 5 ounces of wine, or 1 ounces of hard liquor.  Do not use street drugs.  Do not share needles.  Ask your health care provider for help if  you need support or information about quitting drugs.  Tell your health care provider if you often feel depressed.  Tell your health care provider if you have ever been abused or do not feel safe at home.   This information is not intended to replace advice given to you by your health care provider. Make sure you discuss any questions you have with your health care provider.   Document Released: 10/24/2010 Document Revised: 05/01/2014 Document Reviewed: 03/12/2013 Elsevier Interactive Patient Education [employer].

## 2015-10-28 NOTE — Progress Notes (Signed)
 Subjective:    Patient ID: Kathryn Arnold, female    DOB: 08/12/1945, 70 y.o.   MRN: [id number]  Pt presents to the office today for chronic follow up.  Hypertension This is a chronic problem. The current episode started more than 1 year ago. The problem has been resolved since onset. The problem is controlled. Associated symptoms include anxiety. Pertinent negatives include no headaches, palpitations ("every now and then"), peripheral edema or shortness of breath. Risk factors for coronary artery disease include dyslipidemia, post-menopausal state and family history. Past treatments include beta blockers and calcium channel blockers. The current treatment provides mild improvement. There is no history of kidney disease, CAD/MI, CVA, heart failure or a thyroid problem. There is no history of sleep apnea.  Hyperlipidemia This is a chronic problem. The current episode started more than 1 year ago. The problem is controlled. Recent lipid tests were reviewed and are normal. She has no history of diabetes or hypothyroidism. Pertinent negatives include no leg pain, myalgias or shortness of breath. Current antihyperlipidemic treatment includes statins. The current treatment provides moderate improvement of lipids. Risk factors for coronary artery disease include dyslipidemia, family history, hypertension and post-menopausal.  Anxiety Presents for follow-up visit. The problem has been gradually improving. Patient reports no decreased concentration, depressed mood, excessive worry, irritability, nervous/anxious behavior, palpitations ("every now and then") or shortness of breath. Symptoms occur rarely. The severity of symptoms is moderate.   Her past medical history is significant for anxiety/panic attacks, asthma and depression. There is no history of CAD. Past treatments include nothing. Compliance with prior treatments has been good.  A Fib Pt currently taking xarelto 20 mg and Cardizem 120 mg. Pt has  Cardizem 60 mg and flecainide 150 mg  as needed. Pt is followed by Cardiologists every 6 months.  Osteoporosis Pt currently taking fosamax 70 mg weekly. Last Dexascan was 02/23/2015 Herpes Simplex 2 Pt currently taking valtrex 500 mg daily. PT states it has been 15 years since her last breakout.   Review of Systems  Constitutional: Negative.  Negative for irritability.  HENT: Negative.   Eyes: Negative.   Respiratory: Negative.  Negative for shortness of breath.   Cardiovascular: Negative for palpitations ("every now and then").  Gastrointestinal: Negative.   Endocrine: Negative.   Genitourinary: Negative.   Musculoskeletal: Negative.  Negative for myalgias.  Neurological: Negative.  Negative for headaches.  Hematological: Negative.   Psychiatric/Behavioral: Negative.  Negative for decreased concentration. The patient is not nervous/anxious.   All other systems reviewed and are negative.      Objective:   Physical Exam  Constitutional: She is oriented to person, place, and time. She appears well-developed and well-nourished. No distress.  HENT:  Head: Normocephalic and atraumatic.  Right Ear: External ear normal.  Left Ear: External ear normal.  Nose: Nose normal.  Mouth/Throat: Oropharynx is clear and moist.  Eyes: Pupils are equal, round, and reactive to light.  Neck: Normal range of motion. Neck supple. No thyromegaly present.  Cardiovascular: Normal rate, regular rhythm, normal heart sounds and intact distal pulses.   No murmur heard. Pulmonary/Chest: Effort normal and breath sounds normal. No respiratory distress. She has no wheezes.  Abdominal: Soft. Bowel sounds are normal. She exhibits no distension. There is no tenderness.  Musculoskeletal: Normal range of motion. She exhibits no edema or tenderness.  Neurological: She is alert and oriented to person, place, and time. She has normal reflexes. No cranial nerve deficit.  Skin: Skin is warm and dry.     Subjective:    Patient ID: Kathryn Arnold, female    DOB: 08/12/1945, 70 y.o.   MRN: [id number]  Pt presents to the office today for chronic follow up.  Hypertension This is a chronic problem. The current episode started more than 1 year ago. The problem has been resolved since onset. The problem is controlled. Associated symptoms include anxiety. Pertinent negatives include no headaches, palpitations ("every now and then"), peripheral edema or shortness of breath. Risk factors for coronary artery disease include dyslipidemia, post-menopausal state and family history. Past treatments include beta blockers and calcium channel blockers. The current treatment provides mild improvement. There is no history of kidney disease, CAD/MI, CVA, heart failure or a thyroid problem. There is no history of sleep apnea.  Hyperlipidemia This is a chronic problem. The current episode started more than 1 year ago. The problem is controlled. Recent lipid tests were reviewed and are normal. She has no history of diabetes or hypothyroidism. Pertinent negatives include no leg pain, myalgias or shortness of breath. Current antihyperlipidemic treatment includes statins. The current treatment provides moderate improvement of lipids. Risk factors for coronary artery disease include dyslipidemia, family history, hypertension and post-menopausal.  Anxiety Presents for follow-up visit. The problem has been gradually improving. Patient reports no decreased concentration, depressed mood, excessive worry, irritability, nervous/anxious behavior, palpitations ("every now and then") or shortness of breath. Symptoms occur rarely. The severity of symptoms is moderate.   Her past medical history is significant for anxiety/panic attacks, asthma and depression. There is no history of CAD. Past treatments include nothing. Compliance with prior treatments has been good.  A Fib Pt currently taking xarelto 20 mg and Cardizem 120 mg. Pt has  Cardizem 60 mg and flecainide 150 mg  as needed. Pt is followed by Cardiologists every 6 months.  Osteoporosis Pt currently taking fosamax 70 mg weekly. Last Dexascan was 02/23/2015 Herpes Simplex 2 Pt currently taking valtrex 500 mg daily. PT states it has been 15 years since her last breakout.   Review of Systems  Constitutional: Negative.  Negative for irritability.  HENT: Negative.   Eyes: Negative.   Respiratory: Negative.  Negative for shortness of breath.   Cardiovascular: Negative for palpitations ("every now and then").  Gastrointestinal: Negative.   Endocrine: Negative.   Genitourinary: Negative.   Musculoskeletal: Negative.  Negative for myalgias.  Neurological: Negative.  Negative for headaches.  Hematological: Negative.   Psychiatric/Behavioral: Negative.  Negative for decreased concentration. The patient is not nervous/anxious.   All other systems reviewed and are negative.      Objective:   Physical Exam  Constitutional: She is oriented to person, place, and time. She appears well-developed and well-nourished. No distress.  HENT:  Head: Normocephalic and atraumatic.  Right Ear: External ear normal.  Left Ear: External ear normal.  Nose: Nose normal.  Mouth/Throat: Oropharynx is clear and moist.  Eyes: Pupils are equal, round, and reactive to light.  Neck: Normal range of motion. Neck supple. No thyromegaly present.  Cardiovascular: Normal rate, regular rhythm, normal heart sounds and intact distal pulses.   No murmur heard. Pulmonary/Chest: Effort normal and breath sounds normal. No respiratory distress. She has no wheezes.  Abdominal: Soft. Bowel sounds are normal. She exhibits no distension. There is no tenderness.  Musculoskeletal: Normal range of motion. She exhibits no edema or tenderness.  Neurological: She is alert and oriented to person, place, and time. She has normal reflexes. No cranial nerve deficit.  Skin: Skin is warm and dry.  

## 2015-10-29 LAB — CMP14+EGFR
ALK PHOS: 43 IU/L (ref 39–117)
ALT: 20 IU/L (ref 0–32)
AST: 23 IU/L (ref 0–40)
Albumin/Globulin Ratio: 1.7 (ref 1.2–2.2)
Albumin: 4.4 g/dL (ref 3.6–4.8)
BILIRUBIN TOTAL: 0.4 mg/dL (ref 0.0–1.2)
BUN/Creatinine Ratio: 31 — ABNORMAL HIGH (ref 12–28)
BUN: 24 mg/dL (ref 8–27)
CHLORIDE: 103 mmol/L (ref 96–106)
CO2: 22 mmol/L (ref 18–29)
Calcium: 9.3 mg/dL (ref 8.7–10.3)
Creatinine, Ser: 0.78 mg/dL (ref 0.57–1.00)
GFR calc Af Amer: 91 mL/min/{1.73_m2} (ref >59)
GFR calc non Af Amer: 79 mL/min/{1.73_m2} (ref >59)
GLUCOSE: 105 mg/dL — AB (ref 65–99)
Globulin, Total: 2.6 g/dL (ref 1.5–4.5)
Potassium: 4.5 mmol/L (ref 3.5–5.2)
Sodium: 142 mmol/L (ref 134–144)
TOTAL PROTEIN: 7 g/dL (ref 6.0–8.5)

## 2015-10-29 LAB — LIPID PANEL
CHOLESTEROL TOTAL: 159 mg/dL (ref 100–199)
Chol/HDL Ratio: 3.1 ratio units (ref 0.0–4.4)
HDL: 51 mg/dL (ref >39)
LDL CALC: 82 mg/dL (ref 0–99)
TRIGLYCERIDES: 128 mg/dL (ref 0–149)
VLDL CHOLESTEROL CAL: 26 mg/dL (ref 5–40)

## 2015-11-01 ENCOUNTER — Other Ambulatory Visit: Payer: Self-pay | Admitting: *Deleted

## 2015-11-01 MED ORDER — ALENDRONATE SODIUM 70 MG PO TABS: 70.0000 mg | ORAL_TABLET | ORAL | Status: DC

## 2015-11-05 ENCOUNTER — Telehealth: Payer: Self-pay | Admitting: Family

## 2015-11-05 NOTE — Telephone Encounter (Signed)
Spoke with pt and Ins Cardizem LA is covered as Tier 3 Pt is ok with this No change in med needed

## 2015-11-17 ENCOUNTER — Encounter: Payer: Self-pay | Admitting: Family Medicine

## 2015-11-17 ENCOUNTER — Telehealth: Payer: Self-pay | Admitting: Family

## 2015-11-17 ENCOUNTER — Ambulatory Visit (INDEPENDENT_AMBULATORY_CARE_PROVIDER_SITE_OTHER): Payer: 59 | Admitting: Family Medicine

## 2015-11-17 VITALS — BP 128/78 | HR 62 | Temp 97.2°F | Ht 65.0 in | Wt 152.4 lb

## 2015-11-17 DIAGNOSIS — H00012 Hordeolum externum right lower eyelid: Secondary | ICD-10-CM | POA: Diagnosis not present

## 2015-11-17 NOTE — Progress Notes (Signed)
BP 128/78 (BP Location: Left Arm, Patient Position: Sitting, Cuff Size: Normal)   Pulse 62   Temp 97.2 F (36.2 C) (Oral)   Ht  (1.651 m)   Wt 152 lb 6.4 oz (69.1 kg)   BMI 25.36 kg/m    Subjective:    Patient ID: Kathryn Arnold, female    DOB: 07/18/1945, 70 y.o.   MRN: [id number]  HPI: Kathryn Arnold is a 70 y.o. female presenting on 11/17/2015 for Eye Drainage (swollen, pussy, started yesterday)   HPI Swollen drainage from eye Patient has been having swelling and drainage from her lower eyelid that's been going on for the past couple days. She is going on trips that she just wanted to get checked out to make sure she wasn't getting something worse. She denies any fevers or chills or pain with eye movement or blurred vision. The drainage has been yellow out of that lower eyelid. She has been using warm compresses and feels like it is improving.  Relevant past medical, surgical, family and social history reviewed and updated as indicated. Interim medical history since our last visit reviewed. Allergies and medications reviewed and updated.  Review of Systems  Constitutional: Negative for chills and fever.  HENT: Negative for congestion, ear discharge and ear pain.   Eyes: Negative for redness and visual disturbance.  Respiratory: Negative for chest tightness and shortness of breath.   Cardiovascular: Negative for chest pain and leg swelling.  Genitourinary: Negative for difficulty urinating and dysuria.  Musculoskeletal: Negative for back pain and gait problem.  Skin: Positive for color change. Negative for rash.  Neurological: Negative for light-headedness and headaches.  Psychiatric/Behavioral: Negative for agitation and behavioral problems.  All other systems reviewed and are negative.   Per HPI unless specifically indicated above     Medication List       Accurate as of 11/17/15  5:03 PM. Always use your most recent med list.          alendronate 70 MG  tablet Commonly known as:  FOSAMAX Take 1 tablet (70 mg total) by mouth every 7 (seven) days. Take with a full glass of water on an empty stomach.   CALCIUM 600 PO Take 1 tablet by mouth 2 (two) times daily.   CENTRUM SILVER ULTRA WOMENS PO Take 1 capsule by mouth daily.   Co Q 10 100 MG Caps Take 1 capsule by mouth daily.   diltiazem 120 MG 24 hr tablet Commonly known as:  CARDIZEM LA Take 1 tablet (120 mg total) by mouth daily.   diltiazem 60 MG tablet Commonly known as:  CARDIZEM Take 1/2 to 1 tablet every 4 hours AS NEEDED for heart rate >100 as long as blood pressure >100   flecainide 150 MG tablet Commonly known as:  TAMBOCOR Take 1 tablet by mouth as needed for afib   Magnesium 250 MG Tabs Take 2 tablets by mouth daily.   rivaroxaban 20 MG Tabs tablet Commonly known as:  XARELTO Take 1 tablet (20 mg total) by mouth daily with supper.   rosuvastatin 20 MG tablet Commonly known as:  CRESTOR Take 1 tablet by mouth  daily   valACYclovir 500 MG tablet Commonly known as:  VALTREX Take 1 tablet by mouth  daily   vitamin C 500 MG tablet Commonly known as:  ASCORBIC ACID Take 500 mg by mouth 2 (two) times daily.          Objective:    BP 128/78 (  BP Location: Left Arm, Patient Position: Sitting, Cuff Size: Normal)   Pulse 62   Temp 97.2 F (36.2 C) (Oral)   Ht 5\' 5"  (1.651 m)   Wt 152 lb 6.4 oz (69.1 kg)   BMI 25.36 kg/m   Wt Readings from Last 3 Encounters:  11/17/15 152 lb 6.4 oz (69.1 kg)  10/28/15 151 lb 9.6 oz (68.8 kg)  08/11/15 149 lb 9.6 oz (67.9 kg)    Physical Exam  Constitutional: She is oriented to person, place, and time. She appears well-developed and well-nourished. No distress.  Eyes: Conjunctivae and EOM are normal. Pupils are equal, round, and reactive to light. Right eye exhibits hordeolum.    Cardiovascular: Normal rate, regular rhythm, normal heart sounds and intact distal pulses.   No murmur heard. Pulmonary/Chest: Effort normal  and breath sounds normal. No respiratory distress. She has no wheezes.  Musculoskeletal: Normal range of motion. She exhibits no edema or tenderness.  Neurological: She is alert and oriented to person, place, and time. Coordination normal.  Skin: Skin is warm and dry. No rash noted. She is not diaphoretic.  Psychiatric: She has a normal mood and affect. Her behavior is normal.  Nursing note and vitals reviewed.       Assessment & Plan:   Problem List Items Addressed This Visit    None    Visit Diagnoses    Hordeolum externum of right lower eyelid    -  Primary   Continue warm compresses and if worsens give [place] a call.       Follow up plan: Return if symptoms worsen or fail to improve.  Counseling provided for all of the vaccine components No orders of the defined types were placed in this encounter.   Arville Care, MD [facility] 11/17/2015, 5:03 PM

## 2015-11-17 NOTE — Telephone Encounter (Signed)
Appointment given for this evening with [place].

## 2015-12-06 ENCOUNTER — Encounter (HOSPITAL_COMMUNITY): Payer: Self-pay | Admitting: Nurse Practitioner

## 2015-12-06 ENCOUNTER — Ambulatory Visit (HOSPITAL_COMMUNITY)
Admission: RE | Admit: 2015-12-06 | Discharge: 2015-12-06 | Disposition: A | Discharge status: Home / Self Care | Payer: 59 | Source: Ambulatory Visit | Attending: Nurse Practitioner | Admitting: Nurse Practitioner

## 2015-12-06 VITALS — BP 134/80 | HR 60 | Ht 65.0 in | Wt 153.4 lb

## 2015-12-06 DIAGNOSIS — E7800 Pure hypercholesterolemia, unspecified: Secondary | ICD-10-CM | POA: Insufficient documentation

## 2015-12-06 DIAGNOSIS — Z8262 Family history of osteoporosis: Secondary | ICD-10-CM | POA: Insufficient documentation

## 2015-12-06 DIAGNOSIS — Z8679 Personal history of other diseases of the circulatory system: Secondary | ICD-10-CM | POA: Diagnosis not present

## 2015-12-06 DIAGNOSIS — I4891 Unspecified atrial fibrillation: Secondary | ICD-10-CM | POA: Diagnosis not present

## 2015-12-06 DIAGNOSIS — Z79899 Other long term (current) drug therapy: Secondary | ICD-10-CM | POA: Insufficient documentation

## 2015-12-06 DIAGNOSIS — Z8249 Family history of ischemic heart disease and other diseases of the circulatory system: Secondary | ICD-10-CM | POA: Diagnosis not present

## 2015-12-06 DIAGNOSIS — Z7983 Long term (current) use of bisphosphonates: Secondary | ICD-10-CM | POA: Insufficient documentation

## 2015-12-06 DIAGNOSIS — Z7901 Long term (current) use of anticoagulants: Secondary | ICD-10-CM | POA: Insufficient documentation

## 2015-12-06 DIAGNOSIS — Z9889 Other specified postprocedural states: Secondary | ICD-10-CM | POA: Diagnosis not present

## 2015-12-06 DIAGNOSIS — E559 Vitamin D deficiency, unspecified: Secondary | ICD-10-CM | POA: Diagnosis not present

## 2015-12-06 DIAGNOSIS — M810 Age-related osteoporosis without current pathological fracture: Secondary | ICD-10-CM | POA: Diagnosis not present

## 2015-12-06 NOTE — Progress Notes (Signed)
Patient ID: Kathryn Arnold, female   DOB: 26-Apr-1945, 70 y.o.   MRN: [id number]     Primary Care Physician: Jannifer Rodneyhristy Hawks, FNP Ref[facility] Phys[facility]   Kathryn Arnold is a 70 y.o. female with a h/o afib s/p PVI and aflutter abaltion 11/03/14 for f/u in the afib clinic. visit this. She had been doing well maintaining SR.  She is off flecainide, but does have 150 mg to use as needed for breakthrough afib. She has not had any afib episodes in over one year.  She watches her weight carefully and is normal weight, no tobacco/alcohol/ caffeine use.  Today, she denies symptoms of palpitations, chest pain, shortness of breath, orthopnea, PND, lower extremity edema, dizziness, presyncope, syncope, or neurologic sequela. The patient is tolerating medications without difficulties and is otherwise without complaint today.   Past Medical History:  Diagnosis Date  . Abnormal Pap smear   . Complication of anesthesia    alittle slow to wake up-stayed drowsy  . 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 HSV-2 (herpes simplex virus 2) infection   . Hypercholesteremia   . Hyperlipidemia with target LDL less than 100   . Osteoporosis, unspecified    Last DEXA 10/2011   . Paroxysmal atrial fibrillation (HCC)    s/p ablation Afib and flutter 11/03/14  . Vitamin D deficiency    Past Surgical History:  Procedure Laterality Date  . BREAST LUMPECTOMY    . DILATION AND CURETTAGE OF UTERUS    . ELECTROPHYSIOLOGIC STUDY N/A 11/03/2014   Procedure: Atrial Fibrillation Ablation;  Surgeon: Hillis RangeJames Allred, MD;  [facility];  Service: Cardiovascular;  Laterality: N/A;  . LAPAROSCOPY ABDOMEN DIAGNOSTIC      Current Outpatient Prescriptions  Medication Sig Dispense Refill  . alendronate (FOSAMAX) 70 MG tablet Take 1 tablet (70 mg total) by mouth every 7 (seven) days. Take with a full glass of water on an empty stomach. 12 tablet 3  . Calcium Carbonate (CALCIUM 600 PO) Take 1 tablet by mouth 2  (two) times daily.    . Coenzyme Q10 (CO Q 10) 100 MG CAPS Take 1 capsule by mouth daily.    [name]. diltiazem (CARDIZEM LA) 120 MG 24 hr tablet Take 1 tablet (120 mg total) by mouth daily. 90 tablet 2  . flecainide (TAMBOCOR) 150 MG tablet Take 1 tablet by mouth as needed for afib 20 tablet 1  . Magnesium 250 MG TABS Take 2 tablets by mouth daily.    . Multiple Vitamins-Minerals (CENTRUM SILVER ULTRA WOMENS PO) Take 1 capsule by mouth daily.     . rivaroxaban (XARELTO) 20 MG TABS tablet Take 1 tablet (20 mg total) by mouth daily with supper. 90 tablet 3  . rosuvastatin (CRESTOR) 20 MG tablet Take 1 tablet by mouth  daily 90 tablet 2  . valACYclovir (VALTREX) 500 MG tablet Take 1 tablet by mouth  daily 90 tablet 1  . vitamin C (ASCORBIC ACID) 500 MG tablet Take 500 mg by mouth 2 (two) times daily.     [name]. diltiazem (CARDIZEM) 60 MG tablet Take 1/2 to 1 tablet every 4 hours AS NEEDED for heart rate >100 as long as blood pressure >100 (Patient not taking: Reported on 12/06/2015) 45 tablet 1   No current facility-administered medications for this encounter.     No Known Allergies  Social History   Social History  . Marital status: Married    Spouse name: N/A  . Number of children:  N/A  . Years of education: N/A   Occupational History  . Not on file.   Social History Main Topics  . Smoking status: Never Smoker  . Smokeless tobacco: Never Used  . Alcohol use No  . Drug use: No  . Sexual activity: Yes    Birth control/ protection: None   Other Topics Concern  . Not on file   Social History Narrative   [place] in [place].    Family History  Problem Relation Age of Onset  . Osteoporosis Mother   . Cancer Mother     breast  . Hypertension Mother   . Heart disease Father   . Other Neg Hx     ROS- All systems are reviewed and negative except as per the HPI above  Physical Exam: Vitals:   12/06/15 1009  BP: 134/80  Pulse: 60  Weight: 153 lb 6.4 oz (69.6 kg)  Height:  5\' 5"  (1.651 m)    GEN- The patient is well appearing, alert and oriented x 3 today.   Head- normocephalic, atraumatic Eyes-  Sclera clear, conjunctiva pink Ears- hearing intact Oropharynx- clear Neck- supple, no JVP Lymph- no cervical lymphadenopathy Lungs- Clear to ausculation bilaterally, normal work of breathing Heart- Regular rate and rhythm, no murmurs, rubs or gallops, PMI not laterally displaced GI- soft, NT, ND, + BS Extremities- no clubbing, cyanosis, or edema MS- no significant deformity or atrophy Skin- no rash or lesion Psych- euthymic mood, full affect Neuro- strength and sensation are intact  EKG-SR with IRBBB at 61 bpm, Pr int 150 ms, QRS 94 ms, QTc 426 ms.  Epic records reviewed  Assessment and Plan: 1. Afib Doing well s/p ablation in SR  Continue xarelto We discussed how to use short acting Cardizem/flecainide if afib should return   F/u afib clinic in 9 months   Lupita LeashDonna C. Matthew Folksarroll, ANP-C [facility] [address] [place], [place] [id number] [phone]

## 2015-12-13 ENCOUNTER — Telehealth: Payer: Self-pay | Admitting: Family

## 2015-12-13 DIAGNOSIS — B009 Herpesviral infection, unspecified: Secondary | ICD-10-CM

## 2015-12-13 DIAGNOSIS — E785 Hyperlipidemia, unspecified: Secondary | ICD-10-CM

## 2015-12-13 MED ORDER — RIVAROXABAN 20 MG PO TABS: ORAL_TABLET | ORAL | 1 refills | Status: DC

## 2015-12-13 MED ORDER — ROSUVASTATIN CALCIUM 20 MG PO TABS: ORAL_TABLET | ORAL | 1 refills | Status: DC

## 2015-12-13 MED ORDER — VALACYCLOVIR HCL 500 MG PO TABS: ORAL_TABLET | ORAL | 1 refills | Status: DC

## 2015-12-13 NOTE — Telephone Encounter (Signed)
meds sent to pharm

## 2016-01-31 ENCOUNTER — Ambulatory Visit: Payer: 59

## 2016-02-01 ENCOUNTER — Ambulatory Visit (INDEPENDENT_AMBULATORY_CARE_PROVIDER_SITE_OTHER): Payer: 59

## 2016-02-01 DIAGNOSIS — Z23 Encounter for immunization: Secondary | ICD-10-CM

## 2016-03-08 ENCOUNTER — Telehealth: Payer: Self-pay | Admitting: Family

## 2016-03-08 NOTE — Telephone Encounter (Signed)
She believes fosamax is causing her hands and feet to swell.  It is a side effect of this medication.  She plans to stop taking this and wait for a month to see if symptoms resolve.  If no improvement she will see doctor for assessment and further plans.

## 2016-03-10 ENCOUNTER — Ambulatory Visit (INDEPENDENT_AMBULATORY_CARE_PROVIDER_SITE_OTHER): Payer: 59 | Admitting: Family

## 2016-03-10 ENCOUNTER — Encounter (INDEPENDENT_AMBULATORY_CARE_PROVIDER_SITE_OTHER): Payer: Self-pay

## 2016-03-10 ENCOUNTER — Encounter: Payer: Self-pay | Admitting: Family

## 2016-03-10 VITALS — BP 131/78 | HR 63 | Temp 98.3°F | Ht 65.0 in | Wt 156.6 lb

## 2016-03-10 DIAGNOSIS — M25541 Pain in joints of right hand: Secondary | ICD-10-CM | POA: Diagnosis not present

## 2016-03-10 DIAGNOSIS — M25542 Pain in joints of left hand: Secondary | ICD-10-CM | POA: Diagnosis not present

## 2016-03-10 MED ORDER — MELOXICAM 7.5 MG PO TABS: 7.5000 mg | ORAL_TABLET | Freq: Every day | ORAL | 0 refills | Status: DC

## 2016-03-10 NOTE — Patient Instructions (Signed)
Arthritis Introduction Arthritis is a term that is commonly used to refer to joint pain or joint disease. There are more than 100 types of arthritis. What are the causes? The most common cause of this condition is wear and tear of a joint. Other causes include:  Gout.  Inflammation of a joint.  An infection of a joint.  Sprains and other injuries near the joint.  A drug reaction or allergic reaction. In some cases, the cause may not be known. What are the signs or symptoms? The main symptom of this condition is pain in the joint with movement. Other symptoms include:  Redness, swelling, or stiffness at a joint.  Warmth coming from the joint.  Fever.  Overall feeling of illness. How is this diagnosed? This condition may be diagnosed with a physical exam and tests, including:  Blood tests.  Urine tests.  Imaging tests, such as MRI, X-rays, or a CT scan. Sometimes, fluid is removed from a joint for testing. How is this treated? Treatment for this condition may involve:  Treatment of the cause, if it is known.  Rest.  Raising (elevating) the joint.  Applying cold or hot packs to the joint.  Medicines to improve symptoms and reduce inflammation.  Injections of a steroid such as cortisone into the joint to help reduce pain and inflammation. Depending on the cause of your arthritis, you may need to make lifestyle changes to reduce stress on your joint. These changes may include exercising more and losing weight. Follow these instructions at home: Medicines  Take over-the-counter and prescription medicines only as told by your health care provider.  Do not take aspirin to relieve pain if gout is suspected. Activity  Rest your joint if told by your health care provider. Rest is important when your disease is active and your joint feels painful, swollen, or stiff.  Avoid activities that make the pain worse. It is important to balance activity with rest.  Exercise  your joint regularly with range-of-motion exercises as told by your health care provider. Try doing low-impact exercise, such as:  Swimming.  Water aerobics.  Biking.  Walking. Joint Care   If your joint is swollen, keep it elevated if told by your health care provider.  If your joint feels stiff in the morning, try taking a warm shower.  If directed, apply heat to the joint. If you have diabetes, do not apply heat without permission from your health care provider.  Put a towel between the joint and the hot pack or heating pad.  Leave the heat on the area for 20-30 minutes.  If directed, apply ice to the joint:  Put ice in a plastic bag.  Place a towel between your skin and the bag.  Leave the ice on for 20 minutes, 2-3 times per day.  Keep all follow-up visits as told by your health care provider. This is important. Contact a health care provider if:  The pain gets worse.  You have a fever. Get help right away if:  You develop severe joint pain, swelling, or redness.  Many joints become painful and swollen.  You develop severe back pain.  You develop severe weakness in your leg.  You cannot control your bladder or bowels. This information is not intended to replace advice given to you by your health care provider. Make sure you discuss any questions you have with your health care provider. Document Released: 05/18/2004 Document Revised: 09/16/2015 Document Reviewed: 07/06/2014  2017 Elsevier  

## 2016-03-10 NOTE — Progress Notes (Signed)
   Subjective:    Patient ID: Kathryn Arnold, female    DOB: July 11, 1945, 70 y.o.   MRN: [id number]  Pt presents to the office today with pain in the joints of her fingers that started a few months ago, but has become worse. Pt has not taken any medications for this.  Arthritis  Presents for initial visit. The disease course has been worsening. She complains of pain, stiffness and joint swelling. Affected locations include the right MCP and left MCP. Her pain is at a severity of 5/10. Associated symptoms include pain at night. Pertinent negatives include no dry eyes. Her past medical history is significant for osteoarthritis. Past treatments include rest. The treatment provided mild relief.      Review of Systems  Musculoskeletal: Positive for arthritis, joint swelling and stiffness.  All other systems reviewed and are negative.      Objective:   Physical Exam  Constitutional: She is oriented to person, place, and time. She appears well-developed and well-nourished.  Cardiovascular: Normal rate, regular rhythm, normal heart sounds and intact distal pulses.   Pulmonary/Chest: Effort normal and breath sounds normal.  Musculoskeletal: She exhibits edema and tenderness.  Tenderness and swelling present in bilateral joints of hands  Neurological: She is alert and oriented to person, place, and time.  Skin: Skin is warm and dry.  Psychiatric: She has a normal mood and affect. Her behavior is normal. Judgment and thought content normal.      BP 131/78   Pulse 63   Temp 98.3 F (36.8 C) (Oral)   Ht _0  (1.651 m)   Wt 156 lb 9.6 oz (71 kg)   BMI 26.06 kg/m      Assessment & Plan:  1. Arthralgia of both hands -Rest -Ice and heat as needed -ROM exercises - Discussed increase risk of bleeding with mobic and xarelto- Stop mobic if any bleeding occurs -RTO 1-2 months - Arthritis Panel - CMP14+EGFR - meloxicam (MOBIC) 7.5 MG tablet; Take 1 tablet (7.5 mg total) by mouth daily.   Dispense: 30 tablet; Refill: 0  Evelina Dun, FNP

## 2016-03-11 LAB — ARTHRITIS PANEL
BASOS: 1 %
Basophils Absolute: 0 10*3/uL (ref 0.0–0.2)
EOS (ABSOLUTE): 0.1 10*3/uL (ref 0.0–0.4)
Eos: 2 %
HEMOGLOBIN: 14.2 g/dL (ref 11.1–15.9)
Hematocrit: 41 % (ref 34.0–46.6)
IMMATURE GRANS (ABS): 0 10*3/uL (ref 0.0–0.1)
Immature Granulocytes: 0 %
LYMPHS: 33 %
Lymphocytes Absolute: 1.4 10*3/uL (ref 0.7–3.1)
MCH: 30.7 pg (ref 26.6–33.0)
MCHC: 34.6 g/dL (ref 31.5–35.7)
MCV: 89 fL (ref 79–97)
MONOS ABS: 0.3 10*3/uL (ref 0.1–0.9)
Monocytes: 7 %
NEUTROS ABS: 2.4 10*3/uL (ref 1.4–7.0)
Neutrophils: 57 %
Platelets: 263 10*3/uL (ref 150–379)
RBC: 4.62 x10E6/uL (ref 3.77–5.28)
RDW: 12.8 % (ref 12.3–15.4)
Sed Rate: 2 mm/hr (ref 0–40)
URIC ACID: 3.8 mg/dL (ref 2.5–7.1)
WBC: 4.2 10*3/uL (ref 3.4–10.8)

## 2016-03-11 LAB — CMP14+EGFR
A/G RATIO: 2.1 (ref 1.2–2.2)
ALBUMIN: 4.6 g/dL (ref 3.6–4.8)
ALT: 19 IU/L (ref 0–32)
AST: 27 IU/L (ref 0–40)
Alkaline Phosphatase: 47 IU/L (ref 39–117)
BUN / CREAT RATIO: 18 (ref 12–28)
BUN: 15 mg/dL (ref 8–27)
Bilirubin Total: 0.4 mg/dL (ref 0.0–1.2)
CALCIUM: 10 mg/dL (ref 8.7–10.3)
CO2: 24 mmol/L (ref 18–29)
Chloride: 101 mmol/L (ref 96–106)
Creatinine, Ser: 0.82 mg/dL (ref 0.57–1.00)
GFR, EST AFRICAN AMERICAN: 85 mL/min/{1.73_m2} (ref >59)
GFR, EST NON AFRICAN AMERICAN: 74 mL/min/{1.73_m2} (ref >59)
GLOBULIN, TOTAL: 2.2 g/dL (ref 1.5–4.5)
Glucose: 101 mg/dL — ABNORMAL HIGH (ref 65–99)
POTASSIUM: 5 mmol/L (ref 3.5–5.2)
SODIUM: 141 mmol/L (ref 134–144)
Total Protein: 6.8 g/dL (ref 6.0–8.5)

## 2016-03-28 ENCOUNTER — Telehealth: Payer: Self-pay | Admitting: Family

## 2016-03-28 DIAGNOSIS — B009 Herpesviral infection, unspecified: Secondary | ICD-10-CM

## 2016-03-28 MED ORDER — VALACYCLOVIR HCL 500 MG PO TABS: ORAL_TABLET | ORAL | 0 refills | Status: DC

## 2016-03-28 NOTE — Telephone Encounter (Signed)
refilled #[phone] till her mail order is delivered

## 2016-04-23 ENCOUNTER — Encounter (HOSPITAL_COMMUNITY): Payer: Self-pay | Admitting: Emergency Medicine

## 2016-04-23 ENCOUNTER — Emergency Department (HOSPITAL_COMMUNITY)
Admission: EM | Admit: 2016-04-23 | Discharge: 2016-04-23 | Disposition: A | Discharge status: Home / Self Care | Payer: 59 | Attending: Emergency Medicine | Admitting: Emergency Medicine

## 2016-04-23 DIAGNOSIS — Z7901 Long term (current) use of anticoagulants: Secondary | ICD-10-CM | POA: Diagnosis not present

## 2016-04-23 DIAGNOSIS — I10 Essential (primary) hypertension: Secondary | ICD-10-CM | POA: Insufficient documentation

## 2016-04-23 DIAGNOSIS — I4891 Unspecified atrial fibrillation: Secondary | ICD-10-CM | POA: Diagnosis not present

## 2016-04-23 LAB — CBC WITH DIFFERENTIAL/PLATELET
BASOS ABS: 0 10*3/uL (ref 0.0–0.1)
Basophils Relative: 1 %
EOS ABS: 0.2 10*3/uL (ref 0.0–0.7)
Eosinophils Relative: 3 %
HCT: 45 % (ref 36.0–46.0)
Hemoglobin: 15.7 g/dL — ABNORMAL HIGH (ref 12.0–15.0)
LYMPHS ABS: 2.3 10*3/uL (ref 0.7–4.0)
Lymphocytes Relative: 35 %
MCH: 30.4 pg (ref 26.0–34.0)
MCHC: 34.9 g/dL (ref 30.0–36.0)
MCV: 87 fL (ref 78.0–100.0)
MONOS PCT: 8 %
Monocytes Absolute: 0.5 10*3/uL (ref 0.1–1.0)
NEUTROS ABS: 3.5 10*3/uL (ref 1.7–7.7)
NEUTROS PCT: 53 %
PLATELETS: 236 10*3/uL (ref 150–400)
RBC: 5.17 MIL/uL — ABNORMAL HIGH (ref 3.87–5.11)
RDW: 11.9 % (ref 11.5–15.5)
WBC: 6.4 10*3/uL (ref 4.0–10.5)

## 2016-04-23 LAB — I-STAT CHEM 8, ED
BUN: 17 mg/dL (ref 6–20)
CALCIUM ION: 1.13 mmol/L — AB (ref 1.15–1.40)
Chloride: 106 mmol/L (ref 101–111)
Creatinine, Ser: 0.7 mg/dL (ref 0.44–1.00)
GLUCOSE: 116 mg/dL — AB (ref 65–99)
HCT: 44 % (ref 36.0–46.0)
HEMOGLOBIN: 15 g/dL (ref 12.0–15.0)
POTASSIUM: 3.9 mmol/L (ref 3.5–5.1)
Sodium: 143 mmol/L (ref 135–145)
TCO2: 26 mmol/L (ref 0–100)

## 2016-04-23 LAB — I-STAT TROPONIN, ED: TROPONIN I, POC: 0.01 ng/mL (ref 0.00–0.08)

## 2016-04-23 MED ORDER — ETOMIDATE 2 MG/ML IV SOLN
7.5000 mg | Freq: Once | INTRAVENOUS | Status: AC
  Administered 2016-04-23: 7.5 mg via INTRAVENOUS
  Filled 2016-04-23: qty 10

## 2016-04-23 NOTE — ED Provider Notes (Signed)
[facility] DEPT Provider Note   CSN: [id number] Arrival date & time: 04/23/16  2128     History   Chief Complaint Chief Complaint  Patient presents with  . Atrial Fibrillation    HPI Kathryn Arnold is a 70 y.o. female.  HPI Patient resents with A. fib with RVR. History of same. Had ablation over a year ago has had only one episode of it since. She has home Cardizem and flecainide to take when necessary. She took Cardizem after it started around 8:00 tonight. Did not take her flecainide. Found to be in A. fib with a heart rate around 140. She's been doing well otherwise. She states she was in bed getting ready to watch some TV and go to bed. No fevers or chills. No swelling or legs. States she urinates more frequently when she is in A. fib.   Past Medical History:  Diagnosis Date  . Abnormal Pap smear   . Complication of anesthesia    alittle slow to wake up-stayed drowsy  . 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 HSV-2 (herpes simplex virus 2) infection   . Hypercholesteremia   . Hyperlipidemia with target LDL less than 100   . Osteoporosis, unspecified    Last DEXA 10/2011   . Paroxysmal atrial fibrillation (HCC)    s/p ablation Afib and flutter 11/03/14  . Vitamin D deficiency     Patient Active Problem List   Diagnosis Date Noted  . GAD (generalized anxiety disorder) 01/28/2014  . Essential hypertension, benign 01/20/2014  . Atrial fibrillation (HCC) 12/31/2013  . HSV-2 (herpes simplex virus 2) infection 12/18/2013  . Vitamin D deficiency 12/18/2013  . Hyperlipidemia with target LDL less than 100 08/28/2012  . Osteoporosis 08/28/2012    Past Surgical History:  Procedure Laterality Date  . BREAST LUMPECTOMY    . DILATION AND CURETTAGE OF UTERUS    . ELECTROPHYSIOLOGIC STUDY N/A 11/03/2014   Procedure: Atrial Fibrillation Ablation;  Surgeon: Hillis RangeJames Allred, MD;  [facility];  Service: Cardiovascular;  Laterality: N/A;  . LAPAROSCOPY  ABDOMEN DIAGNOSTIC      OB History    Gravida Para Term Preterm AB Living   2 2 2  0 0 2   SAB TAB Ectopic Multiple Live Births   0 0 0 0         Home Medications    Prior to Admission medications   Medication Sig Start Date End Date Taking? Authorizing Provider  Calcium Carbonate (CALCIUM 600 PO) Take 1 tablet by mouth 2 (two) times daily.   Yes Historical Provider, MD  Coenzyme Q10 (CO Q 10) 100 MG CAPS Take 1 capsule by mouth daily.   Yes Historical Provider, MD  diltiazem (CARDIZEM LA) 120 MG 24 hr tablet Take 1 tablet (120 mg total) by mouth daily. 10/28/15  Yes Junie Spencerhristy A Hawks, FNP  diltiazem (CARDIZEM) 60 MG tablet Take 1/2 to 1 tablet every 4 hours AS NEEDED for heart rate >100 as long as blood pressure >100 05/11/15  Yes Newman Niponna C Carroll, NP  Magnesium 250 MG TABS Take 2 tablets by mouth daily.   Yes Historical Provider, MD  Multiple Vitamins-Minerals (CENTRUM SILVER ULTRA WOMENS PO) Take 1 capsule by mouth daily.    Yes Historical Provider, MD  rivaroxaban (XARELTO) 20 MG TABS tablet Take 1 tablet (20 mg total) by mouth daily with supper. 12/13/15  Yes Junie Spencerhristy A Hawks, FNP  rosuvastatin (CRESTOR) 20 MG tablet Take 1 tablet by mouth  daily 12/13/15  Yes Junie Spencerhristy A Hawks, FNP  valACYclovir (VALTREX) 500 MG tablet Take 1 tablet by mouth  daily 03/28/16  Yes Junie Spencerhristy A Hawks, FNP  vitamin C (ASCORBIC ACID) 500 MG tablet Take 500 mg by mouth 2 (two) times daily.    Yes Historical Provider, MD  alendronate (FOSAMAX) 70 MG tablet Take 1 tablet (70 mg total) by mouth every 7 (seven) days. Take with a full glass of water on an empty stomach. Patient not taking: Reported on 04/23/2016 11/01/15   Junie Spencerhristy A Hawks, FNP  flecainide (TAMBOCOR) 150 MG tablet Take 1 tablet by mouth as needed for afib Patient not taking: Reported on 04/23/2016 05/11/15   Newman Niponna C Carroll, NP  meloxicam (MOBIC) 7.5 MG tablet Take 1 tablet (7.5 mg total) by mouth daily. Patient not taking: Reported on 04/23/2016 03/10/16    Junie Spencerhristy A Hawks, FNP    Family History Family History  Problem Relation Age of Onset  . Osteoporosis Mother   . Cancer Mother     breast  . Hypertension Mother   . Heart disease Father   . Other Neg Hx     Social History Social History  Substance Use Topics  . Smoking status: Never Smoker  . Smokeless tobacco: Never Used  . Alcohol use No     Allergies   Patient has no known allergies.   Review of Systems Review of Systems  Constitutional: Negative for appetite change and fever.  HENT: Negative for congestion.   Respiratory: Negative for shortness of breath.   Cardiovascular: Positive for palpitations. Negative for chest pain.  Gastrointestinal: Negative for abdominal pain.  Genitourinary: Positive for frequency.  Musculoskeletal: Negative for back pain.  Skin: Negative for wound.  Neurological: Negative for numbness.  Hematological: Negative for adenopathy.  Psychiatric/Behavioral: Negative for confusion.     Physical Exam Updated Vital Signs BP 126/79   Pulse 63   Temp 98.2 F (36.8 C) (Oral)   Resp 16   SpO2 94%   Physical Exam  Constitutional: She appears well-developed.  HENT:  Head: Atraumatic.  Neck: Neck supple.  Cardiovascular:  Irregular tachycardia with rate of around 140  Pulmonary/Chest: Effort normal.  Abdominal: Soft.  Musculoskeletal: She exhibits no edema.  Neurological: She is alert.  Skin: Skin is warm. Capillary refill takes less than 2 seconds.     ED Treatments / Results  Labs (all labs ordered are listed, but only abnormal results are displayed) Labs Reviewed  CBC WITH DIFFERENTIAL/PLATELET - Abnormal; Notable for the following:       Result Value   RBC 5.17 (*)    Hemoglobin 15.7 (*)    All other components within normal limits  I-STAT CHEM 8, ED - Abnormal; Notable for the following:    Glucose, Bld 116 (*)    Calcium, Ion 1.13 (*)    All other components within normal limits  I-STAT TROPOININ, ED    EKG  EKG  Interpretation  Date/Time:  Sunday April 23 2016 22:21:39 EST Ventricular Rate:  69 PR Interval:    QRS Duration: 113 QT Interval:  437 QTC Calculation: 469 R Axis:   -12 Text Interpretation:  Sinus or ectopic atrial rhythm Atrial premature complex Borderline intraventricular conduction delay Consider right ventricular hypertrophy Baseline wander in lead(s) V2 Confirmed by Rubin PayorPICKERING  MD, Harrold DonathNATHAN [phone]([id number]) on 04/23/2016 10:36:34 PM       Radiology No results found.  Procedures .Cardioversion Date/Time: 04/23/2016 10:51 PM Performed by: Benjiman CorePICKERING, Dominie Benedick Authorized by: Benjiman CorePICKERING, Knute Mazzuca  Consent:    Consent obtained:  Written   Consent given by:  Patient   Risks discussed:  Cutaneous burn, death, induced arrhythmia and pain   Alternatives discussed:  Alternative treatment Pre-procedure details:    Cardioversion basis:  Emergent   Rhythm:  Atrial fibrillation   Electrode placement:  Anterior-posterior Attempt one:    Cardioversion mode:  Synchronous   Waveform:  Biphasic   Shock (Joules):  120   Shock outcome:  Conversion to normal sinus rhythm Post-procedure details:    Patient status:  Awake   Patient tolerance of procedure:  Tolerated well, no immediate complications Sedation procedure Date/Time: 04/23/2016 10:52 PM Performed by: Benjiman Core Authorized by: Benjiman Core   Consent:    Consent obtained:  Written   Consent given by:  Patient   Risks discussed:  Allergic reaction, prolonged hypoxia resulting in organ damage, prolonged sedation necessitating reversal, vomiting and respiratory compromise necessitating ventilatory assistance and intubation   Alternatives discussed:  Analgesia without sedation Indications:    Procedure performed:  Cardioversion   Procedure necessitating sedation performed by:  Physician performing sedation   Intended level of sedation:  Moderate (conscious sedation) Pre-sedation assessment:    Time since last food or drink:   4 hrs   ASA classification: class 2 - patient with mild systemic disease     Neck mobility: normal     Mouth opening:  2 finger widths   Mallampati score:  III - soft palate, base of uvula visible   Pre-sedation assessments completed and reviewed: airway patency, cardiovascular function, mental status and respiratory function     History of difficult intubation: no   Immediate pre-procedure details:    Reassessment: Patient reassessed immediately prior to procedure     Reviewed: vital signs, relevant labs/tests and NPO status   Procedure details (see MAR for exact dosages):    Sedation start time:  04/23/2016 10:05 PM   Preoxygenation:  Nasal cannula   Sedation:  Etomidate   Intra-procedure monitoring:  Blood pressure monitoring, continuous capnometry, frequent vital sign checks and continuous pulse oximetry   Intra-procedure events: none     Sedation end time:  04/23/2016 10:15 PM   Total sedation time (minutes):  10 Post-procedure details:    Attendance: Constant attendance by certified staff until patient recovered     Recovery: Patient returned to pre-procedure baseline     Patient is stable for discharge or admission: yes     Patient tolerance:  Tolerated well, no immediate complications   (including critical care time)  Medications Ordered in ED Medications  etomidate (AMIDATE) injection 7.5 mg (7.5 mg Intravenous Given 04/23/16 2212)     Initial Impression / Assessment and Plan / ED Course  I have reviewed the triage vital signs and the nursing notes.  Pertinent labs & imaging results that were available during my care of the patient were reviewed by me and considered in my medical decision making (see chart for details).  Clinical Course     Patient presents in A. fib RVR. History of same. She is on anticoagulation. Took Cardizem at home around an hour and half prior to arrival. Onset of A. fib was around 2 hours prior to arrival. CHADSVASC score of 2. Patient was  anticoagulated in the ER. Converted back to sinus rhythm. Will follow-up with the A. fib clinic.    Final Clinical Impressions(s) / ED Diagnoses   Final diagnoses:  Atrial fibrillation with RVR [facility])    New Prescriptions New Prescriptions  No medications on file     Benjiman Core, MD 04/23/16 2253

## 2016-04-23 NOTE — ED Triage Notes (Signed)
Pt states she started to feel "fluttering" in her chest after 8p tonight.  Took 1/2 a 60 mg cardizem and called EMS.  Rate upon their arrival was 140s.  En route converted to 110s.  Pt had an ablation in the past.  Only one episode of uncontrolled afib since the ablation 2016.  No chest pain

## 2016-04-27 ENCOUNTER — Ambulatory Visit (HOSPITAL_COMMUNITY)
Admission: RE | Admit: 2016-04-27 | Discharge: 2016-04-27 | Disposition: A | Discharge status: Home / Self Care | Payer: 59 | Source: Ambulatory Visit | Attending: Nurse Practitioner | Admitting: Nurse Practitioner

## 2016-04-27 ENCOUNTER — Encounter (HOSPITAL_COMMUNITY): Payer: Self-pay | Admitting: Nurse Practitioner

## 2016-04-27 VITALS — BP 158/84 | HR 69 | Ht 65.0 in | Wt 151.8 lb

## 2016-04-27 DIAGNOSIS — I480 Paroxysmal atrial fibrillation: Secondary | ICD-10-CM | POA: Diagnosis not present

## 2016-04-27 DIAGNOSIS — I4891 Unspecified atrial fibrillation: Secondary | ICD-10-CM | POA: Insufficient documentation

## 2016-04-27 NOTE — Progress Notes (Signed)
Patient ID: Kathryn BillsShirley L Arnold, female   DOB: 1946-04-01, 71 y.o.   MRN: [id number]     Primary Care Physician: Jannifer Rodneyhristy Hawks, FNP [facility]   Kathryn BillsShirley L Arnold is a 71 y.o. female with a h/o afib s/p PVI and aflutter abaltion 11/03/14 for f/u in the afib clinic. She had been doing well maintaining SR.  She is off flecainide, but does have 150 mg to use as needed for breakthrough afib.  She watches her weight carefully and is normal weight, no tobacco/alcohol/ caffeine use.  She is in the afib clinic today for f/u ER visit 12/31 for afib with RVR. Pt  thinks the trigger may have been eating differently over the holidays than her normal low salt, low calorie eating. She was cardioverted successfully. Compliant with DOAC. She took 30 mg of cardizem but became very anxious with the breakthrough afib and called EMS. She said that she was shaking all over.  Today, she denies symptoms of palpitations, chest pain, shortness of breath, orthopnea, PND, lower extremity edema, dizziness, presyncope, syncope, or neurologic sequela. The patient is tolerating medications without difficulties and is otherwise without complaint today.   Past Medical History:  Diagnosis Date  . Abnormal Pap smear   . Complication of anesthesia    alittle slow to wake up-stayed drowsy  . 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 HSV-2 (herpes simplex virus 2) infection   . Hypercholesteremia   . Hyperlipidemia with target LDL less than 100   . Osteoporosis, unspecified    Last DEXA 10/2011   . Paroxysmal atrial fibrillation (HCC)    s/p ablation Afib and flutter 11/03/14  . Vitamin D deficiency    Past Surgical History:  Procedure Laterality Date  . BREAST LUMPECTOMY    . DILATION AND CURETTAGE OF UTERUS    . ELECTROPHYSIOLOGIC STUDY N/A 11/03/2014   Procedure: Atrial Fibrillation Ablation;  Surgeon: Hillis RangeJames Allred, MD;  [facility] LAB;  Service: Cardiovascular;  Laterality: N/A;  .  LAPAROSCOPY ABDOMEN DIAGNOSTIC      Current Outpatient Prescriptions  Medication Sig Dispense Refill  . diltiazem (CARDIZEM LA) 120 MG 24 hr tablet Take 1 tablet (120 mg total) by mouth daily. 90 tablet 2  . diltiazem (CARDIZEM) 60 MG tablet Take 1/2 to 1 tablet every 4 hours AS NEEDED for heart rate >100 as long as blood pressure >100 45 tablet 1  . Magnesium 250 MG TABS Take 2 tablets by mouth daily.    . Multiple Vitamins-Minerals (CENTRUM SILVER ULTRA WOMENS PO) Take 1 capsule by mouth daily.     . rivaroxaban (XARELTO) 20 MG TABS tablet Take 1 tablet (20 mg total) by mouth daily with supper. 90 tablet 1  . rosuvastatin (CRESTOR) 20 MG tablet Take 1 tablet by mouth  daily 90 tablet 1  . valACYclovir (VALTREX) 500 MG tablet Take 1 tablet by mouth  daily 14 tablet 0  . Calcium Carbonate (CALCIUM 600 PO) Take 1 tablet by mouth 2 (two) times daily.    . Coenzyme Q10 (CO Q 10) 100 MG CAPS Take 1 capsule by mouth daily.    . flecainide (TAMBOCOR) 150 MG tablet Take 1 tablet by mouth as needed for afib (Patient not taking: Reported on 04/27/2016) 20 tablet 1  . meloxicam (MOBIC) 7.5 MG tablet Take 1 tablet (7.5 mg total) by mouth daily. (Patient not taking: Reported on 04/27/2016) 30 tablet 0  . vitamin C (ASCORBIC ACID) 500 MG tablet Take 500  mg by mouth 2 (two) times daily.      No current facility-administered medications for this encounter.     No Known Allergies  Social History   Social History  . Marital status: Married    Spouse name: N/A  . Number of children: N/A  . Years of education: N/A   Occupational History  . Not on file.   Social History Main Topics  . Smoking status: Never Smoker  . Smokeless tobacco: Never Used  . Alcohol use No  . Drug use: No  . Sexual activity: Yes    Birth control/ protection: None   Other Topics Concern  . Not on file   Social History Narrative   Lives in [place] with spouse.    Family History  Problem Relation Age of Onset  .  Osteoporosis Mother   . Cancer Mother     breast  . Hypertension Mother   . Heart disease Father   . Other Neg Hx     ROS- All systems are reviewed and negative except as per the HPI above  Physical Exam: Vitals:   04/27/16 1103 04/27/16 1224  BP: (!) 172/86 (!) 158/84  Pulse: 69   Weight: 151 lb 12.8 oz (68.9 kg)   Height: 5\' 5"  (1.651 m)     GEN- The patient is well appearing, alert and oriented x 3 today.   Head- normocephalic, atraumatic Eyes-  Sclera clear, conjunctiva pink Ears- hearing intact Oropharynx- clear Neck- supple, no JVP Lymph- no cervical lymphadenopathy Lungs- Clear to ausculation bilaterally, normal work of breathing Heart- Regular rate and rhythm, no murmurs, rubs or gallops, PMI not laterally displaced GI- soft, NT, ND, + BS Extremities- no clubbing, cyanosis, or edema MS- no significant deformity or atrophy Skin- no rash or lesion Psych- euthymic mood, full affect Neuro- strength and sensation are intact  EKG-SR with IRBBB at 69 bpm, Pr int 124 ms, QRS 96 ms, QTc 422 ms.  Epic records reviewed  Assessment and Plan: 1. Afib Successful cardioversion 12/31 Pt was reminded that after she takes Cardizem if does not convert in 20-30 mins, she should try 150 mg of flecainide to try to avoid ER visit Also encouraged to try to stay calm as she has a tendency to become anxious Continue xarelto without missed doses  F/u afib clinic in3 months   Lupita Leash C. [facility] [address] [place], [place] [address] [phone]

## 2016-05-17 ENCOUNTER — Telehealth: Payer: Self-pay | Admitting: Family

## 2016-05-17 NOTE — Telephone Encounter (Signed)
appt scheduled

## 2016-05-18 ENCOUNTER — Other Ambulatory Visit: Payer: Self-pay | Admitting: Family

## 2016-05-18 ENCOUNTER — Other Ambulatory Visit: Payer: 59

## 2016-05-18 DIAGNOSIS — Z0000 Encounter for general adult medical examination without abnormal findings: Secondary | ICD-10-CM

## 2016-05-19 LAB — CBC WITH DIFFERENTIAL/PLATELET
BASOS ABS: 0 10*3/uL (ref 0.0–0.2)
Basos: 1 %
EOS (ABSOLUTE): 0.1 10*3/uL (ref 0.0–0.4)
EOS: 1 %
Hematocrit: 42.2 % (ref 34.0–46.6)
Hemoglobin: 14.2 g/dL (ref 11.1–15.9)
IMMATURE GRANULOCYTES: 0 %
Immature Grans (Abs): 0 10*3/uL (ref 0.0–0.1)
LYMPHS: 41 %
Lymphocytes Absolute: 1.5 10*3/uL (ref 0.7–3.1)
MCH: 30.2 pg (ref 26.6–33.0)
MCHC: 33.6 g/dL (ref 31.5–35.7)
MCV: 90 fL (ref 79–97)
MONOCYTES: 8 %
MONOS ABS: 0.3 10*3/uL (ref 0.1–0.9)
NEUTROS ABS: 1.8 10*3/uL (ref 1.4–7.0)
Neutrophils: 49 %
PLATELETS: 230 10*3/uL (ref 150–379)
RBC: 4.7 x10E6/uL (ref 3.77–5.28)
RDW: 12.8 % (ref 12.3–15.4)
WBC: 3.6 10*3/uL (ref 3.4–10.8)

## 2016-05-19 LAB — THYROID PANEL WITH TSH
FREE THYROXINE INDEX: 1.9 (ref 1.2–4.9)
T3 Uptake Ratio: 26 % (ref 24–39)
T4, Total: 7.3 ug/dL (ref 4.5–12.0)
TSH: 1.78 u[IU]/mL (ref 0.450–4.500)

## 2016-05-19 LAB — CMP14+EGFR
ALBUMIN: 4.7 g/dL (ref 3.6–4.8)
ALT: 18 IU/L (ref 0–32)
AST: 21 IU/L (ref 0–40)
Albumin/Globulin Ratio: 2.4 — ABNORMAL HIGH (ref 1.2–2.2)
Alkaline Phosphatase: 50 IU/L (ref 39–117)
BUN / CREAT RATIO: 19 (ref 12–28)
BUN: 16 mg/dL (ref 8–27)
Bilirubin Total: 0.4 mg/dL (ref 0.0–1.2)
CALCIUM: 9.5 mg/dL (ref 8.7–10.3)
CO2: 28 mmol/L (ref 18–29)
CREATININE: 0.83 mg/dL (ref 0.57–1.00)
Chloride: 103 mmol/L (ref 96–106)
GFR, EST AFRICAN AMERICAN: 84 mL/min/{1.73_m2} (ref >59)
GFR, EST NON AFRICAN AMERICAN: 73 mL/min/{1.73_m2} (ref >59)
GLUCOSE: 105 mg/dL — AB (ref 65–99)
Globulin, Total: 2 g/dL (ref 1.5–4.5)
Potassium: 4.9 mmol/L (ref 3.5–5.2)
Sodium: 146 mmol/L — ABNORMAL HIGH (ref 134–144)
TOTAL PROTEIN: 6.7 g/dL (ref 6.0–8.5)

## 2016-05-19 LAB — LIPID PANEL
CHOLESTEROL TOTAL: 127 mg/dL (ref 100–199)
Chol/HDL Ratio: 3 ratio units (ref 0.0–4.4)
HDL: 43 mg/dL (ref >39)
LDL Calculated: 63 mg/dL (ref 0–99)
Triglycerides: 103 mg/dL (ref 0–149)
VLDL Cholesterol Cal: 21 mg/dL (ref 5–40)

## 2016-05-19 LAB — VITAMIN D 25 HYDROXY (VIT D DEFICIENCY, FRACTURES): VIT D 25 HYDROXY: 46.4 ng/mL (ref 30.0–100.0)

## 2016-05-22 ENCOUNTER — Ambulatory Visit (INDEPENDENT_AMBULATORY_CARE_PROVIDER_SITE_OTHER): Payer: 59 | Admitting: Family

## 2016-05-22 ENCOUNTER — Encounter: Payer: Self-pay | Admitting: Family

## 2016-05-22 VITALS — BP 124/74 | HR 65 | Temp 97.2°F | Ht 65.0 in | Wt 151.6 lb

## 2016-05-22 DIAGNOSIS — Z23 Encounter for immunization: Secondary | ICD-10-CM | POA: Diagnosis not present

## 2016-05-22 DIAGNOSIS — F411 Generalized anxiety disorder: Secondary | ICD-10-CM

## 2016-05-22 DIAGNOSIS — I10 Essential (primary) hypertension: Secondary | ICD-10-CM

## 2016-05-22 DIAGNOSIS — B009 Herpesviral infection, unspecified: Secondary | ICD-10-CM

## 2016-05-22 DIAGNOSIS — E785 Hyperlipidemia, unspecified: Secondary | ICD-10-CM

## 2016-05-22 DIAGNOSIS — Z0000 Encounter for general adult medical examination without abnormal findings: Secondary | ICD-10-CM

## 2016-05-22 DIAGNOSIS — M810 Age-related osteoporosis without current pathological fracture: Secondary | ICD-10-CM

## 2016-05-22 DIAGNOSIS — E559 Vitamin D deficiency, unspecified: Secondary | ICD-10-CM

## 2016-05-22 DIAGNOSIS — I480 Paroxysmal atrial fibrillation: Secondary | ICD-10-CM

## 2016-05-22 NOTE — Progress Notes (Signed)
Subjective:    Patient ID: Kathryn BillsShirley L Grob, female    DOB: [birth year], 71 y.o.   MRN: [id number]  Pt presents to the office today for chronic follow up.  Hypertension  This is a chronic problem. The current episode started more than 1 year ago. The problem has been resolved since onset. The problem is controlled. Associated symptoms include anxiety. Pertinent negatives include no headaches, palpitations ("every now and then"), peripheral edema or shortness of breath. Risk factors for coronary artery disease include dyslipidemia, post-menopausal state and family history. Past treatments include beta blockers and calcium channel blockers. The current treatment provides moderate improvement. There is no history of kidney disease, CAD/MI, CVA or heart failure. There is no history of sleep apnea or a thyroid problem.  Hyperlipidemia  This is a chronic problem. The current episode started more than 1 year ago. The problem is controlled. Recent lipid tests were reviewed and are normal. She has no history of diabetes or hypothyroidism. Pertinent negatives include no leg pain, myalgias or shortness of breath. Current antihyperlipidemic treatment includes statins. The current treatment provides moderate improvement of lipids. Risk factors for coronary artery disease include dyslipidemia, family history, hypertension and post-menopausal.  Anxiety  Presents for follow-up visit. The problem has been gradually improving. Patient reports no decreased concentration, depressed mood, excessive worry, irritability, nervous/anxious behavior, palpitations ("every now and then") or shortness of breath. Symptoms occur rarely. The severity of symptoms is moderate.   Her past medical history is significant for anxiety/panic attacks, asthma and depression. There is no history of CAD. Past treatments include nothing. Compliance with prior treatments has been good.  A Fib Pt currently taking xarelto 20 mg and Cardizem 120 mg. Pt  has Cardizem 60 mg and flecainide 150 mg  as needed. Pt is followed by Cardiologists every 6 months.  Osteoporosis Pt stopped her fosamax 70 mg, because she thought that may be causing her hand pain. Last Dexascan was 02/23/2015 Herpes Simplex 2 Pt currently taking valtrex 500 mg daily. PT states it has been 15 years since her last breakout.   Review of Systems  Constitutional: Negative.  Negative for irritability.  HENT: Negative.   Eyes: Negative.   Respiratory: Negative.  Negative for shortness of breath.   Cardiovascular: Negative for palpitations ("every now and then").  Gastrointestinal: Negative.   Endocrine: Negative.   Genitourinary: Negative.   Musculoskeletal: Negative.  Negative for myalgias.  Neurological: Negative.  Negative for headaches.  Hematological: Negative.   Psychiatric/Behavioral: Negative.  Negative for decreased concentration. The patient is not nervous/anxious.   All other systems reviewed and are negative.      Objective:   Physical Exam  Constitutional: She is oriented to person, place, and time. She appears well-developed and well-nourished. No distress.  HENT:  Head: Normocephalic and atraumatic.  Right Ear: External ear normal.  Left Ear: External ear normal.  Nose: Nose normal.  Mouth/Throat: Oropharynx is clear and moist.  Eyes: Pupils are equal, round, and reactive to light.  Neck: Normal range of motion. Neck supple. No thyromegaly present.  Cardiovascular: Normal rate, regular rhythm, normal heart sounds and intact distal pulses.   No murmur heard. Pulmonary/Chest: Effort normal and breath sounds normal. No respiratory distress. She has no wheezes.  Abdominal: Soft. Bowel sounds are normal. She exhibits no distension. There is no tenderness.  Musculoskeletal: Normal range of motion. She exhibits no edema or tenderness.  Neurological: She is alert and oriented to person, place, and time. She has normal  reflexes. No cranial nerve deficit.    Skin: Skin is warm and dry.  Psychiatric: She has a normal mood and affect. Her behavior is normal. Judgment and thought content normal.  Vitals reviewed.     BP 139/87   Pulse 65   Temp 97.2 F (36.2 C) (Oral)   Ht 5\' 5"  (1.651 m)   Wt 151 lb 9.6 oz (68.8 kg)   BMI 25.23 kg/m      Assessment & Plan:  1. Essential hypertension, benign  2. Paroxysmal atrial fibrillation (HCC)  3. Osteoporosis, unspecified osteoporosis type, unspecified pathological fracture presence  4. Vitamin D deficiency  5. GAD (generalized anxiety disorder)  6. HSV-2 (herpes simplex virus 2) infection  7. Hyperlipidemia with target LDL less than 100   Continue all meds Labs discussed Health Maintenance reviewed Diet and exercise encouraged RTO 6 months  [name], FNP

## 2016-05-22 NOTE — Addendum Note (Signed)
Addended by: Almeta MonasSTONE, Lashaye Fisk M on: 05/22/2016 11:58 AM   Modules accepted: Orders

## 2016-05-22 NOTE — Patient Instructions (Signed)

## 2016-06-12 ENCOUNTER — Telehealth (HOSPITAL_COMMUNITY): Payer: Self-pay | Admitting: *Deleted

## 2016-06-12 NOTE — Telephone Encounter (Signed)
Patient called in stating she had 2 episodes of afib since last Tuesday. The first episode she took prn cardizem followed by PIP flecainide with relief 2 hours after the flecainide dose. The second episode did break without medication intervention but pt is worried of what she should do. Discussed with patient she did the correct actions by taking prn medications for breakthrough afib and she can continue to do this. If the episodes continue to increase in frequency/duration she can be restarted on daily flecainide at direction of Rudi Cocoonna Carroll NP or she could discuss repeat ablation with [name]. Pt decided she would watch episodes/response to medications for now and will call if episodes increase to be restarted on daily flecainide.

## 2016-06-27 ENCOUNTER — Encounter (HOSPITAL_COMMUNITY): Payer: Self-pay | Admitting: *Deleted

## 2016-07-04 ENCOUNTER — Ambulatory Visit (INDEPENDENT_AMBULATORY_CARE_PROVIDER_SITE_OTHER): Payer: 59 | Admitting: Family

## 2016-07-04 ENCOUNTER — Encounter: Payer: Self-pay | Admitting: Family

## 2016-07-04 VITALS — BP 128/86 | HR 65 | Temp 97.2°F | Ht 65.0 in | Wt 147.0 lb

## 2016-07-04 DIAGNOSIS — E785 Hyperlipidemia, unspecified: Secondary | ICD-10-CM

## 2016-07-04 DIAGNOSIS — F411 Generalized anxiety disorder: Secondary | ICD-10-CM

## 2016-07-04 DIAGNOSIS — I10 Essential (primary) hypertension: Secondary | ICD-10-CM | POA: Diagnosis not present

## 2016-07-04 DIAGNOSIS — B009 Herpesviral infection, unspecified: Secondary | ICD-10-CM

## 2016-07-04 DIAGNOSIS — I480 Paroxysmal atrial fibrillation: Secondary | ICD-10-CM

## 2016-07-04 DIAGNOSIS — E559 Vitamin D deficiency, unspecified: Secondary | ICD-10-CM

## 2016-07-04 DIAGNOSIS — M810 Age-related osteoporosis without current pathological fracture: Secondary | ICD-10-CM | POA: Diagnosis not present

## 2016-07-04 MED ORDER — ROSUVASTATIN CALCIUM 20 MG PO TABS: ORAL_TABLET | ORAL | 1 refills | Status: DC

## 2016-07-04 MED ORDER — DILTIAZEM HCL ER COATED BEADS 120 MG PO TB24: 120.0000 mg | ORAL_TABLET | Freq: Every day | ORAL | 2 refills | Status: DC

## 2016-07-04 MED ORDER — VALACYCLOVIR HCL 500 MG PO TABS: ORAL_TABLET | ORAL | 2 refills | Status: DC

## 2016-07-04 MED ORDER — RIVAROXABAN 20 MG PO TABS: ORAL_TABLET | ORAL | 1 refills | Status: DC

## 2016-07-04 NOTE — Progress Notes (Signed)
Subjective:    Patient ID: Kathryn BillsShirley L Jillson, female    DOB: July 19, 1945, 71 y.o.   MRN: [id number]  Pt presents to the office today for chronic follow up.  Hypertension  This is a chronic problem. The current episode started more than 1 year ago. The problem has been resolved since onset. The problem is controlled. Associated symptoms include anxiety. Pertinent negatives include no headaches, palpitations ("every now and then"), peripheral edema or shortness of breath. Risk factors for coronary artery disease include dyslipidemia, post-menopausal state and family history. Past treatments include beta blockers and calcium channel blockers. The current treatment provides moderate improvement. There is no history of kidney disease, CAD/MI, CVA or heart failure. There is no history of sleep apnea or a thyroid problem.  Hyperlipidemia  This is a chronic problem. The current episode started more than 1 year ago. The problem is controlled. Recent lipid tests were reviewed and are normal. She has no history of diabetes or hypothyroidism. Pertinent negatives include no leg pain, myalgias or shortness of breath. Current antihyperlipidemic treatment includes statins. The current treatment provides moderate improvement of lipids. Risk factors for coronary artery disease include dyslipidemia, family history, hypertension and post-menopausal.  Anxiety  Presents for follow-up visit. The problem has been gradually improving. Symptoms include nervous/anxious behavior. Patient reports no decreased concentration, depressed mood, excessive worry, irritability, palpitations ("every now and then") or shortness of breath. Symptoms occur rarely. The severity of symptoms is moderate.   Her past medical history is significant for anxiety/panic attacks, asthma and depression. There is no history of CAD. Past treatments include nothing. Compliance with prior treatments has been good.  A Fib Pt currently taking xarelto 20 mg and  Cardizem 120 mg. Pt has Cardizem 60 mg and flecainide 150 mg  as needed. Pt is followed by Cardiologists every 6 months.  Osteoporosis Pt stopped her fosamax 70 mg, because she thought that may be causing her hand pain. Last Dexascan was 02/23/2015 Herpes Simplex 2 Pt currently taking valtrex 500 mg daily. PT states it has been 15 years since her last breakout.   Review of Systems  Constitutional: Negative.  Negative for irritability.  HENT: Negative.   Eyes: Negative.   Respiratory: Negative.  Negative for shortness of breath.   Cardiovascular: Negative for palpitations ("every now and then").  Gastrointestinal: Negative.   Endocrine: Negative.   Genitourinary: Negative.   Musculoskeletal: Negative.  Negative for myalgias.  Neurological: Negative.  Negative for headaches.  Hematological: Negative.   Psychiatric/Behavioral: Negative for decreased concentration. The patient is nervous/anxious.   All other systems reviewed and are negative.      Objective:   Physical Exam  Constitutional: She is oriented to person, place, and time. She appears well-developed and well-nourished. No distress.  HENT:  Head: Normocephalic and atraumatic.  Right Ear: External ear normal.  Left Ear: External ear normal.  Nose: Nose normal.  Mouth/Throat: Oropharynx is clear and moist.  Eyes: Pupils are equal, round, and reactive to light.  Neck: Normal range of motion. Neck supple. No thyromegaly present.  Cardiovascular: Normal rate, regular rhythm, normal heart sounds and intact distal pulses.   No murmur heard. Pulmonary/Chest: Effort normal and breath sounds normal. No respiratory distress. She has no wheezes.  Abdominal: Soft. Bowel sounds are normal. She exhibits no distension. There is no tenderness.  Musculoskeletal: Normal range of motion. She exhibits no edema or tenderness.  Neurological: She is alert and oriented to person, place, and time.  Skin: Skin is  warm and dry.  Psychiatric: She  has a normal mood and affect. Her behavior is normal. Judgment and thought content normal.  Vitals reviewed.     BP 128/86   Pulse 65   Temp 97.2 F (36.2 C) (Oral)   Ht 5\' 5"  (1.651 m)   Wt 147 lb (66.7 kg)   BMI 24.46 kg/m      Assessment & Plan:  1. Hyperlipidemia with target LDL less than 100 - rosuvastatin (CRESTOR) 20 MG tablet; Take 1 tablet by mouth  daily  Dispense: 90 tablet; Refill: 1  2. HSV-2 (herpes simplex virus 2) infection - valACYclovir (VALTREX) 500 MG tablet; Take 1 tablet by mouth  daily  Dispense: 90 tablet; Refill: 2  3. Essential hypertension, benign - diltiazem (CARDIZEM LA) 120 MG 24 hr tablet; Take 1 tablet (120 mg total) by mouth daily.  Dispense: 90 tablet; Refill: 2  4. Paroxysmal atrial fibrillation (HCC) - rivaroxaban (XARELTO) 20 MG TABS tablet; Take 1 tablet (20 mg total) by mouth daily with supper.  Dispense: 90 tablet; Refill: 1 - diltiazem (CARDIZEM LA) 120 MG 24 hr tablet; Take 1 tablet (120 mg total) by mouth daily.  Dispense: 90 tablet; Refill: 2  5. Osteoporosis, unspecified osteoporosis type, unspecified pathological fracture presence  6. GAD (generalized anxiety disorder)   7. Vitamin D deficiency   Continue all meds Labs pending Health Maintenance reviewed Diet and exercise encouraged RTO 6 months   [name], FNP

## 2016-07-04 NOTE — Patient Instructions (Signed)
Atrial Fibrillation Atrial fibrillation is a type of irregular or rapid heartbeat (arrhythmia). In atrial fibrillation, the heart quivers continuously in a chaotic pattern. This occurs when parts of the heart receive disorganized signals that make the heart unable to pump blood normally. This can increase the risk for stroke, heart failure, and other heart-related conditions. There are different types of atrial fibrillation, including:  Paroxysmal atrial fibrillation. This type starts suddenly, and it usually stops on its own shortly after it starts.  Persistent atrial fibrillation. This type often lasts longer than a week. It may stop on its own or with treatment.  Long-lasting persistent atrial fibrillation. This type lasts longer than 12 months.  Permanent atrial fibrillation. This type does not go away.  Talk with your health care provider to learn about the type of atrial fibrillation that you have. What are the causes? This condition is caused by some heart-related conditions or procedures, including:  A heart attack.  Coronary artery disease.  Heart failure.  Heart valve conditions.  High blood pressure.  Inflammation of the sac that surrounds the heart (pericarditis).  Heart surgery.  Certain heart rhythm disorders, such as Wolf-Parkinson-White syndrome.  Other causes include:  Pneumonia.  Obstructive sleep apnea.  Blockage of an artery in the lungs (pulmonary embolism, or PE).  Lung cancer.  Chronic lung disease.  Thyroid problems, especially if the thyroid is overactive (hyperthyroidism).  Caffeine.  Excessive alcohol use or illegal drug use.  Use of some medicines, including certain decongestants and diet pills.  Sometimes, the cause cannot be found. What increases the risk? This condition is more likely to develop in:  People who are older in age.  People who smoke.  People who have diabetes mellitus.  People who are overweight  (obese).  Athletes who exercise vigorously.  What are the signs or symptoms? Symptoms of this condition include:  A feeling that your heart is beating rapidly or irregularly.  A feeling of discomfort or pain in your chest.  Shortness of breath.  Sudden light-headedness or weakness.  Getting tired easily during exercise.  In some cases, there are no symptoms. How is this diagnosed? Your health care provider may be able to detect atrial fibrillation when taking your pulse. If detected, this condition may be diagnosed with:  An electrocardiogram (ECG).  A Holter monitor test that records your heartbeat patterns over a 24-hour period.  Transthoracic echocardiogram (TTE) to evaluate how blood flows through your heart.  Transesophageal echocardiogram (TEE) to view more detailed images of your heart.  A stress test.  Imaging tests, such as a CT scan or chest X-ray.  Blood tests.  How is this treated? The main goals of treatment are to prevent blood clots from forming and to keep your heart beating at a normal rate and rhythm. The type of treatment that you receive depends on many factors, such as your underlying medical conditions and how you feel when you are experiencing atrial fibrillation. This condition may be treated with:  Medicine to slow down the heart rate, bring the heart's rhythm back to normal, or prevent clots from forming.  Electrical cardioversion. This is a procedure that resets your heart's rhythm by delivering a controlled, low-energy shock to the heart through your skin.  Different types of ablation, such as catheter ablation, catheter ablation with pacemaker, or surgical ablation. These procedures destroy the heart tissues that send abnormal signals. When the pacemaker is used, it is placed under your skin to help your heart beat in   a regular rhythm.  Follow these instructions at home:  Take over-the counter and prescription medicines only as told by your  health care provider.  If your health care provider prescribed a blood-thinning medicine (anticoagulant), take it exactly as told. Taking too much blood-thinning medicine can cause bleeding. If you do not take enough blood-thinning medicine, you will not have the protection that you need against stroke and other problems.  Do not use tobacco products, including cigarettes, chewing tobacco, and e-cigarettes. If you need help quitting, ask your health care provider.  If you have obstructive sleep apnea, manage your condition as told by your health care provider.  Do not drink alcohol.  Do not drink beverages that contain caffeine, such as coffee, soda, and tea.  Maintain a healthy weight. Do not use diet pills unless your health care provider approves. Diet pills may make heart problems worse.  Follow diet instructions as told by your health care provider.  Exercise regularly as told by your health care provider.  Keep all follow-up visits as told by your health care provider. This is important. How is this prevented?  Avoid drinking beverages that contain caffeine or alcohol.  Avoid certain medicines, especially medicines that are used for breathing problems.  Avoid certain herbs and herbal medicines, such as those that contain ephedra or ginseng.  Do not use illegal drugs, such as cocaine and amphetamines.  Do not smoke.  Manage your high blood pressure. Contact a health care provider if:  You notice a change in the rate, rhythm, or strength of your heartbeat.  You are taking an anticoagulant and you notice increased bruising.  You tire more easily when you exercise or exert yourself. Get help right away if:  You have chest pain, abdominal pain, sweating, or weakness.  You feel nauseous.  You notice blood in your vomit, bowel movement, or urine.  You have shortness of breath.  You suddenly have swollen feet and ankles.  You feel dizzy.  You have sudden weakness or  numbness of the face, arm, or leg, especially on one side of the body.  You have trouble speaking, trouble understanding, or both (aphasia).  Your face or your eyelid droops on one side. These symptoms may represent a serious problem that is an emergency. Do not wait to see if the symptoms will go away. Get medical help right away. Call your local emergency services (911 in the [place] Do not drive yourself to the hospital. This information is not intended to replace advice given to you by your health care provider. Make sure you discuss any questions you have with your health care provider. Document Released: 04/10/2005 Document Revised: 08/18/2015 Document Reviewed: 08/05/2014 Elsevier Interactive Patient Education  2017 Elsevier Inc.  

## 2016-07-07 ENCOUNTER — Telehealth (HOSPITAL_COMMUNITY): Payer: Self-pay | Admitting: *Deleted

## 2016-07-07 ENCOUNTER — Ambulatory Visit (INDEPENDENT_AMBULATORY_CARE_PROVIDER_SITE_OTHER): Payer: 59 | Admitting: Family

## 2016-07-07 VITALS — BP 133/74 | HR 63 | Temp 97.3°F

## 2016-07-07 DIAGNOSIS — I4891 Unspecified atrial fibrillation: Secondary | ICD-10-CM | POA: Diagnosis not present

## 2016-07-07 NOTE — Patient Instructions (Signed)
Atrial Fibrillation Atrial fibrillation is a type of irregular or rapid heartbeat (arrhythmia). In atrial fibrillation, the heart quivers continuously in a chaotic pattern. This occurs when parts of the heart receive disorganized signals that make the heart unable to pump blood normally. This can increase the risk for stroke, heart failure, and other heart-related conditions. There are different types of atrial fibrillation, including:  Paroxysmal atrial fibrillation. This type starts suddenly, and it usually stops on its own shortly after it starts.  Persistent atrial fibrillation. This type often lasts longer than a week. It may stop on its own or with treatment.  Long-lasting persistent atrial fibrillation. This type lasts longer than 12 months.  Permanent atrial fibrillation. This type does not go away.  Talk with your health care provider to learn about the type of atrial fibrillation that you have. What are the causes? This condition is caused by some heart-related conditions or procedures, including:  A heart attack.  Coronary artery disease.  Heart failure.  Heart valve conditions.  High blood pressure.  Inflammation of the sac that surrounds the heart (pericarditis).  Heart surgery.  Certain heart rhythm disorders, such as Wolf-Parkinson-White syndrome.  Other causes include:  Pneumonia.  Obstructive sleep apnea.  Blockage of an artery in the lungs (pulmonary embolism, or PE).  Lung cancer.  Chronic lung disease.  Thyroid problems, especially if the thyroid is overactive (hyperthyroidism).  Caffeine.  Excessive alcohol use or illegal drug use.  Use of some medicines, including certain decongestants and diet pills.  Sometimes, the cause cannot be found. What increases the risk? This condition is more likely to develop in:  People who are older in age.  People who smoke.  People who have diabetes mellitus.  People who are overweight  (obese).  Athletes who exercise vigorously.  What are the signs or symptoms? Symptoms of this condition include:  A feeling that your heart is beating rapidly or irregularly.  A feeling of discomfort or pain in your chest.  Shortness of breath.  Sudden light-headedness or weakness.  Getting tired easily during exercise.  In some cases, there are no symptoms. How is this diagnosed? Your health care provider may be able to detect atrial fibrillation when taking your pulse. If detected, this condition may be diagnosed with:  An electrocardiogram (ECG).  A Holter monitor test that records your heartbeat patterns over a 24-hour period.  Transthoracic echocardiogram (TTE) to evaluate how blood flows through your heart.  Transesophageal echocardiogram (TEE) to view more detailed images of your heart.  A stress test.  Imaging tests, such as a CT scan or chest X-ray.  Blood tests.  How is this treated? The main goals of treatment are to prevent blood clots from forming and to keep your heart beating at a normal rate and rhythm. The type of treatment that you receive depends on many factors, such as your underlying medical conditions and how you feel when you are experiencing atrial fibrillation. This condition may be treated with:  Medicine to slow down the heart rate, bring the heart's rhythm back to normal, or prevent clots from forming.  Electrical cardioversion. This is a procedure that resets your heart's rhythm by delivering a controlled, low-energy shock to the heart through your skin.  Different types of ablation, such as catheter ablation, catheter ablation with pacemaker, or surgical ablation. These procedures destroy the heart tissues that send abnormal signals. When the pacemaker is used, it is placed under your skin to help your heart beat in   a regular rhythm.  Follow these instructions at home:  Take over-the counter and prescription medicines only as told by your  health care provider.  If your health care provider prescribed a blood-thinning medicine (anticoagulant), take it exactly as told. Taking too much blood-thinning medicine can cause bleeding. If you do not take enough blood-thinning medicine, you will not have the protection that you need against stroke and other problems.  Do not use tobacco products, including cigarettes, chewing tobacco, and e-cigarettes. If you need help quitting, ask your health care provider.  If you have obstructive sleep apnea, manage your condition as told by your health care provider.  Do not drink alcohol.  Do not drink beverages that contain caffeine, such as coffee, soda, and tea.  Maintain a healthy weight. Do not use diet pills unless your health care provider approves. Diet pills may make heart problems worse.  Follow diet instructions as told by your health care provider.  Exercise regularly as told by your health care provider.  Keep all follow-up visits as told by your health care provider. This is important. How is this prevented?  Avoid drinking beverages that contain caffeine or alcohol.  Avoid certain medicines, especially medicines that are used for breathing problems.  Avoid certain herbs and herbal medicines, such as those that contain ephedra or ginseng.  Do not use illegal drugs, such as cocaine and amphetamines.  Do not smoke.  Manage your high blood pressure. Contact a health care provider if:  You notice a change in the rate, rhythm, or strength of your heartbeat.  You are taking an anticoagulant and you notice increased bruising.  You tire more easily when you exercise or exert yourself. Get help right away if:  You have chest pain, abdominal pain, sweating, or weakness.  You feel nauseous.  You notice blood in your vomit, bowel movement, or urine.  You have shortness of breath.  You suddenly have swollen feet and ankles.  You feel dizzy.  You have sudden weakness or  numbness of the face, arm, or leg, especially on one side of the body.  You have trouble speaking, trouble understanding, or both (aphasia).  Your face or your eyelid droops on one side. These symptoms may represent a serious problem that is an emergency. Do not wait to see if the symptoms will go away. Get medical help right away. Call your local emergency services (911 in the [place] Do not drive yourself to the hospital. This information is not intended to replace advice given to you by your health care provider. Make sure you discuss any questions you have with your health care provider. Document Released: 04/10/2005 Document Revised: 08/18/2015 Document Reviewed: 08/05/2014 Elsevier Interactive Patient Education  2017 Elsevier Inc.  

## 2016-07-07 NOTE — Progress Notes (Signed)
   Subjective:    Patient ID: Kathryn Arnold, female    DOB: 15-Mar-1946, 71 y.o.   MRN: [id number]  PT presents to the office today with A Fib. PT currently taking xarelto daily. CHADSVASC score of 2. PT has not taken cardizem 60 mg or flecainide yet.  Palpitations   This is a chronic problem. The current episode started more than 1 month ago. The problem occurs intermittently. The problem has been waxing and waning. Nothing aggravates the symptoms. Associated symptoms include anxiety, an irregular heartbeat and malaise/fatigue. She has tried deep relaxation for the symptoms. The treatment provided mild relief.      Review of Systems  Constitutional: Positive for malaise/fatigue.  Cardiovascular: Positive for palpitations.  Psychiatric/Behavioral: The patient is nervous/anxious.   All other systems reviewed and are negative.      Objective:   Physical Exam  Constitutional: She is oriented to person, place, and time. She appears well-developed and well-nourished. No distress.  HENT:  Head: Normocephalic.  Eyes: Pupils are equal, round, and reactive to light.  Neck: Normal range of motion. Neck supple. No thyromegaly present.  Cardiovascular: Normal rate, normal heart sounds and intact distal pulses.  An irregular rhythm present.  No murmur heard. Pulmonary/Chest: Effort normal and breath sounds normal. No respiratory distress. She has no wheezes.  Abdominal: Soft. Bowel sounds are normal. She exhibits no distension. There is no tenderness.  Musculoskeletal: Normal range of motion. She exhibits no edema or tenderness.  Neurological: She is alert and oriented to person, place, and time.  Skin: Skin is warm and dry.  Psychiatric: She has a normal mood and affect. Her behavior is normal. Judgment and thought content normal.  Vitals reviewed.  1:52- Pt advised to take cardizem 60 mg. Will continue to monitor her BP.    BP (!) 156/83 (BP Location: Left Arm, Patient Position: Sitting,  Cuff Size: Normal)   Pulse 74   Temp 97.3 F (36.3 C) (Oral)      Assessment & Plan:  1. Atrial fibrillation, unspecified type (HCC) -Pt took Cardizem- BP and EKG  -Avoid caffeine and stress -Follow up with Cardiologist  - EKG 12-Lead  Jannifer Rodneyhristy Hawks, FNP

## 2016-07-07 NOTE — Telephone Encounter (Signed)
Pt cld reporting afib x3 days and having gone to primary care and having abnormal EKG that is "flipping in and out" of rhythm.  Pt stated she was told by PCP to take her Diltiazem 60 mg 1/2 tab.  Pt had heart rate of 82 bpm at that time.  Pt cld to schedule appt for ASAP since she wants to discuss starting daily flecainide. Pt was advised per RN to only take the cardizem if HR is over 100 and to continue to monitor and call Monday with progress.  Pt understood and sched for Tues.

## 2016-07-11 ENCOUNTER — Encounter (HOSPITAL_COMMUNITY): Payer: Self-pay | Admitting: Nurse Practitioner

## 2016-07-11 ENCOUNTER — Ambulatory Visit (HOSPITAL_COMMUNITY)
Admission: RE | Admit: 2016-07-11 | Discharge: 2016-07-11 | Disposition: A | Discharge status: Home / Self Care | Payer: 59 | Source: Ambulatory Visit | Attending: Nurse Practitioner | Admitting: Nurse Practitioner

## 2016-07-11 VITALS — BP 160/84 | HR 75 | Ht 65.0 in | Wt 145.0 lb

## 2016-07-11 DIAGNOSIS — I4892 Unspecified atrial flutter: Secondary | ICD-10-CM | POA: Insufficient documentation

## 2016-07-11 DIAGNOSIS — M810 Age-related osteoporosis without current pathological fracture: Secondary | ICD-10-CM | POA: Insufficient documentation

## 2016-07-11 DIAGNOSIS — E7800 Pure hypercholesterolemia, unspecified: Secondary | ICD-10-CM | POA: Diagnosis not present

## 2016-07-11 DIAGNOSIS — I4510 Unspecified right bundle-branch block: Secondary | ICD-10-CM | POA: Diagnosis not present

## 2016-07-11 DIAGNOSIS — Z9889 Other specified postprocedural states: Secondary | ICD-10-CM | POA: Diagnosis not present

## 2016-07-11 DIAGNOSIS — E559 Vitamin D deficiency, unspecified: Secondary | ICD-10-CM | POA: Diagnosis not present

## 2016-07-11 DIAGNOSIS — Z7901 Long term (current) use of anticoagulants: Secondary | ICD-10-CM | POA: Diagnosis not present

## 2016-07-11 DIAGNOSIS — I491 Atrial premature depolarization: Secondary | ICD-10-CM | POA: Insufficient documentation

## 2016-07-11 DIAGNOSIS — I480 Paroxysmal atrial fibrillation: Secondary | ICD-10-CM | POA: Diagnosis present

## 2016-07-11 NOTE — Progress Notes (Signed)
Patient ID: Kathryn Arnold, female   DOB: 1945-06-06, 71 y.o.   MRN: [id number]     Primary Care Physician: [name], FNP Referring Physician: [facility] f/u   Kathryn Arnold is a 71 y.o. female with a h/o afib s/p PVI and aflutter abaltion 11/03/14 for f/u in the afib clinic. She had been doing well maintaining SR.  She is off flecainide, but does have 150 mg to use as needed for breakthrough afib.  She watches her weight carefully and is normal weight, no tobacco/alcohol/ caffeine use.  She is in the afib clinic today for f/u ER visit 12/31 for afib with RVR. Pt  thinks the trigger may have been eating differently over the holidays than her normal low salt, low calorie eating. She was cardioverted successfully. Compliant with DOAC. She took 30 mg of cardizem but became very anxious with the breakthrough afib and called EMS. She said that she was shaking all over.  Asked to be seen today for irregular heart beat. She was at her PCP last week and has several EKG's. She was told she was in afib but ekg's were reviewed ans showed SR with PAC's. Her EKg today showed PAC's as well. She is going to [place] nesx week and was worried re heart rhythm.   Today, she denies symptoms of   chest pain, shortness of breath, orthopnea, PND, lower extremity edema, dizziness, presyncope, syncope, or neurologic sequela. The patient is tolerating medications without difficulties and is otherwise without complaint today.   Past Medical History:  Diagnosis Date  . Abnormal Pap smear   . Complication of anesthesia    alittle slow to wake up-stayed drowsy  . 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 HSV-2 (herpes simplex virus 2) infection   . Hypercholesteremia   . Hyperlipidemia with target LDL less than 100   . Osteoporosis, unspecified    Last DEXA 10/2011   . Paroxysmal atrial fibrillation (HCC)    s/p ablation Afib and flutter 11/03/14  . Vitamin D deficiency    Past Surgical History:  Procedure  Laterality Date  . BREAST LUMPECTOMY    . DILATION AND CURETTAGE OF UTERUS    . ELECTROPHYSIOLOGIC STUDY N/A 11/03/2014   Procedure: Atrial Fibrillation Ablation;  Surgeon: Hillis Range, MD;  Location: [facility] INVASIVE CV LAB;  Service: Cardiovascular;  Laterality: N/A;  . LAPAROSCOPY ABDOMEN DIAGNOSTIC      Current Outpatient Prescriptions  Medication Sig Dispense Refill  . Calcium Carbonate (CALCIUM 600 PO) Take 1 tablet by mouth 2 (two) times daily.    [name] diltiazem (CARDIZEM LA) 120 MG 24 hr tablet Take 1 tablet (120 mg total) by mouth daily. 90 tablet 2  . diltiazem (CARDIZEM) 60 MG tablet Take 1/2 to 1 tablet every 4 hours AS NEEDED for heart rate >100 as long as blood pressure >100 45 tablet 1  . Magnesium 250 MG TABS Take 2 tablets by mouth daily.    . meloxicam (MOBIC) 7.5 MG tablet Take 1 tablet (7.5 mg total) by mouth daily. 30 tablet 0  . Multiple Vitamins-Minerals (CENTRUM SILVER ULTRA WOMENS PO) Take 1 capsule by mouth daily.     . rivaroxaban (XARELTO) 20 MG TABS tablet Take 1 tablet (20 mg total) by mouth daily with supper. 90 tablet 1  . rosuvastatin (CRESTOR) 20 MG tablet Take 1 tablet by mouth  daily 90 tablet 1  . valACYclovir (VALTREX) 500 MG tablet Take 1 tablet by mouth  daily 90 tablet 2  .  vitamin C (ASCORBIC ACID) 500 MG tablet Take 500 mg by mouth 2 (two) times daily.     . Coenzyme Q10 (CO Q 10) 100 MG CAPS Take 1 capsule by mouth daily.    . flecainide (TAMBOCOR) 150 MG tablet Take 1 tablet by mouth as needed for afib (Patient not taking: Reported on 07/11/2016) 20 tablet 1   No current facility-administered medications for this encounter.     No Known Allergies  Social History   Social History  . Marital status: Married    Spouse name: N/A  . Number of children: N/A  . Years of education: N/A   Occupational History  . Not on file.   Social History Main Topics  . Smoking status: Never Smoker  . Smokeless tobacco: Never Used  . Alcohol use No  . Drug use:  No  . Sexual activity: Yes    Birth control/ protection: None   Other Topics Concern  . Not on file   Social History Narrative   Lives in [place] with spouse.    Family History  Problem Relation Age of Onset  . Osteoporosis Mother   . Cancer Mother     breast  . Hypertension Mother   . Heart disease Father   . Other Neg Hx     ROS- All systems are reviewed and negative except as per the HPI above  Physical Exam: Vitals:   07/11/16 0936  BP: (!) 160/84  Pulse: 75  Weight: 145 lb (65.8 kg)  Height: 5\' 5"  (1.651 m)    GEN- The patient is well appearing, alert and oriented x 3 today.   Head- normocephalic, atraumatic Eyes-  Sclera clear, conjunctiva pink Ears- hearing intact Oropharynx- clear Neck- supple, no JVP Lymph- no cervical lymphadenopathy Lungs- Clear to ausculation bilaterally, normal work of breathing Heart- Regular rate and rhythm, no murmurs, rubs or gallops, PMI not laterally displaced GI- soft, NT, ND, + BS Extremities- no clubbing, cyanosis, or edema MS- no significant deformity or atrophy Skin- no rash or lesion Psych- euthymic mood, full affect Neuro- strength and sensation are intact  EKG-SR with IRBBB, PAC's, pr int 128 ms, qrs int 96 ms, qtc 453 ms Epic records reviewed  Assessment and Plan: 1. Afib Successful cardioversion 04/23/16 Reassured most recent irregular heart beat has been PAC's, benign Offered a monitor but she declines Pt was reminded that with afib episodes, after she takes Cardizem if does not convert in 20-30 mins, she should try 150 mg of flecainide to try to avoid ER visit Also encouraged to try to stay calm as she has a tendency to become anxious Continue xarelto without missed doses  F/u afib clinic as needed   Lupita LeashDonna C. Matthew Folksarroll, ANP-C [facility] [address] [place], [place] [address] [phone]

## 2016-08-29 ENCOUNTER — Ambulatory Visit (HOSPITAL_COMMUNITY): Payer: 59 | Admitting: Nurse Practitioner

## 2016-09-01 ENCOUNTER — Telehealth (HOSPITAL_COMMUNITY): Payer: Self-pay | Admitting: *Deleted

## 2016-09-01 NOTE — Telephone Encounter (Signed)
Patient called in extremely frustrated over recurrent episodes of afib. She states "it just keeps coming on and off this week, I keep having to lay down to make it go and this is not how I want to live my life." Pt states she hasn't taken any of the PRN medication for breakthrough afib since laying down works but wants to be able to go and do as she wants. She wanted to talk to [name] in regards to possible repeat ablation. Instructed patient I would request scheduler be in touch for appointment to discuss. Pt was appreciative of call.

## 2016-09-14 ENCOUNTER — Encounter (HOSPITAL_COMMUNITY): Payer: Self-pay

## 2016-09-14 ENCOUNTER — Emergency Department (HOSPITAL_COMMUNITY)
Admission: EM | Admit: 2016-09-14 | Discharge: 2016-09-15 | Disposition: A | Discharge status: Home / Self Care | Payer: 59 | Attending: Emergency Medicine | Admitting: Emergency Medicine

## 2016-09-14 DIAGNOSIS — Z791 Long term (current) use of non-steroidal anti-inflammatories (NSAID): Secondary | ICD-10-CM | POA: Insufficient documentation

## 2016-09-14 DIAGNOSIS — I10 Essential (primary) hypertension: Secondary | ICD-10-CM | POA: Insufficient documentation

## 2016-09-14 DIAGNOSIS — Z79899 Other long term (current) drug therapy: Secondary | ICD-10-CM | POA: Diagnosis not present

## 2016-09-14 DIAGNOSIS — R000 Tachycardia, unspecified: Secondary | ICD-10-CM | POA: Diagnosis present

## 2016-09-14 DIAGNOSIS — Z7901 Long term (current) use of anticoagulants: Secondary | ICD-10-CM | POA: Insufficient documentation

## 2016-09-14 DIAGNOSIS — I4891 Unspecified atrial fibrillation: Secondary | ICD-10-CM | POA: Diagnosis not present

## 2016-09-14 LAB — BASIC METABOLIC PANEL
ANION GAP: 8 (ref 5–15)
BUN: 22 mg/dL — AB (ref 6–20)
CALCIUM: 9.6 mg/dL (ref 8.9–10.3)
CO2: 24 mmol/L (ref 22–32)
CREATININE: 0.84 mg/dL (ref 0.44–1.00)
Chloride: 109 mmol/L (ref 101–111)
GFR calc Af Amer: >60 mL/min (ref >60)
GLUCOSE: 115 mg/dL — AB (ref 65–99)
Potassium: 3.3 mmol/L — ABNORMAL LOW (ref 3.5–5.1)
Sodium: 141 mmol/L (ref 135–145)

## 2016-09-14 LAB — CBC WITH DIFFERENTIAL/PLATELET
BASOS ABS: 0 10*3/uL (ref 0.0–0.1)
BASOS PCT: 1 %
EOS ABS: 0.1 10*3/uL (ref 0.0–0.7)
EOS PCT: 1 %
HCT: 44.6 % (ref 36.0–46.0)
Hemoglobin: 15.3 g/dL — ABNORMAL HIGH (ref 12.0–15.0)
Lymphocytes Relative: 27 %
Lymphs Abs: 1.6 10*3/uL (ref 0.7–4.0)
MCH: 30.6 pg (ref 26.0–34.0)
MCHC: 34.3 g/dL (ref 30.0–36.0)
MCV: 89.2 fL (ref 78.0–100.0)
MONO ABS: 0.4 10*3/uL (ref 0.1–1.0)
MONOS PCT: 7 %
Neutro Abs: 3.7 10*3/uL (ref 1.7–7.7)
Neutrophils Relative %: 64 %
PLATELETS: 220 10*3/uL (ref 150–400)
RBC: 5 MIL/uL (ref 3.87–5.11)
RDW: 12.2 % (ref 11.5–15.5)
WBC: 5.8 10*3/uL (ref 4.0–10.5)

## 2016-09-14 MED ORDER — ONDANSETRON HCL 4 MG/2ML IJ SOLN
4.0000 mg | Freq: Once | INTRAMUSCULAR | Status: AC
Start: 2016-09-14 — End: 2016-09-14
  Administered 2016-09-14: 4 mg via INTRAVENOUS
  Filled 2016-09-14: qty 2

## 2016-09-14 MED ORDER — ETOMIDATE 2 MG/ML IV SOLN
10.0000 mg | Freq: Once | INTRAVENOUS | Status: DC
  Filled 2016-09-14: qty 10

## 2016-09-14 MED ORDER — ETOMIDATE 2 MG/ML IV SOLN
INTRAVENOUS | Status: AC | PRN
  Administered 2016-09-14: 5 mg via INTRAVENOUS

## 2016-09-14 MED ORDER — SODIUM CHLORIDE 0.9 % IV BOLUS (SEPSIS)
1000.0000 mL | Freq: Once | INTRAVENOUS | Status: AC
  Administered 2016-09-14: 1000 mL via INTRAVENOUS

## 2016-09-14 NOTE — ED Triage Notes (Signed)
Patient comes from home with tachycardia and hx of a fib.  Initial EMS rate was 200 they gave 6 mg adenosine as dropped to 160s.  Patient is A&Ox4 still stating she is having palpitations.

## 2016-09-14 NOTE — ED Provider Notes (Signed)
[facility]-EMER[facility]ENCY DEPT Provider Note   CSN: [id number] Arrival date & time: 09/14/16  2153     History   Chief Complaint Chief Complaint  Patient presents with  . Tachycardia    HPI Kathryn Arnold is a 71 y.o. female.  Patient has a history of atrial fibrillation. Currently on Cardizem and flecainide. She has been cardioverted in the past with success. Reports feeling herself going into rapid ventricular rate at approximately 2000. Denies any chest pain or shortness of breath. Denies any recent fevers or chills. No nausea, vomiting, diarrhea. No blood in her stool. EMS was called. Was given adenosine 6 mg and route. Still tachycardic into the 150s on arrival.   The history is provided by the patient.  Illness  This is a recurrent problem. The current episode started 1 to 2 hours ago. The problem occurs constantly. The problem has not changed since onset.Pertinent negatives include no chest pain, no abdominal pain, no headaches and no shortness of breath. Treatments tried: adenosine. The treatment provided no relief.    Past Medical History:  Diagnosis Date  . Abnormal Pap smear   . Complication of anesthesia    alittle slow to wake up-stayed drowsy  . 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 HSV-2 (herpes simplex virus 2) infection   . Hypercholesteremia   . Hyperlipidemia with target LDL less than 100   . Osteoporosis, unspecified    Last DEXA 10/2011   . Paroxysmal atrial fibrillation (HCC)    s/p ablation Afib and flutter 11/03/14  . Vitamin D deficiency     Patient Active Problem List   Diagnosis Date Noted  . GAD (generalized anxiety disorder) 01/28/2014  . Essential hypertension, benign 01/20/2014  . Atrial fibrillation (HCC) 12/31/2013  . HSV-2 (herpes simplex virus 2) infection 12/18/2013  . Vitamin D deficiency 12/18/2013  . Hyperlipidemia with target LDL less than 100 08/28/2012  . Osteoporosis 08/28/2012    Past Surgical History:  Procedure Laterality  Date  . BREAST LUMPECTOMY    . DILATION AND CURETTAGE OF UTERUS    . ELECTROPHYSIOLOGIC STUDY N/A 11/03/2014   Procedure: Atrial Fibrillation Ablation;  Surgeon: Hillis Range, MD;  Location: [facility] INVASIVE CV LAB;  Service: Cardiovascular;  Laterality: N/A;  . LAPAROSCOPY ABDOMEN DIAGNOSTIC      OB History    Gravida Para Term Preterm AB Living   2 2 2  0 0 2   SAB TAB Ectopic Multiple Live Births   0 0 0 0         Home Medications    Prior to Admission medications   Medication Sig Start Date End Date Taking? Authorizing Provider  Calcium Carbonate (CALCIUM 600 PO) Take 1 tablet by mouth 2 (two) times daily.    [provider]  Coenzyme Q10 (CO Q 10) 100 MG CAPS Take 1 capsule by mouth daily.    [provider]  diltiazem (CARDIZEM LA) 120 MG 24 hr tablet Take 1 tablet (120 mg total) by mouth daily. 07/04/16   Junie Spencer, FNP  diltiazem (CARDIZEM) 60 MG tablet Take 1/2 to 1 tablet every 4 hours AS NEEDED for heart rate >100 as long as blood pressure >100 05/11/15   Newman Nip, NP  flecainide (TAMBOCOR) 150 MG tablet Take 1 tablet by mouth as needed for afib Patient not taking: Reported on 07/11/2016 05/11/15   Newman Nip, NP  Magnesium 250 MG TABS Take 2 tablets by mouth daily.    [provider]  meloxicam (MOBIC) 7.5 MG tablet Take 1 tablet (7.5 mg total) by mouth daily. 03/10/16   Junie SpencerHawks, Christy A, FNP  Multiple Vitamins-Minerals (CENTRUM SILVER ULTRA WOMENS PO) Take 1 capsule by mouth daily.     [provider]  rivaroxaban (XARELTO) 20 MG TABS tablet Take 1 tablet (20 mg total) by mouth daily with supper. 07/04/16   Junie SpencerHawks, Christy A, FNP  rosuvastatin (CRESTOR) 20 MG tablet Take 1 tablet by mouth  daily 07/04/16   Junie SpencerHawks, Christy A, FNP  valACYclovir (VALTREX) 500 MG tablet Take 1 tablet by mouth  daily 07/04/16   Jannifer RodneyHawks, Christy A, FNP  vitamin C (ASCORBIC ACID) 500 MG tablet Take 500 mg by mouth 2 (two) times daily.     [provider]    Family History Family History  Problem Relation Age of Onset  . Osteoporosis Mother   . Cancer Mother        breast  . Hypertension Mother   . Heart disease Father   . Other Neg Hx     Social History Social History  Substance Use Topics  . Smoking status: Never Smoker  . Smokeless tobacco: Never Used  . Alcohol use No     Allergies   Patient has no known allergies.   Review of Systems Review of Systems  Constitutional: Negative for chills and fever.  Respiratory: Negative for shortness of breath.   Cardiovascular: Positive for palpitations. Negative for chest pain and leg swelling.  Gastrointestinal: Negative for abdominal pain, blood in stool, nausea and vomiting.  Neurological: Negative for light-headedness and headaches.  All other systems reviewed and are negative.    Physical Exam Updated Vital Signs BP (!) 115/104   Pulse 61   Resp 12   SpO2 100%   Physical Exam  Constitutional: She appears well-developed and well-nourished. No distress.  HENT:  Head: Normocephalic and atraumatic.  Eyes: Conjunctivae are normal.  Neck: Neck supple.  Cardiovascular: An irregularly irregular rhythm present. Tachycardia present.   No murmur heard. Pulmonary/Chest: Effort normal and breath sounds normal. No respiratory distress.  Abdominal: Soft. There is no tenderness.  Musculoskeletal: She exhibits no edema.       Right lower leg: She exhibits no swelling and no edema.       Left lower leg: She exhibits no swelling and no edema.  Neurological: She is alert.  Skin: Skin is warm and dry.  Psychiatric: She has a normal mood and affect.  Nursing note and vitals reviewed.    ED Treatments / Results  Labs (all labs ordered are listed, but only abnormal results are displayed) Labs Reviewed  BASIC METABOLIC PANEL - Abnormal; Notable for the following:       Result Value   Potassium 3.3 (*)    Glucose, Bld 115 (*)    BUN 22 (*)    All other  components within normal limits  CBC WITH DIFFERENTIAL/PLATELET - Abnormal; Notable for the following:    Hemoglobin 15.3 (*)    All other components within normal limits    EKG  EKG Interpretation None       Radiology No results found.  Procedures .Cardioversion Date/Time: 09/14/2016 11:14 PM Performed by: Lindalou Hose'ROURKE, Jari Dipasquale Authorized by: Vanetta MuldersZACKOWSKI, SCOTT   Consent:    Consent obtained:  Written   Consent given by:  Patient   Alternatives discussed:  Rate-control medication Pre-procedure details:    Cardioversion basis:  Elective   Rhythm:  Atrial fibrillation Attempt one:  Cardioversion mode:  Synchronous   Waveform:  Biphasic   Shock (Joules):  100   Shock outcome:  Conversion to normal sinus rhythm Post-procedure details:    Patient status:  Awake   Patient tolerance of procedure:  Tolerated well, no immediate complications .Sedation Date/Time: 09/14/2016 11:14 PM Performed by: Lindalou Hose Authorized by: Vanetta Mulders   Consent:    Consent obtained:  Written   Consent given by:  Patient Indications:    Procedure performed:  Cardioversion   Procedure necessitating sedation performed by:  Physician performing sedation   Intended level of sedation:  Moderate (conscious sedation) Pre-sedation assessment:    ASA classification: class 2 - patient with mild systemic disease     Neck mobility: normal     Mouth opening:  3 or more finger widths   Mallampati score:  II - soft palate, uvula, fauces visible Immediate pre-procedure details:    Reviewed: vital signs     Verified: bag valve mask available, emergency equipment available, intubation equipment available, IV patency confirmed and oxygen available   Procedure details (see MAR for exact dosages):    Preoxygenation:  Nasal cannula   Sedation:  Etomidate   Intra-procedure monitoring:  Continuous capnometry, cardiac monitor, continuous pulse oximetry and blood pressure monitoring   Intra-procedure events:  none   Post-procedure details:    Patient tolerance:  Tolerated well, no immediate complications   (including critical care time)  Medications Ordered in ED Medications  etomidate (AMIDATE) injection 10 mg (0 mg Intravenous Hold 09/14/16 2311)  sodium chloride 0.9 % bolus 1,000 mL (1,000 mLs Intravenous New Bag/Given 09/14/16 2236)  ondansetron (ZOFRAN) injection 4 mg (4 mg Intravenous Given 09/14/16 2236)  etomidate (AMIDATE) injection (5 mg Intravenous Given 09/14/16 2246)     Initial Impression / Assessment and Plan / ED Course  I have reviewed the triage vital signs and the nursing notes.  Pertinent labs & imaging results that were available during my care of the patient were reviewed by me and considered in my medical decision making (see chart for details).     Patient tachycardic on arrival here. No acute complaints except for palpitations. EKG consistent with atrial fibrillation with rapid ventricular rate. CBC shows no significant anemia. No leukocytosis. Electrolytes within normal limits. No other identifiable cause for why the patient went into rapid ventricular rate. After discussion with the patient, and proposing electrocardioversion versus rate controlling medications, elected to have electrocardioversion performed. Sedation and procedure as above. Repeat EKG shows normal sinus rhythm. Reevaluated the patient at bedside after sedation had worn off. Back to her baseline mental status. She has no complaints at this time. Feel the patient is appropriate for discharge. Told her to follow up with her cardiologist as soon as possible for reevaluation and possible changes in her medications.  Final Clinical Impressions(s) / ED Diagnoses   Final diagnoses:  Atrial fibrillation with [facility])    New Prescriptions New Prescriptions   No medications on file     Lindalou Hose, MD 09/14/16 1610    Vanetta Mulders, MD 09/15/16 [phone]

## 2016-09-14 NOTE — ED Notes (Signed)
Pt placed on bedpan

## 2016-09-15 ENCOUNTER — Other Ambulatory Visit (HOSPITAL_COMMUNITY): Payer: Self-pay | Admitting: *Deleted

## 2016-09-15 ENCOUNTER — Ambulatory Visit (INDEPENDENT_AMBULATORY_CARE_PROVIDER_SITE_OTHER): Payer: 59 | Admitting: Nurse Practitioner

## 2016-09-15 ENCOUNTER — Telehealth (HOSPITAL_COMMUNITY): Payer: Self-pay | Admitting: *Deleted

## 2016-09-15 ENCOUNTER — Telehealth: Payer: Self-pay | Admitting: *Deleted

## 2016-09-15 ENCOUNTER — Encounter: Payer: Self-pay | Admitting: Nurse Practitioner

## 2016-09-15 VITALS — BP 109/69 | HR 58 | Temp 97.4°F | Ht 65.0 in | Wt 142.2 lb

## 2016-09-15 DIAGNOSIS — I480 Paroxysmal atrial fibrillation: Secondary | ICD-10-CM | POA: Diagnosis not present

## 2016-09-15 MED ORDER — FLECAINIDE ACETATE 50 MG PO TABS: 50.0000 mg | ORAL_TABLET | Freq: Two times a day (BID) | ORAL | 3 refills | Status: DC

## 2016-09-15 MED ORDER — DILTIAZEM HCL 60 MG PO TABS: ORAL_TABLET | ORAL | 1 refills | Status: DC

## 2016-09-15 NOTE — Telephone Encounter (Signed)
Called pt, per [place], [place], [place], [place]  [place], [place]ifer, [place]        The patient is schedule to see me in clinic 5/30 for ER follow up for afib. However, patient has been followed in afib clinic for the past 2 years. I would like her to follow up in afib clinic next week.   Thanks   Vin    Pt has been made aware and I have sent Kennyth ArnoldStacy and Lupita LeashDonna @ the Afib clinic to contact pt to get her an appt.  Pt agreeable with this plan and verbalized understanding.

## 2016-09-15 NOTE — Progress Notes (Signed)
   Subjective:    Patient ID: Kathryn Arnold, female    DOB: 02-01-1946, 71 y.o.   MRN: [id number]  [facility] with rapid heart rate with known atrial fib. She was cardioverted in er under conscious sedation. Is here today for follow up. Today she is doing well today without out feeling heart racing. She has follow up at atrial fib clinic next week and Dr. Johney Arnold June 11,2018    Review of Systems  Constitutional: Negative.   HENT: Negative.   Respiratory: Negative.   Cardiovascular: Negative.   Gastrointestinal: Negative.   Neurological: Negative.   Psychiatric/Behavioral: Negative.   All other systems reviewed and are negative.      Objective:   Physical Exam  Constitutional: She is oriented to person, place, and time. She appears well-developed and well-nourished. No distress.  Cardiovascular: Normal rate and regular rhythm.   Pulmonary/Chest: Effort normal and breath sounds normal.  Neurological: She is alert and oriented to person, place, and time.  Skin: Skin is warm.  Psychiatric: She has a normal mood and affect. Her behavior is normal. Judgment and thought content normal.   BP 109/69   Pulse (!) 58   Temp 97.4 F (36.3 C) (Oral)   Ht 5\' 5"  (1.651 m)   Wt 142 lb 3.2 oz (64.5 kg)   BMI 23.66 kg/m      Assessment & Plan:  1. Paroxysmal atrial fibrillation [facility](HCC) Hospital records reviewed Keep follow up appointment at atrial fib clinic and with DR. ALLred Avoid caffeine RTO prn  Kathryn Daphine DeutscherMartin, FNP

## 2016-09-15 NOTE — Patient Instructions (Signed)

## 2016-09-15 NOTE — ED Notes (Signed)
Patient Alert and oriented X4. Stable and ambulatory. Patient verbalized understanding of the discharge instructions.  Patient belongings were taken by the patient.  

## 2016-09-15 NOTE — Telephone Encounter (Signed)
Patient in ER again last night with resulting cardioversion. Rudi Cocoonna Carroll NP recommended restarting flecainide 50mg  twice a day until she can be re-evaluated for ablation with [name] in June. Pt in agreement with this. Understands not to use PIP 150mg  flecainide dose since she will be on daily flecainide. She can use cardizem PRN for breakthrough afib. Will come in for repeat EKG on 5/30. RX sent to pharmacy of choice.

## 2016-09-20 ENCOUNTER — Ambulatory Visit: Payer: 59 | Admitting: Physician Assistant

## 2016-09-20 ENCOUNTER — Ambulatory Visit (HOSPITAL_COMMUNITY)
Admission: RE | Admit: 2016-09-20 | Discharge: 2016-09-20 | Disposition: A | Discharge status: Home / Self Care | Payer: 59 | Source: Ambulatory Visit | Attending: Nurse Practitioner | Admitting: Nurse Practitioner

## 2016-09-20 ENCOUNTER — Encounter (HOSPITAL_COMMUNITY): Payer: Self-pay | Admitting: Nurse Practitioner

## 2016-09-20 VITALS — BP 142/82 | HR 56 | Ht 65.0 in | Wt 141.0 lb

## 2016-09-20 DIAGNOSIS — R001 Bradycardia, unspecified: Secondary | ICD-10-CM | POA: Diagnosis not present

## 2016-09-20 DIAGNOSIS — E7800 Pure hypercholesterolemia, unspecified: Secondary | ICD-10-CM | POA: Insufficient documentation

## 2016-09-20 DIAGNOSIS — I480 Paroxysmal atrial fibrillation: Secondary | ICD-10-CM | POA: Insufficient documentation

## 2016-09-20 DIAGNOSIS — I4510 Unspecified right bundle-branch block: Secondary | ICD-10-CM | POA: Diagnosis not present

## 2016-09-20 DIAGNOSIS — I4892 Unspecified atrial flutter: Secondary | ICD-10-CM | POA: Insufficient documentation

## 2016-09-20 DIAGNOSIS — Z9889 Other specified postprocedural states: Secondary | ICD-10-CM | POA: Diagnosis not present

## 2016-09-20 DIAGNOSIS — Z7901 Long term (current) use of anticoagulants: Secondary | ICD-10-CM | POA: Insufficient documentation

## 2016-09-20 DIAGNOSIS — I4891 Unspecified atrial fibrillation: Secondary | ICD-10-CM | POA: Diagnosis present

## 2016-09-20 NOTE — Progress Notes (Signed)
Patient ID: Kathryn Arnold, female   DOB: [birth year], 71 y.o.   MRN: [id number]     Primary Care Physician: Junie SpencerHawks, Christy A, FNP Ref[facility] Phys[facility]   Kathryn BillsShirley L Hoh is a 71 y.o. female with a h/o afib s/p PVI and aflutter abaltion 11/03/14 for f/u in the afib clinic. She had been doing well maintaining SR.  She was off flecainide, but does have 150 mg to use as needed for breakthrough afib.  She watches her weight carefully and is normal weight, no tobacco/alcohol/ caffeine use.  She is in the afib clinic today for f/u ER visit 5/24 for afib with [facility]. Pt took 150 mg flecainide but with no response. EMS thought  It was because flecainide  had expired. EKG showed rapid afib. She was cardioverted successfully. Compliant with DOAC. She becomes very anxious with the breakthrough afib. She called the afib clinic after the ER visit and was placed back on daily flecainide at 50 mg bid. She had been having shorter spells of afib at home usually responding to Cardizem. She also sounds like she has spells of  palpitations  She says no further afib. She is pending an appointment with [name] 6/11 to discuss redo ablation.  Today, she denies symptoms of   chest pain, shortness of breath, orthopnea, PND, lower extremity edema, dizziness, presyncope, syncope, or neurologic sequela. The patient is tolerating medications without difficulties and is otherwise without complaint today.   Past Medical History:  Diagnosis Date  . Abnormal Pap smear   . Complication of anesthesia    alittle slow to wake up-stayed drowsy  . 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 HSV-2 (herpes simplex virus 2) infection   . Hypercholesteremia   . Hyperlipidemia with target LDL less than 100   . Osteoporosis, unspecified    Last DEXA 10/2011   . Paroxysmal atrial fibrillation (HCC)    s/p ablation Afib and flutter 11/03/14  . Vitamin D deficiency    Past Surgical History:  Procedure Laterality Date  . BREAST  LUMPECTOMY    . DILATION AND CURETTAGE OF UTERUS    . ELECTROPHYSIOLOGIC STUDY N/A 11/03/2014   Procedure: Atrial Fibrillation Ablation;  Surgeon: Hillis RangeJames Allred, MD;  [facility];  Service: Cardiovascular;  Laterality: N/A;  . LAPAROSCOPY ABDOMEN DIAGNOSTIC      Current Outpatient Prescriptions  Medication Sig Dispense Refill  . Calcium Carbonate (CALCIUM 600 PO) Take 1 tablet by mouth 2 (two) times daily.    . Coenzyme Q10 (CO Q 10) 100 MG CAPS Take 1 capsule by mouth daily.    [name]. diltiazem (CARDIZEM LA) 120 MG 24 hr tablet Take 1 tablet (120 mg total) by mouth daily. 90 tablet 2  . diltiazem (CARDIZEM) 60 MG tablet Take 1/2 to 1 tablet every 4 hours AS NEEDED for heart rate >100 as long as blood pressure >100 45 tablet 1  . flecainide (TAMBOCOR) 50 MG tablet Take 1 tablet (50 mg total) by mouth 2 (two) times daily. 60 tablet 3  . Magnesium 250 MG TABS Take 3 tablets by mouth daily.     . Multiple Vitamins-Minerals (CENTRUM SILVER ULTRA WOMENS PO) Take 1 capsule by mouth daily.     . rivaroxaban (XARELTO) 20 MG TABS tablet Take 1 tablet (20 mg total) by mouth daily with supper. 90 tablet 1  . rosuvastatin (CRESTOR) 20 MG tablet Take 1 tablet by mouth  daily 90 tablet 1  . valACYclovir (VALTREX) 500  MG tablet Take 1 tablet by mouth  daily 90 tablet 2  . vitamin C (ASCORBIC ACID) 500 MG tablet Take 500 mg by mouth 2 (two) times daily.      No current facility-administered medications for this encounter.     No Known Allergies  Social History   Social History  . Marital status: Married    Spouse name: N/A  . Number of children: N/A  . Years of education: N/A   Occupational History  . Not on file.   Social History Main Topics  . Smoking status: Never Smoker  . Smokeless tobacco: Never Used  . Alcohol use No  . Drug use: No  . Sexual activity: Yes    Birth control/ protection: None   Other Topics Concern  . Not on file   Social History Narrative   Lives in  [place] with spouse.    Family History  Problem Relation Age of Onset  . Osteoporosis Mother   . Cancer Mother        breast  . Hypertension Mother   . Heart disease Father   . Other Neg Hx     ROS- All systems are reviewed and negative except as per the HPI above  Physical Exam: Vitals:   09/20/16 0909  BP: (!) 142/82  Pulse: (!) 56  Weight: 141 lb (64 kg)  Height: 5\' 5"  (1.651 m)    GEN- The patient is well appearing, alert and oriented x 3 today.   Head- normocephalic, atraumatic Eyes-  Sclera clear, conjunctiva pink Ears- hearing intact Oropharynx- clear Neck- supple, no JVP Lymph- no cervical lymphadenopathy Lungs- Clear to ausculation bilaterally, normal work of breathing Heart- Regular rate and rhythm, no murmurs, rubs or gallops, PMI not laterally displaced GI- soft, NT, ND, + BS Extremities- no clubbing, cyanosis, or edema MS- no significant deformity or atrophy Skin- no rash or lesion Psych- euthymic mood, full affect Neuro- strength and sensation are intact  EKG-SR with IRBBB, Pr int 180 ms, qrs int 112 ms, qtc 428 ms ( unchanged from baseline) Epic records reviewed  Assessment and Plan: 1. Afib Successful cardioversion  ER 04/23/16 and 09/15/15  Restarted on flecainide 50 mg bid Also encouraged to try to stay calm as she has a tendency to become anxious Continue xarelto without missed doses  F/u with Dr. Johney Frame to discuss repeat ablation  afib clinic as needed   Lupita Leash C. [facility] [address] [place], [place] [address] [phone]

## 2016-10-02 ENCOUNTER — Encounter: Payer: Self-pay | Admitting: Internal Medicine

## 2016-10-02 ENCOUNTER — Ambulatory Visit (INDEPENDENT_AMBULATORY_CARE_PROVIDER_SITE_OTHER): Payer: 59 | Admitting: Internal Medicine

## 2016-10-02 VITALS — BP 136/76 | HR 58 | Ht 65.0 in | Wt 143.0 lb

## 2016-10-02 DIAGNOSIS — I481 Persistent atrial fibrillation: Secondary | ICD-10-CM | POA: Diagnosis not present

## 2016-10-02 DIAGNOSIS — I480 Paroxysmal atrial fibrillation: Secondary | ICD-10-CM | POA: Diagnosis not present

## 2016-10-02 DIAGNOSIS — I4819 Other persistent atrial fibrillation: Secondary | ICD-10-CM

## 2016-10-02 NOTE — Patient Instructions (Addendum)
Medication Instructions:  Your physician recommends that you continue on your current medications as directed. Please refer to the Current Medication list given to you today.   Labwork: Your physician recommends that you return for lab work on 7/02 at 9:00am---do not have to fast   Testing/Procedures:   Your physician has requested that you have an echocardiogram. Echocardiography is a painless test that uses sound waves to create images of your heart. It provides your doctor with information about the size and shape of your heart and how well your heart's chambers and valves are working. This procedure takes approximately one hour. There are no restrictions for this procedure.   Your physician has requested that you have a TEE. During a TEE, sound waves are used to create images of your heart. It provides your doctor with information about the size and shape of your heart and how well your heart's chambers and valves are working. In this test, a transducer is attached to the end of a flexible tube that's guided down your throat and into your esophagus (the tube leading from you mouth to your stomach) to get a more detailed image of your heart. You are not awake for the procedure. Please see the instruction sheet given to you today. For further information please visit [email or web address].---10/30/16  [facility] at 6:30am Do not eat or drink after midnight the night prior to the procedure Do not take any medications the morning of the test Will need someone to drive you home at discharge  Your physician has recommended that you have an ablation. Catheter ablation is a medical procedure used to treat some cardiac arrhythmias (irregular heartbeats). During catheter ablation, a long, thin, flexible tube is put into a blood vessel in your groin (upper thigh), or neck. This tube is called an ablation catheter. It is then guided to your heart  through the blood vessel. Radio frequency waves destroy small areas of heart tissue where abnormal heartbeats may cause an arrhythmia to start. Please see the instruction sheet given to you today.---10/31/16  [facility] at 5:30am Do not eat or drink after midnight the night prior to the procedure Do not take any medications the morning of the test Plan for one night stay Will need someone to drive you home at discharge      Follow-Up: Your physician recommends that you schedule a follow-up appointment in: 4 weeks from 10/31/16 with Rudi Cocoonna Carroll, NP in afib clinic and 3 months from 10/31/16 with Dr Johney FrameAllred    Any Other Special Instructions Will Be Listed Below (If Applicable).     If you need a refill on your cardiac medications before your next appointment, please call your pharmacy.

## 2016-10-02 NOTE — Progress Notes (Signed)
PCP: Junie Spencer, FNP Primary Cardiologist: none  Kathryn Arnold is a 71 y.o. female who presents today for routine electrophysiology followup.  She did very well post afib ablation 09/2014.  Unfortunately, she required cardioversion 12/17 and again more recently in May of this year.   She has had ongoing intermittent episodes as well despite taking flecainide 50mg  BID.  She has fatigue and SOB during her afib.    Today, she denies symptoms of palpitations, chest pain, shortness of breath,  lower extremity edema, dizziness, presyncope, or syncope.  The patient is otherwise without complaint today.   Past Medical History:  Diagnosis Date  . Abnormal Pap smear   . Complication of anesthesia    alittle slow to wake up-stayed drowsy  . 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 HSV-2 (herpes simplex virus 2) infection   . Hypercholesteremia   . Hyperlipidemia with target LDL less than 100   . Osteoporosis, unspecified    Last DEXA 10/2011   . Paroxysmal atrial fibrillation (HCC)    s/p ablation Afib and flutter 11/03/14  . Vitamin D deficiency    Past Surgical History:  Procedure Laterality Date  . BREAST LUMPECTOMY    . DILATION AND CURETTAGE OF UTERUS    . ELECTROPHYSIOLOGIC STUDY N/A 11/03/2014   Procedure: Atrial Fibrillation Ablation;  Surgeon: Hillis Range, MD;  Location: [facility] INVASIVE CV LAB;  Service: Cardiovascular;  Laterality: N/A;  . LAPAROSCOPY ABDOMEN DIAGNOSTIC      ROS- all systems are reviewed and negatives except as per HPI above  Current Outpatient Prescriptions  Medication Sig Dispense Refill  . diltiazem (CARDIZEM LA) 120 MG 24 hr tablet Take 1 tablet (120 mg total) by mouth daily. 90 tablet 2  . diltiazem (CARDIZEM) 60 MG tablet Take 1/2 to 1 tablet by mouth every 4 hours AS NEEDED for heart rate >100 as long as blood pressure >100    . flecainide (TAMBOCOR) 50 MG tablet Take 1 tablet (50 mg total) by mouth 2 (two) times daily. 60 tablet 3  . rivaroxaban  (XARELTO) 20 MG TABS tablet Take 1 tablet (20 mg total) by mouth daily with supper. 90 tablet 1  . rosuvastatin (CRESTOR) 20 MG tablet Take 1 tablet by mouth  daily 90 tablet 1  . valACYclovir (VALTREX) 500 MG tablet Take 1 tablet by mouth  daily 90 tablet 2   No current facility-administered medications for this visit.     Physical Exam: Vitals:   10/02/16 0923  BP: 136/76  Pulse: (!) 58  SpO2: 98%  Weight: 143 lb (64.9 kg)  Height: 5\' 5"  (1.651 m)    GEN- The patient is well appearing, alert and oriented x 3 today.   Head- normocephalic, atraumatic Eyes-  Sclera clear, conjunctiva pink Ears- hearing intact Oropharynx- clear Lungs- Clear to ausculation bilaterally, normal work of breathing Heart- Regular rate and rhythm, no murmurs, rubs or gallops, PMI not laterally displaced GI- soft, NT, ND, + BS Extremities- no clubbing, cyanosis, or edema  EKG tracing ordered today is personally reviewed and shows sinus rhythm 58 bpm, PR 180 msce, incomplete RBBB  Assessment and Plan:  1. Persistent afib The patient has recurrent symptomatic persistent afib post ablation despite medical therapy with flecainide.] Therapeutic strategies for afib including medicine and repeat ablation were discussed in detail with the patient today. Risk, benefits, and alternatives to EP study and radiofrequency ablation for afib were also discussed in detail today. These risks include but are not  limited to stroke, bleeding, vascular damage, tamponade, perforation, damage to the esophagus, lungs, and other structures, pulmonary vein stenosis, worsening renal function, and death. The patient understands these risk and wishes to proceed.  We will therefore proceed with catheter ablation at the next available time.  Will plan TEE prior to ablation. chads2vasc score is 2.  Continue on xarelto Update echo to evaluate for structural changes with afib   Hillis RangeJames Javontae Marlette MD, [facility] 10/02/2016 9:43 AM

## 2016-10-05 ENCOUNTER — Telehealth (HOSPITAL_COMMUNITY): Payer: Self-pay | Admitting: *Deleted

## 2016-10-05 NOTE — Telephone Encounter (Signed)
Patient called in stating she went into afib this morning at 10am. HR is running in the 150-170s she had taken her morning medications and an extra cardizem 60mg  before calling. Discussed with Rudi Cocoonna Carroll NP will take an extra 50mg  of flecainide at noon. Called back at 3:30pm - still in afib but rate is better controlled in the 59-150 range. Pt just feels worn out but BP is 140/60s. Instructed pt to continue PRN cardizem every 4 hours for heart rate over 100 and take normal dose of flecainide tonight. If still in afib tomorrow will call back. ER precautions reviewed.

## 2016-10-23 ENCOUNTER — Ambulatory Visit (HOSPITAL_COMMUNITY): Payer: 59 | Attending: Internal Medicine

## 2016-10-23 ENCOUNTER — Other Ambulatory Visit: Payer: 59 | Admitting: *Deleted

## 2016-10-23 ENCOUNTER — Other Ambulatory Visit: Payer: Self-pay

## 2016-10-23 DIAGNOSIS — I480 Paroxysmal atrial fibrillation: Secondary | ICD-10-CM

## 2016-10-23 DIAGNOSIS — I361 Nonrheumatic tricuspid (valve) insufficiency: Secondary | ICD-10-CM | POA: Diagnosis not present

## 2016-10-23 LAB — CBC WITH DIFFERENTIAL/PLATELET
BASOS ABS: 0 10*3/uL (ref 0.0–0.2)
Basos: 1 %
EOS (ABSOLUTE): 0.1 10*3/uL (ref 0.0–0.4)
EOS: 1 %
HEMATOCRIT: 41 % (ref 34.0–46.6)
HEMOGLOBIN: 14.4 g/dL (ref 11.1–15.9)
IMMATURE GRANULOCYTES: 0 %
Immature Grans (Abs): 0 10*3/uL (ref 0.0–0.1)
Lymphocytes Absolute: 1.4 10*3/uL (ref 0.7–3.1)
Lymphs: 34 %
MCH: 30.4 pg (ref 26.6–33.0)
MCHC: 35.1 g/dL (ref 31.5–35.7)
MCV: 87 fL (ref 79–97)
MONOCYTES: 9 %
MONOS ABS: 0.4 10*3/uL (ref 0.1–0.9)
NEUTROS PCT: 55 %
Neutrophils Absolute: 2.3 10*3/uL (ref 1.4–7.0)
Platelets: 242 10*3/uL (ref 150–379)
RBC: 4.73 x10E6/uL (ref 3.77–5.28)
RDW: 12.1 % — AB (ref 12.3–15.4)
WBC: 4.1 10*3/uL (ref 3.4–10.8)

## 2016-10-23 LAB — BASIC METABOLIC PANEL
BUN/Creatinine Ratio: 20 (ref 12–28)
BUN: 14 mg/dL (ref 8–27)
CHLORIDE: 104 mmol/L (ref 96–106)
CO2: 24 mmol/L (ref 20–29)
CREATININE: 0.7 mg/dL (ref 0.57–1.00)
Calcium: 9.7 mg/dL (ref 8.7–10.3)
GFR calc Af Amer: 103 mL/min/{1.73_m2} (ref >59)
GFR calc non Af Amer: 89 mL/min/{1.73_m2} (ref >59)
GLUCOSE: 91 mg/dL (ref 65–99)
Potassium: 4.6 mmol/L (ref 3.5–5.2)
Sodium: 143 mmol/L (ref 134–144)

## 2016-10-30 ENCOUNTER — Ambulatory Visit (HOSPITAL_BASED_OUTPATIENT_CLINIC_OR_DEPARTMENT_OTHER)
Admission: RE | Admit: 2016-10-30 | Discharge: 2016-10-30 | Disposition: A | Discharge status: Home / Self Care | Payer: 59 | Source: Ambulatory Visit | Attending: Nurse Practitioner | Admitting: Nurse Practitioner

## 2016-10-30 ENCOUNTER — Ambulatory Visit (HOSPITAL_COMMUNITY)
Admission: RE | Admit: 2016-10-30 | Discharge: 2016-10-30 | Disposition: A | Discharge status: Home / Self Care | Payer: 59 | Source: Ambulatory Visit | Attending: Cardiovascular Disease | Admitting: Cardiovascular Disease

## 2016-10-30 ENCOUNTER — Encounter (HOSPITAL_COMMUNITY): Payer: Self-pay

## 2016-10-30 ENCOUNTER — Encounter (HOSPITAL_COMMUNITY)
Admission: RE | Disposition: A | Discharge status: Home / Self Care | Payer: Self-pay | Source: Ambulatory Visit | Attending: Cardiovascular Disease

## 2016-10-30 DIAGNOSIS — I481 Persistent atrial fibrillation: Secondary | ICD-10-CM | POA: Insufficient documentation

## 2016-10-30 DIAGNOSIS — I4510 Unspecified right bundle-branch block: Secondary | ICD-10-CM | POA: Insufficient documentation

## 2016-10-30 DIAGNOSIS — I4891 Unspecified atrial fibrillation: Secondary | ICD-10-CM

## 2016-10-30 DIAGNOSIS — E7800 Pure hypercholesterolemia, unspecified: Secondary | ICD-10-CM | POA: Insufficient documentation

## 2016-10-30 DIAGNOSIS — Z7901 Long term (current) use of anticoagulants: Secondary | ICD-10-CM | POA: Insufficient documentation

## 2016-10-30 DIAGNOSIS — Z79899 Other long term (current) drug therapy: Secondary | ICD-10-CM | POA: Insufficient documentation

## 2016-10-30 DIAGNOSIS — I340 Nonrheumatic mitral (valve) insufficiency: Secondary | ICD-10-CM | POA: Diagnosis not present

## 2016-10-30 DIAGNOSIS — I480 Paroxysmal atrial fibrillation: Secondary | ICD-10-CM

## 2016-10-30 HISTORY — PX: TEE WITHOUT CARDIOVERSION: SHX5443

## 2016-10-30 SURGERY — ECHOCARDIOGRAM, TRANSESOPHAGEAL
Anesthesia: Moderate Sedation

## 2016-10-30 MED ORDER — DIPHENHYDRAMINE HCL 50 MG/ML IJ SOLN
INTRAMUSCULAR | Status: DC | PRN
  Administered 2016-10-30: 25 mg via INTRAVENOUS

## 2016-10-30 MED ORDER — SODIUM CHLORIDE 0.9 % IV SOLN
INTRAVENOUS | Status: DC
  Administered 2016-10-30: 500 mL via INTRAVENOUS

## 2016-10-30 MED ORDER — FENTANYL CITRATE (PF) 100 MCG/2ML IJ SOLN
INTRAMUSCULAR | Status: AC
  Filled 2016-10-30: qty 2

## 2016-10-30 MED ORDER — MIDAZOLAM HCL 10 MG/2ML IJ SOLN
INTRAMUSCULAR | Status: DC | PRN
  Administered 2016-10-30 (×2): 1 mg via INTRAVENOUS
  Administered 2016-10-30: 2 mg via INTRAVENOUS

## 2016-10-30 MED ORDER — BUTAMBEN-TETRACAINE-BENZOCAINE 2-2-14 % EX AERO
INHALATION_SPRAY | CUTANEOUS | Status: DC | PRN
  Administered 2016-10-30: 2 via TOPICAL

## 2016-10-30 MED ORDER — MIDAZOLAM HCL 5 MG/ML IJ SOLN
INTRAMUSCULAR | Status: AC
  Filled 2016-10-30: qty 2

## 2016-10-30 MED ORDER — FENTANYL CITRATE (PF) 100 MCG/2ML IJ SOLN
INTRAMUSCULAR | Status: DC | PRN
  Administered 2016-10-30 (×2): 25 ug via INTRAVENOUS

## 2016-10-30 NOTE — Progress Notes (Signed)
  Echocardiogram Echocardiogram Transesophageal has been performed.  Kathryn Arnold T Jayme Mednick 10/30/2016, 8:36 AM

## 2016-10-30 NOTE — Discharge Instructions (Signed)

## 2016-10-30 NOTE — H&P (View-Only) (Signed)
PCP: Junie Spencer, FNP Primary Cardiologist: none  Kathryn Arnold is a 71 y.o. female who presents today for routine electrophysiology followup.  She did very well post afib ablation 09/2014.  Unfortunately, she required cardioversion 12/17 and again more recently in May of this year.   She has had ongoing intermittent episodes as well despite taking flecainide 50mg  BID.  She has fatigue and SOB during her afib.    Today, she denies symptoms of palpitations, chest pain, shortness of breath,  lower extremity edema, dizziness, presyncope, or syncope.  The patient is otherwise without complaint today.   Past Medical History:  Diagnosis Date  . Abnormal Pap smear   . Complication of anesthesia    alittle slow to wake up-stayed drowsy  . 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 HSV-2 (herpes simplex virus 2) infection   . Hypercholesteremia   . Hyperlipidemia with target LDL less than 100   . Osteoporosis, unspecified    Last DEXA 10/2011   . Paroxysmal atrial fibrillation (HCC)    s/p ablation Afib and flutter 11/03/14  . Vitamin D deficiency    Past Surgical History:  Procedure Laterality Date  . BREAST LUMPECTOMY    . DILATION AND CURETTAGE OF UTERUS    . ELECTROPHYSIOLOGIC STUDY N/A 11/03/2014   Procedure: Atrial Fibrillation Ablation;  Surgeon: Hillis Range, MD;  Location: [facility] INVASIVE CV LAB;  Service: Cardiovascular;  Laterality: N/A;  . LAPAROSCOPY ABDOMEN DIAGNOSTIC      ROS- all systems are reviewed and negatives except as per HPI above  Current Outpatient Prescriptions  Medication Sig Dispense Refill  . diltiazem (CARDIZEM LA) 120 MG 24 hr tablet Take 1 tablet (120 mg total) by mouth daily. 90 tablet 2  . diltiazem (CARDIZEM) 60 MG tablet Take 1/2 to 1 tablet by mouth every 4 hours AS NEEDED for heart rate >100 as long as blood pressure >100    . flecainide (TAMBOCOR) 50 MG tablet Take 1 tablet (50 mg total) by mouth 2 (two) times daily. 60 tablet 3  . rivaroxaban  (XARELTO) 20 MG TABS tablet Take 1 tablet (20 mg total) by mouth daily with supper. 90 tablet 1  . rosuvastatin (CRESTOR) 20 MG tablet Take 1 tablet by mouth  daily 90 tablet 1  . valACYclovir (VALTREX) 500 MG tablet Take 1 tablet by mouth  daily 90 tablet 2   No current facility-administered medications for this visit.     Physical Exam: Vitals:   10/02/16 0923  BP: 136/76  Pulse: (!) 58  SpO2: 98%  Weight: 143 lb (64.9 kg)  Height: 5\' 5"  (1.651 m)    GEN- The patient is well appearing, alert and oriented x 3 today.   Head- normocephalic, atraumatic Eyes-  Sclera clear, conjunctiva pink Ears- hearing intact Oropharynx- clear Lungs- Clear to ausculation bilaterally, normal work of breathing Heart- Regular rate and rhythm, no murmurs, rubs or gallops, PMI not laterally displaced GI- soft, NT, ND, + BS Extremities- no clubbing, cyanosis, or edema  EKG tracing ordered today is personally reviewed and shows sinus rhythm 58 bpm, PR 180 msce, incomplete RBBB  Assessment and Plan:  1. Persistent afib The patient has recurrent symptomatic persistent afib post ablation despite medical therapy with flecainide.] Therapeutic strategies for afib including medicine and repeat ablation were discussed in detail with the patient today. Risk, benefits, and alternatives to EP study and radiofrequency ablation for afib were also discussed in detail today. These risks include but are not  limited to stroke, bleeding, vascular damage, tamponade, perforation, damage to the esophagus, lungs, and other structures, pulmonary vein stenosis, worsening renal function, and death. The patient understands these risk and wishes to proceed.  We will therefore proceed with catheter ablation at the next available time.  Will plan TEE prior to ablation. chads2vasc score is 2.  Continue on xarelto Update echo to evaluate for structural changes with afib   Hillis RangeJames Terelle Dobler MD, [facility] 10/02/2016 9:43 AM

## 2016-10-30 NOTE — Op Note (Signed)
INDICATIONS: atrialo fibrillation pre-ablation  PROCEDURE:   Informed consent was obtained prior to the procedure. The risks, benefits and alternatives for the procedure were discussed and the patient comprehended these risks.  Risks include, but are not limited to, cough, sore throat, vomiting, nausea, somnolence, esophageal and stomach trauma or perforation, bleeding, low blood pressure, aspiration, pneumonia, infection, trauma to the teeth and death.    After a procedural time-out, the oropharynx was anesthetized with 20% benzocaine spray.   During this procedure the patient was administered a total of Versed 4 mg and Fentanyl 75 mg to achieve and maintain moderate conscious sedation.  The patient's heart rate, blood pressure, and oxygen saturationweare monitored continuously during the procedure. The period of conscious sedation was 13 minutes, of which I was present face-to-face 100% of this time.  The transesophageal probe was inserted in the esophagus and stomach without difficulty and multiple views were obtained.  The patient was kept under observation until the patient left the procedure room.  The patient left the procedure room in stable condition.   Agitated microbubble saline contrast was not administered.  COMPLICATIONS:    There were no immediate complications.  FINDINGS:   No LA thrombus. Normal LVEF. Biatrial dilation, mild. Mild MR. 2+ TR. Trivial AI.  RECOMMENDATIONS:   Proceed with ablation.  Time Spent Directly with the Patient:  30 minutes   Kristofor Michalowski 10/30/2016, 8:14 AM

## 2016-10-30 NOTE — Interval H&P Note (Signed)
History and Physical Interval Note:  10/30/2016 8:09 AM  Kathryn Arnold  has presented today for surgery, with the diagnosis of A-FIB  The various methods of treatment have been discussed with the patient and family. After consideration of risks, benefits and other options for treatment, the patient has consented to  Procedure(s): TRANSESOPHAGEAL ECHOCARDIOGRAM (TEE) (N/A) as a surgical intervention .  The patient's history has been reviewed, patient examined, no change in status, stable for surgery.  I have reviewed the patient's chart and labs.  Questions were answered to the patient's satisfaction.     Maevyn Riordan

## 2016-10-31 ENCOUNTER — Encounter (HOSPITAL_COMMUNITY)
Admission: RE | Disposition: A | Discharge status: Home / Self Care | Payer: Self-pay | Source: Ambulatory Visit | Attending: Internal Medicine

## 2016-10-31 ENCOUNTER — Ambulatory Visit (HOSPITAL_COMMUNITY): Payer: 59 | Admitting: Certified Registered Nurse Anesthetist

## 2016-10-31 ENCOUNTER — Ambulatory Visit (HOSPITAL_COMMUNITY)
Admission: RE | Admit: 2016-10-31 | Discharge: 2016-11-01 | Disposition: A | Discharge status: Home / Self Care | Payer: 59 | Source: Ambulatory Visit | Attending: Internal Medicine | Admitting: Internal Medicine

## 2016-10-31 ENCOUNTER — Encounter (HOSPITAL_COMMUNITY): Payer: Self-pay | Admitting: Certified Registered Nurse Anesthetist

## 2016-10-31 DIAGNOSIS — I471 Supraventricular tachycardia: Secondary | ICD-10-CM

## 2016-10-31 DIAGNOSIS — I484 Atypical atrial flutter: Secondary | ICD-10-CM | POA: Diagnosis not present

## 2016-10-31 DIAGNOSIS — Z7901 Long term (current) use of anticoagulants: Secondary | ICD-10-CM | POA: Diagnosis not present

## 2016-10-31 DIAGNOSIS — I481 Persistent atrial fibrillation: Secondary | ICD-10-CM | POA: Insufficient documentation

## 2016-10-31 DIAGNOSIS — E559 Vitamin D deficiency, unspecified: Secondary | ICD-10-CM | POA: Insufficient documentation

## 2016-10-31 DIAGNOSIS — I10 Essential (primary) hypertension: Secondary | ICD-10-CM | POA: Insufficient documentation

## 2016-10-31 DIAGNOSIS — M810 Age-related osteoporosis without current pathological fracture: Secondary | ICD-10-CM | POA: Diagnosis not present

## 2016-10-31 DIAGNOSIS — I480 Paroxysmal atrial fibrillation: Secondary | ICD-10-CM | POA: Diagnosis not present

## 2016-10-31 DIAGNOSIS — E7800 Pure hypercholesterolemia, unspecified: Secondary | ICD-10-CM | POA: Diagnosis not present

## 2016-10-31 HISTORY — DX: Essential (primary) hypertension: I10

## 2016-10-31 HISTORY — DX: Other specified postprocedural states: Z98.890

## 2016-10-31 HISTORY — DX: Unspecified osteoarthritis, unspecified site: M19.90

## 2016-10-31 HISTORY — PX: ATRIAL FIBRILLATION ABLATION: EP1191

## 2016-10-31 HISTORY — DX: Nausea with vomiting, unspecified: R11.2

## 2016-10-31 LAB — POCT ACTIVATED CLOTTING TIME
Activated Clotting Time: 274 seconds
Activated Clotting Time: 169 seconds
Activated Clotting Time: 313 seconds

## 2016-10-31 SURGERY — ATRIAL FIBRILLATION ABLATION
Anesthesia: Monitor Anesthesia Care

## 2016-10-31 MED ORDER — IOPAMIDOL (ISOVUE-370) INJECTION 76%
INTRAVENOUS | Status: DC | PRN
  Administered 2016-10-31: 3 mL via INTRAVENOUS

## 2016-10-31 MED ORDER — BUPIVACAINE HCL (PF) 0.25 % IJ SOLN
INTRAMUSCULAR | Status: AC
  Filled 2016-10-31: qty 30

## 2016-10-31 MED ORDER — SODIUM CHLORIDE 0.9 % IV SOLN
INTRAVENOUS | Status: DC
  Administered 2016-10-31 (×2): via INTRAVENOUS

## 2016-10-31 MED ORDER — ONDANSETRON HCL 4 MG/2ML IJ SOLN
INTRAMUSCULAR | Status: DC | PRN
  Administered 2016-10-31: 4 mg via INTRAVENOUS

## 2016-10-31 MED ORDER — GLYCOPYRROLATE 0.2 MG/ML IJ SOLN
INTRAMUSCULAR | Status: DC | PRN
  Administered 2016-10-31: 0.2 mg via INTRAVENOUS

## 2016-10-31 MED ORDER — MIDAZOLAM HCL 2 MG/2ML IJ SOLN
INTRAMUSCULAR | Status: DC | PRN
  Administered 2016-10-31 (×2): 2 mg via INTRAVENOUS

## 2016-10-31 MED ORDER — SODIUM CHLORIDE 0.9 % IV SOLN: 250.0000 mL | INTRAVENOUS | Status: DC | PRN

## 2016-10-31 MED ORDER — IOPAMIDOL (ISOVUE-370) INJECTION 76%
INTRAVENOUS | Status: AC
  Filled 2016-10-31: qty 50

## 2016-10-31 MED ORDER — HEPARIN SODIUM (PORCINE) 1000 UNIT/ML IJ SOLN
INTRAMUSCULAR | Status: AC
  Filled 2016-10-31: qty 1

## 2016-10-31 MED ORDER — RIVAROXABAN 20 MG PO TABS
20.0000 mg | ORAL_TABLET | Freq: Every day | ORAL | Status: DC
  Administered 2016-10-31: 20:00:00 20 mg via ORAL
  Filled 2016-10-31: qty 1

## 2016-10-31 MED ORDER — BUPIVACAINE HCL (PF) 0.25 % IJ SOLN
INTRAMUSCULAR | Status: DC | PRN
  Administered 2016-10-31: 20 mL

## 2016-10-31 MED ORDER — ISOPROTERENOL HCL 0.2 MG/ML IJ SOLN
INTRAMUSCULAR | Status: AC
  Filled 2016-10-31: qty 5

## 2016-10-31 MED ORDER — HEPARIN (PORCINE) IN NACL 2-0.9 UNIT/ML-% IJ SOLN
INTRAMUSCULAR | Status: AC | PRN
  Administered 2016-10-31: 500 mL

## 2016-10-31 MED ORDER — PROPOFOL 500 MG/50ML IV EMUL
INTRAVENOUS | Status: DC | PRN
  Administered 2016-10-31: 75 ug/kg/min via INTRAVENOUS

## 2016-10-31 MED ORDER — ACETAMINOPHEN 325 MG PO TABS: 650.0000 mg | ORAL_TABLET | ORAL | Status: DC | PRN

## 2016-10-31 MED ORDER — PROTAMINE SULFATE 10 MG/ML IV SOLN
INTRAVENOUS | Status: DC | PRN
  Administered 2016-10-31: 30 mg via INTRAVENOUS

## 2016-10-31 MED ORDER — EPHEDRINE SULFATE 50 MG/ML IJ SOLN
INTRAMUSCULAR | Status: DC | PRN
  Administered 2016-10-31: 10 mg via INTRAVENOUS

## 2016-10-31 MED ORDER — FENTANYL CITRATE (PF) 100 MCG/2ML IJ SOLN
INTRAMUSCULAR | Status: DC | PRN
Start: 2016-10-31 — End: 2016-10-31
  Administered 2016-10-31: 25 ug via INTRAVENOUS
  Administered 2016-10-31: 50 ug via INTRAVENOUS
  Administered 2016-10-31 (×3): 25 ug via INTRAVENOUS
  Administered 2016-10-31: 50 ug via INTRAVENOUS

## 2016-10-31 MED ORDER — HEPARIN (PORCINE) IN NACL 2-0.9 UNIT/ML-% IJ SOLN
INTRAMUSCULAR | Status: AC
  Filled 2016-10-31: qty 500

## 2016-10-31 MED ORDER — VALACYCLOVIR HCL 500 MG PO TABS
500.0000 mg | ORAL_TABLET | Freq: Every day | ORAL | Status: DC
Start: 2016-10-31 — End: 2016-11-01
  Filled 2016-10-31 (×2): qty 1

## 2016-10-31 MED ORDER — SODIUM CHLORIDE 0.9% FLUSH: 3.0000 mL | INTRAVENOUS | Status: DC | PRN

## 2016-10-31 MED ORDER — HEPARIN SODIUM (PORCINE) 1000 UNIT/ML IJ SOLN
INTRAMUSCULAR | Status: DC | PRN
  Administered 2016-10-31: 3000 [IU] via INTRAVENOUS

## 2016-10-31 MED ORDER — HYDROCODONE-ACETAMINOPHEN 5-325 MG PO TABS: 1.0000 | ORAL_TABLET | ORAL | Status: DC | PRN

## 2016-10-31 MED ORDER — ONDANSETRON HCL 4 MG/2ML IJ SOLN: 4.0000 mg | Freq: Four times a day (QID) | INTRAMUSCULAR | Status: DC | PRN

## 2016-10-31 MED ORDER — ISOPROTERENOL HCL 0.2 MG/ML IJ SOLN
INTRAVENOUS | Status: DC | PRN
  Administered 2016-10-31: 20 ug/min via INTRAVENOUS

## 2016-10-31 MED ORDER — LIDOCAINE HCL (CARDIAC) 20 MG/ML IV SOLN
INTRAVENOUS | Status: DC | PRN
Start: 2016-10-31 — End: 2016-10-31
  Administered 2016-10-31: 60 mg via INTRAVENOUS

## 2016-10-31 MED ORDER — PROPOFOL 10 MG/ML IV BOLUS
INTRAVENOUS | Status: DC | PRN
  Administered 2016-10-31: 20 mg via INTRAVENOUS

## 2016-10-31 MED ORDER — SODIUM CHLORIDE 0.9% FLUSH
3.0000 mL | Freq: Two times a day (BID) | INTRAVENOUS | Status: DC
  Administered 2016-10-31: 3 mL via INTRAVENOUS

## 2016-10-31 MED ORDER — HEPARIN SODIUM (PORCINE) 1000 UNIT/ML IJ SOLN
INTRAMUSCULAR | Status: DC | PRN
  Administered 2016-10-31: 1000 [IU] via INTRAVENOUS
  Administered 2016-10-31: 12000 [IU] via INTRAVENOUS

## 2016-10-31 SURGICAL SUPPLY — 18 items
BAG SNAP BAND KOVER 36X36 (MISCELLANEOUS) ×3 IMPLANT
BLANKET WARM UNDERBOD FULL ACC (MISCELLANEOUS) ×3 IMPLANT
CATH MAPPNG PENTARAY F 2-6-2MM (CATHETERS) ×1 IMPLANT
CATH NAVISTAR SMARTTOUCH DF (ABLATOR) ×3 IMPLANT
CATH SOUNDSTAR 3D IMAGING (CATHETERS) ×3 IMPLANT
CATH WEBSTER BI DIR CS D-F CRV (CATHETERS) ×3 IMPLANT
COVER SWIFTLINK CONNECTOR (BAG) ×3 IMPLANT
NEEDLE TRANSEP BRK 71CM 407200 (NEEDLE) ×3 IMPLANT
PACK EP LATEX FREE (CUSTOM PROCEDURE TRAY) ×2
PACK EP LF (CUSTOM PROCEDURE TRAY) ×1 IMPLANT
PAD DEFIB LIFELINK (PAD) ×3 IMPLANT
PATCH CARTO3 (PAD) ×3 IMPLANT
PENTARAY F 2-6-2MM (CATHETERS) ×3
SHEATH AVANTI 11F 11CM (SHEATH) ×3 IMPLANT
SHEATH PINNACLE 7F 10CM (SHEATH) ×6 IMPLANT
SHEATH PINNACLE 9F 10CM (SHEATH) ×3 IMPLANT
SHEATH SWARTZ TS SL2 63CM 8.5F (SHEATH) ×3 IMPLANT
TUBING SMART ABLATE COOLFLOW (TUBING) ×3 IMPLANT

## 2016-10-31 NOTE — Progress Notes (Signed)
Site area: 3 rt fv sheaths Site Prior to Removal:  Level 0 Pressure Applied For: 20 minutes Manual:   yes Patient Status During Pull:  stable Post Pull Site:  Level 0 Post Pull Instructions Given:  yes Post Pull Pulses Present: palpable Dressing Applied:  Gauze and tegaderm Bedrest begins @ 1055 Comments: IV saline locked

## 2016-10-31 NOTE — Progress Notes (Signed)
Patient complained of tenderness at right groin site with light palpation, area soft and no difference when compared to left area. [name] notified when into see patient. Patient up to bathroom and sitting up for dinner in chair. No change in site noted. Dressing dry and intact and area soft.

## 2016-10-31 NOTE — Discharge Instructions (Addendum)
No driving for 4 days. No lifting over 5 lbs for 1 week. No vigorous or sexual activity for 1 week. You may return to work on 11/07/16. Keep procedure site clean & dry. If you notice increased pain, swelling, bleeding or pus, call/return!  You may shower, but no soaking baths/hot tubs/pools for 1 week.     You have an appointment set up with the Atrial Fibrillation Clinic.  Multiple studies have shown that being followed by a dedicated atrial fibrillation clinic in addition to the standard care you receive from your other physicians improves health. We believe that enrollment in the atrial fibrillation clinic will allow us to better care for you.   The phone number to the Atrial Fibrillation Clinic is [phone]. The clinic is staffed Monday through Friday from 8:30am to 5pm.  Parking Directions: The clinic is located in the Heart and Vascular Building [facility]. 1)From [address] and go to the 3rd entrance  (Heart and Vascular entrance) on the right. 2)Look to the right for Heart &Vascular Parking Garage. 3)A code for the entrance is required please call the clinic to receive this.   4)Take the elevators to the 1st floor. Registration is in the room with the glass walls at the end of the hallway.  If you have any trouble parking or locating the clinic, please dont hesitate to call [phone].    Information on my medicine - XARELTO (Rivaroxaban)  This medication education was reviewed with me or my healthcare representative as part of my discharge preparation.  The pharmacist that spoke with me during my hospital stay was:  Pasty Spillersobertson, Crystal Stillinger, [facility]  Why was Xarelto prescribed for you? Xarelto was prescribed for you to reduce the risk of a blood clot forming that can cause a stroke if you have a medical condition called atrial fibrillation (a type of irregular heartbeat).  What do you need to know about xarelto ? Take  your Xarelto ONCE DAILY at the same time every day with your evening meal. If you have difficulty swallowing the tablet whole, you may crush it and mix in applesauce just prior to taking your dose.  Take Xarelto exactly as prescribed by your doctor and DO NOT stop taking Xarelto without talking to the doctor who prescribed the medication.  Stopping without other stroke prevention medication to take the place of Xarelto may increase your risk of developing a clot that causes a stroke.  Refill your prescription before you run out.  After discharge, you should have regular check-up appointments with your healthcare provider that is prescribing your Xarelto.  In the future your dose may need to be changed if your kidney function or weight changes by a significant amount.  What do you do if you miss a dose? If you are taking Xarelto ONCE DAILY and you miss a dose, take it as soon as you remember on the same day then continue your regularly scheduled once daily regimen the next day. Do not take two doses of Xarelto at the same time or on the same day.   Important Safety Information A possible side effect of Xarelto is bleeding. You should call your healthcare provider right away if you experience any of the following: ? Bleeding from an injury or your nose that does not stop. ? Unusual colored urine (red or dark brown) or unusual colored stools (red or black). ? Unusual bruising for unknown reasons. ? A serious fall or if  you hit your head (even if there is no bleeding).  Some medicines may interact with Xarelto and might increase your risk of bleeding while on Xarelto. To help avoid this, consult your healthcare provider or pharmacist prior to using any new prescription or non-prescription medications, including herbals, vitamins, non-steroidal anti-inflammatory drugs (NSAIDs) and supplements.  This website has more information on Xarelto: [email or web address]

## 2016-10-31 NOTE — Anesthesia Preprocedure Evaluation (Addendum)
Anesthesia Evaluation  Patient identified by MRN, date of birth, ID band Patient awake    Reviewed: Allergy & Precautions, NPO status , Patient's Chart, lab work & pertinent test results  History of Anesthesia Complications (+) PONV and history of anesthetic complications  Airway Mallampati: II  TM Distance: >3 FB Neck ROM: Full    Dental  (+) Teeth Intact, Dental Advisory Given   Pulmonary neg pulmonary ROS,    breath sounds clear to auscultation       Cardiovascular hypertension, Pt. on medications + dysrhythmias Atrial Fibrillation  Rhythm:Regular     Neuro/Psych negative neurological ROS  negative psych ROS   GI/Hepatic negative GI ROS, Neg liver ROS,   Endo/Other  negative endocrine ROS  Renal/GU negative Renal ROS     Musculoskeletal   Abdominal   Peds  Hematology negative hematology ROS (+)   Anesthesia Other Findings   Reproductive/Obstetrics                           Anesthesia Physical Anesthesia Plan  ASA: II  Anesthesia Plan: MAC   Post-op Pain Management:    Induction: Intravenous  PONV Risk Score and Plan: 3 and Ondansetron and Treatment may vary due to age or medical condition  Airway Management Planned: Natural Airway, Nasal Cannula and Simple Face Mask  Additional Equipment: None  Intra-op Plan:   Post-operative Plan:   Informed Consent: I have reviewed the patients History and Physical, chart, labs and discussed the procedure including the risks, benefits and alternatives for the proposed anesthesia with the patient or authorized representative who has indicated his/her understanding and acceptance.   Dental advisory given  Plan Discussed with: CRNA, Anesthesiologist and Surgeon  Anesthesia Plan Comments:        Anesthesia Quick Evaluation

## 2016-10-31 NOTE — Care Management Note (Signed)
Case Management Note  Patient Details  Name: Kathryn Arnold MRN: [id number] Date of Birth: September 23, 1945  Subjective/Objective:    S/p afib ablation, will continue on xarelto which she was already taking.                  Action/Plan: NCM will follow for dc needs.  Expected Discharge Date:                  Expected Discharge Plan:     In-House Referral:     Discharge planning Services  CM Consult  Post Acute Care Choice:    Choice offered to:     DME Arranged:    DME Agency:     HH Arranged:    HH Agency:     Status of Service:  In process, will continue to follow  If discussed at Long Length of Stay Meetings, dates discussed:    Additional Comments:  Leone Havenaylor, Jetaun Colbath [place], RN 10/31/2016, 10:16 PM

## 2016-10-31 NOTE — Transfer of Care (Signed)
Immediate Anesthesia Transfer of Care Note  Patient: Kathryn Arnold  Procedure(s) Performed: Procedure(s): Atrial Fibrillation Ablation (N/A)  Patient Location: Cath Lab  Anesthesia Type:MAC  Level of Consciousness: awake and alert   Airway & Oxygen Therapy: Patient Spontanous Breathing and Patient connected to nasal cannula oxygen  Post-op Assessment: Report given to RN, Post -op Vital signs reviewed and stable and Patient moving all extremities  Post vital signs: Reviewed and stable  Last Vitals:  Vitals:   10/31/16 0541 10/31/16 1019  BP: (!) 173/86   Pulse: (!) 57   Temp: 36.6 C 36.5 C    Last Pain:  Vitals:   10/31/16 1019  TempSrc: Temporal         Complications: No apparent anesthesia complications

## 2016-10-31 NOTE — H&P (View-Only) (Signed)
PCP: Junie Spencer, FNP Primary Cardiologist: none  Kathryn Arnold is a 71 y.o. female who presents today for routine electrophysiology followup.  She did very well post afib ablation 09/2014.  Unfortunately, she required cardioversion 12/17 and again more recently in May of this year.   She has had ongoing intermittent episodes as well despite taking flecainide 50mg  BID.  She has fatigue and SOB during her afib.    Today, she denies symptoms of palpitations, chest pain, shortness of breath,  lower extremity edema, dizziness, presyncope, or syncope.  The patient is otherwise without complaint today.   Past Medical History:  Diagnosis Date  . Abnormal Pap smear   . Complication of anesthesia    alittle slow to wake up-stayed drowsy  . 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 HSV-2 (herpes simplex virus 2) infection   . Hypercholesteremia   . Hyperlipidemia with target LDL less than 100   . Osteoporosis, unspecified    Last DEXA 10/2011   . Paroxysmal atrial fibrillation (HCC)    s/p ablation Afib and flutter 11/03/14  . Vitamin D deficiency    Past Surgical History:  Procedure Laterality Date  . BREAST LUMPECTOMY    . DILATION AND CURETTAGE OF UTERUS    . ELECTROPHYSIOLOGIC STUDY N/A 11/03/2014   Procedure: Atrial Fibrillation Ablation;  Surgeon: Hillis Range, MD;  Location: [facility] INVASIVE CV LAB;  Service: Cardiovascular;  Laterality: N/A;  . LAPAROSCOPY ABDOMEN DIAGNOSTIC      ROS- all systems are reviewed and negatives except as per HPI above  Current Outpatient Prescriptions  Medication Sig Dispense Refill  . diltiazem (CARDIZEM LA) 120 MG 24 hr tablet Take 1 tablet (120 mg total) by mouth daily. 90 tablet 2  . diltiazem (CARDIZEM) 60 MG tablet Take 1/2 to 1 tablet by mouth every 4 hours AS NEEDED for heart rate >100 as long as blood pressure >100    . flecainide (TAMBOCOR) 50 MG tablet Take 1 tablet (50 mg total) by mouth 2 (two) times daily. 60 tablet 3  . rivaroxaban  (XARELTO) 20 MG TABS tablet Take 1 tablet (20 mg total) by mouth daily with supper. 90 tablet 1  . rosuvastatin (CRESTOR) 20 MG tablet Take 1 tablet by mouth  daily 90 tablet 1  . valACYclovir (VALTREX) 500 MG tablet Take 1 tablet by mouth  daily 90 tablet 2   No current facility-administered medications for this visit.     Physical Exam: Vitals:   10/02/16 0923  BP: 136/76  Pulse: (!) 58  SpO2: 98%  Weight: 143 lb (64.9 kg)  Height: 5\' 5"  (1.651 m)    GEN- The patient is well appearing, alert and oriented x 3 today.   Head- normocephalic, atraumatic Eyes-  Sclera clear, conjunctiva pink Ears- hearing intact Oropharynx- clear Lungs- Clear to ausculation bilaterally, normal work of breathing Heart- Regular rate and rhythm, no murmurs, rubs or gallops, PMI not laterally displaced GI- soft, NT, ND, + BS Extremities- no clubbing, cyanosis, or edema  EKG tracing ordered today is personally reviewed and shows sinus rhythm 58 bpm, PR 180 msce, incomplete RBBB  Assessment and Plan:  1. Persistent afib The patient has recurrent symptomatic persistent afib post ablation despite medical therapy with flecainide.] Therapeutic strategies for afib including medicine and repeat ablation were discussed in detail with the patient today. Risk, benefits, and alternatives to EP study and radiofrequency ablation for afib were also discussed in detail today. These risks include but are not  limited to stroke, bleeding, vascular damage, tamponade, perforation, damage to the esophagus, lungs, and other structures, pulmonary vein stenosis, worsening renal function, and death. The patient understands these risk and wishes to proceed.  We will therefore proceed with catheter ablation at the next available time.  Will plan TEE prior to ablation. chads2vasc score is 2.  Continue on xarelto Update echo to evaluate for structural changes with afib   Hillis RangeJames Jacayla Nordell MD, [facility] 10/02/2016 9:43 AM

## 2016-10-31 NOTE — Interval H&P Note (Signed)
History and Physical Interval Note:  10/31/2016 7:11 AM  Kathryn Arnold  has presented today for surgery, with the diagnosis of Afib  The various methods of treatment have been discussed with the patient and family. After consideration of risks, benefits and other options for treatment, the patient has consented to  Procedure(s): Atrial Fibrillation Ablation (N/A) as a surgical intervention .  The patient's history has been reviewed, patient examined, no change in status, stable for surgery.  I have reviewed the patient's chart and labs.  Questions were answered to the patient's satisfaction.     Hillis RangeJames Jordell Outten

## 2016-11-01 DIAGNOSIS — I480 Paroxysmal atrial fibrillation: Secondary | ICD-10-CM | POA: Diagnosis not present

## 2016-11-01 DIAGNOSIS — I481 Persistent atrial fibrillation: Secondary | ICD-10-CM | POA: Diagnosis not present

## 2016-11-01 MED ORDER — DIPHENHYDRAMINE HCL 25 MG PO CAPS
25.0000 mg | ORAL_CAPSULE | Freq: Four times a day (QID) | ORAL | Status: DC | PRN
  Administered 2016-11-01: 05:00:00 25 mg via ORAL
  Filled 2016-11-01: qty 1

## 2016-11-01 MED ORDER — OFF THE BEAT BOOK
Freq: Once | Status: AC
  Administered 2016-11-01: 08:00:00
  Filled 2016-11-01: qty 1

## 2016-11-01 MED ORDER — HYDROCORTISONE 1 % EX CREA: TOPICAL_CREAM | CUTANEOUS | 0 refills | Status: DC | PRN

## 2016-11-01 MED ORDER — PANTOPRAZOLE SODIUM 40 MG PO TBEC: 40.0000 mg | DELAYED_RELEASE_TABLET | Freq: Every day | ORAL | 0 refills | Status: DC

## 2016-11-01 MED ORDER — HYDROCORTISONE 1 % EX CREA
TOPICAL_CREAM | CUTANEOUS | Status: DC | PRN
  Administered 2016-11-01 (×2): via TOPICAL
  Filled 2016-11-01: qty 28

## 2016-11-01 NOTE — Discharge Summary (Signed)
ELECTROPHYSIOLOGY PROCEDURE DISCHARGE SUMMARY    Patient ID: [name],  MRN: [id number], DOB/AGE: [birth year] 69 y.o.  Admit date: 10/31/2016 Discharge date: 11/01/2016  Primary Care Physician: Junie Spencer, FNP  Primary Cardiologist.Electrophysiologist: Hillis Range, MD  Primary Discharge Diagnosis:  1. Persistent AFib     CHA2DS2Vasc is at least 2, on Xarelto  Secondary Discharge Diagnosis:  none  Procedures This Admission:  1.  Electrophysiology study and radiofrequency catheter ablation on 10/31/16 by Dr Hillis Range.  This study demonstrated:  CONCLUSIONS: 1. Sinus rhythm upon presentation.   2. Complete bidirectional CTI block demonstrated from a prior ablation procedure. 3. Ectopic atrial tachycardia ablated along the roof of the left atrium 4. The right superior and inferior pulmonary veins were quiescent from a prior ablation at baseline.  Return of electrical conduction along the carina of the left superior and inferior pulmonary veins.  These veins were reisolated today 5. additional left atrial ablation was performed with a standard box lesion created along the posterior wall of the left atrium 6. Multiple (at least 5) atypical left atrial flutter circuits too unstable for mapping and ablation today.  Mapping was also complicated by a paucity of electrograms within the left atrium 6. No early apparent complications.  Brief HPI: Kathryn Arnold is a 71 y.o. female with a history of persistent atrial fibrillation w/prior ablation.  She has failed medical therapy with flecainide. Risks, benefits, and alternatives to catheter ablation of atrial fibrillation were reviewed with the patient who wished to proceed.  The patient underwent TEE prior to the procedure which demonstrated normal LV function and no LAA thrombus.    Hospital Course:  The patient was admitted and underwent EPS/RFCA of atrial fibrillation with details as outlined above.  They were monitored on  telemetry overnight which demonstrated SB/SR 50's-60's.  Right groin (procedure site) is without complication on the day of discharge.  The patient was examined by Dr. Johney Frame and considered to be stable for discharge.  Wound care and restrictions were reviewed with the patient.  The patient will be seen back by Rudi Coco, NP in 4 weeks and Dr Johney Frame in 12 weeks for post ablation follow up. Will Rx 45days of PPI tx post procedure.    Physical Exam: Vitals:   10/31/16 2000 11/01/16 0110 11/01/16 0345 11/01/16 0817  BP: 133/74 111/67 133/78 125/72  Pulse: (!) 59 60 (!) 58 (!) 59  Resp: 16 (!) 22 18 16   Temp: 98 F (36.7 C) 98 F (36.7 C) 98 F (36.7 C) 98.3 F (36.8 C)  TempSrc: Oral Oral Oral Oral  SpO2: 98% 99% 95% 96%  Weight:   138 lb 10.7 oz (62.9 kg)   Height:        GEN- The patient is well appearing, alert and oriented x 3 today.   HEENT: normocephalic, atraumatic; sclera clear, conjunctiva pink; hearing intact; oropharynx clear; neck supple  Lungs- CTA b/l, normal work of breathing.  No wheezes, rales, rhonchi Heart- RRR, no murmurs, rubs or gallops  GI- soft, non-tender, non-distended Extremities- no clubbing, cyanosis, or edema; DP/PT/radial pulses 2+ bilaterally, right groin without hematoma/bruit MS- no significant deformity or atrophy Skin- warm and dry, no rash or lesion Psych- euthymic mood, full affect Neuro- strength and sensation are intact   Labs:   Lab Results  Component Value Date   WBC 4.1 10/23/2016   HGB 14.4 10/23/2016   HCT 41.0 10/23/2016   MCV 87 10/23/2016   PLT 242  10/23/2016   No results for input(s): NA, K, CL, CO2, BUN, CREATININE, CALCIUM, PROT, BILITOT, ALKPHOS, ALT, AST, GLUCOSE in the last 168 hours.  Invalid input(s): LABALBU   Discharge Medications:  Allergies as of 11/01/2016   No Known Allergies     Medication List    TAKE these medications   diltiazem 120 MG 24 hr tablet Commonly known as:  CARDIZEM LA Take 1 tablet  (120 mg total) by mouth daily.   diltiazem 60 MG tablet Commonly known as:  CARDIZEM Take 60 mg by mouth every 4 (four) hours as needed (for afib). Take 1/2 to 1 tablet by mouth every 4 hours AS NEEDED for heart rate >100 as long as blood pressure >100   flecainide 50 MG tablet Commonly known as:  TAMBOCOR Take 1 tablet (50 mg total) by mouth 2 (two) times daily.   hydrocortisone cream 1 % Apply topically as needed for itching. Skin irritation   pantoprazole 40 MG tablet Commonly known as:  PROTONIX Take 1 tablet (40 mg total) by mouth daily.   rivaroxaban 20 MG Tabs tablet Commonly known as:  XARELTO Take 1 tablet (20 mg total) by mouth daily with supper. What changed:  how much to take  how to take this  when to take this  additional instructions   rosuvastatin 20 MG tablet Commonly known as:  CRESTOR Take 1 tablet by mouth  daily What changed:  how much to take  how to take this  when to take this  additional instructions   valACYclovir 500 MG tablet Commonly known as:  VALTREX Take 1 tablet by mouth  daily What changed:  how much to take  how to take this  when to take this  additional instructions       Disposition:  Home Discharge Instructions    Diet - low sodium heart healthy    Complete by:  As directed    Increase activity slowly    Complete by:  As directed      Follow-up Information    [facility] Follow up on 11/29/2016.   Specialty:  Cardiology Why:  10:30AM Contact information: [address] [id number] mc [address] [place] [address] [phone]       Hillis Range, MD Follow up on 02/05/2017.   Specialty:  Cardiology Why:  9:30AM Contact information: [address] [place] [address] [phone]           Duration of Discharge Encounter: Greater than 30 minutes including physician time.  Signed, Francis Dowse, PA-c 11/01/2016 9:09 AM  I have seen,  examined the patient, and reviewed the above assessment and plan.  On exam, RRR.  Changes to above are made where necessary.    Co Sign: Hillis Range, MD 11/01/2016 4:33 PM

## 2016-11-01 NOTE — Care Management Note (Signed)
Case Management Note  Patient Details  Name: Alena BillsShirley L Laskin MRN: [id number] Date of Birth: August 06, 1945  Subjective/Objective:   From home with spouse, pta indep, S/p afib ablation, will continue on xarelto which she was already taking.   She is for dc today, no needs.            Action/Plan: Discharge today, no needs.  Expected Discharge Date:  11/01/16               Expected Discharge Plan:  Home/Self Care  In-House Referral:     Discharge planning Services  CM Consult  Post Acute Care Choice:    Choice offered to:     DME Arranged:    DME Agency:     HH Arranged:    HH Agency:     Status of Service:  Completed, signed off  If discussed at [employer] Length of Stay Meetings, dates discussed:    Additional Comments:  Leone Havenaylor, Odester Nilson Clinton, RN 11/01/2016, 9:22 AM

## 2016-11-03 NOTE — Anesthesia Postprocedure Evaluation (Signed)
Anesthesia Post Note  Patient: Kathryn Arnold  Procedure(s) Performed: Procedure(s) (LRB): Atrial Fibrillation Ablation (N/A)     Patient location during evaluation: Cath Lab Anesthesia Type: MAC Level of consciousness: awake and alert Pain management: pain level controlled Vital Signs Assessment: post-procedure vital signs reviewed and stable Respiratory status: spontaneous breathing, nonlabored ventilation, respiratory function stable and patient connected to nasal cannula oxygen Cardiovascular status: stable and blood pressure returned to baseline Anesthetic complications: no    Last Vitals:  Vitals:   11/01/16 0345 11/01/16 0817  BP: 133/78 125/72  Pulse: (!) 58 (!) 59  Resp: 18 16  Temp: 36.7 C 36.8 C    Last Pain:  Vitals:   11/01/16 0817  TempSrc: Oral  PainSc:                  [name]

## 2016-11-22 ENCOUNTER — Ambulatory Visit (INDEPENDENT_AMBULATORY_CARE_PROVIDER_SITE_OTHER): Payer: 59 | Admitting: Family

## 2016-11-22 ENCOUNTER — Encounter: Payer: Self-pay | Admitting: Family

## 2016-11-22 VITALS — BP 125/71 | HR 49 | Temp 97.2°F | Ht 65.0 in | Wt 138.2 lb

## 2016-11-22 DIAGNOSIS — I10 Essential (primary) hypertension: Secondary | ICD-10-CM | POA: Diagnosis not present

## 2016-11-22 DIAGNOSIS — E785 Hyperlipidemia, unspecified: Secondary | ICD-10-CM

## 2016-11-22 DIAGNOSIS — B009 Herpesviral infection, unspecified: Secondary | ICD-10-CM

## 2016-11-22 DIAGNOSIS — I480 Paroxysmal atrial fibrillation: Secondary | ICD-10-CM

## 2016-11-22 DIAGNOSIS — F411 Generalized anxiety disorder: Secondary | ICD-10-CM

## 2016-11-22 DIAGNOSIS — E559 Vitamin D deficiency, unspecified: Secondary | ICD-10-CM | POA: Diagnosis not present

## 2016-11-22 MED ORDER — ALPRAZOLAM 0.5 MG PO TABS: 0.5000 mg | ORAL_TABLET | Freq: Every day | ORAL | 3 refills | Status: DC

## 2016-11-22 MED ORDER — VALACYCLOVIR HCL 500 MG PO TABS: 500.0000 mg | ORAL_TABLET | Freq: Every day | ORAL | 1 refills | Status: DC

## 2016-11-22 MED ORDER — ROSUVASTATIN CALCIUM 20 MG PO TABS: 20.0000 mg | ORAL_TABLET | Freq: Every day | ORAL | 1 refills | Status: DC

## 2016-11-22 MED ORDER — RIVAROXABAN 20 MG PO TABS: 20.0000 mg | ORAL_TABLET | Freq: Every day | ORAL | 1 refills | Status: DC

## 2016-11-22 MED ORDER — DILTIAZEM HCL ER COATED BEADS 120 MG PO TB24: 120.0000 mg | ORAL_TABLET | Freq: Every day | ORAL | 2 refills | Status: DC

## 2016-11-22 NOTE — Patient Instructions (Signed)
Atrial Fibrillation Atrial fibrillation is a type of irregular or rapid heartbeat (arrhythmia). In atrial fibrillation, the heart quivers continuously in a chaotic pattern. This occurs when parts of the heart receive disorganized signals that make the heart unable to pump blood normally. This can increase the risk for stroke, heart failure, and other heart-related conditions. There are different types of atrial fibrillation, including:  Paroxysmal atrial fibrillation. This type starts suddenly, and it usually stops on its own shortly after it starts.  Persistent atrial fibrillation. This type often lasts longer than a week. It may stop on its own or with treatment.  Long-lasting persistent atrial fibrillation. This type lasts longer than 12 months.  Permanent atrial fibrillation. This type does not go away.  Talk with your health care provider to learn about the type of atrial fibrillation that you have. What are the causes? This condition is caused by some heart-related conditions or procedures, including:  A heart attack.  Coronary artery disease.  Heart failure.  Heart valve conditions.  High blood pressure.  Inflammation of the sac that surrounds the heart (pericarditis).  Heart surgery.  Certain heart rhythm disorders, such as Wolf-Parkinson-White syndrome.  Other causes include:  Pneumonia.  Obstructive sleep apnea.  Blockage of an artery in the lungs (pulmonary embolism, or PE).  Lung cancer.  Chronic lung disease.  Thyroid problems, especially if the thyroid is overactive (hyperthyroidism).  Caffeine.  Excessive alcohol use or illegal drug use.  Use of some medicines, including certain decongestants and diet pills.  Sometimes, the cause cannot be found. What increases the risk? This condition is more likely to develop in:  People who are older in age.  People who smoke.  People who have diabetes mellitus.  People who are overweight  (obese).  Athletes who exercise vigorously.  What are the signs or symptoms? Symptoms of this condition include:  A feeling that your heart is beating rapidly or irregularly.  A feeling of discomfort or pain in your chest.  Shortness of breath.  Sudden light-headedness or weakness.  Getting tired easily during exercise.  In some cases, there are no symptoms. How is this diagnosed? Your health care provider may be able to detect atrial fibrillation when taking your pulse. If detected, this condition may be diagnosed with:  An electrocardiogram (ECG).  A Holter monitor test that records your heartbeat patterns over a 24-hour period.  Transthoracic echocardiogram (TTE) to evaluate how blood flows through your heart.  Transesophageal echocardiogram (TEE) to view more detailed images of your heart.  A stress test.  Imaging tests, such as a CT scan or chest X-ray.  Blood tests.  How is this treated? The main goals of treatment are to prevent blood clots from forming and to keep your heart beating at a normal rate and rhythm. The type of treatment that you receive depends on many factors, such as your underlying medical conditions and how you feel when you are experiencing atrial fibrillation. This condition may be treated with:  Medicine to slow down the heart rate, bring the heart's rhythm back to normal, or prevent clots from forming.  Electrical cardioversion. This is a procedure that resets your heart's rhythm by delivering a controlled, low-energy shock to the heart through your skin.  Different types of ablation, such as catheter ablation, catheter ablation with pacemaker, or surgical ablation. These procedures destroy the heart tissues that send abnormal signals. When the pacemaker is used, it is placed under your skin to help your heart beat in   a regular rhythm.  Follow these instructions at home:  Take over-the counter and prescription medicines only as told by your  health care provider.  If your health care provider prescribed a blood-thinning medicine (anticoagulant), take it exactly as told. Taking too much blood-thinning medicine can cause bleeding. If you do not take enough blood-thinning medicine, you will not have the protection that you need against stroke and other problems.  Do not use tobacco products, including cigarettes, chewing tobacco, and e-cigarettes. If you need help quitting, ask your health care provider.  If you have obstructive sleep apnea, manage your condition as told by your health care provider.  Do not drink alcohol.  Do not drink beverages that contain caffeine, such as coffee, soda, and tea.  Maintain a healthy weight. Do not use diet pills unless your health care provider approves. Diet pills may make heart problems worse.  Follow diet instructions as told by your health care provider.  Exercise regularly as told by your health care provider.  Keep all follow-up visits as told by your health care provider. This is important. How is this prevented?  Avoid drinking beverages that contain caffeine or alcohol.  Avoid certain medicines, especially medicines that are used for breathing problems.  Avoid certain herbs and herbal medicines, such as those that contain ephedra or ginseng.  Do not use illegal drugs, such as cocaine and amphetamines.  Do not smoke.  Manage your high blood pressure. Contact a health care provider if:  You notice a change in the rate, rhythm, or strength of your heartbeat.  You are taking an anticoagulant and you notice increased bruising.  You tire more easily when you exercise or exert yourself. Get help right away if:  You have chest pain, abdominal pain, sweating, or weakness.  You feel nauseous.  You notice blood in your vomit, bowel movement, or urine.  You have shortness of breath.  You suddenly have swollen feet and ankles.  You feel dizzy.  You have sudden weakness or  numbness of the face, arm, or leg, especially on one side of the body.  You have trouble speaking, trouble understanding, or both (aphasia).  Your face or your eyelid droops on one side. These symptoms may represent a serious problem that is an emergency. Do not wait to see if the symptoms will go away. Get medical help right away. Call your local emergency services (911 in the [place] Do not drive yourself to the hospital. This information is not intended to replace advice given to you by your health care provider. Make sure you discuss any questions you have with your health care provider. Document Released: 04/10/2005 Document Revised: 08/18/2015 Document Reviewed: 08/05/2014 Elsevier Interactive Patient Education  2017 Elsevier Inc.  

## 2016-11-22 NOTE — Progress Notes (Signed)
Subjective:    Patient ID: Kathryn Arnold, female    DOB: 06-Feb-1946, 71 y.o.   MRN: [id number]  Pt presents to the office today for chronic follow up. Pt is followed by Cardiologists for A Fib and had an ablation on 10/31/16. Pt states she has had two slight episodes of A Fib since then, but has done ok.  Hypertension  This is a chronic problem. The current episode started more than 1 year ago. The problem has been resolved since onset. The problem is controlled. Associated symptoms include anxiety and malaise/fatigue. Pertinent negatives include no peripheral edema or shortness of breath. Risk factors for coronary artery disease include dyslipidemia, post-menopausal state, sedentary lifestyle and family history. The current treatment provides moderate improvement. There is no history of kidney disease, CAD/MI, CVA or heart failure.  Hyperlipidemia  This is a chronic problem. The current episode started more than 1 year ago. The problem is controlled. Recent lipid tests were reviewed and are normal. Pertinent negatives include no shortness of breath. Current antihyperlipidemic treatment includes statins. The current treatment provides moderate improvement of lipids.  Anxiety  Presents for follow-up visit. Symptoms include excessive worry, nervous/anxious behavior and panic. Patient reports no decreased concentration or shortness of breath. Symptoms occur occasionally (when in A fib her anxiety is worse). The severity of symptoms is moderate. The quality of sleep is good.    Gastroesophageal Reflux  She reports no belching, no coughing or no heartburn. This is a chronic problem. The current episode started more than 1 year ago. The problem occurs occasionally. The symptoms are aggravated by certain foods. She has tried a PPI for the symptoms. The treatment provided significant relief.  Herpes  Taking valtrex 500 mg daily. Doing well.    Review of Systems  Constitutional: Positive for  malaise/fatigue.  Respiratory: Negative for cough and shortness of breath.   Gastrointestinal: Negative for heartburn.  Psychiatric/Behavioral: Negative for decreased concentration. The patient is nervous/anxious.   All other systems reviewed and are negative.      Objective:   Physical Exam  Constitutional: She is oriented to person, place, and time. She appears well-developed and well-nourished. No distress.  HENT:  Head: Normocephalic and atraumatic.  Right Ear: External ear normal.  Left Ear: External ear normal.  Nose: Nose normal.  Mouth/Throat: Oropharynx is clear and moist.  Eyes: Pupils are equal, round, and reactive to light.  Neck: Normal range of motion. Neck supple. No thyromegaly present.  Cardiovascular: Normal heart sounds and intact distal pulses.  An irregular rhythm present. Bradycardia present.   No murmur heard. Pulmonary/Chest: Effort normal and breath sounds normal. No respiratory distress. She has no wheezes.  Abdominal: Soft. Bowel sounds are normal. She exhibits no distension. There is no tenderness.  Musculoskeletal: Normal range of motion. She exhibits no edema or tenderness.  Neurological: She is alert and oriented to person, place, and time.  Skin: Skin is warm and dry.  Psychiatric: She has a normal mood and affect. Her behavior is normal. Judgment and thought content normal.  Vitals reviewed.    BP 125/71   Pulse (!) 49   Temp (!) 97.2 F (36.2 C) (Oral)   Ht _0  (1.651 m)   Wt 138 lb 3.2 oz (62.7 kg)   BMI 23.00 kg/m      Assessment & Plan:  1. Essential hypertension, benign - CMP14+EGFR - diltiazem (CARDIZEM LA) 120 MG 24 hr tablet; Take 1 tablet (120 mg total) by mouth daily.  Dispense: 90 tablet; Refill: 2  2. Paroxysmal atrial fibrillation (HCC) - CMP14+EGFR - ALPRAZolam (XANAX) 0.5 MG tablet; Take 1 tablet (0.5 mg total) by mouth at bedtime.  Dispense: 30 tablet; Refill: 3 - diltiazem (CARDIZEM LA) 120 MG 24 hr tablet; Take 1  tablet (120 mg total) by mouth daily.  Dispense: 90 tablet; Refill: 2 - rivaroxaban (XARELTO) 20 MG TABS tablet; Take 1 tablet (20 mg total) by mouth at bedtime.  Dispense: 90 tablet; Refill: 1  3. Vitamin D deficiency - CMP14+EGFR - VITAMIN D 25 Hydroxy (Vit-D Deficiency, Fractures)  4. Hyperlipidemia with target LDL less than 100 - CMP14+EGFR - Lipid panel - rosuvastatin (CRESTOR) 20 MG tablet; Take 1 tablet (20 mg total) by mouth at bedtime. Take 1 tablet by mouth  daily  Dispense: 90 tablet; Refill: 1  5. GAD (generalized anxiety disorder) - CMP14+EGFR - ALPRAZolam (XANAX) 0.5 MG tablet; Take 1 tablet (0.5 mg total) by mouth at bedtime.  Dispense: 30 tablet; Refill: 3  6. HSV-2 (herpes simplex virus 2) infection - valACYclovir (VALTREX) 500 MG tablet; Take 1 tablet (500 mg total) by mouth daily with lunch.  Dispense: 90 tablet; Refill: 1   Continue all meds and keep all cardiologist follow up Labs pending Health Maintenance reviewed Diet and exercise encouraged RTO 6 months   Evelina Dun, FNP

## 2016-11-23 ENCOUNTER — Other Ambulatory Visit: Payer: Self-pay

## 2016-11-23 DIAGNOSIS — E875 Hyperkalemia: Secondary | ICD-10-CM

## 2016-11-23 LAB — LIPID PANEL
CHOLESTEROL TOTAL: 155 mg/dL (ref 100–199)
Chol/HDL Ratio: 2.9 ratio (ref 0.0–4.4)
HDL: 54 mg/dL (ref >39)
LDL Calculated: 80 mg/dL (ref 0–99)
Triglycerides: 104 mg/dL (ref 0–149)
VLDL CHOLESTEROL CAL: 21 mg/dL (ref 5–40)

## 2016-11-23 LAB — CMP14+EGFR
A/G RATIO: 1.7 (ref 1.2–2.2)
ALBUMIN: 4.4 g/dL (ref 3.6–4.8)
ALT: 13 IU/L (ref 0–32)
AST: 19 IU/L (ref 0–40)
Alkaline Phosphatase: 64 IU/L (ref 39–117)
BUN/Creatinine Ratio: 23 (ref 12–28)
BUN: 17 mg/dL (ref 8–27)
Bilirubin Total: 0.4 mg/dL (ref 0.0–1.2)
CALCIUM: 9.7 mg/dL (ref 8.7–10.3)
CO2: 26 mmol/L (ref 20–29)
CREATININE: 0.73 mg/dL (ref 0.57–1.00)
Chloride: 104 mmol/L (ref 96–106)
GFR, EST AFRICAN AMERICAN: 97 mL/min/{1.73_m2} (ref >59)
GFR, EST NON AFRICAN AMERICAN: 84 mL/min/{1.73_m2} (ref >59)
GLOBULIN, TOTAL: 2.6 g/dL (ref 1.5–4.5)
Glucose: 103 mg/dL — ABNORMAL HIGH (ref 65–99)
Potassium: 5.3 mmol/L — ABNORMAL HIGH (ref 3.5–5.2)
SODIUM: 146 mmol/L — AB (ref 134–144)
Total Protein: 7 g/dL (ref 6.0–8.5)

## 2016-11-23 LAB — VITAMIN D 25 HYDROXY (VIT D DEFICIENCY, FRACTURES): Vit D, 25-Hydroxy: 38.1 ng/mL (ref 30.0–100.0)

## 2016-11-24 ENCOUNTER — Other Ambulatory Visit: Payer: 59

## 2016-11-24 DIAGNOSIS — E875 Hyperkalemia: Secondary | ICD-10-CM

## 2016-11-25 LAB — BMP8+EGFR
BUN / CREAT RATIO: 21 (ref 12–28)
BUN: 18 mg/dL (ref 8–27)
CALCIUM: 9.5 mg/dL (ref 8.7–10.3)
CHLORIDE: 102 mmol/L (ref 96–106)
CO2: 24 mmol/L (ref 20–29)
Creatinine, Ser: 0.87 mg/dL (ref 0.57–1.00)
GFR calc non Af Amer: 68 mL/min/{1.73_m2} (ref >59)
GFR, EST AFRICAN AMERICAN: 79 mL/min/{1.73_m2} (ref >59)
GLUCOSE: 104 mg/dL — AB (ref 65–99)
POTASSIUM: 4.1 mmol/L (ref 3.5–5.2)
Sodium: 141 mmol/L (ref 134–144)

## 2016-11-25 LAB — MAGNESIUM: MAGNESIUM: 2 mg/dL (ref 1.6–2.3)

## 2016-11-26 ENCOUNTER — Encounter (HOSPITAL_COMMUNITY): Payer: Self-pay | Admitting: Emergency Medicine

## 2016-11-26 ENCOUNTER — Emergency Department (HOSPITAL_COMMUNITY): Payer: 59

## 2016-11-26 ENCOUNTER — Emergency Department (HOSPITAL_COMMUNITY)
Admission: EM | Admit: 2016-11-26 | Discharge: 2016-11-26 | Disposition: A | Discharge status: Home / Self Care | Payer: 59 | Attending: Emergency Medicine | Admitting: Emergency Medicine

## 2016-11-26 DIAGNOSIS — R002 Palpitations: Secondary | ICD-10-CM | POA: Diagnosis present

## 2016-11-26 DIAGNOSIS — I4891 Unspecified atrial fibrillation: Secondary | ICD-10-CM

## 2016-11-26 DIAGNOSIS — Z7901 Long term (current) use of anticoagulants: Secondary | ICD-10-CM | POA: Insufficient documentation

## 2016-11-26 DIAGNOSIS — I10 Essential (primary) hypertension: Secondary | ICD-10-CM | POA: Diagnosis not present

## 2016-11-26 DIAGNOSIS — R42 Dizziness and giddiness: Secondary | ICD-10-CM | POA: Diagnosis not present

## 2016-11-26 DIAGNOSIS — Z79899 Other long term (current) drug therapy: Secondary | ICD-10-CM | POA: Diagnosis not present

## 2016-11-26 LAB — CBC WITH DIFFERENTIAL/PLATELET
Basophils Absolute: 0 10*3/uL (ref 0.0–0.1)
Basophils Relative: 1 %
EOS ABS: 0.1 10*3/uL (ref 0.0–0.7)
EOS PCT: 2 %
HCT: 43.9 % (ref 36.0–46.0)
Hemoglobin: 15.1 g/dL — ABNORMAL HIGH (ref 12.0–15.0)
LYMPHS ABS: 1.6 10*3/uL (ref 0.7–4.0)
Lymphocytes Relative: 33 %
MCH: 30.1 pg (ref 26.0–34.0)
MCHC: 34.4 g/dL (ref 30.0–36.0)
MCV: 87.6 fL (ref 78.0–100.0)
MONOS PCT: 8 %
Monocytes Absolute: 0.4 10*3/uL (ref 0.1–1.0)
Neutro Abs: 2.7 10*3/uL (ref 1.7–7.7)
Neutrophils Relative %: 56 %
PLATELETS: 222 10*3/uL (ref 150–400)
RBC: 5.01 MIL/uL (ref 3.87–5.11)
RDW: 12.1 % (ref 11.5–15.5)
WBC: 4.7 10*3/uL (ref 4.0–10.5)

## 2016-11-26 LAB — MAGNESIUM: MAGNESIUM: 2.3 mg/dL (ref 1.7–2.4)

## 2016-11-26 LAB — BASIC METABOLIC PANEL
Anion gap: 9 (ref 5–15)
BUN: 15 mg/dL (ref 6–20)
CHLORIDE: 108 mmol/L (ref 101–111)
CO2: 22 mmol/L (ref 22–32)
CREATININE: 0.7 mg/dL (ref 0.44–1.00)
Calcium: 9.3 mg/dL (ref 8.9–10.3)
GFR calc Af Amer: >60 mL/min (ref >60)
Glucose, Bld: 108 mg/dL — ABNORMAL HIGH (ref 65–99)
Potassium: 5.2 mmol/L — ABNORMAL HIGH (ref 3.5–5.1)
SODIUM: 139 mmol/L (ref 135–145)

## 2016-11-26 IMAGING — CR DG CHEST 2V
2 series · 2 of 2 positions shown · non-contrast
Comparison: Chest radiograph December 28, 2013

CLINICAL DATA: Palpitations. Shortness of breath. History of
hypertension.

EXAM:
CHEST  2 VIEW

[chest pa]
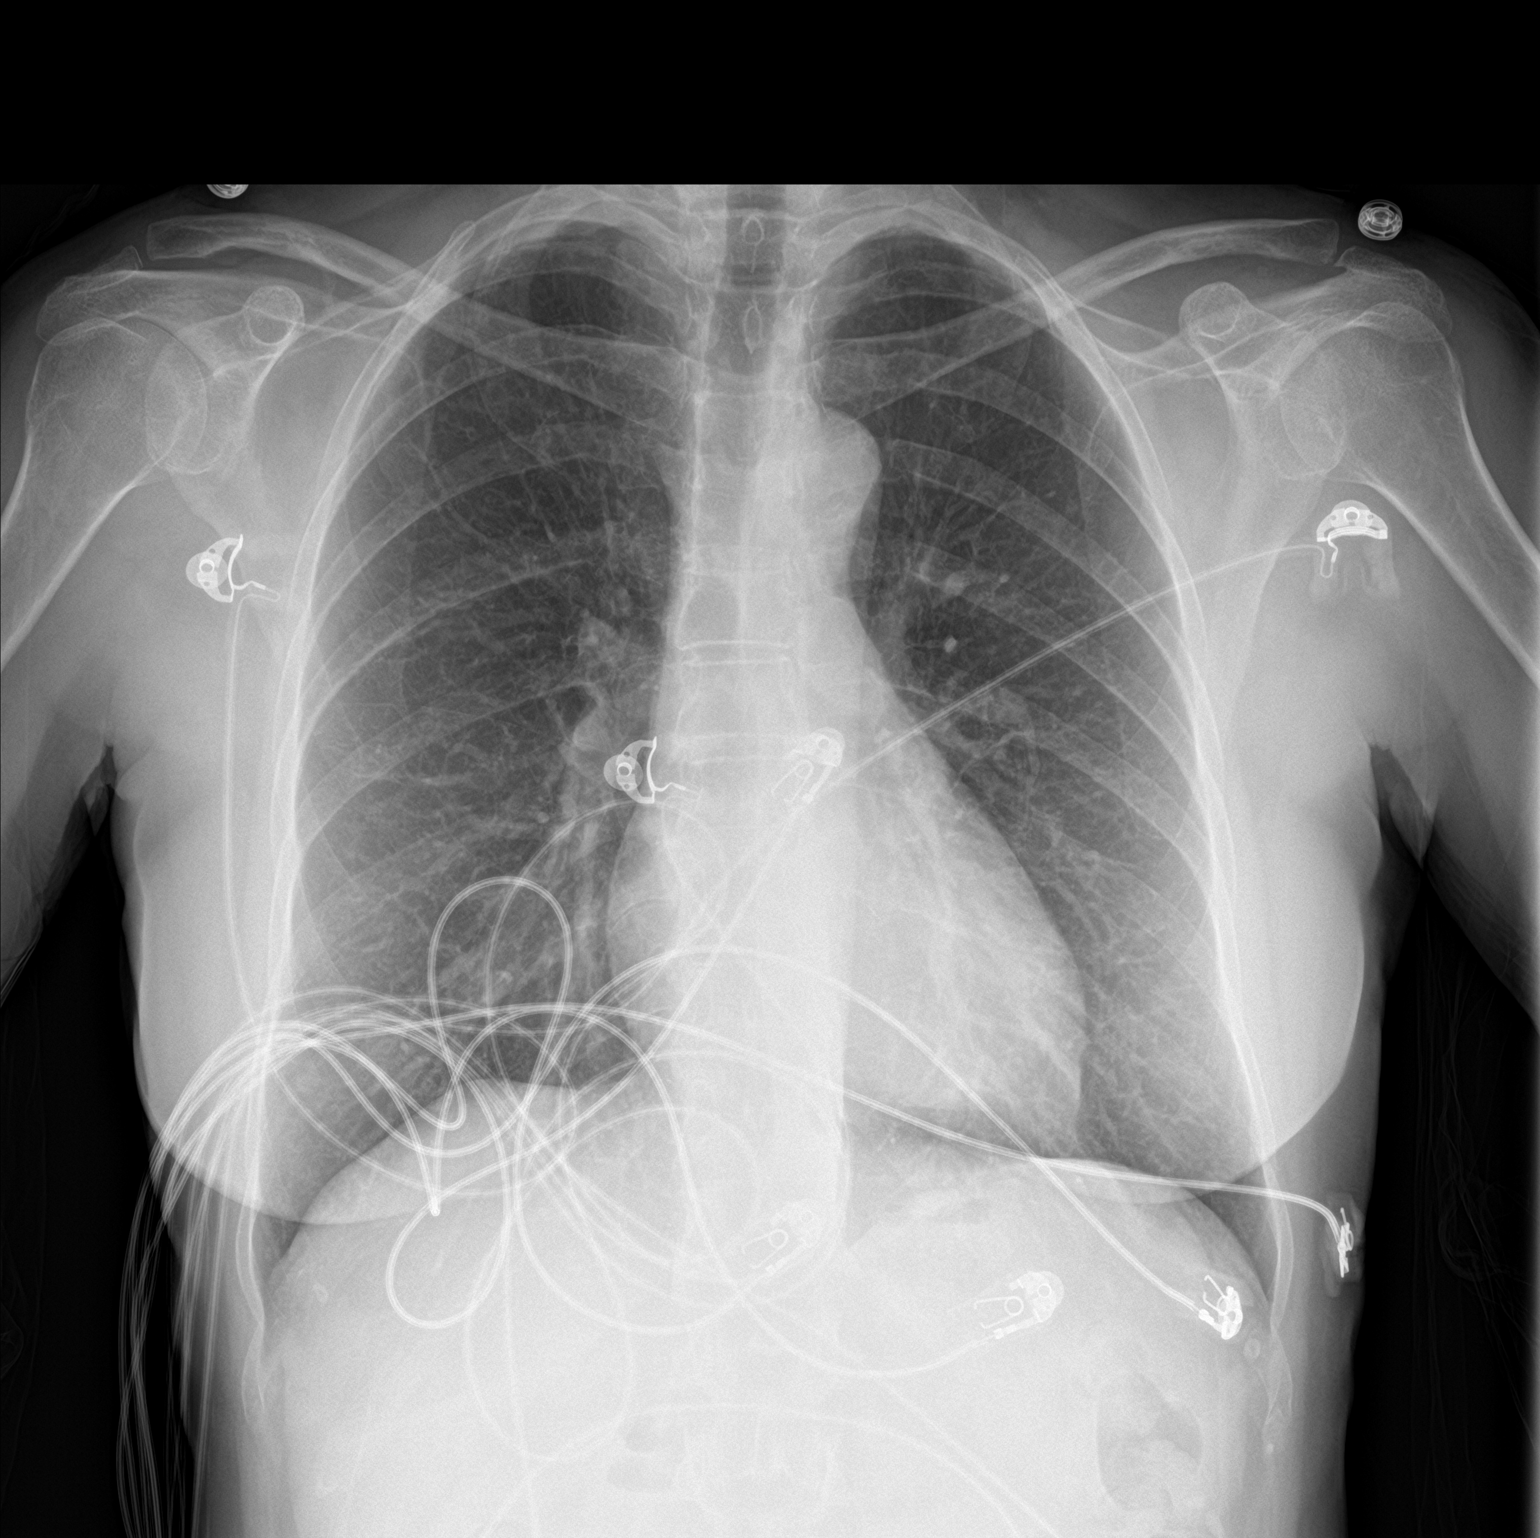

[chest lat]
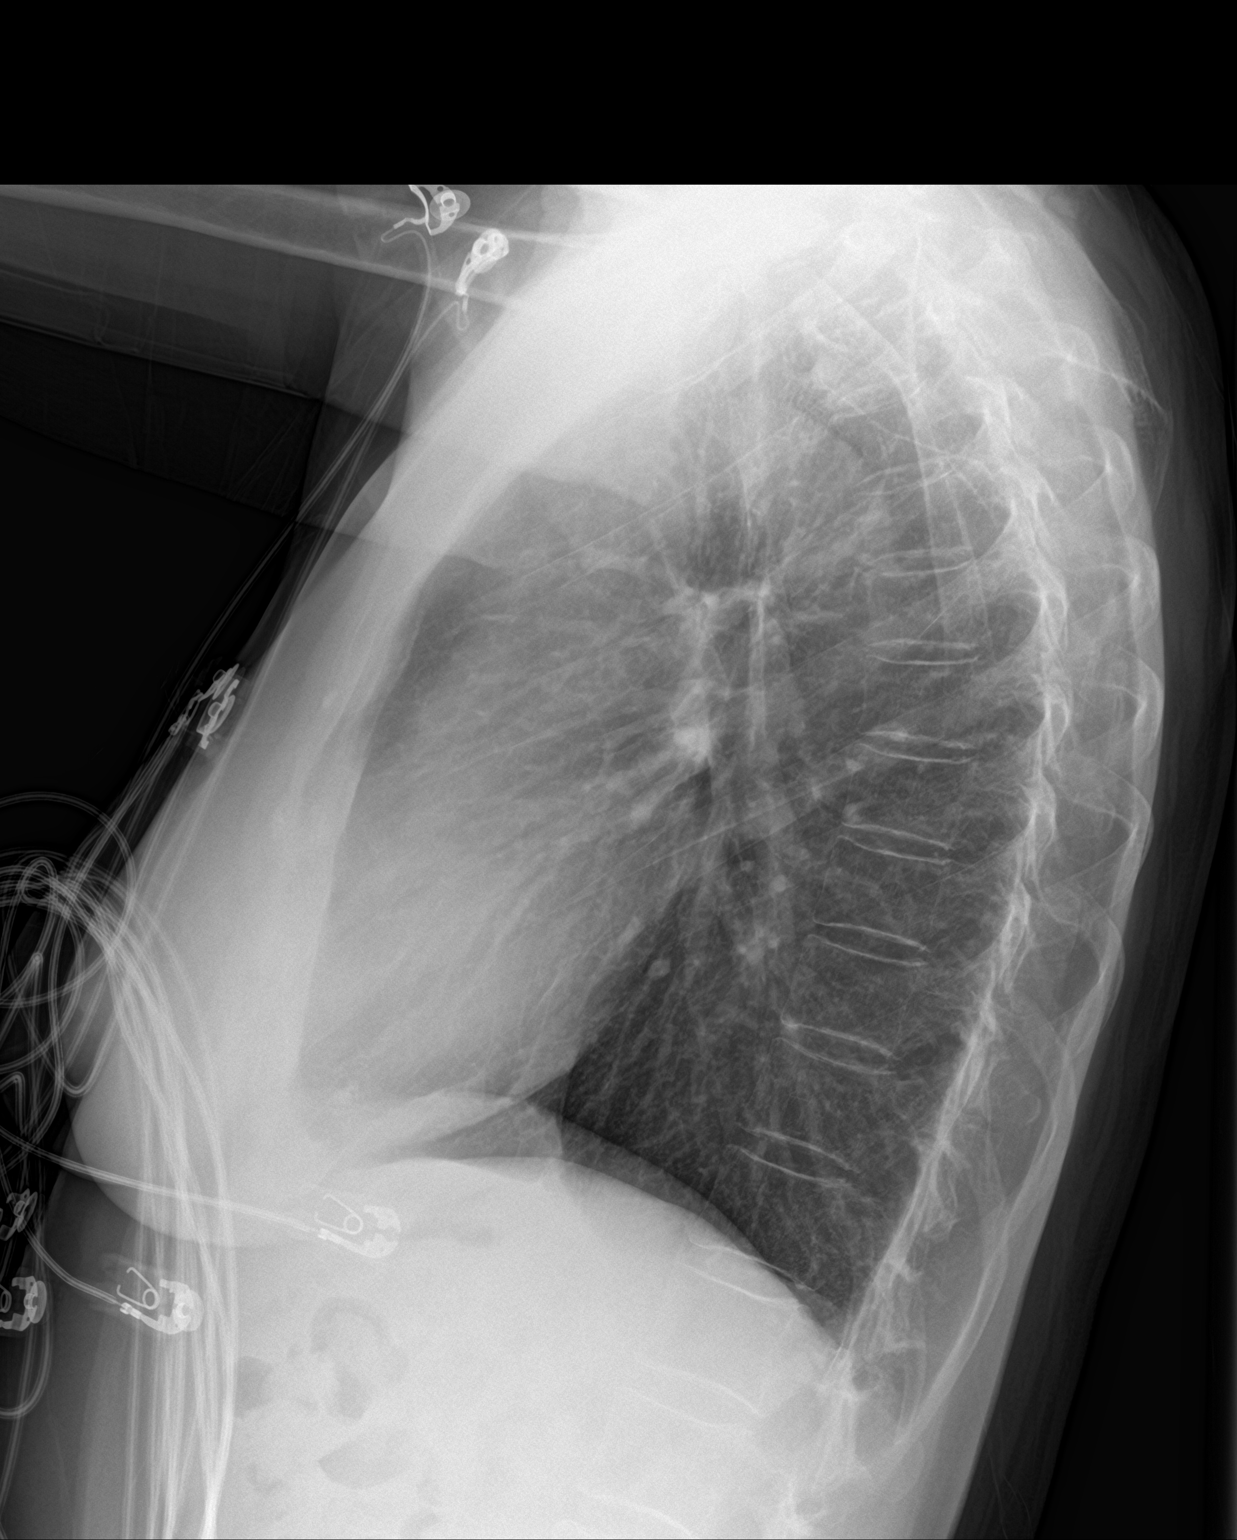

[2 of 2 positions shown; findings below may reference images not displayed]

FINDINGS: Cardiomediastinal silhouette is normal. No pleural effusions or
focal consolidations. Linear density RIGHT midlung zone. Trachea
projects midline and there is no pneumothorax. Soft tissue planes
and included osseous structures are non-suspicious. Calcification
LEFT neck is likely vascular.
IMPRESSION: RIGHT midlung zone atelectasis/ scarring.

## 2016-11-26 MED ORDER — METOPROLOL TARTRATE 5 MG/5ML IV SOLN
5.0000 mg | Freq: Once | INTRAVENOUS | Status: AC
  Administered 2016-11-26: 5 mg via INTRAVENOUS
  Filled 2016-11-26: qty 5

## 2016-11-26 NOTE — ED Provider Notes (Signed)
I saw and evaluated the patient, reviewed the resident's note and I agree with the findings and plan.  Pertinent History: has hx of afib, multiple ablations - most recently on 7/12 - she is on flecainaide and dilt as well as PRN dilt - she has had one episode of afib since ablation - she has had cardioversions in the past - she felt palpitations and light headed - she was pulse of 170's at home - dilt prehospital (15mg ) after she took 60 PO prior to EMS.  She is also on xarelto.  Pertinent Exam findings: mild tachycardia, no edema, lungs clear, belly soft and NT, no JVD  Labs pending, CXR K was slightly elevated early in the week per pt. Anticipate d/c if better controlled.  This patients CHA2DS2-VASc Score and unadjusted Ischemic Stroke Rate (% per year) is equal to 2.2 % stroke rate/year from a score of 2  CHA2DS2/VAS Stroke Risk Points      3 >= 2 Points: High Risk  1 - 1.99 Points: Medium Risk  0 Points: Low Risk    The previous score was 2 on 12/13/2015.:  Change:     Details    Note: External data might be a factor in metrics not marked with    Points Metrics   This score determines the patient's risk of having a stroke if the  patient has atrial fibrillation.       0 Has Congestive Heart Failure:  No   0 Has Vascular Disease:  No   1 Has Hypertension:  Yes   1 Age:  [age]   0 Has Diabetes:  No   0 Had Stroke:  No Had TIA:  No Had thromboembolism:  No   1 Female:  Yes     Pt converted back to baseline SB, states seh wants to go home - she is in agreement to return if sx worsen and to f/u with caridology   EKG Interpretation  Date/Time:  Sunday November 26 2016 19:30:05 EDT Ventricular Rate:  99 PR Interval:    QRS Duration: 115 QT Interval:  371 QTC Calculation: 477 R Axis:   -20 Text Interpretation:  Sinus rhythm Incomplete right bundle branch block Left axis deviation Left anterior fasicular block PR intermittently prolonged Since last tracing rate faster Confirmed by  Eber HongMiller, Jaire Pinkham ([id number]) on 11/26/2016 7:34:13 PM        Final diagnoses:  Atrial fibrillation with RVR (HCC)      Eber HongMiller, Meri Pelot, MD 11/27/16 1321

## 2016-11-26 NOTE — ED Triage Notes (Signed)
Pt BIB [facility] with c/o afib with RVR. Pt initially lightheaded/pale with a HR of 150-180, received 15mg  cardizem PTA, HR 96-102 upon arrival. Hx ablation x 3 weeks ago.

## 2016-11-26 NOTE — ED Notes (Signed)
Patient transported to X-ray 

## 2016-11-26 NOTE — Discharge Instructions (Signed)
Please follow up with Cardiologist as soon as possible for your recurrent atrial fibrillation. Take medications as prescribed. Return to ED with recurrence of symptoms not controlled with your as needed diltiazem.

## 2016-11-26 NOTE — ED Provider Notes (Signed)
[facility]-EM[facility] DEPT Provider Note   CSN: [id number] Arrival date & time: 11/26/16  1916     History   Chief Complaint Chief Complaint  Patient presents with  . Atrial Fibrillation     HPI Kathryn Arnold is a 71 y.o. Female with history atrial fibrillation s/p multiple ablations, most recent 10/31/2016 who presents with palpitations and lightheadedness. Patient noted onset of symptoms this evening, Home pulse monitor showed her HR in 170s. EMS gave 15mg  of diltiazem with improvement in HR to 100s.  Patient reports she has one other episode of Atrial fibrillation with RVR after her ablation surgery that resolved after her prn doses of diltiazem. Patient denies any chest pain, shortness of breath.  Past Medical History:  Diagnosis Date  . Abnormal Pap smear   . Arthritis    "fingers" (10/31/2016)  . Complication of anesthesia    a little slow to wake up-stayed drowsy  . 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 HSV-2 (herpes simplex virus 2) infection   . Hypercholesteremia   . Hyperlipidemia with target LDL less than 100   . Hypertension   . Osteoporosis, unspecified    Last DEXA 10/2011   . Paroxysmal atrial fibrillation (HCC)    s/p ablation Afib and flutter 11/03/14  . PONV (postoperative nausea and vomiting)   . Vitamin D deficiency     Patient Active Problem List   Diagnosis Date Noted  . Paroxysmal atrial fibrillation (HCC) 10/31/2016  . GAD (generalized anxiety disorder) 01/28/2014  . Essential hypertension, benign 01/20/2014  . Atrial fibrillation (HCC) 12/31/2013  . HSV-2 (herpes simplex virus 2) infection 12/18/2013  . Vitamin D deficiency 12/18/2013  . Hyperlipidemia with target LDL less than 100 08/28/2012  . Osteoporosis 08/28/2012    Past Surgical History:  Procedure Laterality Date  . ATRIAL FIBRILLATION ABLATION N/A 10/31/2016   Procedure: Atrial Fibrillation Ablation;  Surgeon: Hillis Range, MD;  Location: [facility] INVASIVE CV LAB;  Service:  Cardiovascular;  Laterality: N/A;  . BREAST LUMPECTOMY Left   . DILATION AND CURETTAGE OF UTERUS    . ELECTROPHYSIOLOGIC STUDY N/A 11/03/2014   Procedure: Atrial Fibrillation Ablation;  Surgeon: Hillis Range, MD;  Location: [facility] INVASIVE CV LAB;  Service: Cardiovascular;  Laterality: N/A;  . LAPAROSCOPY ABDOMEN DIAGNOSTIC  1990s  . TEE WITHOUT CARDIOVERSION N/A 10/30/2016   Procedure: TRANSESOPHAGEAL ECHOCARDIOGRAM (TEE);  Surgeon: Thurmon Fair, MD;  Location: [facility] ENDOSCOPY;  Service: Cardiovascular;  Laterality: N/A;  . TUBAL LIGATION  1978    OB History    Gravida Para Term Preterm AB Living   2 2 2  0 0 2   SAB TAB Ectopic Multiple Live Births   0 0 0 0         Home Medications    Prior to Admission medications   Medication Sig Start Date End Date Taking? Authorizing Provider  ALPRAZolam Prudy Feeler) 0.5 MG tablet Take 1 tablet (0.5 mg total) by mouth at bedtime. Patient taking differently: Take 0.5 mg by mouth daily as needed for anxiety or sleep.  11/22/16  Yes Hawks, Christy A, FNP  diltiazem (CARDIZEM LA) 120 MG 24 hr tablet Take 1 tablet (120 mg total) by mouth daily. 11/22/16  Yes Hawks, Christy A, FNP  diltiazem (CARDIZEM) 60 MG tablet Take 60 mg by mouth every 4 (four) hours as needed (for afib). Take 1/2 to 1 tablet by mouth every 4 hours AS NEEDED for heart rate >100 as long as blood pressure >100    Yes [provider]  flecainide (TAMBOCOR) 50 MG tablet Take 1 tablet (50 mg total) by mouth 2 (two) times daily. 09/15/16  Yes Newman Niparroll, Donna C, NP  pantoprazole (PROTONIX) 40 MG tablet Take 1 tablet (40 mg total) by mouth daily. 11/01/16 11/01/17 Yes Sheilah PigeonUrsuy, Renee Lynn, PA-C  rivaroxaban (XARELTO) 20 MG TABS tablet Take 1 tablet (20 mg total) by mouth at bedtime. 11/22/16  Yes Hawks, Christy A, FNP  rosuvastatin (CRESTOR) 20 MG tablet Take 1 tablet (20 mg total) by mouth at bedtime. Take 1 tablet by mouth  daily 11/22/16  Yes Hawks, Christy A, FNP  valACYclovir (VALTREX) 500 MG tablet  Take 1 tablet (500 mg total) by mouth daily with lunch. 11/22/16  Yes Hawks, Edilia Bohristy A, FNP    Family History Family History  Problem Relation Age of Onset  . Osteoporosis Mother   . Cancer Mother        breast  . Hypertension Mother   . Heart disease Father   . Other Neg Hx     Social History Social History  Substance Use Topics  . Smoking status: Never Smoker  . Smokeless tobacco: Never Used  . Alcohol use No     Allergies   Patient has no known allergies.   Review of Systems Review of Systems  Constitutional: Negative for chills and fever.  HENT: Negative for ear pain and sore throat.   Eyes: Negative for pain and visual disturbance.  Respiratory: Negative for cough and shortness of breath.   Cardiovascular: Positive for palpitations. Negative for chest pain.  Gastrointestinal: Negative for abdominal pain and vomiting.  Genitourinary: Negative for dysuria and hematuria.  Musculoskeletal: Negative for arthralgias and back pain.  Skin: Negative for color change and rash.  Neurological: Positive for light-headedness. Negative for seizures and syncope.  All other systems reviewed and are negative.    Physical Exam Updated Vital Signs BP 97/73   Pulse (!) 50   Temp 98.8 F (37.1 C) (Oral)   Resp 17   SpO2 97%   Physical Exam  Constitutional: She appears well-developed and well-nourished. No distress.  HENT:  Head: Normocephalic and atraumatic.  Eyes: Conjunctivae are normal.  Neck: Neck supple.  Cardiovascular: An irregular rhythm present. Tachycardia present.   No murmur heard. Pulmonary/Chest: Effort normal and breath sounds normal. No respiratory distress.  Abdominal: Soft. There is no tenderness.  Musculoskeletal: She exhibits no edema.  Neurological: She is alert.  Skin: Skin is warm and dry.  Psychiatric: She has a normal mood and affect.  Nursing note and vitals reviewed.    ED Treatments / Results  Labs (all labs ordered are listed, but only  abnormal results are displayed) Labs Reviewed  CBC WITH DIFFERENTIAL/PLATELET - Abnormal; Notable for the following:       Result Value   Hemoglobin 15.1 (*)    All other components within normal limits  BASIC METABOLIC PANEL - Abnormal; Notable for the following:    Potassium 5.2 (*)    Glucose, Bld 108 (*)    All other components within normal limits  MAGNESIUM    EKG  EKG Interpretation  Date/Time:  Sunday November 26 2016 19:30:05 EDT Ventricular Rate:  99 PR Interval:    QRS Duration: 115 QT Interval:  371 QTC Calculation: 477 R Axis:   -20 Text Interpretation:  Sinus rhythm Incomplete right bundle branch block Left axis deviation Left anterior fasicular block PR intermittently prolonged Since last tracing rate faster Confirmed by Eber HongMiller, Brian ([id number]) on 11/26/2016  7:34:13 PM       Radiology Dg Chest 2 View  Result Date: 11/26/2016 CLINICAL DATA:  Palpitations. Shortness of breath. History of hypertension. EXAM: CHEST  2 VIEW COMPARISON:  Chest radiograph December 28, 2013 FINDINGS: Cardiomediastinal silhouette is normal. No pleural effusions or focal consolidations. Linear density RIGHT midlung zone. Trachea projects midline and there is no pneumothorax. Soft tissue planes and included osseous structures are non-suspicious. Calcification LEFT neck is likely vascular. IMPRESSION: RIGHT midlung zone atelectasis/ scarring. Electronically Signed   By: Awilda Metroourtnay  Bloomer M.D.   On: 11/26/2016 20:20    Procedures Procedures (including critical care time)  Medications Ordered in ED Medications  metoprolol tartrate (LOPRESSOR) injection 5 mg (5 mg Intravenous Given 11/26/16 2019)     Initial Impression / Assessment and Plan / ED Course  I have reviewed the triage vital signs and the nursing notes.  Pertinent labs & imaging results that were available during my care of the patient were reviewed by me and considered in my medical decision making (see chart for details).      Nathanial RancherShirley Arnold is a 71 year old female with history of atrial fibrillation on Xarelto s/p multiple ablative surgeries and cardioversions, most recent ablation approximately 2 weeks ago who returns to the ED with A. fib with RVR. Improvement in tachycardia after EMS administered diltiazem 2 heart rate in the low 100s. According to patient she normally runs in the 50s to 60s.  Benign physical exam. Initial EKG sinus rhythm, however while at bedside patient's rhythm strip seemed to go back into afib intermittently. Given additional dose of metoprolol 5 mg IV, with conversion to sinus rhythm. Heart rate in 50s. Patient completely asymptomatic, and requesting to go home. She is stable for discharge at this time with close follow-up with her cardiologist and electrophysiologist. Return precautions discussed patient in agreement with plan at time of discharge.  Patient and plan of care discussed with Attending physician, Dr. Hyacinth MeekerMiller.    Final Clinical Impressions(s) / ED Diagnoses   Final diagnoses:  Atrial fibrillation with RVR [facility](HCC)    New Prescriptions Discharge Medication List as of 11/26/2016 10:32 PM       Wynelle ClevelandMizera, Malissia Rabbani, MD 11/27/16 0139    Eber HongMiller, Brian, MD 11/27/16 1321

## 2016-11-29 ENCOUNTER — Ambulatory Visit (HOSPITAL_COMMUNITY)
Admission: RE | Admit: 2016-11-29 | Discharge: 2016-11-29 | Disposition: A | Discharge status: Home / Self Care | Payer: 59 | Source: Ambulatory Visit | Attending: Nurse Practitioner | Admitting: Nurse Practitioner

## 2016-11-29 ENCOUNTER — Telehealth: Payer: Self-pay | Admitting: *Deleted

## 2016-11-29 ENCOUNTER — Encounter (HOSPITAL_COMMUNITY): Payer: Self-pay | Admitting: Nurse Practitioner

## 2016-11-29 VITALS — BP 142/82 | HR 68 | Ht 65.0 in | Wt 139.0 lb

## 2016-11-29 DIAGNOSIS — Z9889 Other specified postprocedural states: Secondary | ICD-10-CM | POA: Insufficient documentation

## 2016-11-29 DIAGNOSIS — Z7901 Long term (current) use of anticoagulants: Secondary | ICD-10-CM | POA: Diagnosis not present

## 2016-11-29 DIAGNOSIS — E559 Vitamin D deficiency, unspecified: Secondary | ICD-10-CM | POA: Diagnosis not present

## 2016-11-29 DIAGNOSIS — Z803 Family history of malignant neoplasm of breast: Secondary | ICD-10-CM | POA: Diagnosis not present

## 2016-11-29 DIAGNOSIS — I10 Essential (primary) hypertension: Secondary | ICD-10-CM | POA: Insufficient documentation

## 2016-11-29 DIAGNOSIS — Z79899 Other long term (current) drug therapy: Secondary | ICD-10-CM | POA: Insufficient documentation

## 2016-11-29 DIAGNOSIS — I480 Paroxysmal atrial fibrillation: Secondary | ICD-10-CM | POA: Insufficient documentation

## 2016-11-29 DIAGNOSIS — Z8249 Family history of ischemic heart disease and other diseases of the circulatory system: Secondary | ICD-10-CM | POA: Insufficient documentation

## 2016-11-29 DIAGNOSIS — E7800 Pure hypercholesterolemia, unspecified: Secondary | ICD-10-CM | POA: Diagnosis not present

## 2016-11-29 NOTE — Telephone Encounter (Signed)
Patient aware of lab results.

## 2016-11-29 NOTE — Progress Notes (Signed)
Patient ID: Kathryn Arnold, female   DOB: 13-May-1945, 71 y.o.   MRN: [id number]     Primary Care Physician: Junie Spencer, FNP Referring Physician: [facility] f/u   Kathryn Arnold is a 71 y.o. female with a h/o afib s/p PVI and aflutter abaltion 11/03/14 for f/u in the afib clinic. She had been doing well maintaining SR.  She was off flecainide, but does have 150 mg to use as needed for breakthrough afib.  She watches her weight carefully and is normal weight, no tobacco/alcohol/ caffeine use.  She is in the afib clinic today for f/u ER visit 5/24 for afib with RVR. Pt took 150 mg flecainide but with no response. EMS thought  It was because flecainide  had expired. EKG showed rapid afib. She was cardioverted successfully. Compliant with DOAC. She becomes very anxious with the breakthrough afib. She called the afib clinic after the ER visit and was placed back on daily flecainide at 50 mg bid. She had been having shorter spells of afib at home usually responding to Cardizem. She also sounds like she has spells of  palpitations  She says no further afib. She is pending an appointment with Dr. Johney Frame 6/11 to discuss redo ablation.  Pt had redo ablation 7/10. She did well but ended up going to the ER 8/5 for afib with RVR on 8/5. She did settle down in SR after   IV drugs. It sounded as though she was having runs of afib mixed with SR on the monitor in the ER.. SInce then, she noted one very short session Sunday. She is depressed in the clinic today, does not understand why this afib won't go away after 2 ablations and AAD's. Denies any indigestion or groin issues.  Today, she denies symptoms of   chest pain, shortness of breath, orthopnea, PND, lower extremity edema, dizziness, presyncope, syncope, or neurologic sequela. The patient is tolerating medications without difficulties and is otherwise without complaint today.   Past Medical History:  Diagnosis Date  . Abnormal Pap smear   . Arthritis    "fingers" (10/31/2016)  . Complication of anesthesia    a little slow to wake up-stayed drowsy  . 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 HSV-2 (herpes simplex virus 2) infection   . Hypercholesteremia   . Hyperlipidemia with target LDL less than 100   . Hypertension   . Osteoporosis, unspecified    Last DEXA 10/2011   . Paroxysmal atrial fibrillation (HCC)    s/p ablation Afib and flutter 11/03/14  . PONV (postoperative nausea and vomiting)   . Vitamin D deficiency    Past Surgical History:  Procedure Laterality Date  . ATRIAL FIBRILLATION ABLATION N/A 10/31/2016   Procedure: Atrial Fibrillation Ablation;  Surgeon: Hillis Range, MD;  Location: [facility] INVASIVE CV LAB;  Service: Cardiovascular;  Laterality: N/A;  . BREAST LUMPECTOMY Left   . DILATION AND CURETTAGE OF UTERUS    . ELECTROPHYSIOLOGIC STUDY N/A 11/03/2014   Procedure: Atrial Fibrillation Ablation;  Surgeon: Hillis Range, MD;  Location: [facility] INVASIVE CV LAB;  Service: Cardiovascular;  Laterality: N/A;  . LAPAROSCOPY ABDOMEN DIAGNOSTIC  1990s  . TEE WITHOUT CARDIOVERSION N/A 10/30/2016   Procedure: TRANSESOPHAGEAL ECHOCARDIOGRAM (TEE);  Surgeon: Thurmon Fair, MD;  Location: [facility] ENDOSCOPY;  Service: Cardiovascular;  Laterality: N/A;  . TUBAL LIGATION  1978    Current Outpatient Prescriptions  Medication Sig Dispense Refill  . ALPRAZolam (XANAX) 0.5 MG tablet Take 1 tablet (0.5 mg total) by  mouth at bedtime. (Patient taking differently: Take 0.5 mg by mouth daily as needed for anxiety or sleep. ) 30 tablet 3  . diltiazem (CARDIZEM LA) 120 MG 24 hr tablet Take 1 tablet (120 mg total) by mouth daily. 90 tablet 2  . diltiazem (CARDIZEM) 60 MG tablet Take 60 mg by mouth every 4 (four) hours as needed (for afib). Take 1/2 to 1 tablet by mouth every 4 hours AS NEEDED for heart rate >100 as long as blood pressure >100     . flecainide (TAMBOCOR) 50 MG tablet Take 1 tablet (50 mg total) by mouth 2 (two) times daily. 60 tablet 3  .  pantoprazole (PROTONIX) 40 MG tablet Take 1 tablet (40 mg total) by mouth daily. 45 tablet 0  . rivaroxaban (XARELTO) 20 MG TABS tablet Take 1 tablet (20 mg total) by mouth at bedtime. 90 tablet 1  . rosuvastatin (CRESTOR) 20 MG tablet Take 1 tablet (20 mg total) by mouth at bedtime. Take 1 tablet by mouth  daily 90 tablet 1  . valACYclovir (VALTREX) 500 MG tablet Take 1 tablet (500 mg total) by mouth daily with lunch. 90 tablet 1   No current facility-administered medications for this encounter.     No Known Allergies  Social History   Social History  . Marital status: Married    Spouse name: N/A  . Number of children: N/A  . Years of education: N/A   Occupational History  . Not on file.   Social History Main Topics  . Smoking status: Never Smoker  . Smokeless tobacco: Never Used  . Alcohol use No  . Drug use: No  . Sexual activity: Not Currently    Birth control/ protection: None   Other Topics Concern  . Not on file   Social History Narrative   Lives in [place] with spouse.    Family History  Problem Relation Age of Onset  . Osteoporosis Mother   . Cancer Mother        breast  . Hypertension Mother   . Heart disease Father   . Other Neg Hx     ROS- All systems are reviewed and negative except as per the HPI above  Physical Exam: Vitals:   11/29/16 1042  BP: (!) 142/82  Pulse: 68  Weight: 139 lb (63 kg)  Height: 5\' 5"  (1.651 m)    GEN- The patient is well appearing, alert and oriented x 3 today.   Head- normocephalic, atraumatic Eyes-  Sclera clear, conjunctiva pink Ears- hearing intact Oropharynx- clear Neck- supple, no JVP Lymph- no cervical lymphadenopathy Lungs- Clear to ausculation bilaterally, normal work of breathing Heart- Regular rate and rhythm, no murmurs, rubs or gallops, PMI not laterally displaced GI- soft, NT, ND, + BS Extremities- no clubbing, cyanosis, or edema MS- no significant deformity or atrophy Skin- no rash or  lesion Psych- euthymic mood, full affect Neuro- strength and sensation are intact  EKG-SR with IRBBB, Pr int 135 ms, qrs int 102 ms, qtc 431 ms ( unchanged from baseline) Epic records reviewed  Assessment and Plan: 1. Paroxysmal afib 1 month s/p ablation Depressed because she had a ER visit for afib after ablation Thought second ablation would get rid of afib Reassured, still in blanking period Continue flecainide/diltiazem Continue xarelto without missed doses  F/u with [name] 10/15 afib clinic as needed   Lupita LeashDonna C. Matthew Folksarroll, ANP-C Afib Clinic [facility] [address] [place], [place] [address] [phone]

## 2017-01-02 ENCOUNTER — Other Ambulatory Visit (HOSPITAL_COMMUNITY): Payer: Self-pay | Admitting: *Deleted

## 2017-01-02 MED ORDER — FLECAINIDE ACETATE 50 MG PO TABS: 50.0000 mg | ORAL_TABLET | Freq: Two times a day (BID) | ORAL | 3 refills | Status: DC

## 2017-01-23 ENCOUNTER — Ambulatory Visit (INDEPENDENT_AMBULATORY_CARE_PROVIDER_SITE_OTHER): Payer: 59

## 2017-01-23 DIAGNOSIS — Z23 Encounter for immunization: Secondary | ICD-10-CM

## 2017-01-26 ENCOUNTER — Telehealth: Payer: Self-pay | Admitting: Family

## 2017-01-26 DIAGNOSIS — H9319 Tinnitus, unspecified ear: Secondary | ICD-10-CM

## 2017-01-26 NOTE — Telephone Encounter (Signed)
Pt notified of referral  

## 2017-01-26 NOTE — Telephone Encounter (Signed)
Referral to ENT ordered. If she does not hear from them in one week call the office.

## 2017-02-05 ENCOUNTER — Encounter (INDEPENDENT_AMBULATORY_CARE_PROVIDER_SITE_OTHER): Payer: Self-pay

## 2017-02-05 ENCOUNTER — Encounter: Payer: Self-pay | Admitting: Internal Medicine

## 2017-02-05 ENCOUNTER — Ambulatory Visit (INDEPENDENT_AMBULATORY_CARE_PROVIDER_SITE_OTHER): Payer: 59 | Admitting: Internal Medicine

## 2017-02-05 VITALS — BP 130/70 | HR 52 | Ht 65.0 in | Wt 140.6 lb

## 2017-02-05 DIAGNOSIS — I480 Paroxysmal atrial fibrillation: Secondary | ICD-10-CM

## 2017-02-05 DIAGNOSIS — I481 Persistent atrial fibrillation: Secondary | ICD-10-CM

## 2017-02-05 DIAGNOSIS — I4819 Other persistent atrial fibrillation: Secondary | ICD-10-CM

## 2017-02-05 NOTE — Patient Instructions (Signed)

## 2017-02-05 NOTE — Progress Notes (Signed)
PCP: Junie Spencer, FNP    Kathryn Arnold is a 71 y.o. female who presents today for routine electrophysiology followup.  Since his recent afib ablation, the patient reports doing very well.  she denies procedure related complications and is pleased with the results of the procedure.  She has had some ERAF early but found that this has improved greatly over the past 1-2 months.  She has found significant improvement since stopping eating fast food which she thinks is a trigger for her. Today, she denies symptoms of palpitations, chest pain, shortness of breath,  lower extremity edema, dizziness, presyncope, or syncope.  The patient is otherwise without complaint today.   Past Medical History:  Diagnosis Date  . Abnormal Pap smear   . Arthritis    "fingers" (10/31/2016)  . Complication of anesthesia    a little slow to wake up-stayed drowsy  . 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 HSV-2 (herpes simplex virus 2) infection   . Hypercholesteremia   . Hyperlipidemia with target LDL less than 100   . Hypertension   . Osteoporosis, unspecified    Last DEXA 10/2011   . Paroxysmal atrial fibrillation (HCC)    s/p ablation Afib and flutter 11/03/14  . PONV (postoperative nausea and vomiting)   . Vitamin D deficiency    Past Surgical History:  Procedure Laterality Date  . ATRIAL FIBRILLATION ABLATION N/A 10/31/2016   Procedure: Atrial Fibrillation Ablation;  Surgeon: Hillis Range, MD;  Location: [facility] INVASIVE CV LAB;  Service: Cardiovascular;  Laterality: N/A;  . BREAST LUMPECTOMY Left   . DILATION AND CURETTAGE OF UTERUS    . ELECTROPHYSIOLOGIC STUDY N/A 11/03/2014   Procedure: Atrial Fibrillation Ablation;  Surgeon: Hillis Range, MD;  Location: [facility] INVASIVE CV LAB;  Service: Cardiovascular;  Laterality: N/A;  . LAPAROSCOPY ABDOMEN DIAGNOSTIC  1990s  . TEE WITHOUT CARDIOVERSION N/A 10/30/2016   Procedure: TRANSESOPHAGEAL ECHOCARDIOGRAM (TEE);  Surgeon: Thurmon Fair, MD;  Location: [facility]  ENDOSCOPY;  Service: Cardiovascular;  Laterality: N/A;  . TUBAL LIGATION  1978    ROS- all systems are personally reviewed and negatives except as per HPI above  Current Outpatient Prescriptions  Medication Sig Dispense Refill  . ALPRAZolam (XANAX) 0.5 MG tablet Take 1 tablet (0.5 mg total) by mouth at bedtime. (Patient taking differently: Take 0.5 mg by mouth daily as needed for anxiety or sleep. ) 30 tablet 3  . diltiazem (CARDIZEM LA) 120 MG 24 hr tablet Take 1 tablet (120 mg total) by mouth daily. 90 tablet 2  . diltiazem (CARDIZEM) 60 MG tablet Take 60 mg by mouth every 4 (four) hours as needed (for afib). Take 1/2 to 1 tablet by mouth every 4 hours AS NEEDED for heart rate >100 as long as blood pressure >100     . flecainide (TAMBOCOR) 50 MG tablet Take 1 tablet (50 mg total) by mouth 2 (two) times daily. 60 tablet 3  . pantoprazole (PROTONIX) 40 MG tablet Take 1 tablet (40 mg total) by mouth daily. 45 tablet 0  . rivaroxaban (XARELTO) 20 MG TABS tablet Take 1 tablet (20 mg total) by mouth at bedtime. 90 tablet 1  . rosuvastatin (CRESTOR) 20 MG tablet Take 1 tablet (20 mg total) by mouth at bedtime. Take 1 tablet by mouth  daily 90 tablet 1  . valACYclovir (VALTREX) 500 MG tablet Take 1 tablet (500 mg total) by mouth daily with lunch. 90 tablet 1   No current facility-administered medications for this visit.  Physical Exam: Vitals:   02/05/17 0937  BP: 130/70  Pulse: (!) 52  Weight: 140 lb 9.6 oz (63.8 kg)  Height:  (1.651 m)    GEN- The patient is well appearing, alert and oriented x 3 today.   Head- normocephalic, atraumatic Eyes-  Sclera clear, conjunctiva pink Ears- hearing intact Oropharynx- clear Lungs- Clear to ausculation bilaterally, normal work of breathing Heart- Regular rate and rhythm, no murmurs, rubs or gallops, PMI not laterally displaced GI- soft, NT, ND, + BS Extremities- no clubbing, cyanosis, or edema  EKG tracing ordered today is personally  reviewed and shows sinus rhythm 52 bpm, PR 174 msec, QRS 112 msec, QTc , incomplete RBBB  Assessment and Plan:  1. Persistent atrial fibrillation Doing well s/p ablation chads2vasc score is 2.  Continue on xarelto She has multiple atypical flutter circuits which were not amenable to ablation.  I have informed her that she will likely require AAD therapy long term and will likely have occasional arrhythmias, though I am optimistic that we can keep her afib burden overall controlled.   No changes today  Return to see me in 3 months  Hillis Range MD, [facility] 02/05/2017 10:15 AM

## 2017-04-18 DIAGNOSIS — M1990 Unspecified osteoarthritis, unspecified site: Secondary | ICD-10-CM | POA: Insufficient documentation

## 2017-04-18 DIAGNOSIS — E7800 Pure hypercholesterolemia, unspecified: Secondary | ICD-10-CM | POA: Insufficient documentation

## 2017-04-18 DIAGNOSIS — T8859XA Other complications of anesthesia, initial encounter: Secondary | ICD-10-CM | POA: Insufficient documentation

## 2017-04-18 DIAGNOSIS — R112 Nausea with vomiting, unspecified: Secondary | ICD-10-CM | POA: Insufficient documentation

## 2017-04-18 DIAGNOSIS — Z9889 Other specified postprocedural states: Secondary | ICD-10-CM | POA: Insufficient documentation

## 2017-04-18 DIAGNOSIS — I10 Essential (primary) hypertension: Secondary | ICD-10-CM | POA: Insufficient documentation

## 2017-04-18 DIAGNOSIS — T4145XA Adverse effect of unspecified anesthetic, initial encounter: Secondary | ICD-10-CM | POA: Insufficient documentation

## 2017-04-30 ENCOUNTER — Ambulatory Visit: Payer: 59 | Admitting: Family

## 2017-05-02 ENCOUNTER — Encounter: Payer: Self-pay | Admitting: Family

## 2017-05-08 ENCOUNTER — Other Ambulatory Visit (HOSPITAL_COMMUNITY): Payer: Self-pay | Admitting: Nurse Practitioner

## 2017-05-09 ENCOUNTER — Encounter: Payer: Self-pay | Admitting: Internal Medicine

## 2017-05-09 ENCOUNTER — Ambulatory Visit: Payer: 59 | Admitting: Internal Medicine

## 2017-05-09 VITALS — BP 136/64 | HR 56 | Ht 65.0 in | Wt 136.0 lb

## 2017-05-09 DIAGNOSIS — I4819 Other persistent atrial fibrillation: Secondary | ICD-10-CM

## 2017-05-09 DIAGNOSIS — I480 Paroxysmal atrial fibrillation: Secondary | ICD-10-CM

## 2017-05-09 DIAGNOSIS — I481 Persistent atrial fibrillation: Secondary | ICD-10-CM | POA: Diagnosis not present

## 2017-05-09 NOTE — Progress Notes (Signed)
PCP: Junie Spencer, FNP  Primary EP: Dr Johney Frame  Kathryn Arnold is a 72 y.o. female who presents today for routine electrophysiology followup.  Since last being seen in our clinic, the patient reports doing very well.  Today, she denies symptoms of palpitations, chest pain, shortness of breath,  lower extremity edema, dizziness, presyncope, or syncope.  The patient is otherwise without complaint today.   Past Medical History:  Diagnosis Date  . Abnormal Pap smear   . Arthritis    "fingers" (10/31/2016)  . Complication of anesthesia    a little slow to wake up-stayed drowsy  . 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 HSV-2 (herpes simplex virus 2) infection   . Hypercholesteremia   . Hyperlipidemia with target LDL less than 100   . Hypertension   . Osteoporosis, unspecified    Last DEXA 10/2011   . Paroxysmal atrial fibrillation (HCC)    s/p ablation Afib and flutter 11/03/14  . PONV (postoperative nausea and vomiting)   . Vitamin D deficiency    Past Surgical History:  Procedure Laterality Date  . ATRIAL FIBRILLATION ABLATION N/A 10/31/2016   Procedure: Atrial Fibrillation Ablation;  Surgeon: Hillis Range, MD;  Location: [facility] INVASIVE CV LAB;  Service: Cardiovascular;  Laterality: N/A;  . BREAST LUMPECTOMY Left   . DILATION AND CURETTAGE OF UTERUS    . ELECTROPHYSIOLOGIC STUDY N/A 11/03/2014   Procedure: Atrial Fibrillation Ablation;  Surgeon: Hillis Range, MD;  Location: [facility] INVASIVE CV LAB;  Service: Cardiovascular;  Laterality: N/A;  . LAPAROSCOPY ABDOMEN DIAGNOSTIC  1990s  . TEE WITHOUT CARDIOVERSION N/A 10/30/2016   Procedure: TRANSESOPHAGEAL ECHOCARDIOGRAM (TEE);  Surgeon: Thurmon Fair, MD;  Location: [facility] ENDOSCOPY;  Service: Cardiovascular;  Laterality: N/A;  . TUBAL LIGATION  1978    ROS- all systems are reviewed and negatives except as per HPI above  Current Outpatient Medications  Medication Sig Dispense Refill  . ALPRAZolam (XANAX) 0.5 MG tablet Take 1 tablet  (0.5 mg total) by mouth at bedtime. (Patient taking differently: Take 0.5 mg by mouth daily as needed for anxiety or sleep. ) 30 tablet 3  . diltiazem (CARDIZEM LA) 120 MG 24 hr tablet Take 1 tablet (120 mg total) by mouth daily. 90 tablet 2  . diltiazem (CARDIZEM) 60 MG tablet Take 60 mg by mouth every 4 (four) hours as needed (for afib). Take 1/2 to 1 tablet by mouth every 4 hours AS NEEDED for heart rate >100 as long as blood pressure >100     . flecainide (TAMBOCOR) 50 MG tablet TAKE 1 TABLET BY MOUTH TWICE DAILY 60 tablet 3  . pantoprazole (PROTONIX) 40 MG tablet Take 1 tablet (40 mg total) by mouth daily. 45 tablet 0  . rivaroxaban (XARELTO) 20 MG TABS tablet Take 1 tablet (20 mg total) by mouth at bedtime. 90 tablet 1  . rosuvastatin (CRESTOR) 20 MG tablet Take 1 tablet (20 mg total) by mouth at bedtime. Take 1 tablet by mouth  daily 90 tablet 1  . valACYclovir (VALTREX) 500 MG tablet Take 1 tablet (500 mg total) by mouth daily with lunch. 90 tablet 1   No current facility-administered medications for this visit.     Physical Exam: Vitals:   05/09/17 1018  BP: 136/64  Pulse: (!) 56  Weight: 136 lb (61.7 kg)  Height: 5\' 5"  (1.651 m)    GEN- The patient is well appearing, alert and oriented x 3 today.   Head- normocephalic, atraumatic Eyes-  Sclera  clear, conjunctiva pink Ears- hearing intact Oropharynx- clear Lungs- Clear to ausculation bilaterally, normal work of breathing Heart- Regular rate and rhythm, no murmurs, rubs or gallops, PMI not laterally displaced GI- soft, NT, ND, + BS Extremities- no clubbing, cyanosis, or edema  EKG tracing ordered today is personally reviewed and shows sinus rhythm 56 bpm, incomplete RBBB  Assessment and Plan:  1. Persistent afib Doing well s/p ablation She is reluctant to stop flecainide but is having no afib. chads2vasc score is 2.  Continue xarelto No changes  Return in 4 months  Hillis RangeJames Bryley Kovacevic MD, [facility] Center[facility]ACC 05/09/2017 10:28 AM

## 2017-05-09 NOTE — Patient Instructions (Addendum)
Medication Instructions:  Your physician recommends that you continue on your current medications as directed. Please refer to the Current Medication list given to you today.   Labwork: None ordered   Testing/Procedures: None ordered   Follow-Up: Your physician recommends that you schedule a follow-up appointment in: 4 months with Dr. Allred   Any Other Special Instructions Will Be Listed Below (If Applicable).     If you need a refill on your cardiac medications before your next appointment, please call your pharmacy.   

## 2017-05-16 ENCOUNTER — Telehealth: Payer: Self-pay | Admitting: Internal Medicine

## 2017-05-16 NOTE — Telephone Encounter (Signed)
New message   Patient calling to request holter monitor. Please call  Patient c/o Palpitations:  High priority if patient c/o lightheadedness, shortness of breath, or chest pain  1) How long have you had palpitations/irregular HR/ Afib? Are you having the symptoms now? Several months of palpitations  2) Are you currently experiencing lightheadedness, SOB or CP? NO  3) Do you have a history of afib (atrial fibrillation) or irregular heart rhythm? yes  4) Have you checked your BP or HR? (document readings if available): "normal" per pt  5) Are you experiencing any other symptoms? Chest flutter

## 2017-05-16 NOTE — Telephone Encounter (Signed)
Discussed with Dr Johney FrameAllred and he recommends follow up with the afib clinic.  Will forward

## 2017-05-16 NOTE — Telephone Encounter (Signed)
Called and talked with pt states she keeps having these "heart racing episodes" that wake her up from sleep.  When she checks her heart rate it does not show elevated HR or BP. She only notices the episodes during the night. Suggested she try taking her diltiazem at bedtime and see if this helps smooth them out. If not she will let us know. Pt appreciative of the call back.

## 2017-05-29 ENCOUNTER — Ambulatory Visit (INDEPENDENT_AMBULATORY_CARE_PROVIDER_SITE_OTHER): Payer: 59

## 2017-05-29 ENCOUNTER — Encounter: Payer: Self-pay | Admitting: Family Medicine

## 2017-05-29 ENCOUNTER — Ambulatory Visit: Payer: 59 | Admitting: Family Medicine

## 2017-05-29 VITALS — BP 133/75 | HR 53 | Temp 97.0°F | Ht 65.0 in | Wt 145.0 lb

## 2017-05-29 DIAGNOSIS — M79671 Pain in right foot: Secondary | ICD-10-CM | POA: Diagnosis not present

## 2017-05-29 IMAGING — DX DG FOOT COMPLETE 3+V*R*
3 series · 3 of 3 positions shown · non-contrast
Comparison: None.

CLINICAL DATA: Right foot pain, swelling.  No known injury.

EXAM:
RIGHT FOOT COMPLETE - 3+ VIEW

[foot ap]
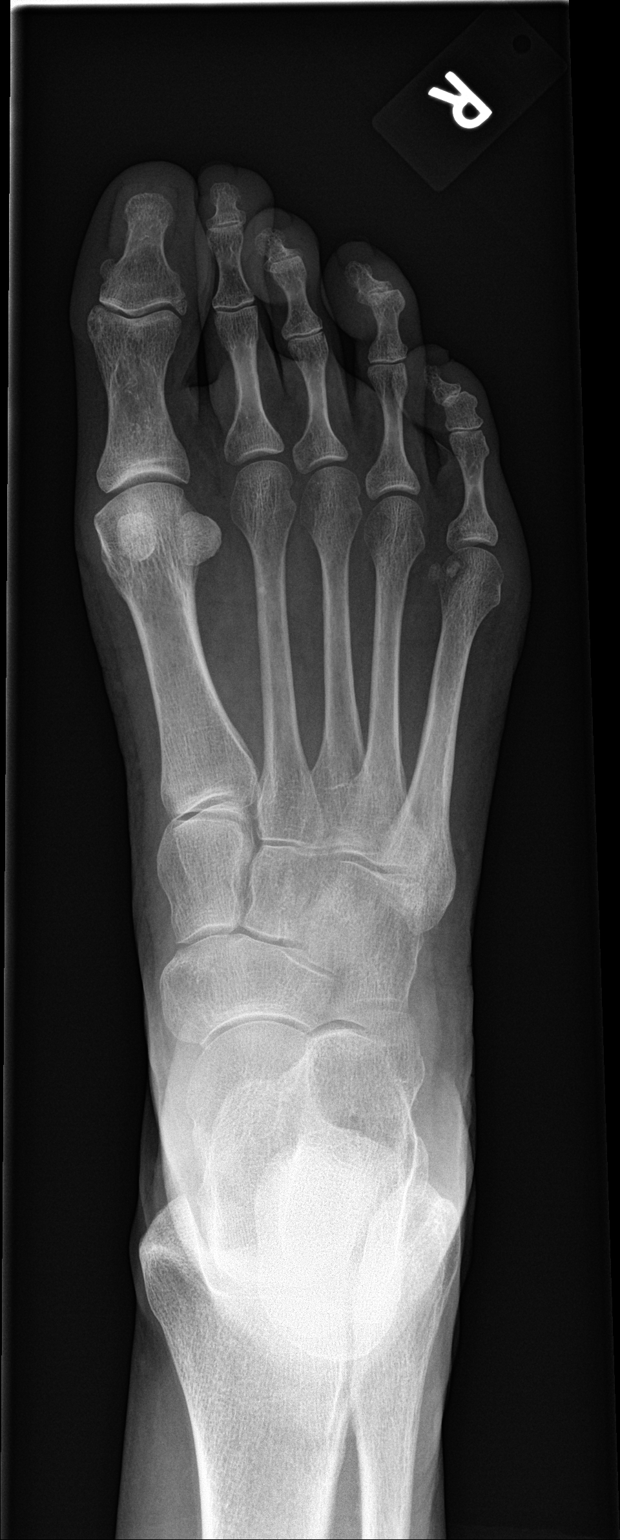

[foot obl]
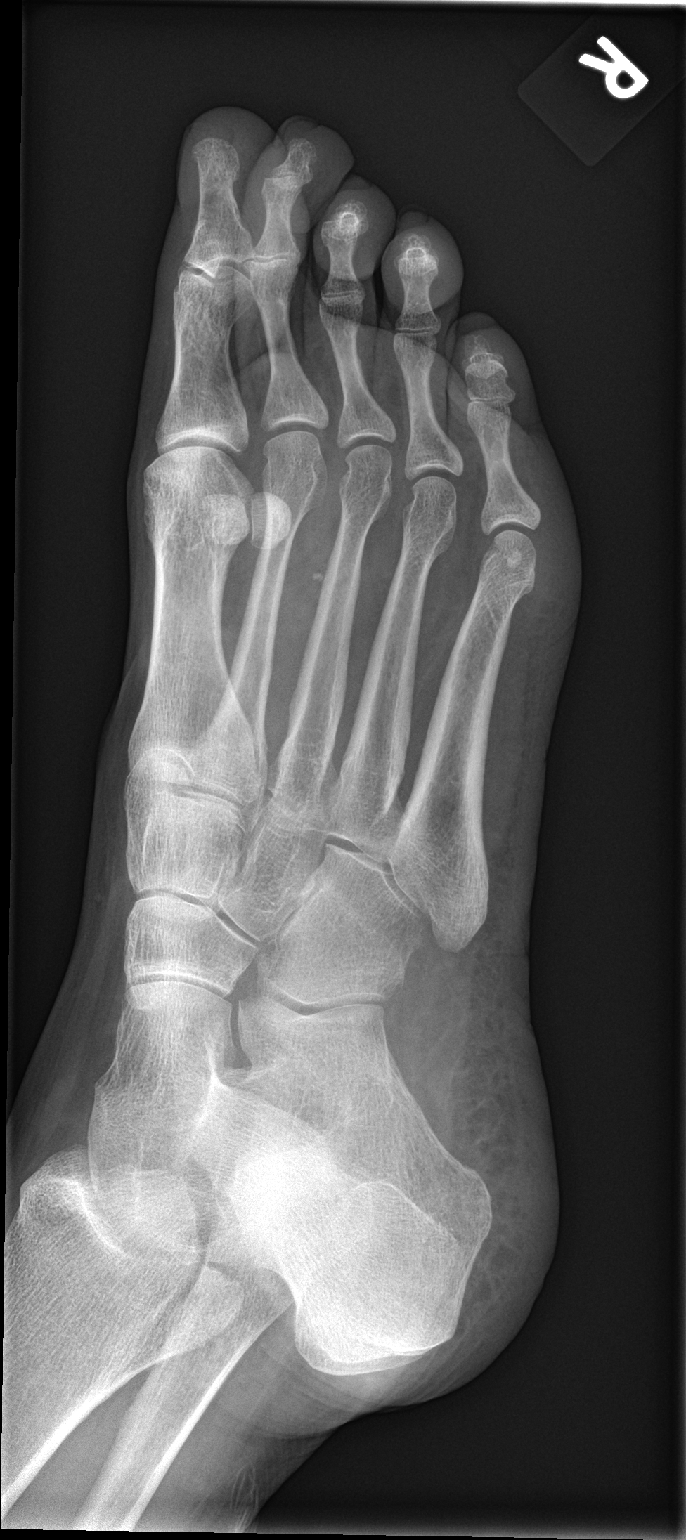

[foot lat]
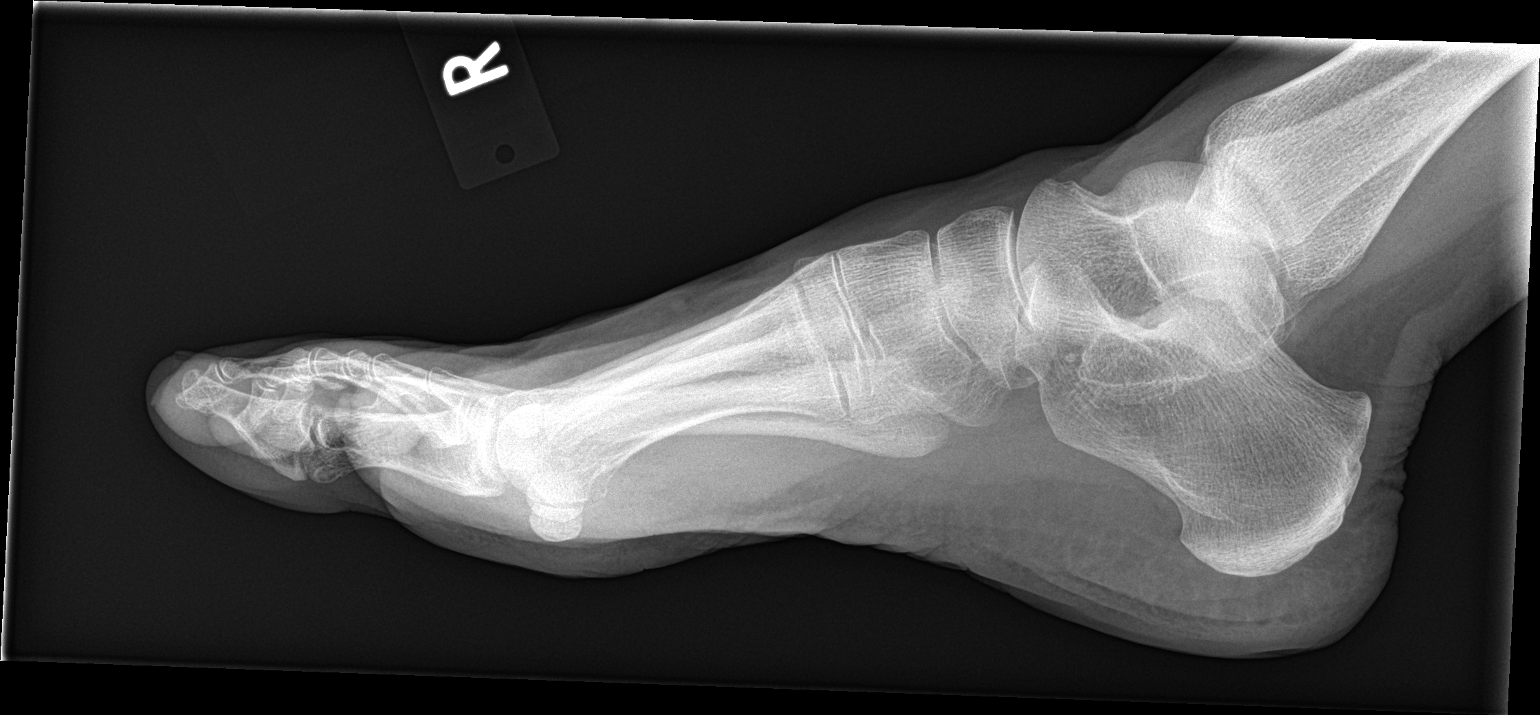

[3 of 3 positions shown; findings below may reference images not displayed]

FINDINGS: There is no evidence of fracture or dislocation. There is no
evidence of arthropathy or other focal bone abnormality. Soft
tissues are unremarkable.
IMPRESSION: Negative.

## 2017-05-29 NOTE — Patient Instructions (Signed)
Wear the Post Op shoe while on your feet for the next 2 weeks.

## 2017-05-29 NOTE — Progress Notes (Signed)
Subjective:  Patient ID: Kathryn Arnold, female    DOB: 1945-08-02  Age: 72 y.o. MRN: [id number]  CC: Foot Pain (pt here today for right foot pain and swelling, denies recent injury.)   HPI Kathryn BillsShirley L Arnold presents for Right foot pain with insidious increase over 2-3 weeks.  She has a previous fracture in the area of dorsal midfoot 10 years ago. Pain now at the dorsal mid to forefoot region.  Pain moderate 4-6/10 until throbbing yesterday. NKI. Hurts to walk.No redness or swelling.   Depression screen [facility] 2/9 05/29/2017 11/22/2016 09/15/2016  Decreased Interest 0 0 0  Down, Depressed, Hopeless 0 0 0  PHQ - 2 Score 0 0 0    History Kathryn Arnold has a past medical history of Abnormal Pap smear, Arthritis, Complication of anesthesia, HPV (human papilloma virus) infection (6/13), HSV-2 (herpes simplex virus 2) infection, Hypercholesteremia, Hyperlipidemia with target LDL less than 100, Hypertension, Osteoporosis, unspecified, Paroxysmal atrial fibrillation (HCC), PONV (postoperative nausea and vomiting), and Vitamin D deficiency.   She has a past surgical history that includes Dilation and curettage of uterus; Laparoscopy abdomen diagnostic (1990s); Cardiac catheterization (N/A, 11/03/2014); TEE without cardioversion (N/A, 10/30/2016); ATRIAL FIBRILLATION ABLATION (N/A, 10/31/2016); Breast lumpectomy (Left); and Tubal ligation (1978).   Her family history includes Cancer in her mother; Heart disease in her father; Hypertension in her mother; Osteoporosis in her mother.She reports that  has never smoked. she has never used smokeless tobacco. She reports that she does not drink alcohol or use drugs.    ROS Review of Systems  Constitutional: Positive for activity change. Negative for fever.  HENT: Negative for congestion.   Eyes: Negative for visual disturbance.  Respiratory: Negative for cough and shortness of breath.   Cardiovascular: Negative.   Gastrointestinal: Negative for abdominal pain.    Musculoskeletal: Positive for arthralgias and gait problem.    Objective:  BP 133/75   Pulse (!) 53   Temp (!) 97 F (36.1 C) (Oral)   Ht 5\' 5"  (1.651 m)   Wt 145 lb (65.8 kg)   BMI 24.13 kg/m   BP Readings from Last 3 Encounters:  05/29/17 133/75  05/09/17 136/64  02/05/17 130/70    Wt Readings from Last 3 Encounters:  05/29/17 145 lb (65.8 kg)  05/09/17 136 lb (61.7 kg)  02/05/17 140 lb 9.6 oz (63.8 kg)     Physical Exam  Constitutional: She is oriented to person, place, and time. She appears well-developed and well-nourished. No distress.  HENT:  Head: Normocephalic.  Neck: Neck supple.  Cardiovascular: Normal rate and regular rhythm.  No murmur heard. Pulmonary/Chest: Effort normal and breath sounds normal.  Abdominal: Soft.  Musculoskeletal: Normal range of motion. She exhibits tenderness (at dorsum of right midfootWhen the compressed at the distal aspect of the right third MTP joint). She exhibits no edema or deformity.  Neurological: She is alert and oriented to person, place, and time.  Skin: Skin is warm and dry.  Psychiatric: She has a normal mood and affect.    Foot x-ray: No fracture noted at the area of tenderness.  Assessment & Plan:   Kathryn Arnold was seen today for foot pain.  Diagnoses and all orders for this visit:  Right foot pain -     DG Foot Complete Right; Future   Patient is to wear the postop shoe for 2 weeks anytime her foot is in contact with the ground.  She can discontinue it at the end of that time if the  pain has resolved.  Otherwise she should follow-up.  She can use OTC Tylenol or ibuprofen as needed for pain relief.  She assures me she does not need anything stronger.  I am having Hula L. Huberty maintain her diltiazem, pantoprazole, ALPRAZolam, diltiazem, rivaroxaban, rosuvastatin, valACYclovir, and flecainide.  Allergies as of 05/29/2017   No Known Allergies     Medication List        Accurate as of 05/29/17  9:09 AM. Always  use your most recent med list.          ALPRAZolam 0.5 MG tablet Commonly known as:  XANAX Take 1 tablet (0.5 mg total) by mouth at bedtime.   diltiazem 120 MG 24 hr tablet Commonly known as:  CARDIZEM LA Take 1 tablet (120 mg total) by mouth daily.   diltiazem 60 MG tablet Commonly known as:  CARDIZEM Take 60 mg by mouth every 4 (four) hours as needed (for afib). Take 1/2 to 1 tablet by mouth every 4 hours AS NEEDED for heart rate >100 as long as blood pressure >100   flecainide 50 MG tablet Commonly known as:  TAMBOCOR TAKE 1 TABLET BY MOUTH TWICE DAILY   pantoprazole 40 MG tablet Commonly known as:  PROTONIX Take 1 tablet (40 mg total) by mouth daily.   rivaroxaban 20 MG Tabs tablet Commonly known as:  XARELTO Take 1 tablet (20 mg total) by mouth at bedtime.   rosuvastatin 20 MG tablet Commonly known as:  CRESTOR Take 1 tablet (20 mg total) by mouth at bedtime. Take 1 tablet by mouth  daily   valACYclovir 500 MG tablet Commonly known as:  VALTREX Take 1 tablet (500 mg total) by mouth daily with lunch.        Follow-up: No Follow-up on file.  Mechele Claude, M.D.

## 2017-06-01 ENCOUNTER — Encounter: Payer: Self-pay | Admitting: Family

## 2017-06-01 ENCOUNTER — Ambulatory Visit: Payer: 59 | Admitting: Family

## 2017-06-01 ENCOUNTER — Ambulatory Visit (INDEPENDENT_AMBULATORY_CARE_PROVIDER_SITE_OTHER): Payer: 59

## 2017-06-01 VITALS — BP 131/80 | HR 51 | Temp 97.0°F | Ht 65.0 in | Wt 144.2 lb

## 2017-06-01 DIAGNOSIS — I480 Paroxysmal atrial fibrillation: Secondary | ICD-10-CM

## 2017-06-01 DIAGNOSIS — F411 Generalized anxiety disorder: Secondary | ICD-10-CM

## 2017-06-01 DIAGNOSIS — Z1211 Encounter for screening for malignant neoplasm of colon: Secondary | ICD-10-CM

## 2017-06-01 DIAGNOSIS — E559 Vitamin D deficiency, unspecified: Secondary | ICD-10-CM

## 2017-06-01 DIAGNOSIS — M810 Age-related osteoporosis without current pathological fracture: Secondary | ICD-10-CM

## 2017-06-01 DIAGNOSIS — I10 Essential (primary) hypertension: Secondary | ICD-10-CM

## 2017-06-01 DIAGNOSIS — Z0000 Encounter for general adult medical examination without abnormal findings: Secondary | ICD-10-CM | POA: Diagnosis not present

## 2017-06-01 DIAGNOSIS — E785 Hyperlipidemia, unspecified: Secondary | ICD-10-CM

## 2017-06-01 DIAGNOSIS — Z23 Encounter for immunization: Secondary | ICD-10-CM | POA: Diagnosis not present

## 2017-06-01 NOTE — Progress Notes (Signed)
   Subjective:    Patient ID: Kathryn Arnold, female    DOB: [birth year], 72 y.o.   MRN: [id number]  Pt presents to the office today for CPE without pap. Pt is followed by Cardiologists for A Fib and had an ablation on 10/31/16. Pt states she had an episode in August 2018, but since has not "felt any and hope I never do!" Hypertension  This is a chronic problem. The current episode started more than 1 year ago. The problem has been resolved since onset. The problem is controlled. Pertinent negatives include no malaise/fatigue, peripheral edema or shortness of breath. Risk factors for coronary artery disease include dyslipidemia. The current treatment provides moderate improvement.  Hyperlipidemia  This is a chronic problem. The current episode started more than 1 year ago. The problem is controlled. Recent lipid tests were reviewed and are normal. Pertinent negatives include no shortness of breath. Current antihyperlipidemic treatment includes statins. The current treatment provides moderate improvement of lipids. Risk factors for coronary artery disease include dyslipidemia, hypertension and post-menopausal.  Osteoporosis  Pt states she took Fosamax for 5 years. Does not currently take any medications.    Review of Systems  Constitutional: Negative for malaise/fatigue.  Respiratory: Negative for shortness of breath.   All other systems reviewed and are negative.      Objective:   Physical Exam  Constitutional: She is oriented to person, place, and time. She appears well-developed and well-nourished. No distress.  HENT:  Head: Normocephalic and atraumatic.  Right Ear: External ear normal.  Left Ear: External ear normal.  Nose: Nose normal.  Mouth/Throat: Oropharynx is clear and moist.  Eyes: Pupils are equal, round, and reactive to light.  Neck: Normal range of motion. Neck supple. No thyromegaly present.  Cardiovascular: Regular rhythm, normal heart sounds and intact distal pulses.  Bradycardia present.  No murmur heard. Pulmonary/Chest: Effort normal and breath sounds normal. No respiratory distress. She has no wheezes.  Abdominal: Soft. Bowel sounds are normal. She exhibits no distension. There is no tenderness.  Musculoskeletal: Normal range of motion. She exhibits no edema or tenderness.  Neurological: She is alert and oriented to person, place, and time.  Skin: Skin is warm and dry.  Psychiatric: She has a normal mood and affect. Her behavior is normal. Judgment and thought content normal.  Vitals reviewed.    BP 131/80   Pulse (!) 51   Temp (!) 97 F (36.1 C) (Oral)   Ht _0  (1.651 m)   Wt 144 lb 3.2 oz (65.4 kg)   BMI 24.00 kg/m      Assessment & Plan:  1. Annual physical exam - CMP14+EGFR - Lipid panel - TSH - VITAMIN D 25 Hydroxy (Vit-D Deficiency, Fractures) - Anemia Profile B - Fecal occult blood, imunochemical; Future - DG [facility] DEXA  2. Essential hypertension, benign - CMP14+EGFR  3. Paroxysmal atrial fibrillation (HCC) - CMP14+EGFR  4. Hyperlipidemia with target LDL less than 100 - CMP14+EGFR  5. GAD (generalized anxiety disorder) - CMP14+EGFR  6. Vitamin D deficiency - CMP14+EGFR - VITAMIN D 25 Hydroxy (Vit-D Deficiency, Fractures)  7. Osteoporosis, unspecified osteoporosis type, unspecified pathological fracture presence - CMP14+EGFR - DG [facility] DEXA  8. Colon cancer screening - CMP14+EGFR - DG [facility] DEXA   Continue all meds Labs pending Health Maintenance reviewed Diet and exercise encouraged RTO 6 months   Evelina Dun, FNP

## 2017-06-01 NOTE — Addendum Note (Signed)
Addended by: Almeta MonasSTONE, JANIE M on: 06/01/2017 09:37 AM   Modules accepted: Orders

## 2017-06-01 NOTE — Patient Instructions (Signed)
Osteoporosis Osteoporosis is the thinning and loss of density in the bones. Osteoporosis makes the bones more brittle, fragile, and likely to break (fracture). Over time, osteoporosis can cause the bones to become so weak that they fracture after a simple fall. The bones most likely to fracture are the bones in the hip, wrist, and spine. What are the causes? The exact cause is not known. What increases the risk? Anyone can develop osteoporosis. You may be at greater risk if you have a family history of the condition or have poor nutrition. You may also have a higher risk if you are:  Female.  50 years old or older.  A smoker.  Not physically active.  White or Asian.  Slender.  What are the signs or symptoms? A fracture might be the first sign of the disease, especially if it results from a fall or injury that would not usually cause a bone to break. Other signs and symptoms include:  Low back and neck pain.  Stooped posture.  Height loss.  How is this diagnosed? To make a diagnosis, your health care provider may:  Take a medical history.  Perform a physical exam.  Order tests, such as: ? A bone mineral density test. ? A dual-energy X-ray absorptiometry test.  How is this treated? The goal of osteoporosis treatment is to strengthen your bones to reduce your risk of a fracture. Treatment may involve:  Making lifestyle changes, such as: ? Eating a diet rich in calcium. ? Doing weight-bearing and muscle-strengthening exercises. ? Stopping tobacco use. ? Limiting alcohol intake.  Taking medicine to slow the process of bone loss or to increase bone density.  Monitoring your levels of calcium and vitamin D.  Follow these instructions at home:  Include calcium and vitamin D in your diet. Calcium is important for bone health, and vitamin D helps the body absorb calcium.  Perform weight-bearing and muscle-strengthening exercises as directed by your health care  provider.  Do not use any tobacco products, including cigarettes, chewing tobacco, and electronic cigarettes. If you need help quitting, ask your health care provider.  Limit your alcohol intake.  Take medicines only as directed by your health care provider.  Keep all follow-up visits as directed by your health care provider. This is important.  Take precautions at home to lower your risk of falling, such as: ? Keeping rooms well lit and clutter free. ? Installing safety rails on stairs. ? Using rubber mats in the bathroom and other areas that are often wet or slippery. Get help right away if: You fall or injure yourself. This information is not intended to replace advice given to you by your health care provider. Make sure you discuss any questions you have with your health care provider. Document Released: 01/18/2005 Document Revised: 09/13/2015 Document Reviewed: 09/18/2013 Elsevier Interactive Patient Education  2018 Elsevier Inc.  

## 2017-06-02 LAB — CMP14+EGFR
ALK PHOS: 68 IU/L (ref 39–117)
ALT: 19 IU/L (ref 0–32)
AST: 28 IU/L (ref 0–40)
Albumin/Globulin Ratio: 1.8 (ref 1.2–2.2)
Albumin: 4.4 g/dL (ref 3.6–4.8)
BUN/Creatinine Ratio: 24 (ref 12–28)
BUN: 17 mg/dL (ref 8–27)
Bilirubin Total: 0.3 mg/dL (ref 0.0–1.2)
CO2: 23 mmol/L (ref 20–29)
CREATININE: 0.7 mg/dL (ref 0.57–1.00)
Calcium: 9.4 mg/dL (ref 8.7–10.3)
Chloride: 103 mmol/L (ref 96–106)
GFR calc Af Amer: 102 mL/min/{1.73_m2} (ref >59)
GFR calc non Af Amer: 89 mL/min/{1.73_m2} (ref >59)
GLOBULIN, TOTAL: 2.4 g/dL (ref 1.5–4.5)
GLUCOSE: 83 mg/dL (ref 65–99)
POTASSIUM: 4.7 mmol/L (ref 3.5–5.2)
SODIUM: 143 mmol/L (ref 134–144)
Total Protein: 6.8 g/dL (ref 6.0–8.5)

## 2017-06-02 LAB — ANEMIA PROFILE B
Basophils Absolute: 0 10*3/uL (ref 0.0–0.2)
Basos: 0 %
EOS (ABSOLUTE): 0.1 10*3/uL (ref 0.0–0.4)
EOS: 1 %
FOLATE: 15.3 ng/mL (ref >3.0)
Ferritin: 67 ng/mL (ref 15–150)
HEMOGLOBIN: 14 g/dL (ref 11.1–15.9)
Hematocrit: 42.2 % (ref 34.0–46.6)
IRON SATURATION: 24 % (ref 15–55)
IRON: 84 ug/dL (ref 27–139)
Immature Grans (Abs): 0 10*3/uL (ref 0.0–0.1)
Immature Granulocytes: 0 %
LYMPHS ABS: 1.6 10*3/uL (ref 0.7–3.1)
Lymphs: 36 %
MCH: 30 pg (ref 26.6–33.0)
MCHC: 33.2 g/dL (ref 31.5–35.7)
MCV: 91 fL (ref 79–97)
Monocytes Absolute: 0.3 10*3/uL (ref 0.1–0.9)
Monocytes: 7 %
NEUTROS ABS: 2.4 10*3/uL (ref 1.4–7.0)
Neutrophils: 56 %
Platelets: 229 10*3/uL (ref 150–379)
RBC: 4.66 x10E6/uL (ref 3.77–5.28)
RDW: 13 % (ref 12.3–15.4)
Retic Ct Pct: 1 % (ref 0.6–2.6)
TIBC: 352 ug/dL (ref 250–450)
UIBC: 268 ug/dL (ref 118–369)
VITAMIN B 12: 438 pg/mL (ref 232–1245)
WBC: 4.5 10*3/uL (ref 3.4–10.8)

## 2017-06-02 LAB — VITAMIN D 25 HYDROXY (VIT D DEFICIENCY, FRACTURES): Vit D, 25-Hydroxy: 30.5 ng/mL (ref 30.0–100.0)

## 2017-06-02 LAB — LIPID PANEL
Chol/HDL Ratio: 2.5 ratio (ref 0.0–4.4)
Cholesterol, Total: 128 mg/dL (ref 100–199)
HDL: 51 mg/dL (ref >39)
LDL CALC: 61 mg/dL (ref 0–99)
TRIGLYCERIDES: 80 mg/dL (ref 0–149)
VLDL Cholesterol Cal: 16 mg/dL (ref 5–40)

## 2017-06-02 LAB — TSH: TSH: 1.12 u[IU]/mL (ref 0.450–4.500)

## 2017-06-06 ENCOUNTER — Telehealth: Payer: Self-pay | Admitting: *Deleted

## 2017-06-06 ENCOUNTER — Other Ambulatory Visit: Payer: Self-pay | Admitting: Family

## 2017-06-06 NOTE — Telephone Encounter (Signed)
Patient aware of last lab results and form from her physical has been faxed to her insurance.  Please review dexa scan results so patient may know results.

## 2017-06-07 ENCOUNTER — Other Ambulatory Visit: Payer: 59

## 2017-06-07 DIAGNOSIS — Z0000 Encounter for general adult medical examination without abnormal findings: Secondary | ICD-10-CM

## 2017-06-08 LAB — FECAL OCCULT BLOOD, IMMUNOCHEMICAL: FECAL OCCULT BLD: NEGATIVE

## 2017-06-30 ENCOUNTER — Other Ambulatory Visit: Payer: Self-pay | Admitting: Family

## 2017-06-30 DIAGNOSIS — I480 Paroxysmal atrial fibrillation: Secondary | ICD-10-CM

## 2017-07-02 ENCOUNTER — Telehealth (HOSPITAL_COMMUNITY): Payer: Self-pay | Admitting: *Deleted

## 2017-07-02 NOTE — Telephone Encounter (Signed)
Patient called in stating her HR is in AFIB 160s. She took her normal dosing of flecainide. She just took a 60mg  cardizem. Instructed pt to relax and let the cardizem work - she can repeat in 5 hours if needed for elevated HR. ER precautions reviewed with patient.

## 2017-08-17 ENCOUNTER — Encounter: Payer: Self-pay | Admitting: Internal Medicine

## 2017-08-28 ENCOUNTER — Encounter: Payer: Self-pay | Admitting: *Deleted

## 2017-08-28 NOTE — Progress Notes (Signed)
Summary of benefits received. Patient would owe 40% of Prolia and 40% of office visit/facility fee which would be over $400. Spoke with patient and she had already decided before hearing the cost that she did not want to start Prolia. She plans to increase her weight bearing exercise and take Calcium/vit D supplements. She will f/u if needed.

## 2017-09-05 ENCOUNTER — Ambulatory Visit: Payer: 59 | Admitting: Internal Medicine

## 2017-09-05 ENCOUNTER — Encounter: Payer: Self-pay | Admitting: Internal Medicine

## 2017-09-05 ENCOUNTER — Other Ambulatory Visit (HOSPITAL_COMMUNITY): Payer: Self-pay | Admitting: Nurse Practitioner

## 2017-09-05 ENCOUNTER — Encounter (INDEPENDENT_AMBULATORY_CARE_PROVIDER_SITE_OTHER): Payer: Self-pay

## 2017-09-05 VITALS — BP 132/72 | HR 54 | Ht 65.0 in | Wt 151.0 lb

## 2017-09-05 DIAGNOSIS — I481 Persistent atrial fibrillation: Secondary | ICD-10-CM

## 2017-09-05 DIAGNOSIS — I4819 Other persistent atrial fibrillation: Secondary | ICD-10-CM

## 2017-09-05 NOTE — Progress Notes (Signed)
PCP: Junie Spencer, FNP   Primary EP: Dr Johney Frame  Kathryn Arnold is a 72 y.o. female who presents today for routine electrophysiology followup.  Since last being seen in our clinic, the patient reports doing very well.  + rare and short palpitations.  Today, she denies symptoms of chest pain, shortness of breath,  lower extremity edema, dizziness, presyncope, or syncope.  The patient is otherwise without complaint today.   Past Medical History:  Diagnosis Date  . Abnormal Pap smear   . Arthritis    "fingers" (10/31/2016)  . Complication of anesthesia    a little slow to wake up-stayed drowsy  . 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 HSV-2 (herpes simplex virus 2) infection   . Hypercholesteremia   . Hyperlipidemia with target LDL less than 100   . Hypertension   . Osteoporosis, unspecified    Last DEXA 10/2011   . Paroxysmal atrial fibrillation (HCC)    s/p ablation Afib and flutter 11/03/14  . PONV (postoperative nausea and vomiting)   . Vitamin D deficiency    Past Surgical History:  Procedure Laterality Date  . ATRIAL FIBRILLATION ABLATION N/A 10/31/2016   Procedure: Atrial Fibrillation Ablation;  Surgeon: Hillis Range, MD;  Location: [facility] INVASIVE CV LAB;  Service: Cardiovascular;  Laterality: N/A;  . BREAST LUMPECTOMY Left   . DILATION AND CURETTAGE OF UTERUS    . ELECTROPHYSIOLOGIC STUDY N/A 11/03/2014   Procedure: Atrial Fibrillation Ablation;  Surgeon: Hillis Range, MD;  Location[facility] INVASIVE CV LAB;  Service: Cardiovascular;  Laterality: N/A;  . LAPAROSCOPY ABDOMEN DIAGNOSTIC  1990s  . TEE WITHOUT CARDIOVERSION N/A 10/30/2016   Procedure: TRANSESOPHAGEAL ECHOCARDIOGRAM (TEE);  Surgeon: Thurmon Fair, MD;  Location: [facility] ENDOSCOPY;  Service: Cardiovascular;  Laterality: N/A;  . TUBAL LIGATION  1978    ROS- all systems are reviewed and negatives except as per HPI above  Current Outpatient Medications  Medication Sig Dispense Refill  . ALPRAZolam (XANAX) 0.5 MG  tablet Take 1 tablet (0.5 mg total) by mouth at bedtime. (Patient taking differently: Take 0.5 mg by mouth daily as needed for anxiety or sleep. ) 30 tablet 3  . diltiazem (CARDIZEM LA) 120 MG 24 hr tablet Take 1 tablet (120 mg total) by mouth daily. 90 tablet 2  . diltiazem (CARDIZEM) 60 MG tablet Take 60 mg by mouth every 4 (four) hours as needed (for afib). Take 1/2 to 1 tablet by mouth every 4 hours AS NEEDED for heart rate >100 as long as blood pressure >100     . flecainide (TAMBOCOR) 50 MG tablet TAKE 1 TABLET BY MOUTH TWICE DAILY 60 tablet 3  . pantoprazole (PROTONIX) 40 MG tablet Take 1 tablet (40 mg total) by mouth daily. 45 tablet 0  . rosuvastatin (CRESTOR) 20 MG tablet Take 1 tablet (20 mg total) by mouth at bedtime. Take 1 tablet by mouth  daily 90 tablet 1  . valACYclovir (VALTREX) 500 MG tablet Take 1 tablet (500 mg total) by mouth daily with lunch. 90 tablet 1  . XARELTO 20 MG TABS tablet TAKE 1 TABLET BY MOUTH ONCE DAILY AT BEDTIME 90 tablet 0   No current facility-administered medications for this visit.     Physical Exam: Vitals:   09/05/17 1000  BP: 132/72  Pulse: (!) 54  Weight: 151 lb (68.5 kg)  Height:  (1.651 m)    GEN- The patient is well appearing, alert and oriented x 3 today.   Head- normocephalic,  atraumatic Eyes-  Sclera clear, conjunctiva pink Ears- hearing intact Oropharynx- clear Lungs- Clear to ausculation bilaterally, normal work of breathing Heart- Regular rate and rhythm, no murmurs, rubs or gallops, PMI not laterally displaced GI- soft, NT, ND, + BS Extremities- no clubbing, cyanosis, or edema  EKG tracing ordered today is personally reviewed and shows sinus rhythm  Assessment and Plan:  1. Persistent afib Rare palpitations and reluctant to stop AAD therapy post ablation Very pleased with results of AF ablation I have advised ILR for long term AF management post ablation.  At this time, she would prefer a more conservative approach.   She will contact my office if she changes her mind chads2vasc score is 2.  Continue xarelto  Return in 6 months  Hillis Range MD, [facility] 09/05/2017 10:48 AM

## 2017-09-05 NOTE — Patient Instructions (Addendum)

## 2017-09-10 ENCOUNTER — Telehealth (HOSPITAL_COMMUNITY): Payer: Self-pay | Admitting: *Deleted

## 2017-09-10 ENCOUNTER — Other Ambulatory Visit (HOSPITAL_COMMUNITY): Payer: Self-pay | Admitting: *Deleted

## 2017-09-10 DIAGNOSIS — I4819 Other persistent atrial fibrillation: Secondary | ICD-10-CM

## 2017-09-10 NOTE — Telephone Encounter (Signed)
Patient called in stating she would like to wear a 30 day event monitor before committing [facility] [employer] placement. Ok per Rudi Coco, NP to place 30 day event monitor.

## 2017-09-25 ENCOUNTER — Other Ambulatory Visit: Payer: Self-pay | Admitting: Family

## 2017-09-25 DIAGNOSIS — E785 Hyperlipidemia, unspecified: Secondary | ICD-10-CM

## 2017-09-26 ENCOUNTER — Telehealth: Payer: Self-pay | Admitting: Internal Medicine

## 2017-09-26 NOTE — Telephone Encounter (Signed)
09/26/17 called pt to reschedule event monitor and she stated she is not sure she wants to wear it and her insurance will not cover it, she wants to think about it some more and will call us if she wants to make appt.  Talked with Boneta LucksJenny Dr Allred's nurse and she said to defer for a year and if by then she has not called back we can remove the order.lm

## 2017-09-26 NOTE — Telephone Encounter (Signed)
Ordered by Afib clinic.  Will notify.  No further action at this time.

## 2017-10-01 ENCOUNTER — Other Ambulatory Visit: Payer: Self-pay | Admitting: Family

## 2017-10-01 DIAGNOSIS — B009 Herpesviral infection, unspecified: Secondary | ICD-10-CM

## 2017-10-06 ENCOUNTER — Other Ambulatory Visit: Payer: Self-pay | Admitting: Family

## 2017-10-06 DIAGNOSIS — I480 Paroxysmal atrial fibrillation: Secondary | ICD-10-CM

## 2017-10-08 NOTE — Telephone Encounter (Signed)
Last seen 06/01/17

## 2017-10-11 ENCOUNTER — Encounter: Payer: Self-pay | Admitting: Family

## 2017-10-11 ENCOUNTER — Ambulatory Visit: Payer: 59 | Admitting: Family

## 2017-10-11 VITALS — BP 133/77 | HR 61 | Temp 97.3°F | Ht 65.0 in | Wt 152.6 lb

## 2017-10-11 DIAGNOSIS — J029 Acute pharyngitis, unspecified: Secondary | ICD-10-CM

## 2017-10-11 LAB — CULTURE, GROUP A STREP

## 2017-10-11 LAB — RAPID STREP SCREEN (MED CTR MEBANE ONLY): STREP GP A AG, IA W/REFLEX: NEGATIVE

## 2017-10-11 MED ORDER — FLUTICASONE PROPIONATE 50 MCG/ACT NA SUSP
2.0000 | Freq: Every day | NASAL | 6 refills | Status: DC
Start: 2017-10-11 — End: 2019-06-06

## 2017-10-11 NOTE — Patient Instructions (Signed)

## 2017-10-11 NOTE — Progress Notes (Signed)
Subjective:    Patient ID: Kathryn Arnold, female    DOB: 1945-05-20, 72 y.o.   MRN: [id number]  Chief Complaint  Patient presents with  . hurts down in throat    Sore Throat   This is a new problem. The current episode started yesterday. The problem has been gradually worsening. There has been no fever. The pain is at a severity of 8/10. The pain is moderate. Associated symptoms include congestion, ear pain, swollen glands and trouble swallowing. Pertinent negatives include no coughing, ear discharge, headaches, hoarse voice or plugged ear sensation. She has tried nothing for the symptoms. The treatment provided no relief.      Review of Systems  HENT: Positive for congestion, ear pain and trouble swallowing. Negative for ear discharge and hoarse voice.   Respiratory: Negative for cough.   Neurological: Negative for headaches.  All other systems reviewed and are negative.      Objective:   Physical Exam  Constitutional: She is oriented to person, place, and time. She appears well-developed and well-nourished. No distress.  HENT:  Head: Normocephalic and atraumatic.  Right Ear: External ear normal.  Left Ear: External ear normal.  Nose: Rhinorrhea present.  Mouth/Throat: Posterior oropharyngeal erythema present.  Eyes: Pupils are equal, round, and reactive to light.  Neck: Normal range of motion. Neck supple. No thyromegaly present.  Cardiovascular: Normal rate, regular rhythm, normal heart sounds and intact distal pulses.  No murmur heard. Pulmonary/Chest: Effort normal and breath sounds normal. No respiratory distress. She has no wheezes.  Abdominal: Soft. Bowel sounds are normal. She exhibits no distension. There is no tenderness.  Musculoskeletal: Normal range of motion. She exhibits no edema or tenderness.  Neurological: She is alert and oriented to person, place, and time. She has normal reflexes. No cranial nerve deficit.  Skin: Skin is warm and dry.  Psychiatric:  She has a normal mood and affect. Her behavior is normal. Judgment and thought content normal.  Vitals reviewed.    BP 133/77   Pulse 61   Temp (!) 97.3 F (36.3 C) (Oral)   Ht 5\' 5"  (1.651 m)   Wt 152 lb 9.6 oz (69.2 kg)   BMI 25.39 kg/m      Assessment & Plan:  Kathryn Arnold was seen today for hurts down in throat.  Diagnoses and all orders for this visit:  Sore throat -     Rapid Strep Screen ([facility] & [facility] ONLY) -     fluticasone (FLONASE) 50 MCG/ACT nasal spray; Place 2 sprays into both nostrils daily.  Pharyngitis, unspecified etiology -     fluticasone (FLONASE) 50 MCG/ACT nasal spray; Place 2 sprays into both nostrils daily.   - Take meds as prescribed - Use a cool mist humidifier  -Use saline nose sprays frequently -Force fluids -For any cough or congestion  Use plain Mucinex- regular strength or max strength is fine -For fever or aces or pains- take tylenol or ibuprofen. -Throat lozenges if help -New toothbrush in 3 days   [name], FNP

## 2017-11-29 ENCOUNTER — Encounter: Payer: Self-pay | Admitting: Family

## 2017-11-29 ENCOUNTER — Ambulatory Visit: Payer: 59 | Admitting: Family

## 2017-11-29 VITALS — BP 119/63 | HR 48 | Temp 97.4°F | Ht 65.0 in | Wt 154.0 lb

## 2017-11-29 DIAGNOSIS — B009 Herpesviral infection, unspecified: Secondary | ICD-10-CM | POA: Diagnosis not present

## 2017-11-29 DIAGNOSIS — E785 Hyperlipidemia, unspecified: Secondary | ICD-10-CM

## 2017-11-29 DIAGNOSIS — I480 Paroxysmal atrial fibrillation: Secondary | ICD-10-CM | POA: Diagnosis not present

## 2017-11-29 DIAGNOSIS — I10 Essential (primary) hypertension: Secondary | ICD-10-CM

## 2017-11-29 DIAGNOSIS — M1990 Unspecified osteoarthritis, unspecified site: Secondary | ICD-10-CM

## 2017-11-29 DIAGNOSIS — E559 Vitamin D deficiency, unspecified: Secondary | ICD-10-CM

## 2017-11-29 DIAGNOSIS — F411 Generalized anxiety disorder: Secondary | ICD-10-CM

## 2017-11-29 MED ORDER — VALACYCLOVIR HCL 500 MG PO TABS: ORAL_TABLET | ORAL | 1 refills | Status: DC

## 2017-11-29 MED ORDER — ROSUVASTATIN CALCIUM 20 MG PO TABS: 20.0000 mg | ORAL_TABLET | Freq: Every day | ORAL | 1 refills | Status: DC

## 2017-11-29 MED ORDER — RIVAROXABAN 20 MG PO TABS: 20.0000 mg | ORAL_TABLET | Freq: Every day | ORAL | 1 refills | Status: DC

## 2017-11-29 NOTE — Progress Notes (Signed)
Subjective:    Patient ID: Kathryn Arnold, female    DOB: November 01, 1945, 72 y.o.   MRN: [id number]  Chief Complaint  Patient presents with  . Medical Management of Chronic Issues   Pt presents to the office today for chronic follow up. Pt is followed by Cardiologists for A Fib and had an ablation on 10/31/16. Pt states she has had a few episodes of A Fib over the last month. States she is doing well.   Hypertension  This is a chronic problem. The current episode started more than 1 year ago. The problem has been resolved since onset. The problem is controlled. Associated symptoms include anxiety. Pertinent negatives include no malaise/fatigue, peripheral edema or shortness of breath. Risk factors for coronary artery disease include dyslipidemia. Past treatments include calcium channel blockers. The current treatment provides moderate improvement. There is no history of kidney disease or heart failure.  Arthritis  Presents for follow-up visit. She complains of stiffness. The symptoms have been resolved. Her pain is at a severity of 0/10. Pertinent negatives include no fatigue.  Anxiety  Presents for follow-up visit. Symptoms include excessive worry, irritability, nervous/anxious behavior and restlessness. Patient reports no shortness of breath. The severity of symptoms is moderate.    Hyperlipidemia  This is a chronic problem. The current episode started more than 1 year ago. The problem is controlled. Recent lipid tests were reviewed and are normal. Pertinent negatives include no shortness of breath. Current antihyperlipidemic treatment includes statins. The current treatment provides moderate improvement of lipids. Risk factors for coronary artery disease include dyslipidemia, hypertension and a sedentary lifestyle.  Osteoporosis  PT's last Dexa Scan was 06/01/17. States she could not afford Prolia.     Review of Systems  Constitutional: Positive for irritability. Negative for fatigue and  malaise/fatigue.  Respiratory: Negative for shortness of breath.   Musculoskeletal: Positive for arthritis and stiffness.  Psychiatric/Behavioral: The patient is nervous/anxious.   All other systems reviewed and are negative.      Objective:   Physical Exam  Constitutional: She is oriented to person, place, and time. She appears well-developed and well-nourished. No distress.  HENT:  Head: Normocephalic and atraumatic.  Right Ear: External ear normal.  Left Ear: External ear normal.  Mouth/Throat: Oropharynx is clear and moist.  Eyes: Pupils are equal, round, and reactive to light.  Neck: Normal range of motion. Neck supple. No thyromegaly present.  Cardiovascular: Normal rate, regular rhythm, normal heart sounds and intact distal pulses.  No murmur heard. Pulmonary/Chest: Effort normal and breath sounds normal. No respiratory distress. She has no wheezes.  Abdominal: Soft. Bowel sounds are normal. She exhibits no distension. There is no tenderness.  Musculoskeletal: Normal range of motion. She exhibits no edema or tenderness.  Neurological: She is alert and oriented to person, place, and time. She has normal reflexes. No cranial nerve deficit.  Skin: Skin is warm and dry.  Psychiatric: She has a normal mood and affect. Her behavior is normal. Judgment and thought content normal.  Vitals reviewed.     BP 119/63   Pulse (!) 48   Temp (!) 97.4 F (36.3 C) (Oral)   Ht _0  (1.651 m)   Wt 154 lb (69.9 kg)   BMI 25.63 kg/m      Assessment & Plan:  Kathryn Arnold comes in today with chief complaint of Medical Management of Chronic Issues   Diagnosis and orders addressed:  1. Hyperlipidemia with target LDL less than 100 - rosuvastatin (  CRESTOR) 20 MG tablet; Take 1 tablet (20 mg total) by mouth at bedtime.  Dispense: 90 tablet; Refill: 1 - CMP14+EGFR  2. HSV-2 (herpes simplex virus 2) infection - valACYclovir (VALTREX) 500 MG tablet; TAKE 1 TABLET BY MOUTH ONCE DAILY  WITH LUNCH  Dispense: 90 tablet; Refill: 1 - CMP14+EGFR  3. Paroxysmal atrial fibrillation (HCC) - rivaroxaban (XARELTO) 20 MG TABS tablet; Take 1 tablet (20 mg total) by mouth at bedtime.  Dispense: 90 tablet; Refill: 1 - CMP14+EGFR  4. Essential hypertension, benign - CMP14+EGFR  5. Arthritis - CMP14+EGFR  6. GAD (generalized anxiety disorder) - CMP14+EGFR  7. Vitamin D deficiency - CMP14+EGFR   Labs pending Health Maintenance reviewed Diet and exercise encouraged  Follow up plan: 6 months    Evelina Dun, FNP

## 2017-11-29 NOTE — Patient Instructions (Signed)
Osteoporosis Osteoporosis is the thinning and loss of density in the bones. Osteoporosis makes the bones more brittle, fragile, and likely to break (fracture). Over time, osteoporosis can cause the bones to become so weak that they fracture after a simple fall. The bones most likely to fracture are the bones in the hip, wrist, and spine. What are the causes? The exact cause is not known. What increases the risk? Anyone can develop osteoporosis. You may be at greater risk if you have a family history of the condition or have poor nutrition. You may also have a higher risk if you are:  Female.  50 years old or older.  A smoker.  Not physically active.  White or Asian.  Slender.  What are the signs or symptoms? A fracture might be the first sign of the disease, especially if it results from a fall or injury that would not usually cause a bone to break. Other signs and symptoms include:  Low back and neck pain.  Stooped posture.  Height loss.  How is this diagnosed? To make a diagnosis, your health care provider may:  Take a medical history.  Perform a physical exam.  Order tests, such as: ? A bone mineral density test. ? A dual-energy X-ray absorptiometry test.  How is this treated? The goal of osteoporosis treatment is to strengthen your bones to reduce your risk of a fracture. Treatment may involve:  Making lifestyle changes, such as: ? Eating a diet rich in calcium. ? Doing weight-bearing and muscle-strengthening exercises. ? Stopping tobacco use. ? Limiting alcohol intake.  Taking medicine to slow the process of bone loss or to increase bone density.  Monitoring your levels of calcium and vitamin D.  Follow these instructions at home:  Include calcium and vitamin D in your diet. Calcium is important for bone health, and vitamin D helps the body absorb calcium.  Perform weight-bearing and muscle-strengthening exercises as directed by your health care  provider.  Do not use any tobacco products, including cigarettes, chewing tobacco, and electronic cigarettes. If you need help quitting, ask your health care provider.  Limit your alcohol intake.  Take medicines only as directed by your health care provider.  Keep all follow-up visits as directed by your health care provider. This is important.  Take precautions at home to lower your risk of falling, such as: ? Keeping rooms well lit and clutter free. ? Installing safety rails on stairs. ? Using rubber mats in the bathroom and other areas that are often wet or slippery. Get help right away if: You fall or injure yourself. This information is not intended to replace advice given to you by your health care provider. Make sure you discuss any questions you have with your health care provider. Document Released: 01/18/2005 Document Revised: 09/13/2015 Document Reviewed: 09/18/2013 Elsevier Interactive Patient Education  2018 Elsevier Inc.  

## 2017-11-30 LAB — CMP14+EGFR
ALT: 18 IU/L (ref 0–32)
AST: 22 IU/L (ref 0–40)
Albumin/Globulin Ratio: 2.2 (ref 1.2–2.2)
Albumin: 4.6 g/dL (ref 3.5–4.8)
Alkaline Phosphatase: 57 IU/L (ref 39–117)
BUN/Creatinine Ratio: 22 (ref 12–28)
BUN: 17 mg/dL (ref 8–27)
Bilirubin Total: 0.4 mg/dL (ref 0.0–1.2)
CALCIUM: 9.7 mg/dL (ref 8.7–10.3)
CO2: 23 mmol/L (ref 20–29)
CREATININE: 0.76 mg/dL (ref 0.57–1.00)
Chloride: 106 mmol/L (ref 96–106)
GFR, EST AFRICAN AMERICAN: 92 mL/min/{1.73_m2} (ref >59)
GFR, EST NON AFRICAN AMERICAN: 80 mL/min/{1.73_m2} (ref >59)
Globulin, Total: 2.1 g/dL (ref 1.5–4.5)
Glucose: 106 mg/dL — ABNORMAL HIGH (ref 65–99)
Potassium: 4.3 mmol/L (ref 3.5–5.2)
SODIUM: 143 mmol/L (ref 134–144)
TOTAL PROTEIN: 6.7 g/dL (ref 6.0–8.5)

## 2017-12-04 ENCOUNTER — Other Ambulatory Visit: Payer: Self-pay | Admitting: Family

## 2017-12-04 ENCOUNTER — Telehealth: Payer: Self-pay | Admitting: Internal Medicine

## 2017-12-04 ENCOUNTER — Telehealth: Payer: Self-pay | Admitting: Family

## 2017-12-04 MED ORDER — ALENDRONATE SODIUM 70 MG PO TABS: 70.0000 mg | ORAL_TABLET | ORAL | 11 refills | Status: DC

## 2017-12-04 NOTE — Telephone Encounter (Signed)
Advised Pt to call her PCP if she would like a referral to Dr. Sampson GoonFitzgerald.  Pt indicates understanding.

## 2017-12-04 NOTE — Progress Notes (Signed)
I have sent in Fosamax Prescription sent to pharmacy. Continue Calcium and Vit D. Since Prolia was too costly and you have been on break from Fosamax we will restart.

## 2017-12-04 NOTE — Telephone Encounter (Signed)
New Message:      Pt is calling and requesting for a referral to go to Dr. Clydie Braunavid Fitzgerald for a 2nd opinion at the [facility].

## 2017-12-04 NOTE — Telephone Encounter (Signed)
What type of referral do you need? Pt wants a second opinion and wants to be referred a different Cardiologist she has been seeing [name] wants to see Dr. Clydie Braunavid Fitzgerald [facility]  Have you been seen at our office for this problem? yes (If no, schedule them an appointment.  They will need to be seen before a referral can be done.)  Is there a particular doctor or location that you prefer? Dr. Sampson GoonFitzgerald   Patient notified that referrals can take up to a week or longer to process. If they haven't heard anything within a week they should call back and speak with the referral department.

## 2017-12-05 ENCOUNTER — Telehealth: Payer: Self-pay

## 2017-12-05 DIAGNOSIS — I10 Essential (primary) hypertension: Secondary | ICD-10-CM

## 2017-12-05 DIAGNOSIS — I480 Paroxysmal atrial fibrillation: Secondary | ICD-10-CM

## 2017-12-05 NOTE — Telephone Encounter (Signed)
Patient wants referral for 2nd opinion cardiology   Seeing Dr Allred  Wants to be referred to Dr Johney Framelydie Braunavid Fitzgerald [facility]

## 2017-12-05 NOTE — Progress Notes (Signed)
Patient aware.

## 2017-12-06 NOTE — Addendum Note (Signed)
Addended by: Jannifer RodneyHAWKS, Demetruis Depaul A on: 12/06/2017 08:17 AM   Modules accepted: Orders

## 2017-12-06 NOTE — Telephone Encounter (Signed)
Referral made 

## 2017-12-07 ENCOUNTER — Ambulatory Visit: Payer: 59 | Admitting: Family Medicine

## 2017-12-07 ENCOUNTER — Encounter: Payer: Self-pay | Admitting: Family Medicine

## 2017-12-07 VITALS — BP 156/85 | HR 59 | Temp 98.0°F | Ht 65.0 in | Wt 156.0 lb

## 2017-12-07 DIAGNOSIS — R42 Dizziness and giddiness: Secondary | ICD-10-CM | POA: Diagnosis not present

## 2017-12-07 LAB — GLUCOSE HEMOCUE WAIVED: GLU HEMOCUE WAIVED: 109 mg/dL — AB (ref 65–99)

## 2017-12-07 MED ORDER — MECLIZINE HCL 25 MG PO TABS: 12.5000 mg | ORAL_TABLET | Freq: Three times a day (TID) | ORAL | 0 refills | Status: DC | PRN

## 2017-12-07 NOTE — Patient Instructions (Signed)
You had labs performed today.  You will be contacted with the results of the labs once they are available, usually in the next 3 business days for routine lab work.   I have prescribed you a medication called meclizine you may use 1/2-1 full tablet every 8 hours if needed for dizziness.  Make sure you are getting plenty of fluids.  Change positions slowly.  If your symptoms worsen or he develop any neurologic deficits, please seek immediate medical attention the emergency department.   Dizziness Dizziness is a common problem. It makes you feel unsteady or light-headed. You may feel like you are about to pass out (faint). Dizziness can lead to getting hurt if you stumble or fall. Dizziness can be caused by many things, including:  Medicines.  Not having enough water in your body (dehydration).  Illness.  Follow these instructions at home: Eating and drinking  Drink enough fluid to keep your pee (urine) clear or pale yellow. This helps to keep you from getting dehydrated. Try to drink more clear fluids, such as water.  Do not drink alcohol.  Limit how much caffeine you drink or eat, if your doctor tells you to do that.  Limit how much salt (sodium) you drink or eat, if your doctor tells you to do that. Activity  Avoid making quick movements. ? When you stand up from sitting in a chair, steady yourself until you feel okay. ? In the morning, first sit up on the side of the bed. When you feel okay, stand slowly while you hold onto something. Do this until you know that your balance is fine.  If you need to stand in one place for a long time, move your legs often. Tighten and relax the muscles in your legs while you are standing.  Do not drive or use heavy machinery if you feel dizzy.  Avoid bending down if you feel dizzy. Place items in your home so you can reach them easily without leaning over. Lifestyle  Do not use any products that contain nicotine or tobacco, such as cigarettes  and e-cigarettes. If you need help quitting, ask your doctor.  Try to lower your stress level. You can do this by using methods such as yoga or meditation. Talk with your doctor if you need help. General instructions  Watch your dizziness for any changes.  Take over-the-counter and prescription medicines only as told by your doctor. Talk with your doctor if you think that you are dizzy because of a medicine that you are taking.  Tell a friend or a family member that you are feeling dizzy. If he or she notices any changes in your behavior, have this person call your doctor.  Keep all follow-up visits as told by your doctor. This is important. Contact a doctor if:  Your dizziness does not go away.  Your dizziness or light-headedness gets worse.  You feel sick to your stomach (nauseous).  You have trouble hearing.  You have new symptoms.  You are unsteady on your feet.  You feel like the room is spinning. Get help right away if:  You throw up (vomit) or have watery poop (diarrhea), and you cannot eat or drink anything.  You have trouble: ? Talking. ? Walking. ? Swallowing. ? Using your arms, hands, or legs.  You feel generally weak.  You are not thinking clearly, or you have trouble forming sentences. A friend or family member may notice this.  You have: ? Chest pain. ? Pain  in your belly (abdomen). ? Shortness of breath. ? Sweating.  Your vision changes.  You are bleeding.  You have a very bad headache.  You have neck pain or a stiff neck.  You have a fever. These symptoms may be an emergency. Do not wait to see if the symptoms will go away. Get medical help right away. Call your local emergency services (911 in the [place] Do not drive yourself to the hospital. Summary  Dizziness makes you feel unsteady or light-headed. You may feel like you are about to pass out (faint).  Drink enough fluid to keep your pee (urine) clear or pale yellow. Do not drink  alcohol.  Avoid making quick movements if you feel dizzy.  Watch your dizziness for any changes. This information is not intended to replace advice given to you by your health care provider. Make sure you discuss any questions you have with your health care provider. Document Released: 03/30/2011 Document Revised: 04/27/2016 Document Reviewed: 04/27/2016 Elsevier Interactive Patient Education  2017 [employer]

## 2017-12-07 NOTE — Progress Notes (Signed)
Subjective: CC: dizzy PCP: Junie Spencer, FNP XWR:[id number] Kathryn Arnold is a 72 y.o. female presenting to clinic today for:  1. Dizzy Patient reports a several day history of dizziness.  She notes that it seems worse over the last couple of days.  She notes this morning she woke up feeling totally fatigued and lightheaded.  Denies any vertigo symptoms.  No vomiting, visual disturbance.  She overall feels weak.  Denies any hematochezia, melena, hematuria.  She thought that symptoms may have been related to low blood sugar, which she has had in the past.  She notes that she drank orange juice but it took several hours before she started feeling any better.  She also notes a history of atrial fibrillation, citing that her heart was in A. fib over the weekend.  She took her medication which seems to have relieved the symptoms.  Denies any palpitations now.  No sinus symptoms.  Denies any ear pain or fullness.   ROS: Per HPI  No Known Allergies Past Medical History:  Diagnosis Date  . Abnormal Pap smear   . Arthritis    "fingers" (10/31/2016)  . Complication of anesthesia    a little slow to wake up-stayed drowsy  . 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 HSV-2 (herpes simplex virus 2) infection   . Hypercholesteremia   . Hyperlipidemia with target LDL less than 100   . Hypertension   . Osteoporosis, unspecified    Last DEXA 10/2011   . Paroxysmal atrial fibrillation (HCC)    s/p ablation Afib and flutter 11/03/14  . PONV (postoperative nausea and vomiting)   . Vitamin D deficiency     Current Outpatient Medications:  .  alendronate (FOSAMAX) 70 MG tablet, Take 1 tablet (70 mg total) by mouth every 7 (seven) days. Take with a full glass of water on an empty stomach., Disp: 4 tablet, Rfl: 11 .  diltiazem (CARDIZEM LA) 120 MG 24 hr tablet, Take 1 tablet (120 mg total) by mouth daily., Disp: 90 tablet, Rfl: 2 .  diltiazem (CARDIZEM) 60 MG tablet, Take 60 mg by mouth every 4 (four) hours  as needed (for afib). Take 1/2 to 1 tablet by mouth every 4 hours AS NEEDED for heart rate >100 as long as blood pressure >100 , Disp: , Rfl:  .  flecainide (TAMBOCOR) 50 MG tablet, TAKE 1 TABLET BY MOUTH TWICE DAILY, Disp: 60 tablet, Rfl: 3 .  fluticasone (FLONASE) 50 MCG/ACT nasal spray, Place 2 sprays into both nostrils daily., Disp: 16 g, Rfl: 6 .  rivaroxaban (XARELTO) 20 MG TABS tablet, Take 1 tablet (20 mg total) by mouth at bedtime., Disp: 90 tablet, Rfl: 1 .  rosuvastatin (CRESTOR) 20 MG tablet, Take 1 tablet (20 mg total) by mouth at bedtime., Disp: 90 tablet, Rfl: 1 .  valACYclovir (VALTREX) 500 MG tablet, TAKE 1 TABLET BY MOUTH ONCE DAILY WITH LUNCH, Disp: 90 tablet, Rfl: 1 Social History   Socioeconomic History  . Marital status: Married    Spouse name: Not on file  . Number of children: Not on file  . Years of education: Not on file  . Highest education level: Not on file  Occupational History  . Not on file  Social Needs  . Financial resource strain: Not on file  . Food insecurity:    Worry: Not on file    Inability: Not on file  . Transportation needs:    Medical: Not on file    Non-medical:  Not on file  Tobacco Use  . Smoking status: Never Smoker  . Smokeless tobacco: Never Used  Substance and Sexual Activity  . Alcohol use: No  . Drug use: No  . Sexual activity: Not Currently    Birth control/protection: None  Lifestyle  . Physical activity:    Days per week: Not on file    Minutes per session: Not on file  . Stress: Not on file  Relationships  . Social connections:    Talks on phone: Not on file    Gets together: Not on file    Attends religious service: Not on file    Active member of club or organization: Not on file    Attends meetings of clubs or organizations: Not on file    Relationship status: Not on file  . Intimate partner violence:    Fear of current or ex partner: Not on file    Emotionally abused: Not on file    Physically abused: Not on  file    Forced sexual activity: Not on file  Other Topics Concern  . Not on file  Social History Narrative   Lives in [place] with spouse.   Family History  Problem Relation Age of Onset  . Osteoporosis Mother   . Cancer Mother        breast  . Hypertension Mother   . Heart disease Father   . Other Neg Hx     Objective: Office vital signs reviewed. BP (!) 156/85 (BP Location: Left Arm, Cuff Size: Normal)   Pulse (!) 59   Temp 98 F (36.7 C) (Oral)   Ht 5\' 5"  (1.651 m)   Wt 156 lb (70.8 kg)   BMI 25.96 kg/m   Physical Examination:  General: Awake, alert, well nourished, nontoxic. No acute distress HEENT: Normal    Neck: No masses palpated. No lymphadenopathy    Ears: Tympanic membranes intact, normal light reflex, no erythema, no bulging    Eyes: PERRLA, extraocular membranes intact, sclera white    Nose: nasal turbinates moist, no nasal discharge    Throat: moist mucus membranes, no erythema, no tonsillar exudate.  Airway is patent Cardio: slightly bradycardic w/ regular rhythm, S1S2 heard, no murmurs appreciated Pulm: clear to auscultation bilaterally, no wheezes, rhonchi or rales; normal work of breathing on room air Extremities: warm, well perfused, No edema, cyanosis or clubbing; +2 pulses bilaterally MSK: normal gait and normal station Neuro: 5/5 UE and LE Strength and light touch sensation grossly intact, cranial nerves II through XII grossly intact.  Orthostatic VS for the past 24 hrs:  BP- Lying Pulse- Lying BP- Sitting Pulse- Sitting BP- Standing at 0 minutes Pulse- Standing at 0 minutes  12/07/17 1404 152/87 58 156/85 59 157/88 63    Results for orders placed or performed in visit on 12/07/17 (from the past 24 hour(s))  Glucose Hemocue Waived     Status: Abnormal   Collection Time: 12/07/17  2:06 PM  Result Value Ref Range   Glu Hemocue Waived 109 (H) 65 - 99 mg/dL   Narrative   Performed at:  [address] - [facility] [place] [address], [place], [place]   [id number] Lab Director: Rockie Neighboursatricia Lawson [facility], Phone:  [phone]   Assessment/ Plan: 72 y.o. female   1. Dizziness Patient is afebrile nontoxic-appearing.  Her blood pressure is slightly elevated.  Pulse slightly low.  EKG considered but patient in normal rhythm.  No evidence of A. fib with RVR.  Her orthostatic vital signs  are unremarkable.  Blood sugar 109.  Dizziness does not seem to be BPPV.  No focal neurologic deficits on exam.  Will check for anemia and check electrolytes.  I have given her supply of meclizine to use as needed.  I did caution sedation.  Push oral fluids.  If symptoms persist or she develops any worsening symptoms or signs, she will seek immediate medical attention.  Otherwise, patient to follow-up with PCP as scheduled for routine care. - Glucose Hemocue Waived - CBC with Differential - Basic Metabolic Panel   Orders Placed This Encounter  Procedures  . Glucose Hemocue Waived  . CBC with Differential  . Basic Metabolic Panel   Meds ordered this encounter  Medications  . meclizine (ANTIVERT) 25 MG tablet    Sig: Take 0.5-1 tablets (12.5-25 mg total) by mouth 3 (three) times daily as needed for dizziness.    Dispense:  30 tablet    Refill:  0     Kathryn Arnold Hulen SkainsM Modesto Ganoe, DO [facility] [phone]

## 2017-12-08 LAB — CBC WITH DIFFERENTIAL/PLATELET
BASOS: 1 %
Basophils Absolute: 0 10*3/uL (ref 0.0–0.2)
EOS (ABSOLUTE): 0.1 10*3/uL (ref 0.0–0.4)
Eos: 2 %
HEMOGLOBIN: 14.2 g/dL (ref 11.1–15.9)
Hematocrit: 41.7 % (ref 34.0–46.6)
IMMATURE GRANS (ABS): 0 10*3/uL (ref 0.0–0.1)
IMMATURE GRANULOCYTES: 0 %
LYMPHS: 37 %
Lymphocytes Absolute: 1.9 10*3/uL (ref 0.7–3.1)
MCH: 30.9 pg (ref 26.6–33.0)
MCHC: 34.1 g/dL (ref 31.5–35.7)
MCV: 91 fL (ref 79–97)
MONOCYTES: 9 %
Monocytes Absolute: 0.5 10*3/uL (ref 0.1–0.9)
NEUTROS ABS: 2.7 10*3/uL (ref 1.4–7.0)
NEUTROS PCT: 51 %
Platelets: 248 10*3/uL (ref 150–450)
RBC: 4.6 x10E6/uL (ref 3.77–5.28)
RDW: 13.1 % (ref 12.3–15.4)
WBC: 5.1 10*3/uL (ref 3.4–10.8)

## 2017-12-08 LAB — BASIC METABOLIC PANEL
BUN / CREAT RATIO: 20 (ref 12–28)
BUN: 17 mg/dL (ref 8–27)
CALCIUM: 10 mg/dL (ref 8.7–10.3)
CHLORIDE: 104 mmol/L (ref 96–106)
CO2: 23 mmol/L (ref 20–29)
CREATININE: 0.83 mg/dL (ref 0.57–1.00)
GFR calc Af Amer: 83 mL/min/{1.73_m2} (ref >59)
GFR calc non Af Amer: 72 mL/min/{1.73_m2} (ref >59)
GLUCOSE: 94 mg/dL (ref 65–99)
Potassium: 4.9 mmol/L (ref 3.5–5.2)
Sodium: 141 mmol/L (ref 134–144)

## 2017-12-15 ENCOUNTER — Other Ambulatory Visit: Payer: Self-pay

## 2017-12-15 ENCOUNTER — Encounter (HOSPITAL_COMMUNITY): Payer: Self-pay | Admitting: *Deleted

## 2017-12-15 ENCOUNTER — Emergency Department (HOSPITAL_COMMUNITY)
Admission: EM | Admit: 2017-12-15 | Discharge: 2017-12-15 | Disposition: A | Discharge status: Home / Self Care | Payer: 59 | Attending: Emergency Medicine | Admitting: Emergency Medicine

## 2017-12-15 DIAGNOSIS — I4891 Unspecified atrial fibrillation: Secondary | ICD-10-CM | POA: Diagnosis not present

## 2017-12-15 DIAGNOSIS — Z79899 Other long term (current) drug therapy: Secondary | ICD-10-CM | POA: Insufficient documentation

## 2017-12-15 DIAGNOSIS — I10 Essential (primary) hypertension: Secondary | ICD-10-CM | POA: Diagnosis not present

## 2017-12-15 DIAGNOSIS — R000 Tachycardia, unspecified: Secondary | ICD-10-CM | POA: Insufficient documentation

## 2017-12-15 LAB — COMPREHENSIVE METABOLIC PANEL
ALBUMIN: 4.1 g/dL (ref 3.5–5.0)
ALK PHOS: 52 U/L (ref 38–126)
ALT: 23 U/L (ref 0–44)
AST: 28 U/L (ref 15–41)
Anion gap: 9 (ref 5–15)
BUN: 17 mg/dL (ref 8–23)
CALCIUM: 8.8 mg/dL — AB (ref 8.9–10.3)
CO2: 23 mmol/L (ref 22–32)
Chloride: 109 mmol/L (ref 98–111)
Creatinine, Ser: 0.61 mg/dL (ref 0.44–1.00)
GFR calc Af Amer: >60 mL/min (ref >60)
GFR calc non Af Amer: >60 mL/min (ref >60)
GLUCOSE: 134 mg/dL — AB (ref 70–99)
Potassium: 3.7 mmol/L (ref 3.5–5.1)
Sodium: 141 mmol/L (ref 135–145)
TOTAL PROTEIN: 6.9 g/dL (ref 6.5–8.1)
Total Bilirubin: 0.5 mg/dL (ref 0.3–1.2)

## 2017-12-15 LAB — CBC WITH DIFFERENTIAL/PLATELET
BASOS PCT: 0 %
Basophils Absolute: 0 10*3/uL (ref 0.0–0.1)
EOS ABS: 0 10*3/uL (ref 0.0–0.7)
Eosinophils Relative: 1 %
HCT: 44.6 % (ref 36.0–46.0)
Hemoglobin: 15.2 g/dL — ABNORMAL HIGH (ref 12.0–15.0)
LYMPHS ABS: 1 10*3/uL (ref 0.7–4.0)
Lymphocytes Relative: 21 %
MCH: 30.6 pg (ref 26.0–34.0)
MCHC: 34.1 g/dL (ref 30.0–36.0)
MCV: 89.7 fL (ref 78.0–100.0)
MONO ABS: 0.3 10*3/uL (ref 0.1–1.0)
MONOS PCT: 7 %
Neutro Abs: 3.2 10*3/uL (ref 1.7–7.7)
Neutrophils Relative %: 71 %
Platelets: 230 10*3/uL (ref 150–400)
RBC: 4.97 MIL/uL (ref 3.87–5.11)
RDW: 12.2 % (ref 11.5–15.5)
WBC: 4.5 10*3/uL (ref 4.0–10.5)

## 2017-12-15 LAB — TROPONIN I: Troponin I: <0.03 ng/mL (ref <0.03)

## 2017-12-15 MED ORDER — METOPROLOL TARTRATE 5 MG/5ML IV SOLN
5.0000 mg | INTRAVENOUS | Status: DC | PRN
  Administered 2017-12-15: 5 mg via INTRAVENOUS
  Filled 2017-12-15: qty 5

## 2017-12-15 NOTE — ED Notes (Signed)
Pt has a Purewick on

## 2017-12-15 NOTE — ED Provider Notes (Signed)
Patient was reexamined multiple times by myself, she is well-appearing without complaint with a heart rate in the 50s which is normal for her.  I have reviewed her labs which showed no acute findings, slight hypocalcemia but of no concern.  At this time the patient is stable for discharge, she can follow-up with her cardiologist as needed, her paroxysmal episode of what is either atrial fibrillation or atrial flutter with 2-1 block is completely resolved.  She has no pulmonary symptoms and no chest pain at this time.  Stable for discharge  The patient expressed understanding to the indications for return.   Eber HongMiller, Annaleia Pence, MD 12/15/17 [id number]

## 2017-12-15 NOTE — Discharge Instructions (Signed)
Your testing today was unremarkable except for a slightly low calcium level.  You would probably benefit from taking some extra calcium supplements Your EKG has normalized, your heart rate is back to normal, please continue to take her daily medications as prescribed, please follow-up with your cardiologist within the next week Emergency department if you have recurrent fast heart rate that does not improve by taking your home medications Please watch for increasing chest pain difficulty breathing, coughing, fevers, seek medical attention immediately for any of these Please limit your intake of stimulants such as over-the-counter cough and cold medications, caffeinated products or energy drinks

## 2017-12-15 NOTE — ED Triage Notes (Addendum)
Pt brought in by [facility] for c/o increased heart rate; pt had a heart rate of 140's; pt has a hx of afib; pt had sinus tach with ems; pt cbg 119; pt states she got up to go to the bathroom and when she went to lay back down she felt her heart racing

## 2017-12-15 NOTE — ED Provider Notes (Signed)
[facility] EMERGENCY DEPARTMENT Provider Note   CSN: [id number] Arrival date & time: 12/15/17  2130  Time seen 06:20 AM   History   Chief Complaint Chief Complaint  Patient presents with  . Tachycardia    HPI Kathryn Arnold is a 72 y.o. female.  HPI   patient reports she has a history of atrial fibrillation that started about 4 years ago.  She states she had a ablation done in July 2018 and was seen again in August for a fast heart rate.  She states she started having symptoms again in April and since then has had 1-2 episodes a month, the last episode was 2 or 3 weeks ago.  She states this morning she woke up at 3 AM to go to the bathroom.  When she came back to bed she felt like her heart was racing.  She checked her heart rate on her pulse oximetry monitor and her heart rate was in the 160s and her pulse ox was 97%.  She took a Cardizem 60 mg tablet at 3 AM and then at 4 AM took a dose of her flecainide 50 mg.  She states she is feeling a little bit better now.  EMS report that the highest heart rate they saw was 149.  Their EKG showed a sinus tach.  Her blood pressure initially was 148/105.  They report her pulse ox was 97 to 98% and when it dropped to 96 they put her on oxygen.  Patient states she has some mild shortness of breath but denies chest pain, diaphoresis, nausea, vomiting, feeling dizzy.  She does report feeling a little bit lightheaded.  She states she called Dr. Jenel Lucks office to let them know she was starting to have more symptoms and they told her to keep her appointment in October and if she had more symptoms to go to the ED.  She takes Xarelto and states she is compliant.  She takes Cardizem LA 120 mg once daily and like a night 50 mg twice a day for rate control.  She denies any precipitating events or change in her activity or diet.  She does however report that when this happens she has frequent bowel movements.  She states she has had 3 normal bowel movements this  morning once the atrial fib starts.  PCP Junie Spencer, FNP Cardiology Dr Johney Frame  Past Medical History:  Diagnosis Date  . Abnormal Pap smear   . Arthritis    "fingers" (10/31/2016)  . Complication of anesthesia    a little slow to wake up-stayed drowsy  . 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 HSV-2 (herpes simplex virus 2) infection   . Hypercholesteremia   . Hyperlipidemia with target LDL less than 100   . Hypertension   . Osteoporosis, unspecified    Last DEXA 10/2011   . Paroxysmal atrial fibrillation (HCC)    s/p ablation Afib and flutter 11/03/14  . PONV (postoperative nausea and vomiting)   . Vitamin D deficiency     Patient Active Problem List   Diagnosis Date Noted  . PONV (postoperative nausea and vomiting)   . Complication of anesthesia   . Arthritis   . Paroxysmal atrial fibrillation (HCC) 10/31/2016  . GAD (generalized anxiety disorder) 01/28/2014  . Essential hypertension, benign 01/20/2014  . Atrial fibrillation (HCC) 12/31/2013  . HSV-2 (herpes simplex virus 2) infection 12/18/2013  . Vitamin D deficiency 12/18/2013  . Hyperlipidemia with target LDL less than 100 08/28/2012  .  Osteoporosis 08/28/2012  . HPV (human papilloma virus) infection 09/23/2011    Past Surgical History:  Procedure Laterality Date  . ATRIAL FIBRILLATION ABLATION N/A 10/31/2016   Procedure: Atrial Fibrillation Ablation;  Surgeon: Hillis RangeAllred, James, MD;  Location: [facility] INVASIVE CV LAB;  Service: Cardiovascular;  Laterality: N/A;  . BREAST LUMPECTOMY Left   . DILATION AND CURETTAGE OF UTERUS    . ELECTROPHYSIOLOGIC STUDY N/A 11/03/2014   Procedure: Atrial Fibrillation Ablation;  Surgeon: Hillis RangeJames Allred, MD;  [facility];  Service: Cardiovascular;  Laterality: N/A;  . LAPAROSCOPY ABDOMEN DIAGNOSTIC  1990s  . TEE WITHOUT CARDIOVERSION N/A 10/30/2016   Procedure: TRANSESOPHAGEAL ECHOCARDIOGRAM (TEE);  Surgeon: Thurmon Fairroitoru, Mihai, MD;  Location: [facility] ENDOSCOPY;  Service:  Cardiovascular;  Laterality: N/A;  . TUBAL LIGATION  1978     OB History    Gravida  2   Para  2   Term  2   Preterm  0   AB  0   Living  2     SAB  0   TAB  0   Ectopic  0   Multiple  0   Live Births               Home Medications    Prior to Admission medications   Medication Sig Start Date End Date Taking? Authorizing Provider  alendronate (FOSAMAX) 70 MG tablet Take 1 tablet (70 mg total) by mouth every 7 (seven) days. Take with a full glass of water on an empty stomach. 12/04/17 12/04/18  Junie SpencerHawks, Christy A, FNP  diltiazem (CARDIZEM LA) 120 MG 24 hr tablet Take 1 tablet (120 mg total) by mouth daily. 11/22/16   Junie SpencerHawks, Christy A, FNP  diltiazem (CARDIZEM) 60 MG tablet Take 60 mg by mouth every 4 (four) hours as needed (for afib). Take 1/2 to 1 tablet by mouth every 4 hours AS NEEDED for heart rate >100 as long as blood pressure >100     [provider]  flecainide (TAMBOCOR) 50 MG tablet TAKE 1 TABLET BY MOUTH TWICE DAILY 09/05/17   Newman Niparroll, Donna C, NP  fluticasone (FLONASE) 50 MCG/ACT nasal spray Place 2 sprays into both nostrils daily. 10/11/17   Junie SpencerHawks, Christy A, FNP  meclizine (ANTIVERT) 25 MG tablet Take 0.5-1 tablets (12.5-25 mg total) by mouth 3 (three) times daily as needed for dizziness. 12/07/17   Raliegh IpGottschalk, Ashly M, DO  rivaroxaban (XARELTO) 20 MG TABS tablet Take 1 tablet (20 mg total) by mouth at bedtime. 11/29/17   Jannifer RodneyHawks, Christy A, FNP  rosuvastatin (CRESTOR) 20 MG tablet Take 1 tablet (20 mg total) by mouth at bedtime. 11/29/17   Junie SpencerHawks, Christy A, FNP  valACYclovir (VALTREX) 500 MG tablet TAKE 1 TABLET BY MOUTH ONCE DAILY WITH LUNCH 11/29/17   Junie SpencerHawks, Christy A, FNP    Family History Family History  Problem Relation Age of Onset  . Osteoporosis Mother   . Cancer Mother        breast  . Hypertension Mother   . Heart disease Father   . Other Neg Hx     Social History Social History   Tobacco Use  . Smoking status: Never Smoker  . Smokeless  tobacco: Never Used  Substance Use Topics  . Alcohol use: No  . Drug use: No  lives at home Lives with spouse   Allergies   Patient has no known allergies.   Review of Systems Review of Systems  All other systems reviewed and are negative.  Physical Exam    ED Triage Vitals  Enc Vitals Group     BP 12/15/17 0630 (!) 130/101     Pulse Rate 12/15/17 0630 (!) 145     Resp 12/15/17 0648 15     Temp --      Temp src --      SpO2 12/15/17 0630 99 %     Weight 12/15/17 0622 156 lb (70.8 kg)     Height 12/15/17 0622 5\' 5"  (1.651 m)     Head Circumference --      Peak Flow --      Pain Score 12/15/17 0622 0     Pain Loc --      Pain Edu? --      Excl. in GC? --     Vital signs normal except for tachycardia and diastolic hypertension   Physical Exam  Constitutional: She is oriented to person, place, and time. She appears well-developed and well-nourished.  Non-toxic appearance. She does not appear ill. No distress.  HENT:  Head: Normocephalic and atraumatic.  Right Ear: External ear normal.  Left Ear: External ear normal.  Nose: Nose normal. No mucosal edema or rhinorrhea.  Mouth/Throat: Oropharynx is clear and moist and mucous membranes are normal. No dental abscesses or uvula swelling.  Eyes: Pupils are equal, round, and reactive to light. Conjunctivae and EOM are normal.  Neck: Normal range of motion and full passive range of motion without pain. Neck supple.  Cardiovascular: Regular rhythm and normal heart sounds. Tachycardia present. Exam reveals no gallop and no friction rub.  No murmur heard. Pulmonary/Chest: Effort normal and breath sounds normal. No respiratory distress. She has no wheezes. She has no rhonchi. She has no rales. She exhibits no tenderness and no crepitus.  Abdominal: Soft. Normal appearance and bowel sounds are normal. She exhibits no distension. There is no tenderness. There is no rebound and no guarding.  Musculoskeletal: Normal range of  motion. She exhibits no edema or tenderness.  Moves all extremities well.   Neurological: She is alert and oriented to person, place, and time. She has normal strength. No cranial nerve deficit.  Skin: Skin is warm, dry and intact. No rash noted. No erythema. No pallor.  Psychiatric: She has a normal mood and affect. Her speech is normal and behavior is normal. Her mood appears not anxious.  Nursing note and vitals reviewed.    ED Treatments / Results  Labs (all labs ordered are listed, but only abnormal results are displayed)  Laboratory results pending    EKG EKG Interpretation  Date/Time:  Saturday December 15 2017 06:32:53 EDT Ventricular Rate:  147 PR Interval:    QRS Duration: 95 QT Interval:  258 QTC Calculation: 404 R Axis:   -12 Text Interpretation:  Sinus or ectopic atrial tachycardia RSR' in V1 or V2, probably normal variant Repolarization abnormality, prob rate related Since last tracing rate faster 29 Nov 2016 Confirmed by Devoria Albe ([id number]) on 12/15/2017 6:37:03 AM   #2 After 1 dose of lopressor 5 mg IV  EKG Interpretation  Date/Time:  Saturday December 15 2017 06:49:22 EDT Ventricular Rate:  44 PR Interval:    QRS Duration: 122 QT Interval:  453 QTC Calculation: 388 R Axis:   -23 Text Interpretation:  Sinus bradycardia IVCD, consider atypical RBBB Since last tracing of earlier today ST less depressed in Inferolateral leads heart rate is much slower Confirmed by Devoria Albe ([id number]) on 12/15/2017 6:56:44 AM  Radiology No results found.  Procedures Procedures (including critical care time)  Medications Ordered in ED Medications  metoprolol tartrate (LOPRESSOR) injection 5 mg (5 mg Intravenous Given 12/15/17 0640)     Initial Impression / Assessment and Plan / ED Course  I have reviewed the triage vital signs and the nursing notes.  Pertinent labs & imaging results that were available during my care of the patient were reviewed by me and  considered in my medical decision making (see chart for details).     Patient has already taken an extra dose of Cardizem and her flecainide this morning.  She was given IV Lopressor to see if that would slow her heart rate down enough to tell if this is a sinus tachycardia or possibly atrial flutter with 2-1 block.  Her heart rate is constant with a rate of 148 on her monitor.  Laboratory testing was done.  Recheck at 6:48 AM when I walk in the room patient's heart rate on the monitor is 45-48.  Her nurse states that when she gave the last milligram of the 5 mg of Lopressor slow IV her heart rate acutely dropped from the 140s into the 40s.  Patient states she is feeling improved.  Her blood pressure is 139/79.  Repeat EKG was done.  Look at her prior vital signs from August 16 her heart rate then was 48-65.  Patient states her heart rate has been in the 50s before.  07:13 AM Patient left with Dr Fleet Contras at change of shift to get results of her lab work and to monitor her HR.   Final Clinical Impressions(s) / ED Diagnoses   Final diagnoses:  Tachycardia    Disposition pending  Devoria Albe, MD, Concha Pyo, MD 12/15/17 [phone]

## 2017-12-18 ENCOUNTER — Encounter: Payer: Self-pay | Admitting: Family

## 2017-12-18 ENCOUNTER — Ambulatory Visit: Payer: 59 | Admitting: Family

## 2017-12-18 VITALS — BP 133/78 | HR 61 | Temp 97.6°F | Ht 65.0 in | Wt 153.8 lb

## 2017-12-18 DIAGNOSIS — I480 Paroxysmal atrial fibrillation: Secondary | ICD-10-CM

## 2017-12-18 DIAGNOSIS — Z09 Encounter for follow-up examination after completed treatment for conditions other than malignant neoplasm: Secondary | ICD-10-CM

## 2017-12-18 NOTE — Patient Instructions (Signed)
Atrial Fibrillation Atrial fibrillation is a type of irregular or rapid heartbeat (arrhythmia). In atrial fibrillation, the heart quivers continuously in a chaotic pattern. This occurs when parts of the heart receive disorganized signals that make the heart unable to pump blood normally. This can increase the risk for stroke, heart failure, and other heart-related conditions. There are different types of atrial fibrillation, including:  Paroxysmal atrial fibrillation. This type starts suddenly, and it usually stops on its own shortly after it starts.  Persistent atrial fibrillation. This type often lasts longer than a week. It may stop on its own or with treatment.  Long-lasting persistent atrial fibrillation. This type lasts longer than 12 months.  Permanent atrial fibrillation. This type does not go away.  Talk with your health care provider to learn about the type of atrial fibrillation that you have. What are the causes? This condition is caused by some heart-related conditions or procedures, including:  A heart attack.  Coronary artery disease.  Heart failure.  Heart valve conditions.  High blood pressure.  Inflammation of the sac that surrounds the heart (pericarditis).  Heart surgery.  Certain heart rhythm disorders, such as Wolf-Parkinson-White syndrome.  Other causes include:  Pneumonia.  Obstructive sleep apnea.  Blockage of an artery in the lungs (pulmonary embolism, or PE).  Lung cancer.  Chronic lung disease.  Thyroid problems, especially if the thyroid is overactive (hyperthyroidism).  Caffeine.  Excessive alcohol use or illegal drug use.  Use of some medicines, including certain decongestants and diet pills.  Sometimes, the cause cannot be found. What increases the risk? This condition is more likely to develop in:  People who are older in age.  People who smoke.  People who have diabetes mellitus.  People who are overweight  (obese).  Athletes who exercise vigorously.  What are the signs or symptoms? Symptoms of this condition include:  A feeling that your heart is beating rapidly or irregularly.  A feeling of discomfort or pain in your chest.  Shortness of breath.  Sudden light-headedness or weakness.  Getting tired easily during exercise.  In some cases, there are no symptoms. How is this diagnosed? Your health care provider may be able to detect atrial fibrillation when taking your pulse. If detected, this condition may be diagnosed with:  An electrocardiogram (ECG).  A Holter monitor test that records your heartbeat patterns over a 24-hour period.  Transthoracic echocardiogram (TTE) to evaluate how blood flows through your heart.  Transesophageal echocardiogram (TEE) to view more detailed images of your heart.  A stress test.  Imaging tests, such as a CT scan or chest X-ray.  Blood tests.  How is this treated? The main goals of treatment are to prevent blood clots from forming and to keep your heart beating at a normal rate and rhythm. The type of treatment that you receive depends on many factors, such as your underlying medical conditions and how you feel when you are experiencing atrial fibrillation. This condition may be treated with:  Medicine to slow down the heart rate, bring the heart's rhythm back to normal, or prevent clots from forming.  Electrical cardioversion. This is a procedure that resets your heart's rhythm by delivering a controlled, low-energy shock to the heart through your skin.  Different types of ablation, such as catheter ablation, catheter ablation with pacemaker, or surgical ablation. These procedures destroy the heart tissues that send abnormal signals. When the pacemaker is used, it is placed under your skin to help your heart beat in   a regular rhythm.  Follow these instructions at home:  Take over-the counter and prescription medicines only as told by your  health care provider.  If your health care provider prescribed a blood-thinning medicine (anticoagulant), take it exactly as told. Taking too much blood-thinning medicine can cause bleeding. If you do not take enough blood-thinning medicine, you will not have the protection that you need against stroke and other problems.  Do not use tobacco products, including cigarettes, chewing tobacco, and e-cigarettes. If you need help quitting, ask your health care provider.  If you have obstructive sleep apnea, manage your condition as told by your health care provider.  Do not drink alcohol.  Do not drink beverages that contain caffeine, such as coffee, soda, and tea.  Maintain a healthy weight. Do not use diet pills unless your health care provider approves. Diet pills may make heart problems worse.  Follow diet instructions as told by your health care provider.  Exercise regularly as told by your health care provider.  Keep all follow-up visits as told by your health care provider. This is important. How is this prevented?  Avoid drinking beverages that contain caffeine or alcohol.  Avoid certain medicines, especially medicines that are used for breathing problems.  Avoid certain herbs and herbal medicines, such as those that contain ephedra or ginseng.  Do not use illegal drugs, such as cocaine and amphetamines.  Do not smoke.  Manage your high blood pressure. Contact a health care provider if:  You notice a change in the rate, rhythm, or strength of your heartbeat.  You are taking an anticoagulant and you notice increased bruising.  You tire more easily when you exercise or exert yourself. Get help right away if:  You have chest pain, abdominal pain, sweating, or weakness.  You feel nauseous.  You notice blood in your vomit, bowel movement, or urine.  You have shortness of breath.  You suddenly have swollen feet and ankles.  You feel dizzy.  You have sudden weakness or  numbness of the face, arm, or leg, especially on one side of the body.  You have trouble speaking, trouble understanding, or both (aphasia).  Your face or your eyelid droops on one side. These symptoms may represent a serious problem that is an emergency. Do not wait to see if the symptoms will go away. Get medical help right away. Call your local emergency services (911 in the [place] Do not drive yourself to the hospital. This information is not intended to replace advice given to you by your health care provider. Make sure you discuss any questions you have with your health care provider. Document Released: 04/10/2005 Document Revised: 08/18/2015 Document Reviewed: 08/05/2014 Elsevier Interactive Patient Education  2018 [employer] Inc[employer]  

## 2017-12-18 NOTE — Progress Notes (Signed)
   Subjective:    Patient ID: Kathryn Arnold, female    DOB: 1946/03/04, 72 y.o.   MRN: [id number]  Chief Complaint  Patient presents with  . hospital ED  follow up    HPI PT went to the ED on 12/15/17 for tachycardia. She states she woke up that morning around 3 am and felt her heart racing. She states she took her prn Cardizem 60 mg and then took her flecainide 50 mg.   She was given IV Lopressor and her heart decreased from 140's  To the 40's. Her lab work was stable.   She states she continues to feel weak. She has follow up appt with her Cardiologists in November. She states she would like to see another Cardiologists to get a second opinion since she is "going into A fib" once a month.    Review of Systems  Cardiovascular: Positive for palpitations.  Neurological: Positive for weakness.  All other systems reviewed and are negative.      Objective:   Physical Exam  Constitutional: She is oriented to person, place, and time. She appears well-developed and well-nourished. No distress.  HENT:  Head: Normocephalic and atraumatic.  Right Ear: External ear normal.  Left Ear: External ear normal.  Mouth/Throat: Oropharynx is clear and moist.  Eyes: Pupils are equal, round, and reactive to light.  Neck: Normal range of motion. Neck supple. No thyromegaly present.  Cardiovascular: Normal rate, normal heart sounds and intact distal pulses. An irregular rhythm present.  No murmur heard. Pulmonary/Chest: Effort normal and breath sounds normal. No respiratory distress. She has no wheezes.  Abdominal: Soft. Bowel sounds are normal. She exhibits no distension. There is no tenderness.  Musculoskeletal: Normal range of motion. She exhibits no edema or tenderness.  Neurological: She is alert and oriented to person, place, and time. She has normal reflexes. No cranial nerve deficit.  Skin: Skin is warm and dry.  Psychiatric: She has a normal mood and affect. Her behavior is normal. Judgment  and thought content normal.  Vitals reviewed.    BP 133/78   Pulse 61   Temp 97.6 F (36.4 C) (Oral)   Ht '5\' 5"'$  (1.651 m)   Wt 153 lb 12.8 oz (69.8 kg)   BMI 25.59 kg/m      Assessment & Plan:  Kathryn Arnold comes in today with chief complaint of hospital ED  follow up   Diagnosis and orders addressed:  1. Paroxysmal atrial fibrillation (HCC) Continue Medications and keep Cardiologists follow up Referral pending - CMP14+EGFR - CBC with Differential/Platelet  2. Hospital discharge follow-up - CMP14+EGFR - CBC with Differential/Platelet   Evelina Dun, FNP

## 2017-12-19 LAB — SPECIMEN STATUS REPORT

## 2017-12-20 LAB — CMP14+EGFR
ALK PHOS: 58 IU/L (ref 39–117)
ALT: 17 IU/L (ref 0–32)
AST: 20 IU/L (ref 0–40)
Albumin/Globulin Ratio: 1.8 (ref 1.2–2.2)
Albumin: 4.5 g/dL (ref 3.5–4.8)
BILIRUBIN TOTAL: 0.4 mg/dL (ref 0.0–1.2)
BUN/Creatinine Ratio: 38 — ABNORMAL HIGH (ref 12–28)
BUN: 18 mg/dL (ref 8–27)
CHLORIDE: 101 mmol/L (ref 96–106)
CO2: 20 mmol/L (ref 20–29)
Calcium: 9.3 mg/dL (ref 8.7–10.3)
Creatinine, Ser: 0.48 mg/dL — ABNORMAL LOW (ref 0.57–1.00)
GFR calc Af Amer: 115 mL/min/{1.73_m2} (ref >59)
GFR calc non Af Amer: 100 mL/min/{1.73_m2} (ref >59)
GLUCOSE: 101 mg/dL — AB (ref 65–99)
Globulin, Total: 2.5 g/dL (ref 1.5–4.5)
Potassium: 4.1 mmol/L (ref 3.5–5.2)
Sodium: 139 mmol/L (ref 134–144)
Total Protein: 7 g/dL (ref 6.0–8.5)

## 2017-12-20 LAB — CBC WITH DIFFERENTIAL/PLATELET
BASOS ABS: 0 10*3/uL (ref 0.0–0.2)
Basos: 1 %
EOS (ABSOLUTE): 0.1 10*3/uL (ref 0.0–0.4)
Eos: 1 %
HEMATOCRIT: 42.7 % (ref 34.0–46.6)
Hemoglobin: 14.3 g/dL (ref 11.1–15.9)
IMMATURE GRANS (ABS): 0 10*3/uL (ref 0.0–0.1)
IMMATURE GRANULOCYTES: 0 %
LYMPHS: 32 %
Lymphocytes Absolute: 1.6 10*3/uL (ref 0.7–3.1)
MCH: 29.7 pg (ref 26.6–33.0)
MCHC: 33.5 g/dL (ref 31.5–35.7)
MCV: 89 fL (ref 79–97)
MONOCYTES: 8 %
Monocytes Absolute: 0.4 10*3/uL (ref 0.1–0.9)
NEUTROS PCT: 58 %
Neutrophils Absolute: 2.8 10*3/uL (ref 1.4–7.0)
Platelets: 239 10*3/uL (ref 150–450)
RBC: 4.82 x10E6/uL (ref 3.77–5.28)
RDW: 12.4 % (ref 12.3–15.4)
WBC: 4.8 10*3/uL (ref 3.4–10.8)

## 2018-01-04 ENCOUNTER — Other Ambulatory Visit (HOSPITAL_COMMUNITY): Payer: Self-pay | Admitting: Nurse Practitioner

## 2018-01-12 ENCOUNTER — Ambulatory Visit: Payer: 59 | Admitting: Pediatrics

## 2018-01-12 ENCOUNTER — Encounter: Payer: Self-pay | Admitting: Pediatrics

## 2018-01-12 VITALS — BP 106/61 | HR 68 | Temp 97.3°F | Wt 152.8 lb

## 2018-01-12 DIAGNOSIS — J0100 Acute maxillary sinusitis, unspecified: Secondary | ICD-10-CM | POA: Diagnosis not present

## 2018-01-12 MED ORDER — AMOXICILLIN-POT CLAVULANATE 875-125 MG PO TABS: 1.0000 | ORAL_TABLET | Freq: Two times a day (BID) | ORAL | 0 refills | Status: DC

## 2018-01-12 NOTE — Progress Notes (Signed)
  Subjective:   Patient ID: Kathryn Arnold, female    DOB: 04/24/1946, 72 y.o.   MRN: [id number] CC: Sinusitis (sinus pressure, bloody drainage x 2 wks)  HPI: Kathryn BillsShirley L Newkirk is a 72 y.o. female   Bloody discharge from nose x 2 days.  Has had URI symptoms for the last 2 weeks.  Symptoms worsened over the last few days.  Some pressure in her sinuses.  Worsening congestion and discharge, greenish-yellow and blood-tinged off and on.  She does not think she is coughing up blood lungs.  No fevers.  Relevant past medical, surgical, family and social history reviewed. Allergies and medications reviewed and updated. Social History   Tobacco Use  Smoking Status Never Smoker  Smokeless Tobacco Never Used   ROS: Per HPI   Objective:    BP 106/61   Pulse 68   Temp (!) 97.3 F (36.3 C) (Oral)   Wt 152 lb 12.8 oz (69.3 kg)   BMI 25.43 kg/m   Wt Readings from Last 3 Encounters:  01/12/18 152 lb 12.8 oz (69.3 kg)  12/18/17 153 lb 12.8 oz (69.8 kg)  12/15/17 156 lb (70.8 kg)    Gen: NAD, alert, cooperative with exam, NCAT EYES: EOMI, no conjunctival injection, or no icterus ENT:  TMs dull gray with layering yellow effusions bilaterally, OP without erythema LYMPH: no cervical LAD CV: NRRR, normal S1/S2 Resp: CTABL, no wheezes, normal WOB Ext: No edema, warm Neuro: Alert and oriented, strength equal b/l UE and LE, coordination grossly normal MSK: normal muscle bulk  Assessment & Plan:  Talbert ForestShirley was seen today for sinusitis.  Diagnoses and all orders for this visit:  Acute non-recurrent maxillary sinusitis Symptom care and return precautions discussed. -     amoxicillin-clavulanate (AUGMENTIN) 875-125 MG tablet; Take 1 tablet by mouth 2 (two) times daily.   Follow up plan: Return if symptoms worsen or fail to improve. Rex Krasarol Vincent, MD [facility]

## 2018-01-18 ENCOUNTER — Ambulatory Visit (INDEPENDENT_AMBULATORY_CARE_PROVIDER_SITE_OTHER): Payer: 59

## 2018-01-18 DIAGNOSIS — Z23 Encounter for immunization: Secondary | ICD-10-CM | POA: Diagnosis not present

## 2018-01-24 ENCOUNTER — Telehealth: Payer: Self-pay | Admitting: Internal Medicine

## 2018-01-24 NOTE — Telephone Encounter (Signed)
New Message           Patient is calling today to get a 30 day heart monitor. Need order. Once order is in please route back to me and I will reach out to patient for scheduling. Thank you.

## 2018-01-24 NOTE — Telephone Encounter (Signed)
Returned call to Pt.  Pt states at last visit Dr. Johney Frame had discussed her wearing a 30 day event monitor.  This is not specifically mentioned in last OV note.  Will discuss with Dr. Johney Frame.

## 2018-01-29 ENCOUNTER — Ambulatory Visit: Payer: 59 | Admitting: Family

## 2018-01-29 ENCOUNTER — Encounter: Payer: Self-pay | Admitting: Family

## 2018-01-29 VITALS — BP 146/85 | HR 49 | Temp 97.0°F | Ht 65.0 in | Wt 151.8 lb

## 2018-01-29 DIAGNOSIS — K5900 Constipation, unspecified: Secondary | ICD-10-CM

## 2018-01-29 DIAGNOSIS — I480 Paroxysmal atrial fibrillation: Secondary | ICD-10-CM | POA: Diagnosis not present

## 2018-01-29 MED ORDER — POLYETHYLENE GLYCOL 3350 17 GM/SCOOP PO POWD: 17.0000 g | Freq: Two times a day (BID) | ORAL | 1 refills | Status: DC | PRN

## 2018-01-29 NOTE — Progress Notes (Signed)
   Subjective:    Patient ID: Kathryn Arnold, female    DOB: 07-22-1945, 72 y.o.   MRN: [id number]  Chief Complaint  Patient presents with  . Discuss Afib   PT presents to the office today with complaints constipation. She states she is having a BM every morning, but in the evening she is having hard stool and having to self disimpact herself. She is worried this is causing her increase episodes of A Fib. She has follow up appt with Cardiologists next month.  Constipation  This is a new problem. The current episode started more than 1 year ago. The problem has been waxing and waning since onset. Her stool frequency is 1 time per day. The patient is on a high fiber diet. She exercises regularly. There has been adequate water intake. Pertinent negatives include no back pain, bloating, fecal incontinence or nausea. She has tried diet changes for the symptoms. The treatment provided mild relief.      Review of Systems  Gastrointestinal: Positive for constipation. Negative for bloating and nausea.  Musculoskeletal: Negative for back pain.  All other systems reviewed and are negative.      Objective:   Physical Exam  Constitutional: She is oriented to person, place, and time. She appears well-developed and well-nourished. No distress.  HENT:  Head: Normocephalic and atraumatic.  Right Ear: External ear normal.  Left Ear: External ear normal.  Mouth/Throat: Oropharynx is clear and moist.  Eyes: Pupils are equal, round, and reactive to light.  Neck: Normal range of motion. Neck supple. No thyromegaly present.  Cardiovascular: Regular rhythm, normal heart sounds and intact distal pulses. Bradycardia present.  No murmur heard. Pulmonary/Chest: Effort normal and breath sounds normal. No respiratory distress. She has no wheezes.  Abdominal: Soft. Bowel sounds are normal. She exhibits no distension. There is no tenderness.  Musculoskeletal: Normal range of motion. She exhibits no edema or  tenderness.  Neurological: She is alert and oriented to person, place, and time. She has normal reflexes. No cranial nerve deficit.  Skin: Skin is warm and dry.  Psychiatric: She has a normal mood and affect. Her behavior is normal. Judgment and thought content normal.  Vitals reviewed.     BP (!) 146/85   Pulse (!) 49   Temp (!) 97 F (36.1 C) (Oral)   Ht 5\' 5"  (1.651 m)   Wt 151 lb 12.8 oz (68.9 kg)   BMI 25.26 kg/m      Assessment & Plan:  MAYRA JOLLIFFE comes in today with chief complaint of Discuss Afib   Diagnosis and orders addressed:  1. Constipation, unspecified constipation type Will start Miralax daily or as needed Continue Colace  Continue healthy diet and high fiber Exercise  - polyethylene glycol powder (GLYCOLAX/MIRALAX) powder; Take 17 g by mouth 2 (two) times daily as needed.  Dispense: 3350 g; Refill: 1  2. Paroxysmal atrial fibrillation [facility]) Keep Cardiologists     [name], FNP

## 2018-01-29 NOTE — Patient Instructions (Signed)

## 2018-01-30 ENCOUNTER — Telehealth: Payer: Self-pay | Admitting: Internal Medicine

## 2018-01-30 NOTE — Telephone Encounter (Signed)
Returned call to Pt.  Pt asking about procedure.  Advised Pt is not put to sleep during loop implant.  Discussed procedure with Pt.  Pt asking about insurance reimbursement. Gave Pt codes for loop implant and monthly monitoring.  Advised to make sure what Pt copay for monthly monitoring will be.  Pt states she will do some research and call this nurse back.  Advised would give her dates upon her return call.

## 2018-01-30 NOTE — Telephone Encounter (Signed)
Follow up   Pt wants to know if she will be put to sleep when getting the loop recorder and states she has other questions. Please call

## 2018-01-30 NOTE — Telephone Encounter (Signed)
Returned call to Pt.  Notified Pt per Dr. Johney Frame he had recommended Pt have a loop recorder placed, not an event monitor.  Pt states she will discuss with him in November at upcoming appt.  No further action needed at this time.

## 2018-02-04 ENCOUNTER — Other Ambulatory Visit: Payer: Self-pay | Admitting: Family

## 2018-02-04 DIAGNOSIS — I480 Paroxysmal atrial fibrillation: Secondary | ICD-10-CM

## 2018-02-04 DIAGNOSIS — I10 Essential (primary) hypertension: Secondary | ICD-10-CM

## 2018-03-06 ENCOUNTER — Ambulatory Visit: Payer: 59 | Admitting: Internal Medicine

## 2018-03-06 ENCOUNTER — Encounter: Payer: Self-pay | Admitting: Internal Medicine

## 2018-03-06 VITALS — BP 140/78 | HR 96 | Ht 65.0 in | Wt 150.8 lb

## 2018-03-06 DIAGNOSIS — I4819 Other persistent atrial fibrillation: Secondary | ICD-10-CM | POA: Diagnosis not present

## 2018-03-06 DIAGNOSIS — I480 Paroxysmal atrial fibrillation: Secondary | ICD-10-CM

## 2018-03-06 NOTE — Patient Instructions (Addendum)
Medication Instructions:  Your physician recommends that you continue on your current medications as directed. Please refer to the Current Medication list given to you today.  Labwork: None ordered.  Testing/Procedures: None ordered.  Follow-Up:  Your physician wants you to follow-up in:  7-10 days after your loop implant for a wound check with the device clinic.  Your physician wants you to follow-up in: 3 months with Rudi Coco NP at the afib clinic.   Any Other Special Instructions Will Be Listed Below (If Applicable).  If you need a refill on your cardiac medications before your next appointment, please call your pharmacy.     Implantable Loop Recorder Placement An implantable loop recorder is a small electronic device that is placed under the skin of your chest. It is about the size of an AA ("double A") battery. The device records the electrical activity of your heart over a long period of time. Your health care provider can download these recordings to monitor your heart. You may need an implantable loop recorder if you have periods of abnormal heart activity (arrhythmias) or unexplained fainting (syncope) caused by a heart problem. Tell a health care provider about:  Any allergies you have.  All medicines you are taking, including vitamins, herbs, eye drops, creams, and over-the-counter medicines.  Any problems you or family members have had with anesthetic medicines.  Any blood disorders you have.  Any surgeries you have had.  Any medical conditions you have.  Whether you are pregnant or may be pregnant. What are the risks? Generally, this is a safe procedure. However, as with any procedure, problems may occur, including:  Infection.  Bleeding.  Allergic reactions to anesthetic medicines.  Damage to nerves or blood vessels.  Failure of the device to work. This could require another surgery to replace it.  What happens before the procedure?   You may  have a physical exam, blood tests, and imaging tests of your heart, such as a chest X-ray.  Follow instructions from your health care provider about eating or drinking restrictions.  Ask your health care provider about: ? Changing or stopping your regular medicines. This is especially important if you are taking diabetes medicines or blood thinners. ? Taking medicines such as aspirin and ibuprofen. These medicines can thin your blood. Do not take these medicines before your procedure if your surgeon instructs you not to.  Ask your health care provider how your surgical site will be marked or identified.  You may be given antibiotic medicine to help prevent infection.  Plan to have someone take you home after the procedure.  If you will be going home right after the procedure, plan to have someone with you for 24 hours.  Do not use any tobacco products, such as cigarettes, chewing tobacco, and e-cigarettes as told by your surgeon. If you need help quitting, ask your health care provider. What happens during the procedure?  To reduce your risk of infection: ? Your health care team will wash or sanitize their hands. ? Your skin will be washed with soap.  An IV tube will be inserted into one of your veins.  You may be given an antibiotic medicine through the IV tube.  You may be given one or more of the following: ? A medicine to help you relax (sedative). ? A medicine to numb the area (local anesthetic).  A small cut (incision) will be made on the left side of your upper chest.  A pocket will be created  under your skin.  The device will be placed in the pocket.  The incision will be closed with stitches (sutures) or adhesive strips.  A bandage (dressing) will be placed over the incision. The procedure may vary among health care providers and hospitals. What happens after the procedure?  Your blood pressure, heart rate, breathing rate, and blood oxygen level will be monitored  often until the medicines you were given have worn off.  You may be able to go home on the day of your surgery. Before going home: ? Your health care provider will program your recorder. ? You will learn how to trigger your device with a handheld activator. ? You will learn how to send recordings to your health care provider. ? You will get an ID card for your device, and you will be told when to use it.  Do not drive for 24 hours if you received a sedative. This information is not intended to replace advice given to you by your health care provider. Make sure you discuss any questions you have with your health care provider. Document Released: 03/22/2015 Document Revised: 09/16/2015 Document Reviewed: 01/13/2015 Elsevier Interactive Patient Education  [employer]2018 [employer]

## 2018-03-06 NOTE — Progress Notes (Signed)
PCP: Junie Spencer, FNP   Primary EP: Dr Johney Frame  Kathryn Arnold is a 72 y.o. female who presents today for routine electrophysiology followup.  Since last being seen in our clinic, the patient reports doing very well. + occasional palpitations  Today, she denies symptoms of palpitations, chest pain, shortness of breath,  lower extremity edema, dizziness, presyncope, or syncope.  The patient is otherwise without complaint today.   Past Medical History:  Diagnosis Date  . Abnormal Pap smear   . Arthritis    "fingers" (10/31/2016)  . Complication of anesthesia    a little slow to wake up-stayed drowsy  . 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 HSV-2 (herpes simplex virus 2) infection   . Hypercholesteremia   . Hyperlipidemia with target LDL less than 100   . Hypertension   . Osteoporosis, unspecified    Last DEXA 10/2011   . Paroxysmal atrial fibrillation (HCC)    s/p ablation Afib and flutter 11/03/14  . PONV (postoperative nausea and vomiting)   . Vitamin D deficiency    Past Surgical History:  Procedure Laterality Date  . ATRIAL FIBRILLATION ABLATION N/A 10/31/2016   Procedure: Atrial Fibrillation Ablation;  Surgeon: Hillis Range, MD;  Location: [facility] INVASIVE CV LAB;  Service: Cardiovascular;  Laterality: N/A;  . BREAST LUMPECTOMY Left   . DILATION AND CURETTAGE OF UTERUS    . ELECTROPHYSIOLOGIC STUDY N/A 11/03/2014   Procedure: Atrial Fibrillation Ablation;  Surgeon: Hillis Range, MD;  Location[facility] INVASIVE CV LAB;  Service: Cardiovascular;  Laterality: N/A;  . LAPAROSCOPY ABDOMEN DIAGNOSTIC  1990s  . TEE WITHOUT CARDIOVERSION N/A 10/30/2016   Procedure: TRANSESOPHAGEAL ECHOCARDIOGRAM (TEE);  Surgeon: Thurmon Fair, MD;  Location: [facility] ENDOSCOPY;  Service: Cardiovascular;  Laterality: N/A;  . TUBAL LIGATION  1978    ROS- all systems are reviewed and negatives except as per HPI above  Current Outpatient Medications  Medication Sig Dispense Refill  . alendronate  (FOSAMAX) 70 MG tablet Take 1 tablet (70 mg total) by mouth every 7 (seven) days. Take with a full glass of water on an empty stomach. 4 tablet 11  . amoxicillin-clavulanate (AUGMENTIN) 875-125 MG tablet Take 1 tablet by mouth 2 (two) times daily. 20 tablet 0  . CARDIZEM LA 120 MG 24 hr tablet TAKE 1 TABLET BY MOUTH ONCE DAILY 90 tablet 1  . diltiazem (CARDIZEM) 60 MG tablet Take 60 mg by mouth every 4 (four) hours as needed (for afib). Take 1/2 to 1 tablet by mouth every 4 hours AS NEEDED for heart rate >100 as long as blood pressure >100     . flecainide (TAMBOCOR) 50 MG tablet TAKE 1 TABLET BY MOUTH TWICE DAILY 60 tablet 3  . fluticasone (FLONASE) 50 MCG/ACT nasal spray Place 2 sprays into both nostrils daily. 16 g 6  . meclizine (ANTIVERT) 25 MG tablet Take 0.5-1 tablets (12.5-25 mg total) by mouth 3 (three) times daily as needed for dizziness. 30 tablet 0  . polyethylene glycol powder (GLYCOLAX/MIRALAX) powder Take 17 g by mouth 2 (two) times daily as needed. 3350 g 1  . rivaroxaban (XARELTO) 20 MG TABS tablet Take 1 tablet (20 mg total) by mouth at bedtime. 90 tablet 1  . rosuvastatin (CRESTOR) 20 MG tablet Take 1 tablet (20 mg total) by mouth at bedtime. 90 tablet 1  . valACYclovir (VALTREX) 500 MG tablet TAKE 1 TABLET BY MOUTH ONCE DAILY WITH LUNCH 90 tablet 1   No current facility-administered medications for this  visit.     Physical Exam: Vitals:   03/06/18 1029  BP: 140/78  Pulse: 96  SpO2: 96%  Weight: 150 lb 12.8 oz (68.4 kg)  Height: 5\' 5"  (1.651 m)    GEN- The patient is well appearing, alert and oriented x 3 today.   Head- normocephalic, atraumatic Eyes-  Sclera clear, conjunctiva pink Ears- hearing intact Oropharynx- clear Lungs- Clear to ausculation bilaterally, normal work of breathing Heart- Regular rate and rhythm, no murmurs, rubs or gallops, PMI not laterally displaced GI- soft, NT, ND, + BS Extremities- no clubbing, cyanosis, or edema  Wt Readings from Last  3 Encounters:  03/06/18 150 lb 12.8 oz (68.4 kg)  01/29/18 151 lb 12.8 oz (68.9 kg)  01/12/18 152 lb 12.8 oz (69.3 kg)   EKG tracing ordered today is personally reviewed and shows sinus rhythm 49 bpm, PR 178 msec, QTc 114 msec, QTc 428 msec, incomplete RBBB  Assessment and Plan:  1. Paroxysmal atrial fibrillation Doing reasonably well She still has palpitations of unclear etiology I do think that long term monitoring of her AF post ablation would be beneficial. We have discussed risks and benefits to ILR at length today and she wishes to proceed.  We will schedule ILR implant later this week  chads2vasc score is 2.  Continue xarelto  Return in 10 days for wound check Return to AF clinic in 3 months  Hillis RangeJames Kaiyden Simkin MD, [facility] 03/06/2018 11:05 AM

## 2018-03-08 ENCOUNTER — Encounter: Payer: Self-pay | Admitting: Internal Medicine

## 2018-03-08 ENCOUNTER — Ambulatory Visit: Payer: 59 | Admitting: Internal Medicine

## 2018-03-08 VITALS — BP 140/74 | HR 66 | Ht 65.0 in | Wt 152.4 lb

## 2018-03-08 DIAGNOSIS — R002 Palpitations: Secondary | ICD-10-CM

## 2018-03-08 DIAGNOSIS — I480 Paroxysmal atrial fibrillation: Secondary | ICD-10-CM | POA: Diagnosis not present

## 2018-03-08 HISTORY — PX: OTHER SURGICAL HISTORY: SHX169

## 2018-03-08 NOTE — Patient Instructions (Signed)
Medication Instructions:  Your physician recommends that you continue on your current medications as directed. Please refer to the Current Medication list given to you today.  Labwork: None ordered.  Testing/Procedures: None ordered.  Follow-Up:  [facility] March 18, 2018 at 2:30 pm for a wound check.  You follow up with Rudi Cocoonna Carroll NP at the afib clinic on June 06, 2018 at 11:00 am.   Any Other Special Instructions Will Be Listed Below (If Applicable).  If you need a refill on your cardiac medications before your next appointment, please call your pharmacy.    Implantable Loop Recorder Placement, Care After Refer to this sheet in the next few weeks. These instructions provide you with information about caring for yourself after your procedure. Your health care provider may also give you more specific instructions. Your treatment has been planned according to current medical practices, but problems sometimes occur. Call your health care provider if you have any problems or questions after your procedure. What can I expect after the procedure? After the procedure, it is common to have:  Soreness or pain near the cut from surgery (incision).  Some swelling or bruising near the incision.  Follow these instructions at home: Medicines  Take over-the-counter and prescription medicines only as told by your health care provider.  If you were prescribed an antibiotic medicine, take it as told by your health care provider. Do not stop taking the antibiotic even if you start to feel better. Bathing  Do not take baths, swim, or use a hot tub until your health care provider approves. Ask your health care provider if you can take showers. You may only be allowed to take sponge baths for bathing. Incision care  Follow instructions from your health care provider about how to take care of your incision. Make sure you: ? Wash your  hands with soap and water before you change your bandage (dressing). If soap and water are not available, use hand sanitizer. ? Change your dressing as told by your health care provider. ? Keep your dressing dry. ? Leave stitches (sutures), skin glue, or adhesive strips in place. These skin closures may need to stay in place for 2 weeks or longer. If adhesive strip edges start to loosen and curl up, you may trim the loose edges. Do not remove adhesive strips completely unless your health care provider tells you to do that.  Check your incision area every day for signs of infection. Check for: ? More redness, swelling, or pain. ? Fluid or blood. ? Warmth. ? Pus or a bad smell. Activity  Return to your normal activities as told by your health care provider. Ask your health care provider what activities are safe for you.  General instructions   Follow instructions from your health care provider about how and when to use your implantable loop recorder.  Do not go through a metal detection gate, and do not let someone hold a metal detector over your chest. Show your ID card.  Do not have an MRI unless you check with your health care provider first.   Contact a health care provider if:  You have more redness, swelling, or pain around your incision.  You have more fluid or blood coming from your incision.  Your incision feels warm to the touch.  You have pus or a bad smell coming from your incision.  You have a fever.  You have pain that is  not relieved by your pain medicine.  You have triggered your device because of fainting (syncope) or because of a heartbeat that feels like it is racing, slow, fluttering, or skipping (palpitations). Get help right away if:  You have chest pain.  You have difficulty breathing. This information is not intended to replace advice given to you by your health care provider. Make sure you discuss any questions you have with your health care  provider. Document Released: 03/22/2015 Document Revised: 09/16/2015 Document Reviewed: 01/13/2015 Elsevier Interactive Patient Education  [employer].

## 2018-03-08 NOTE — Progress Notes (Signed)
SURGEON:  Kathryn RangeJames Aadith Raudenbush, MD     PREPROCEDURE DIAGNOSIS:  Atrial fibrillation, palpitations    POSTPROCEDURE DIAGNOSIS:  Atrial fibrillation, palpitations     PROCEDURES:   1. Implantable loop recorder implantation    INTRODUCTION:  Kathryn Arnold is a 72 y.o. female with a history of palpitations and atrial fibrillation who presents today for implantable loop implantation.  The patient has had difficult to manage atrial fibrillation with frequent palpitations.  The patient is also s/p prior atrial fibrillation ablation.   she has worn telemetry previously during which she did not have arrhythmias.  There is significant concern for possible atrial fibrillation as the cause for the palpitations.  In addition long term afib management is felt to require additional monitoring.  The patient therefore presents today for implantable loop implantation.     DESCRIPTION OF PROCEDURE:  Informed written consent was obtained.  The patient required no sedation for the procedure today.  Mapping over the patient's chest was performed to identify the area where electrograms were most prominent for ILR recording.  This area was found to be the left parasternal region over the 3rd-4th intercostal space. The patients left chest was therefore prepped and draped in the usual sterile fashion. The skin overlying the left parasternal region was infiltrated with lidocaine for local analgesia.  A 0.5-cm incision was made over the left parasternal region over the 3rd intercostal space.  A subcutaneous ILR pocket was fashioned using a combination of sharp and blunt dissection.  A Medtronic Reveal Linq model [id number] implantable loop recorder was then placed into the pocket  R waves were very prominent and measured >0.772mV.  Steri- Strips and a sterile dressing were then applied.  There were no early apparent complications.     CONCLUSIONS:   1. Successful implantation of a Medtronic Reveal LINQ implantable loop recorder for  palpitations and recurrent symptoms of atrial fibrillation  2. No early apparent complications.   Kathryn RangeJames Matther Labell MD, [facility] 03/08/2018 8:29 AM

## 2018-03-13 ENCOUNTER — Encounter (HOSPITAL_COMMUNITY): Payer: Self-pay | Admitting: *Deleted

## 2018-03-13 ENCOUNTER — Other Ambulatory Visit: Payer: Self-pay

## 2018-03-13 ENCOUNTER — Observation Stay (HOSPITAL_COMMUNITY)
Admission: EM | Admit: 2018-03-13 | Discharge: 2018-03-14 | Disposition: A | Discharge status: Home / Self Care | Payer: 59 | Attending: Internal Medicine | Admitting: Internal Medicine

## 2018-03-13 ENCOUNTER — Emergency Department (HOSPITAL_COMMUNITY): Payer: 59

## 2018-03-13 DIAGNOSIS — I10 Essential (primary) hypertension: Secondary | ICD-10-CM | POA: Diagnosis not present

## 2018-03-13 DIAGNOSIS — R42 Dizziness and giddiness: Secondary | ICD-10-CM | POA: Insufficient documentation

## 2018-03-13 DIAGNOSIS — Z79899 Other long term (current) drug therapy: Secondary | ICD-10-CM | POA: Diagnosis not present

## 2018-03-13 DIAGNOSIS — I480 Paroxysmal atrial fibrillation: Secondary | ICD-10-CM | POA: Diagnosis not present

## 2018-03-13 DIAGNOSIS — R000 Tachycardia, unspecified: Secondary | ICD-10-CM | POA: Insufficient documentation

## 2018-03-13 DIAGNOSIS — B009 Herpesviral infection, unspecified: Secondary | ICD-10-CM | POA: Diagnosis present

## 2018-03-13 DIAGNOSIS — R400 Somnolence: Secondary | ICD-10-CM | POA: Insufficient documentation

## 2018-03-13 DIAGNOSIS — M1990 Unspecified osteoarthritis, unspecified site: Secondary | ICD-10-CM | POA: Diagnosis not present

## 2018-03-13 DIAGNOSIS — E559 Vitamin D deficiency, unspecified: Secondary | ICD-10-CM | POA: Diagnosis not present

## 2018-03-13 DIAGNOSIS — E7800 Pure hypercholesterolemia, unspecified: Secondary | ICD-10-CM | POA: Diagnosis not present

## 2018-03-13 DIAGNOSIS — Z7901 Long term (current) use of anticoagulants: Secondary | ICD-10-CM | POA: Insufficient documentation

## 2018-03-13 DIAGNOSIS — I4891 Unspecified atrial fibrillation: Secondary | ICD-10-CM | POA: Diagnosis present

## 2018-03-13 DIAGNOSIS — E785 Hyperlipidemia, unspecified: Secondary | ICD-10-CM | POA: Diagnosis present

## 2018-03-13 LAB — CBC
HEMATOCRIT: 45.9 % (ref 36.0–46.0)
Hemoglobin: 15 g/dL (ref 12.0–15.0)
MCH: 29.3 pg (ref 26.0–34.0)
MCHC: 32.7 g/dL (ref 30.0–36.0)
MCV: 89.6 fL (ref 80.0–100.0)
Platelets: 245 10*3/uL (ref 150–400)
RBC: 5.12 MIL/uL — ABNORMAL HIGH (ref 3.87–5.11)
RDW: 11.9 % (ref 11.5–15.5)
WBC: 5.9 10*3/uL (ref 4.0–10.5)
nRBC: 0 % (ref 0.0–0.2)

## 2018-03-13 LAB — BASIC METABOLIC PANEL
Anion gap: 10 (ref 5–15)
BUN: 20 mg/dL (ref 8–23)
CALCIUM: 9.3 mg/dL (ref 8.9–10.3)
CHLORIDE: 107 mmol/L (ref 98–111)
CO2: 22 mmol/L (ref 22–32)
CREATININE: 0.64 mg/dL (ref 0.44–1.00)
GFR calc Af Amer: >60 mL/min (ref >60)
Glucose, Bld: 121 mg/dL — ABNORMAL HIGH (ref 70–99)
Potassium: 3.5 mmol/L (ref 3.5–5.1)
SODIUM: 139 mmol/L (ref 135–145)

## 2018-03-13 LAB — MAGNESIUM: MAGNESIUM: 2 mg/dL (ref 1.7–2.4)

## 2018-03-13 LAB — TROPONIN I: Troponin I: <0.03 ng/mL (ref <0.03)

## 2018-03-13 LAB — PHOSPHORUS: Phosphorus: 2.8 mg/dL (ref 2.5–4.6)

## 2018-03-13 LAB — TSH: TSH: 3.25 u[IU]/mL (ref 0.350–4.500)

## 2018-03-13 IMAGING — DX DG CHEST 2V
2 series · 3 of 3 positions shown · non-contrast
Comparison: 11/26/2016

CLINICAL DATA: Tachycardia

EXAM:
CHEST - 2 VIEW

[Series 2: chest lat · 0.14mm/px · 2 of 2 slices shown]
[im 1/2]
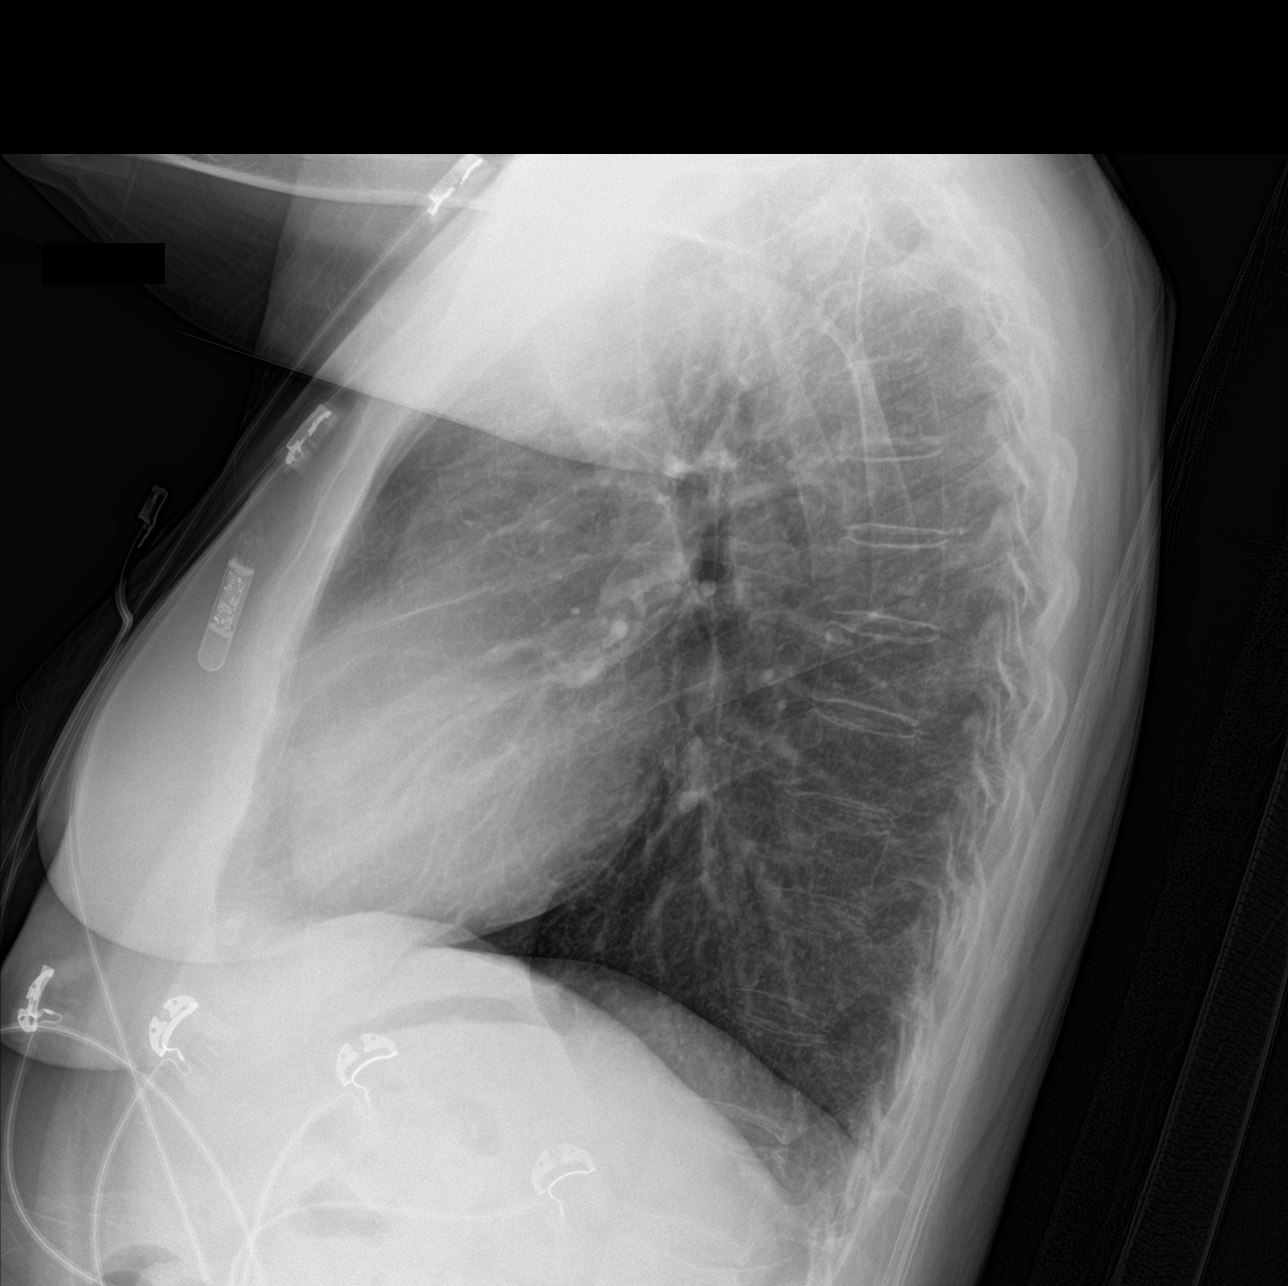
[im 2/2]
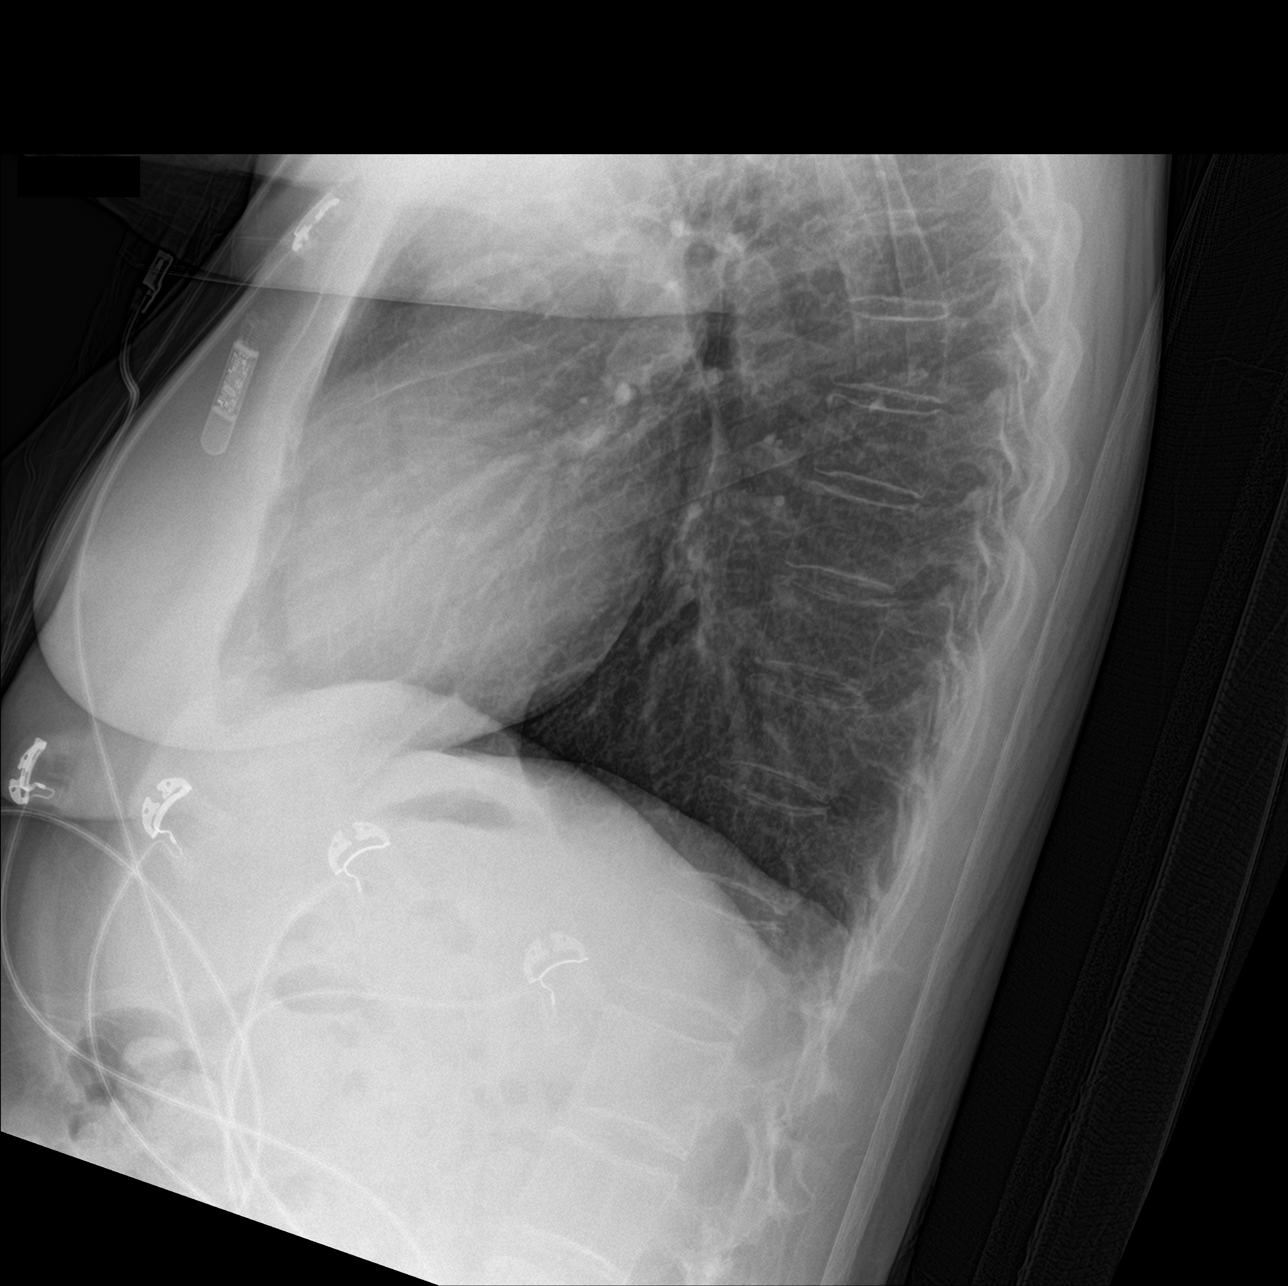

[chest ap]
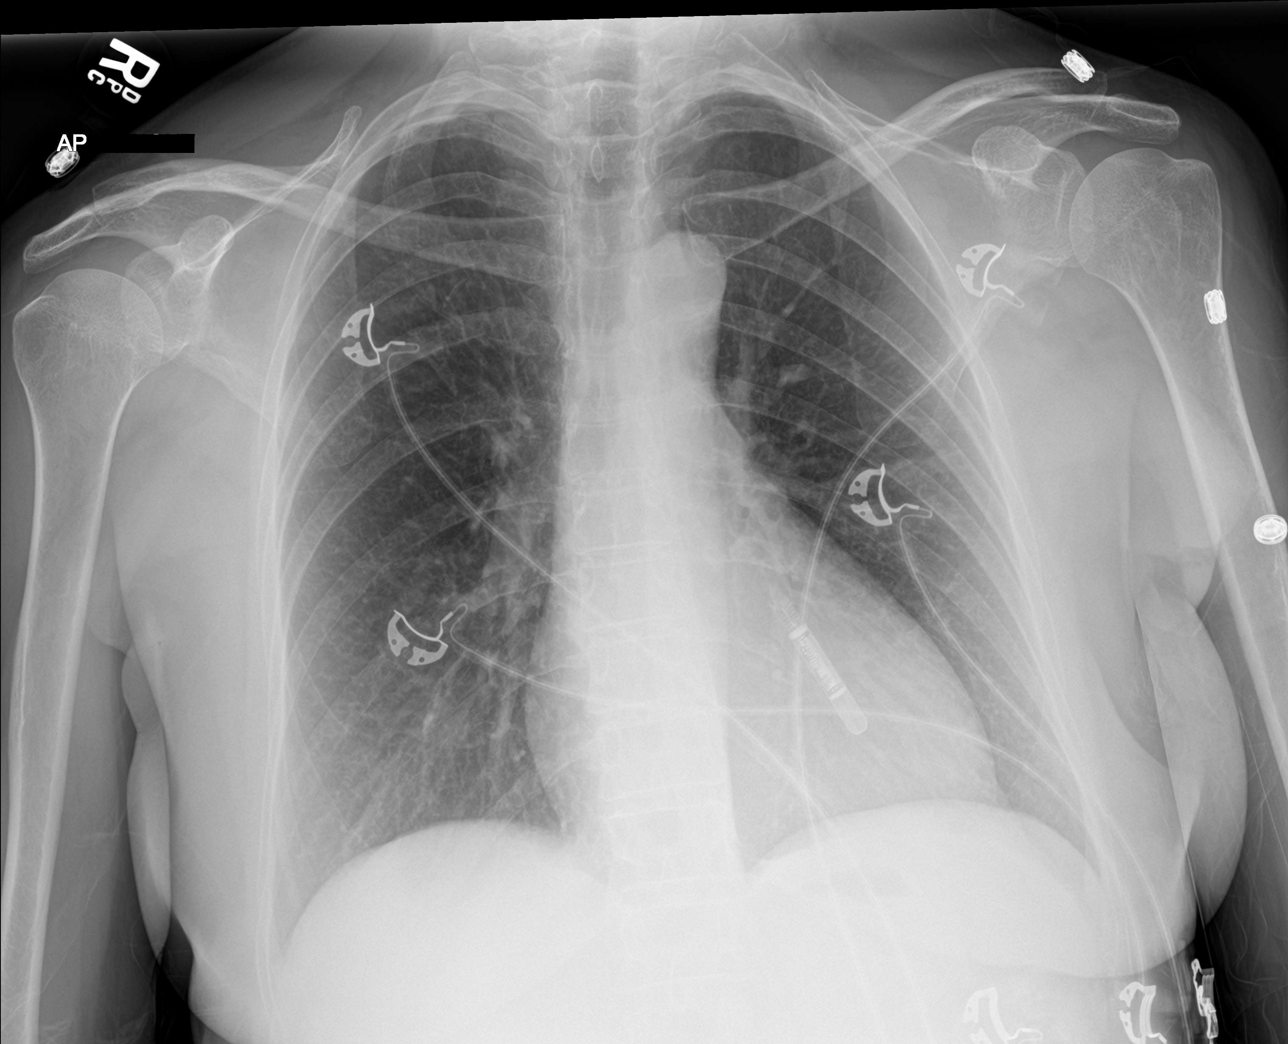

[3 of 3 positions shown; findings below may reference images not displayed]

FINDINGS: The heart size and mediastinal contours are within normal limits.
Cardiac monitoring device noted projecting over the left chest wall.
Both lungs are clear. The visualized skeletal structures are
unremarkable.
IMPRESSION: No active cardiopulmonary disease.

## 2018-03-13 MED ORDER — DILTIAZEM HCL 25 MG/5ML IV SOLN
10.0000 mg | Freq: Once | INTRAVENOUS | Status: AC
  Administered 2018-03-13: 10 mg via INTRAVENOUS
  Filled 2018-03-13: qty 5

## 2018-03-13 MED ORDER — ACETAMINOPHEN 650 MG RE SUPP: 650.0000 mg | Freq: Four times a day (QID) | RECTAL | Status: DC | PRN

## 2018-03-13 MED ORDER — ONDANSETRON HCL 4 MG PO TABS: 4.0000 mg | ORAL_TABLET | Freq: Four times a day (QID) | ORAL | Status: DC | PRN

## 2018-03-13 MED ORDER — ONDANSETRON HCL 4 MG/2ML IJ SOLN
4.0000 mg | Freq: Four times a day (QID) | INTRAMUSCULAR | Status: DC | PRN
  Administered 2018-03-14: 4 mg via INTRAVENOUS
  Filled 2018-03-13: qty 2

## 2018-03-13 MED ORDER — DILTIAZEM HCL-DEXTROSE 100-5 MG/100ML-% IV SOLN (PREMIX)
5.0000 mg/h | INTRAVENOUS | Status: DC
  Administered 2018-03-13: 5 mg/h via INTRAVENOUS
  Administered 2018-03-14: 15 mg/h via INTRAVENOUS
  Filled 2018-03-13 (×2): qty 100

## 2018-03-13 MED ORDER — ACETAMINOPHEN 325 MG PO TABS: 650.0000 mg | ORAL_TABLET | Freq: Four times a day (QID) | ORAL | Status: DC | PRN

## 2018-03-13 MED ORDER — POTASSIUM CHLORIDE CRYS ER 20 MEQ PO TBCR
40.0000 meq | EXTENDED_RELEASE_TABLET | Freq: Once | ORAL | Status: AC
  Administered 2018-03-13: 40 meq via ORAL
  Filled 2018-03-13: qty 2

## 2018-03-13 NOTE — ED Provider Notes (Addendum)
[facility] EMERGENCY DEPARTMENT Provider Note   CSN: [id number] Arrival date & time: 03/13/18  2012     History   Chief Complaint Chief Complaint  Patient presents with  . Tachycardia    HPI Kathryn Arnold is a 72 y.o. female.  Patient brought in by EMS for rapid heart rate consistent with atrial fibrillation.  Patient has a known history of atrial fibrillation followed by [facility] cardiology.  Patient is on diltiazem and flecainide.  Has a loop recorder.  In the past has had an ablation.  Patient states she is not in atrial fib all the time normally will convert back to normal rhythm.  She is on a blood thinner Xarelto as well and has been taking that.  EMS reported the heart rate was as high as the 130s.  Patient denies any chest pain shortness of breath.  Patient denies any palpitations as well but could tell her heart rate was fast.  Upon arrival here heart rate was more in the 120s.  Blood pressure was 152/85.  No syncope but patient stated she was feeling lightheaded.     Past Medical History:  Diagnosis Date  . Abnormal Pap smear   . Arthritis    "fingers" (10/31/2016)  . Complication of anesthesia    a little slow to wake up-stayed drowsy  . 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 HSV-2 (herpes simplex virus 2) infection   . Hypercholesteremia   . Hyperlipidemia with target LDL less than 100   . Hypertension   . Osteoporosis, unspecified    Last DEXA 10/2011   . Paroxysmal atrial fibrillation (HCC)    s/p ablation Afib and flutter 11/03/14  . PONV (postoperative nausea and vomiting)   . Vitamin D deficiency     Patient Active Problem List   Diagnosis Date Noted  . PONV (postoperative nausea and vomiting)   . Complication of anesthesia   . Arthritis   . Paroxysmal atrial fibrillation (HCC) 10/31/2016  . GAD (generalized anxiety disorder) 01/28/2014  . Essential hypertension, benign 01/20/2014  . Atrial fibrillation (HCC) 12/31/2013  . HSV-2 (herpes simplex  virus 2) infection 12/18/2013  . Vitamin D deficiency 12/18/2013  . Hyperlipidemia with target LDL less than 100 08/28/2012  . Osteoporosis 08/28/2012  . HPV (human papilloma virus) infection 09/23/2011    Past Surgical History:  Procedure Laterality Date  . ATRIAL FIBRILLATION ABLATION N/A 10/31/2016   Procedure: Atrial Fibrillation Ablation;  Surgeon: Hillis Range, MD;  Location: [facility] INVASIVE CV LAB;  Service: Cardiovascular;  Laterality: N/A;  . BREAST LUMPECTOMY Left   . DILATION AND CURETTAGE OF UTERUS    . ELECTROPHYSIOLOGIC STUDY N/A 11/03/2014   Procedure: Atrial Fibrillation Ablation;  Surgeon: Hillis Range, MD;  Location: [facility] INVASIVE CV LAB;  Service: Cardiovascular;  Laterality: N/A;  . implantable loop recorder placement  03/08/2018   Medtronic Reveal LINQ implantation by Dr Johney Frame for afib management post ablation  . LAPAROSCOPY ABDOMEN DIAGNOSTIC  1990s  . TEE WITHOUT CARDIOVERSION N/A 10/30/2016   Procedure: TRANSESOPHAGEAL ECHOCARDIOGRAM (TEE);  Surgeon: Thurmon Fair, MD;  Location: [facility] ENDOSCOPY;  Service: Cardiovascular;  Laterality: N/A;  . TUBAL LIGATION  1978     OB History    Gravida  2   Para  2   Term  2   Preterm  0   AB  0   Living  2     SAB  0   TAB  0   Ectopic  0  Multiple  0   Live Births               Home Medications    Prior to Admission medications   Medication Sig Start Date End Date Taking? Authorizing Provider  alendronate (FOSAMAX) 70 MG tablet Take 1 tablet (70 mg total) by mouth every 7 (seven) days. Take with a full glass of water on an empty stomach. 12/04/17 12/04/18  [name] A, FNP  amoxicillin-clavulanate (AUGMENTIN) 875-125 MG tablet Take 1 tablet by mouth 2 (two) times daily. 01/12/18   Johna Sheriff, MD  CARDIZEM LA 120 MG 24 hr tablet TAKE 1 TABLET BY MOUTH ONCE DAILY 02/05/18   [name] A, FNP  diltiazem (CARDIZEM) 60 MG tablet Take 60 mg by mouth every 4 (four) hours as needed (for afib). Take  1/2 to 1 tablet by mouth every 4 hours AS NEEDED for heart rate >100 as long as blood pressure >100     [provider]  flecainide (TAMBOCOR) 50 MG tablet TAKE 1 TABLET BY MOUTH TWICE DAILY 01/04/18   Newman Nip, NP  fluticasone (FLONASE) 50 MCG/ACT nasal spray Place 2 sprays into both nostrils daily. 10/11/17   Junie Spencer, FNP  meclizine (ANTIVERT) 25 MG tablet Take 0.5-1 tablets (12.5-25 mg total) by mouth 3 (three) times daily as needed for dizziness. 12/07/17   Raliegh Ip, DO  polyethylene glycol powder (GLYCOLAX/MIRALAX) powder Take 17 g by mouth 2 (two) times daily as needed. 01/29/18   Junie Spencer, FNP  rivaroxaban (XARELTO) 20 MG TABS tablet Take 1 tablet (20 mg total) by mouth at bedtime. 11/29/17   [name] A, FNP  rosuvastatin (CRESTOR) 20 MG tablet Take 1 tablet (20 mg total) by mouth at bedtime. 11/29/17   Junie Spencer, FNP  valACYclovir (VALTREX) 500 MG tablet TAKE 1 TABLET BY MOUTH ONCE DAILY WITH LUNCH 11/29/17   Junie Spencer, FNP    Family History Family History  Problem Relation Age of Onset  . Osteoporosis Mother   . Cancer Mother        breast  . Hypertension Mother   . Heart disease Father   . Other Neg Hx     Social History Social History   Tobacco Use  . Smoking status: Never Smoker  . Smokeless tobacco: Never Used  Substance Use Topics  . Alcohol use: No  . Drug use: No     Allergies   Patient has no known allergies.   Review of Systems Review of Systems  HENT: Negative for congestion.   Eyes: Negative for visual disturbance.  Respiratory: Negative for shortness of breath.   Cardiovascular: Negative for chest pain, palpitations and leg swelling.  Gastrointestinal: Negative for abdominal pain, nausea and vomiting.  Genitourinary: Negative for dysuria.  Musculoskeletal: Negative for back pain.  Skin: Negative for rash.  Neurological: Positive for light-headedness.  Hematological: Bruises/bleeds easily.    Psychiatric/Behavioral: Negative for confusion.     Physical Exam Updated Vital Signs BP 114/82   Pulse (!) 113   Temp 98.1 F (36.7 C) (Oral)   Resp 20   Ht 1.651 m (5\' 5" )   Wt 68 kg   SpO2 96%   BMI 24.96 kg/m   Physical Exam  Constitutional: She is oriented to person, place, and time. She appears well-developed and well-nourished. No distress.  HENT:  Head: Normocephalic and atraumatic.  Eyes: Conjunctivae and EOM are normal.  Neck: Neck supple.  Cardiovascular: Normal heart  sounds.  Tachycardia rate and irregular  Pulmonary/Chest: Effort normal and breath sounds normal. No respiratory distress. She has no wheezes. She has no rales.  Abdominal: Soft. Bowel sounds are normal. There is no tenderness.  Musculoskeletal: Normal range of motion. She exhibits no edema.  Neurological: She is alert and oriented to person, place, and time. No cranial nerve deficit or sensory deficit. She exhibits normal muscle tone. Coordination normal.  Skin: Skin is warm.  Nursing note and vitals reviewed.    ED Treatments / Results  Labs (all labs ordered are listed, but only abnormal results are displayed) Labs Reviewed  BASIC METABOLIC PANEL - Abnormal; Notable for the following components:      Result Value   Glucose, Bld 121 (*)    All other components within normal limits  CBC - Abnormal; Notable for the following components:   RBC 5.12 (*)    All other components within normal limits  TROPONIN I    EKG EKG Interpretation  Date/Time:  Wednesday March 13 2018 20:19:14 EST Ventricular Rate:  107 PR Interval:    QRS Duration: 121 QT Interval:  342 QTC Calculation: 457 R Axis:   -14 Text Interpretation:  Atrial fibrillation Paired ventricular premature complexes Nonspecific intraventricular conduction delay Borderline ST depression, lateral leads Reconfirmed by Vanetta MuldersZackowski, Isaih Bulger [phone]([id number]) on 03/13/2018 8:28:54 PM   Radiology Dg Chest 2 View  Result Date:  03/13/2018 CLINICAL DATA:  Tachycardia EXAM: CHEST - 2 VIEW COMPARISON:  11/26/2016 FINDINGS: The heart size and mediastinal contours are within normal limits. Cardiac monitoring device noted projecting over the left chest wall. Both lungs are clear. The visualized skeletal structures are unremarkable. IMPRESSION: No active cardiopulmonary disease. Electronically Signed   By: Elige KoHetal  Patel   On: 03/13/2018 20:58    Procedures Procedures (including critical care time)  CRITICAL CARE Performed by: Vanetta MuldersScott Darleen Moffitt Total critical care time: 30 minutes Critical care time was exclusive of separately billable procedures and treating other patients. Critical care was necessary to treat or prevent imminent or life-threatening deterioration. Critical care was time spent personally by me on the following activities: development of treatment plan with patient and/or surrogate as well as nursing, discussions with consultants, evaluation of patient's response to treatment, examination of patient, obtaining history from patient or surrogate, ordering and performing treatments and interventions, ordering and review of laboratory studies, ordering and review of radiographic studies, pulse oximetry and re-evaluation of patient's condition.   Medications Ordered in ED Medications  diltiazem (CARDIZEM) 100 mg in dextrose 5% 100mL (1 mg/mL) infusion (15 mg/hr Intravenous Rate/Dose Verify 03/13/18 2158)  diltiazem (CARDIZEM) injection 10 mg (10 mg Intravenous Given 03/13/18 2109)     Initial Impression / Assessment and Plan / ED Course  I have reviewed the triage vital signs and the nursing notes.  Pertinent labs & imaging results that were available during my care of the patient were reviewed by me and considered in my medical decision making (see chart for details).     Patient with known history of atrial fib.  Heart rate was more from 1 10-1 20s here.  Patient given a IV dose of 10 mg of diltiazem.   Brought heart rate down into the 80s was then rapidly went back up to around 130s.  Patient started on diltiazem drip.  Patient never had super rapid heart rate.  Technically patient could have been cardioverted with her rates when I was observing her being more in the 100-1 20 range did not feel that  she needed to be cardioverted.  Chest x-ray negative troponin pending labs without significant abnormalities.  Patient normally is on 120 mg of Cardizem long-acting that she takes in the evenings.  And she takes 50 mg of flecainide twice a day.  She had her doses of flecainide.  She did not have her evening dose of diltiazem.  She took 60 mg of diltiazem a.m. at home which she normally takes for breakthrough.  Does not like taking her normal long-acting diltiazem on an empty stomach.  Patient will be admitted by the hospitalist.  We will continue the diltiazem drip.  Patient responding to that well.  Now heart rates down to the upper 80s low 100s.  Patient nontoxic no acute distress.  Her blood pressure is tolerating the diltiazem drip fine.  Final Clinical Impressions(s) / ED Diagnoses   Final diagnoses:  Atrial fibrillation with RVR [facility])    ED Discharge Orders    None       Vanetta Mulders, MD 03/13/18 2200    Vanetta Mulders, MD 03/13/18 2227

## 2018-03-13 NOTE — ED Notes (Signed)
ekg given to dr zackowski 

## 2018-03-13 NOTE — ED Triage Notes (Signed)
Pt brought in by [facility] for c/o increased heart rate; pt has a hx of a fib and took her cardizem at home with no improvement; pt heart rate in the 130's en route with ems; pt denies any pain or palpitations

## 2018-03-13 NOTE — H&P (Signed)
History and Physical    Kathryn Arnold XBJ:[id number] DOB: 24-Oct-1945 DOA: 03/13/2018  PCP: Junie Spencer, FNP   Patient coming from: Home.  I have personally briefly reviewed patient's old medical records in [facility] Link  Chief Complaint: Increased heart rate.  HPI: Kathryn Arnold is a 72 y.o. female with medical history significant of abnormal Pap smear, arthritis of fingers, history of HPV infection, history of HSV-2 infection, hyperlipidemia, hypertension, osteoporosis, vitamin D deficiency, paroxysmal atrial fibrillation with history of ablation in July 2016 loop recorder placement who is coming to the emergency department with complaints of increased heart rate at home in the 130s that did not respond to  her taking 60 mg of Cardizem at home.  She had palpitations, mild nausea and mild lightheadedness.  She denies dyspnea, chest pain, diaphoresis or emesis.  She denies any history of PND, orthopnea or pitting edema of the lower extremities.  She does not consume caffeine or any other stimulants.  She states that she had a bowel movement after her palpitations began, which usually happens each time that she goes into A. fib with RVR.  She otherwise denies abdominal pain, diarrhea, melena or hematochezia.  She gets occasionally constipated.  She denies dysuria, frequency or hematuria.  She denies polyuria, polydipsia, polyphagia or blurred vision.  ED Course: Initial vital signs temperature 98.1 F, pulse 120, respirations 20, blood pressure 152/85 mmHg and O2 sat 98% on room air.  The patient received a 10 mg bolus of IV Cardizem, followed by a Cardizem continuous infusion.  I ordered potassium 40 mEq p.o. x1 dose and magnesium sulfate 2 g IVPB x1 dose.  Her EKG is atrial fibrillation with prior VPCs, nonspecific intraventricular conduction delay, borderline ST depression on lateral leads.  First troponin level was normal. White count is 5.9, hemoglobin 15.0 g/dL and platelets 308.  BMP  shows normal electrolytes, potassium is barely normal at 3.5 mmol/L.  BUN 20, creatinine 0.64, calcium 9.3, magnesium 2.0, phosphorus 2.8 and glucose is 121 mg/dL.  Her TSH was 3.250 IU/mL.  Her chest radiograph did not have any active cardiopulmonary disease.  Review of Systems: As per HPI otherwise 10 point review of systems negative.   Past Medical History:  Diagnosis Date  . Abnormal Pap smear   . Arthritis    "fingers" (10/31/2016)  . Complication of anesthesia    a little slow to wake up-stayed drowsy  . 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 HSV-2 (herpes simplex virus 2) infection   . Hypercholesteremia   . Hyperlipidemia with target LDL less than 100   . Hypertension   . Osteoporosis, unspecified    Last DEXA 10/2011   . Paroxysmal atrial fibrillation (HCC)    s/p ablation Afib and flutter 11/03/14  . PONV (postoperative nausea and vomiting)   . Vitamin D deficiency     Past Surgical History:  Procedure Laterality Date  . ATRIAL FIBRILLATION ABLATION N/A 10/31/2016   Procedure: Atrial Fibrillation Ablation;  Surgeon: Hillis Range, MD;  Location: [facility] INVASIVE CV LAB;  Service: Cardiovascular;  Laterality: N/A;  . BREAST LUMPECTOMY Left   . DILATION AND CURETTAGE OF UTERUS    . ELECTROPHYSIOLOGIC STUDY N/A 11/03/2014   Procedure: Atrial Fibrillation Ablation;  Surgeon: Hillis Range, MD;  Location: [facility] INVASIVE CV LAB;  Service: Cardiovascular;  Laterality: N/A;  . implantable loop recorder placement  03/08/2018   Medtronic Reveal LINQ implantation by Dr Johney Frame for afib management post ablation  . LAPAROSCOPY  ABDOMEN DIAGNOSTIC  1990s  . TEE WITHOUT CARDIOVERSION N/A 10/30/2016   Procedure: TRANSESOPHAGEAL ECHOCARDIOGRAM (TEE);  Surgeon: Thurmon Fair, MD;  Location: [facility] ENDOSCOPY;  Service: Cardiovascular;  Laterality: N/A;  . TUBAL LIGATION  1978     reports that she has never smoked. She has never used smokeless tobacco. She reports that she does not drink alcohol or  use drugs.  No Known Allergies  Family History  Problem Relation Age of Onset  . Osteoporosis Mother   . Cancer Mother        breast  . Hypertension Mother   . Heart disease Father   . Other Neg Hx    Prior to Admission medications   Medication Sig Start Date End Date Taking? Authorizing Provider  alendronate (FOSAMAX) 70 MG tablet Take 1 tablet (70 mg total) by mouth every 7 (seven) days. Take with a full glass of water on an empty stomach. 12/04/17 12/04/18  [name] A, FNP  amoxicillin-clavulanate (AUGMENTIN) 875-125 MG tablet Take 1 tablet by mouth 2 (two) times daily. 01/12/18   Johna Sheriff, MD  CARDIZEM LA 120 MG 24 hr tablet TAKE 1 TABLET BY MOUTH ONCE DAILY 02/05/18   [name] A, FNP  diltiazem (CARDIZEM) 60 MG tablet Take 60 mg by mouth every 4 (four) hours as needed (for afib). Take 1/2 to 1 tablet by mouth every 4 hours AS NEEDED for heart rate >100 as long as blood pressure >100     [provider]  flecainide (TAMBOCOR) 50 MG tablet TAKE 1 TABLET BY MOUTH TWICE DAILY 01/04/18   Newman Nip, NP  fluticasone (FLONASE) 50 MCG/ACT nasal spray Place 2 sprays into both nostrils daily. 10/11/17   Junie Spencer, FNP  meclizine (ANTIVERT) 25 MG tablet Take 0.5-1 tablets (12.5-25 mg total) by mouth 3 (three) times daily as needed for dizziness. 12/07/17   Raliegh Ip, DO  polyethylene glycol powder (GLYCOLAX/MIRALAX) powder Take 17 g by mouth 2 (two) times daily as needed. 01/29/18   Junie Spencer, FNP  rivaroxaban (XARELTO) 20 MG TABS tablet Take 1 tablet (20 mg total) by mouth at bedtime. 11/29/17   [name] A, FNP  rosuvastatin (CRESTOR) 20 MG tablet Take 1 tablet (20 mg total) by mouth at bedtime. 11/29/17   Junie Spencer, FNP  valACYclovir (VALTREX) 500 MG tablet TAKE 1 TABLET BY MOUTH ONCE DAILY WITH LUNCH 11/29/17   Junie Spencer, [place]    Physical Exam: Vitals:   03/13/18 2140 03/13/18 2150 03/13/18 2200 03/13/18 2210  BP: 121/71  114/82 115/78 125/90  Pulse: (!) 112 (!) 113 (!) 117 81  Resp: 16 20 18 17   Temp:      TempSrc:      SpO2: 96% 96% 95% 96%  Weight:      Height:        Constitutional: NAD, calm, comfortable Eyes: PERRL, lids and conjunctivae normal ENMT: Mucous membranes are moist. Posterior pharynx clear of any exudate or lesions. Neck: Normal, supple, no masses, no thyromegaly Respiratory: clear to auscultation bilaterally, no wheezing, no crackles. Normal respiratory effort. No accessory muscle use.  Cardiovascular: Irregularly irregular in the 80s, no murmurs / rubs / gallops. No extremity edema. 2+ pedal pulses. No carotid bruits.  Abdomen: Soft, no tenderness, no masses palpated. No hepatosplenomegaly. Bowel sounds positive.  Musculoskeletal: no clubbing / cyanosis. Good ROM, no contractures. Normal muscle tone.  Skin: no rashes, lesions, ulcers on limited dermatological examination. Neurologic: CN 2-12 grossly  intact. Sensation intact, DTR normal. Strength 5/5 in all 4.  Psychiatric: Normal judgment and insight. Alert and oriented x 4. Normal mood.   Labs on Admission: I have personally reviewed following labs and imaging studies  CBC: Recent Labs  Lab 03/13/18 2107  WBC 5.9  HGB 15.0  HCT 45.9  MCV 89.6  PLT 245   Basic Metabolic Panel: Recent Labs  Lab 03/13/18 2107  NA 139  K 3.5  CL 107  CO2 22  GLUCOSE 121*  BUN 20  CREATININE 0.64  CALCIUM 9.3   GFR: Estimated Creatinine Clearance: 58.9 mL/min (by C-G formula based on SCr of 0.64 mg/dL). Liver Function Tests: No results for input(s): AST, ALT, ALKPHOS, BILITOT, PROT, ALBUMIN in the last 168 hours. No results for input(s): LIPASE, AMYLASE in the last 168 hours. No results for input(s): AMMONIA in the last 168 hours. Coagulation Profile: No results for input(s): INR, PROTIME in the last 168 hours. Cardiac Enzymes: No results for input(s): CKTOTAL, CKMB, CKMBINDEX, TROPONINI in the last 168 hours. BNP (last 3  results) No results for input(s): PROBNP in the last 8760 hours. HbA1C: No results for input(s): HGBA1C in the last 72 hours. CBG: No results for input(s): GLUCAP in the last 168 hours. Lipid Profile: No results for input(s): CHOL, HDL, LDLCALC, TRIG, CHOLHDL, LDLDIRECT in the last 72 hours. Thyroid Function Tests: No results for input(s): TSH, T4TOTAL, FREET4, T3FREE, THYROIDAB in the last 72 hours. Anemia Panel: No results for input(s): VITAMINB12, FOLATE, FERRITIN, TIBC, IRON, RETICCTPCT in the last 72 hours. Urine analysis:    Component Value Date/Time   COLORURINE YELLOW 11/18/2011 1925   APPEARANCEUR CLEAR 11/18/2011 1925   LABSPEC 1.015 11/18/2011 1925   PHURINE 7.0 11/18/2011 1925   GLUCOSEU NEGATIVE 11/18/2011 1925   HGBUR NEGATIVE 11/18/2011 1925   BILIRUBINUR neg 03/19/2015 1011   KETONESUR NEGATIVE 11/18/2011 1925   PROTEINUR trc 03/19/2015 1011   PROTEINUR NEGATIVE 11/18/2011 1925   UROBILINOGEN negative 03/19/2015 1011   UROBILINOGEN 0.2 11/18/2011 1925   NITRITE neg 03/19/2015 1011   NITRITE NEGATIVE 11/18/2011 1925   LEUKOCYTESUR large (3+) (A) 03/19/2015 1011    Radiological Exams on Admission: Dg Chest 2 View  Result Date: 03/13/2018 CLINICAL DATA:  Tachycardia EXAM: CHEST - 2 VIEW COMPARISON:  11/26/2016 FINDINGS: The heart size and mediastinal contours are within normal limits. Cardiac monitoring device noted projecting over the left chest wall. Both lungs are clear. The visualized skeletal structures are unremarkable. IMPRESSION: No active cardiopulmonary disease. Electronically Signed   By: Elige Ko   On: 03/13/2018 20:58   10/30/2016 TEE  ------------------------------------------------------------------- LV EF: 55% -   60%  ------------------------------------------------------------------- Indications:      Atrial fibrillation - 427.31.  ------------------------------------------------------------------- History:   PMH:   Atrial  fibrillation.  Risk factors: Hypertension. Dyslipidemia.  ------------------------------------------------------------------- Study Conclusions  - Left ventricle: Systolic function was normal. The estimated   ejection fraction was in the range of 55% to 60%. Wall motion was   normal; there were no regional wall motion abnormalities. - Aortic valve: There was trivial regurgitation. - Mitral valve: There was mild regurgitation. - Left atrium: The atrium was mildly dilated. No evidence of   thrombus in the atrial cavity or appendage. There was   mild continuous spontaneous echo contrast in the cavity. - Right atrium: The atrium was mildly dilated. No evidence of   thrombus in the atrial cavity or appendage. - Pulmonary arteries: Systolic pressure was mildly increased. PA  peak pressure: 33 mm Hg (S).   EKG: Independently reviewed. Vent. rate 107 BPM PR interval * ms QRS duration 121 ms QT/QTc 342/457 ms P-R-T axes 0 -14 78 Atrial fibrillation Paired ventricular premature complexes Nonspecific intraventricular conduction delay Borderline ST depression, lateral leads  Assessment/Plan Principal Problem:   Atrial fibrillation with RVR (HCC) CHA?DS?-VASc Score of at least 3. Observation/stepdown. Continue Cardizem infusion. Continue flecainide 50 mg p.o. twice daily. Optimize magnesium and potassium. Follow-up troponin level. Continue Xarelto 20 mg p.o. every evening. Transition to oral Cardizem in a.m. if possible. Consider cardiology evaluation if unable to treat RVR.  Active Problems:   Hyperlipidemia with target LDL less than 100 Continue Crestor 20 mg p.o. at bedtime. Monitor LFTs as needed. Fasting lipid profile follow-up as an outpatient.    HSV-2 (herpes simplex virus 2) infection Continue Valtrex 500 mg p.o. daily with lunch.    Essential hypertension, benign Continue Cardizem 120 mg p.o. daily. Advised to use magnesium oxide to avoid constipation.   DVT  prophylaxis: On Xarelto. Code Status: Full code. Family Communication: Disposition Plan: Observation for rate control with Cardizem infusion and electrolyte optimization. Consults called: Routine cardiology consult in a.m. Admission status: Observation/Telemetry.   Bobette Mo MD [facility] Pager [phone].  If 7PM-7AM, please contact night-coverage [email or web address] Password [facility]  03/13/2018, 10:19 PM

## 2018-03-14 ENCOUNTER — Other Ambulatory Visit: Payer: Self-pay

## 2018-03-14 DIAGNOSIS — E785 Hyperlipidemia, unspecified: Secondary | ICD-10-CM | POA: Diagnosis not present

## 2018-03-14 DIAGNOSIS — I10 Essential (primary) hypertension: Secondary | ICD-10-CM

## 2018-03-14 DIAGNOSIS — I4891 Unspecified atrial fibrillation: Secondary | ICD-10-CM

## 2018-03-14 LAB — BASIC METABOLIC PANEL
ANION GAP: 6 (ref 5–15)
BUN: 18 mg/dL (ref 8–23)
CHLORIDE: 111 mmol/L (ref 98–111)
CO2: 22 mmol/L (ref 22–32)
Calcium: 8.9 mg/dL (ref 8.9–10.3)
Creatinine, Ser: 0.61 mg/dL (ref 0.44–1.00)
GFR calc Af Amer: >60 mL/min (ref >60)
Glucose, Bld: 142 mg/dL — ABNORMAL HIGH (ref 70–99)
POTASSIUM: 4 mmol/L (ref 3.5–5.1)
SODIUM: 139 mmol/L (ref 135–145)

## 2018-03-14 LAB — TROPONIN I: Troponin I: <0.03 ng/mL (ref <0.03)

## 2018-03-14 LAB — MRSA PCR SCREENING: MRSA BY PCR: NEGATIVE

## 2018-03-14 MED ORDER — FLECAINIDE ACETATE 50 MG PO TABS
50.0000 mg | ORAL_TABLET | Freq: Two times a day (BID) | ORAL | Status: DC
  Administered 2018-03-14: 50 mg via ORAL
  Filled 2018-03-14 (×4): qty 1

## 2018-03-14 MED ORDER — LORAZEPAM 2 MG/ML IJ SOLN: 0.5000 mg | Freq: Every evening | INTRAMUSCULAR | Status: DC | PRN

## 2018-03-14 MED ORDER — RIVAROXABAN 20 MG PO TABS: 20.0000 mg | ORAL_TABLET | Freq: Every day | ORAL | Status: DC

## 2018-03-14 MED ORDER — POLYETHYLENE GLYCOL 3350 17 G PO PACK: 17.0000 g | PACK | Freq: Two times a day (BID) | ORAL | Status: DC | PRN

## 2018-03-14 MED ORDER — ROSUVASTATIN CALCIUM 20 MG PO TABS: 20.0000 mg | ORAL_TABLET | Freq: Every day | ORAL | Status: DC

## 2018-03-14 MED ORDER — DILTIAZEM HCL ER COATED BEADS 180 MG PO CP24
180.0000 mg | ORAL_CAPSULE | Freq: Every day | ORAL | Status: DC
  Administered 2018-03-14: 180 mg via ORAL
  Filled 2018-03-14: qty 1

## 2018-03-14 MED ORDER — POLYETHYLENE GLYCOL 3350 17 GM/SCOOP PO POWD
17.0000 g | Freq: Two times a day (BID) | ORAL | Status: DC | PRN
  Filled 2018-03-14: qty 255

## 2018-03-14 MED ORDER — VALACYCLOVIR HCL 500 MG PO TABS
500.0000 mg | ORAL_TABLET | Freq: Every day | ORAL | Status: DC
  Administered 2018-03-14: 500 mg via ORAL
  Filled 2018-03-14 (×2): qty 1

## 2018-03-14 MED ORDER — DILTIAZEM HCL ER COATED BEADS 180 MG PO CP24: 180.0000 mg | ORAL_CAPSULE | Freq: Every day | ORAL | 0 refills | Status: DC

## 2018-03-14 MED ORDER — LORAZEPAM 2 MG/ML IJ SOLN
0.5000 mg | Freq: Every evening | INTRAMUSCULAR | Status: AC | PRN
  Administered 2018-03-14: 0.5 mg via INTRAVENOUS
  Filled 2018-03-14: qty 1

## 2018-03-14 MED ORDER — ALPRAZOLAM 0.25 MG PO TABS
0.2500 mg | ORAL_TABLET | Freq: Every evening | ORAL | Status: DC | PRN
  Administered 2018-03-14: 0.25 mg via ORAL
  Filled 2018-03-14: qty 1

## 2018-03-14 NOTE — Care Management Obs Status (Signed)
MEDICARE OBSERVATION STATUS NOTIFICATION   Patient Details  Name: Kathryn Arnold MRN: [id number] Date of Birth: 1946/02/17   Medicare Observation Status Notification Given:  Yes    Malcolm MetroChildress, Jaise Moser Demske, RN 03/14/2018, 10:38 AM

## 2018-03-14 NOTE — Progress Notes (Signed)
Patient ambulated two laps around ICU. Remained in NSR with no complications.

## 2018-03-14 NOTE — Plan of Care (Signed)
  Problem: Education: Goal: Knowledge of General Education information will improve Description Including pain rating scale, medication(s)/side effects and non-pharmacologic comfort measures Outcome: Progressing   Problem: Health Behavior/Discharge Planning: Goal: Ability to manage health-related needs will improve Outcome: Progressing   Problem: Clinical Measurements: Goal: Ability to maintain clinical measurements within normal limits will improve Outcome: Progressing Goal: Will remain free from infection Outcome: Progressing Goal: Respiratory complications will improve Outcome: Progressing Goal: Cardiovascular complication will be avoided Outcome: Progressing   Problem: Activity: Goal: Risk for activity intolerance will decrease Outcome: Progressing   Problem: Safety: Goal: Ability to remain free from injury will improve Outcome: Progressing   Problem: Skin Integrity: Goal: Risk for impaired skin integrity will decrease Outcome: Progressing

## 2018-03-14 NOTE — Discharge Summary (Signed)
Physician Discharge Summary  Kathryn Arnold:[id number] DOB: 12-Jan-1946 DOA: 03/13/2018  PCP: Junie Spencer, FNP  Admit date: 03/13/2018 Discharge date: 03/14/2018  Admitted From: Home Disposition: Home  Recommendations for Outpatient Follow-up:  1. Follow up with PCP in 1-2 weeks 2. Please obtain BMP/CBC in one week 3. Follow-up with cardiology/electrophysiology next week as previously scheduled  Discharge Condition: Stable CODE STATUS: Full code Diet recommendation: Heart healthy  Brief/Interim Summary: 72 year old female with a history of hypertension, paroxysmal atrial fibrillation who had a loop recorder placed approximately 1 week ago, presents to the emergency room with complaints of palpitations and increased heart rate.  She has taken an extra dose of Cardizem at home, but she was tachycardic in the 130s.  She was admitted to the hospital and started on Cardizem infusion.  Subsequently, she converted back to sinus rhythm.  She has maintained sinus rhythm despite being off the infusion.  Her baseline dose of Cardizem has been increased from 120 mg to 180 mg daily.  She is continued on flecainide.  She was ambulated and her heart rate remained stable.  She plans to follow-up with electrophysiology on Monday as previously scheduled.  She is otherwise stable for discharge.  Discharge Diagnoses:  Principal Problem:   Atrial fibrillation with RVR (HCC) Active Problems:   Hyperlipidemia with target LDL less than 100   HSV-2 (herpes simplex virus 2) infection   Essential hypertension, benign    Discharge Instructions  Discharge Instructions    Diet - low sodium heart healthy   Complete by:  As directed    Increase activity slowly   Complete by:  As directed      Allergies as of 03/14/2018   No Known Allergies     Medication List    STOP taking these medications   CARDIZEM LA 120 MG 24 hr tablet Generic drug:  diltiazem Replaced by:  diltiazem 180 MG 24 hr  capsule     TAKE these medications   alendronate 70 MG tablet Commonly known as:  FOSAMAX Take 1 tablet (70 mg total) by mouth every 7 (seven) days. Take with a full glass of water on an empty stomach.   diltiazem 180 MG 24 hr capsule Commonly known as:  CARDIZEM CD Take 1 capsule (180 mg total) by mouth daily. Start taking on:  03/15/2018 Replaces:  CARDIZEM LA 120 MG 24 hr tablet   diltiazem 60 MG tablet Commonly known as:  CARDIZEM Take 60 mg by mouth every 4 (four) hours as needed (for afib). Take 1/2 to 1 tablet by mouth every 4 hours AS NEEDED for heart rate >100 as long as blood pressure >100   docusate sodium 100 MG capsule Commonly known as:  COLACE Take 100 mg by mouth at bedtime as needed for mild constipation.   flecainide 50 MG tablet Commonly known as:  TAMBOCOR TAKE 1 TABLET BY MOUTH TWICE DAILY   fluticasone 50 MCG/ACT nasal spray Commonly known as:  FLONASE Place 2 sprays into both nostrils daily.   meclizine 25 MG tablet Commonly known as:  ANTIVERT Take 0.5-1 tablets (12.5-25 mg total) by mouth 3 (three) times daily as needed for dizziness.   polyethylene glycol powder powder Commonly known as:  GLYCOLAX/MIRALAX Take 17 g by mouth 2 (two) times daily as needed.   rivaroxaban 20 MG Tabs tablet Commonly known as:  XARELTO Take 1 tablet (20 mg total) by mouth at bedtime.   rosuvastatin 20 MG tablet Commonly known as:  CRESTOR  Take 1 tablet (20 mg total) by mouth at bedtime.   valACYclovir 500 MG tablet Commonly known as:  VALTREX TAKE 1 TABLET BY MOUTH ONCE DAILY WITH LUNCH       No Known Allergies  Consultations:     Procedures/Studies: Dg Chest 2 View  Result Date: 03/13/2018 CLINICAL DATA:  Tachycardia EXAM: CHEST - 2 VIEW COMPARISON:  11/26/2016 FINDINGS: The heart size and mediastinal contours are within normal limits. Cardiac monitoring device noted projecting over the left chest wall. Both lungs are clear. The visualized skeletal  structures are unremarkable. IMPRESSION: No active cardiopulmonary disease. Electronically Signed   By: Elige KoHetal  Patel   On: 03/13/2018 20:58       Subjective: Feeling better.  No chest pain shortness of breath.  Overnight, she converted to sinus rhythm.  Discharge Exam: Vitals:   03/14/18 1330 03/14/18 1345 03/14/18 1400 03/14/18 1427  BP: (!) 100/56 97/61 (!) 98/56 109/74  Pulse: (!) 54 (!) 56 (!) 51 61  Resp: (!) 21 16 14 15   Temp:      TempSrc:      SpO2: 96% 97% 96% 97%  Weight:      Height:        General: Pt is alert, awake, not in acute distress Cardiovascular: RRR, S1/S2 +, no rubs, no gallops Respiratory: CTA bilaterally, no wheezing, no rhonchi Abdominal: Soft, NT, ND, bowel sounds + Extremities: no edema, no cyanosis    The results of significant diagnostics from this hospitalization (including imaging, microbiology, ancillary and laboratory) are listed below for reference.     Microbiology: Recent Results (from the past 240 hour(s))  MRSA PCR Screening     Status: None   Collection Time: 03/13/18 10:50 PM  Result Value Ref Range Status   MRSA by PCR NEGATIVE NEGATIVE Final    Comment:        The GeneXpert MRSA Assay (FDA approved for NASAL specimens only), is one component of a comprehensive MRSA colonization surveillance program. It is not intended to diagnose MRSA infection nor to guide or monitor treatment for MRSA infections. Performed at [facility], [address] [place], [place] [address]      Labs: BNP (last 3 results) No results for input(s): BNP in the last 8760 hours. Basic Metabolic Panel: Recent Labs  Lab 03/13/18 2107 03/14/18 0354  NA 139 139  K 3.5 4.0  CL 107 111  CO2 22 22  GLUCOSE 121* 142*  BUN 20 18  CREATININE 0.64 0.61  CALCIUM 9.3 8.9  MG 2.0  --   PHOS 2.8  --    Liver Function Tests: No results for input(s): AST, ALT, ALKPHOS, BILITOT, PROT, ALBUMIN in the last 168 hours. No results for input(s): LIPASE,  AMYLASE in the last 168 hours. No results for input(s): AMMONIA in the last 168 hours. CBC: Recent Labs  Lab 03/13/18 2107  WBC 5.9  HGB 15.0  HCT 45.9  MCV 89.6  PLT 245   Cardiac Enzymes: Recent Labs  Lab 03/13/18 2107 03/14/18 0354  TROPONINI <0.03 <0.03   BNP: Invalid input(s): POCBNP CBG: No results for input(s): GLUCAP in the last 168 hours. D-Dimer No results for input(s): DDIMER in the last 72 hours. Hgb A1c No results for input(s): HGBA1C in the last 72 hours. Lipid Profile No results for input(s): CHOL, HDL, LDLCALC, TRIG, CHOLHDL, LDLDIRECT in the last 72 hours. Thyroid function studies Recent Labs    03/13/18 2107  TSH 3.250   Anemia work up  No results for input(s): VITAMINB12, FOLATE, FERRITIN, TIBC, IRON, RETICCTPCT in the last 72 hours. Urinalysis    Component Value Date/Time   COLORURINE YELLOW 11/18/2011 1925   APPEARANCEUR CLEAR 11/18/2011 1925   LABSPEC 1.015 11/18/2011 1925   PHURINE 7.0 11/18/2011 1925   GLUCOSEU NEGATIVE 11/18/2011 1925   HGBUR NEGATIVE 11/18/2011 1925   BILIRUBINUR neg 03/19/2015 1011   KETONESUR NEGATIVE 11/18/2011 1925   PROTEINUR trc 03/19/2015 1011   PROTEINUR NEGATIVE 11/18/2011 1925   UROBILINOGEN negative 03/19/2015 1011   UROBILINOGEN 0.2 11/18/2011 1925   NITRITE neg 03/19/2015 1011   NITRITE NEGATIVE 11/18/2011 1925   LEUKOCYTESUR large (3+) (A) 03/19/2015 1011   Sepsis Labs Invalid input(s): PROCALCITONIN,  WBC,  LACTICIDVEN Microbiology Recent Results (from the past 240 hour(s))  MRSA PCR Screening     Status: None   Collection Time: 03/13/18 10:50 PM  Result Value Ref Range Status   MRSA by PCR NEGATIVE NEGATIVE Final    Comment:        The GeneXpert MRSA Assay (FDA approved for NASAL specimens only), is one component of a comprehensive MRSA colonization surveillance program. It is not intended to diagnose MRSA infection nor to guide or monitor treatment for MRSA infections. Performed at  [facility], [address] [place], [place] [address]      Time coordinating discharge:  SIGNED:   Erick Blinks, MD  [facility] 03/14/2018, 4:16 PM Pager   If 7PM-7AM, please contact night-coverage [email or web address] Password [email or web address]

## 2018-03-18 ENCOUNTER — Ambulatory Visit (INDEPENDENT_AMBULATORY_CARE_PROVIDER_SITE_OTHER): Payer: 59 | Admitting: *Deleted

## 2018-03-18 DIAGNOSIS — I480 Paroxysmal atrial fibrillation: Secondary | ICD-10-CM

## 2018-03-18 LAB — CUP PACEART INCLINIC DEVICE CHECK
Date Time Interrogation Session: 20191125152233
Implantable Pulse Generator Implant Date: 20191115

## 2018-03-18 NOTE — Progress Notes (Signed)
Wound check appointment. Steri-strips removed. Wound without redness or edema. Incision edges approximated, wound well healed. Battery status: Good. R-waves 0.6177mV. 7 symptom episodes-- previously reviewed, ECGs appear AFib and some SR. 0 tachy episodes, 0 pause episodes, 0 brady episodes. 27 AF episodes (3.2% burden)+ Xarelto, flecainide, Diltiazem (increased to 180 mg daily 03/15/18)-- previously reviewed, per JA reprogrammed AT/AF recording threshold to only longest episode from all episodes. Monthly summary reports and ROV with Rudi CocoDonna Carroll 06/06/18.

## 2018-04-02 ENCOUNTER — Ambulatory Visit: Payer: 59 | Admitting: Family

## 2018-04-02 ENCOUNTER — Encounter: Payer: Self-pay | Admitting: Family

## 2018-04-02 VITALS — BP 161/94 | HR 56 | Temp 97.3°F | Ht 65.0 in | Wt 151.0 lb

## 2018-04-02 DIAGNOSIS — I480 Paroxysmal atrial fibrillation: Secondary | ICD-10-CM

## 2018-04-02 DIAGNOSIS — I10 Essential (primary) hypertension: Secondary | ICD-10-CM | POA: Diagnosis not present

## 2018-04-02 DIAGNOSIS — Z09 Encounter for follow-up examination after completed treatment for conditions other than malignant neoplasm: Secondary | ICD-10-CM

## 2018-04-02 MED ORDER — DILTIAZEM HCL ER COATED BEADS 180 MG PO CP24: 180.0000 mg | ORAL_CAPSULE | Freq: Every day | ORAL | 1 refills | Status: DC

## 2018-04-02 MED ORDER — DILTIAZEM HCL 60 MG PO TABS: ORAL_TABLET | ORAL | 6 refills | Status: DC

## 2018-04-02 MED ORDER — FLECAINIDE ACETATE 50 MG PO TABS: 50.0000 mg | ORAL_TABLET | Freq: Two times a day (BID) | ORAL | 3 refills | Status: DC

## 2018-04-02 MED ORDER — RIVAROXABAN 20 MG PO TABS: 20.0000 mg | ORAL_TABLET | Freq: Every day | ORAL | 1 refills | Status: DC

## 2018-04-02 NOTE — Patient Instructions (Signed)

## 2018-04-02 NOTE — Progress Notes (Signed)
Subjective:    Patient ID: Kathryn Arnold, female    DOB: 1945/05/13, 72 y.o.   MRN: [id number]  Chief Complaint  Patient presents with  . Hospitalization Follow-up   Pt presents to the office today for hospital follow up. She has a hx of uncontrolled A Fib. She is currently taking Cardizem 180 mg daily, Flecainide 50 mg BID, and Xarelto 20 mg daily. She has Cardizem 60 mg she can take as needed and then 30 mins later if still in A Fib she will take flecainide 50 mg.   She states she started feeling like she was going into A Fib and taken her Cardizema and Flecainide, but continued to be in A Fib. She went to the ED on 03/13/18 and was placed on Cardizem drip. She was then converted into sinus rhythm and discharged on 03/14/18.   She is followed by her Cardiologists and Electrophysiologists. She has a loop recorder placed that will remain for 3 years.   [facility] notes reviewed.   Hypertension  This is a chronic problem. The current episode started more than 1 year ago. The problem has been resolved since onset. The problem is controlled. Pertinent negatives include no peripheral edema or shortness of breath. The current treatment provides moderate improvement.     Review of Systems  Respiratory: Negative for shortness of breath.   All other systems reviewed and are negative.      Objective:   Physical Exam  Constitutional: She is oriented to person, place, and time. She appears well-developed and well-nourished. No distress.  HENT:  Head: Normocephalic and atraumatic.  Right Ear: External ear normal.  Left Ear: External ear normal.  Mouth/Throat: Oropharynx is clear and moist.  Eyes: Pupils are equal, round, and reactive to light.  Neck: Normal range of motion. Neck supple. No thyromegaly present.  Cardiovascular: Normal rate, regular rhythm, normal heart sounds and intact distal pulses.  No murmur heard. Pulmonary/Chest: Effort normal and breath sounds normal. No  respiratory distress. She has no wheezes.  Abdominal: Soft. Bowel sounds are normal. She exhibits no distension. There is no tenderness.  Musculoskeletal: Normal range of motion. She exhibits no edema or tenderness.  Neurological: She is alert and oriented to person, place, and time. She has normal reflexes. No cranial nerve deficit.  Skin: Skin is warm and dry.  Psychiatric: She has a normal mood and affect. Her behavior is normal. Judgment and thought content normal.  Vitals reviewed.   BP (!) 161/94   Pulse (!) 56   Temp (!) 97.3 F (36.3 C) (Oral)   Ht '5\' 5"'  (1.651 m)   Wt 151 lb (68.5 kg)   BMI 25.13 kg/m      Assessment & Plan:  Kathryn Arnold comes in today with chief complaint of Hospitalization Follow-up   Diagnosis and orders addressed:  1. Paroxysmal atrial fibrillation (HCC) - diltiazem (CARDIZEM CD) 180 MG 24 hr capsule; Take 1 capsule (180 mg total) by mouth daily.  Dispense: 90 capsule; Refill: 1 - flecainide (TAMBOCOR) 50 MG tablet; Take 1 tablet (50 mg total) by mouth 2 (two) times daily.  Dispense: 60 tablet; Refill: 3 - rivaroxaban (XARELTO) 20 MG TABS tablet; Take 1 tablet (20 mg total) by mouth at bedtime.  Dispense: 90 tablet; Refill: 1 - diltiazem (CARDIZEM) 60 MG tablet; Take 1/2 to 1 tablet by mouth every 4 hours AS NEEDED for heart rate >100 as long as blood pressure >100  Dispense: 30 tablet; Refill: 6 -  CBC with Differential/Platelet - CMP14+EGFR  2. Essential hypertension, benign - CBC with Differential/Platelet - CMP14+EGFR  3. Hospital discharge follow-up - CBC with Differential/Platelet - CMP14+EGFR   Labs pending Health Maintenance reviewed Diet and exercise encouraged  Follow up plan: 6 months and keep all follow up with specialists   Evelina Dun, FNP

## 2018-04-03 LAB — CBC WITH DIFFERENTIAL/PLATELET
BASOS ABS: 0 10*3/uL (ref 0.0–0.2)
BASOS: 1 %
EOS (ABSOLUTE): 0 10*3/uL (ref 0.0–0.4)
Eos: 1 %
HEMATOCRIT: 42.4 % (ref 34.0–46.6)
HEMOGLOBIN: 14.2 g/dL (ref 11.1–15.9)
Immature Grans (Abs): 0 10*3/uL (ref 0.0–0.1)
Immature Granulocytes: 0 %
LYMPHS ABS: 1.7 10*3/uL (ref 0.7–3.1)
LYMPHS: 32 %
MCH: 30.3 pg (ref 26.6–33.0)
MCHC: 33.5 g/dL (ref 31.5–35.7)
MCV: 91 fL (ref 79–97)
Monocytes Absolute: 0.4 10*3/uL (ref 0.1–0.9)
Monocytes: 8 %
NEUTROS ABS: 3.2 10*3/uL (ref 1.4–7.0)
Neutrophils: 58 %
PLATELETS: 264 10*3/uL (ref 150–450)
RBC: 4.68 x10E6/uL (ref 3.77–5.28)
RDW: 12.1 % — ABNORMAL LOW (ref 12.3–15.4)
WBC: 5.5 10*3/uL (ref 3.4–10.8)

## 2018-04-03 LAB — CMP14+EGFR
A/G RATIO: 2 (ref 1.2–2.2)
ALK PHOS: 54 IU/L (ref 39–117)
ALT: 19 IU/L (ref 0–32)
AST: 21 IU/L (ref 0–40)
Albumin: 4.7 g/dL (ref 3.5–4.8)
BUN/Creatinine Ratio: 22 (ref 12–28)
BUN: 16 mg/dL (ref 8–27)
Bilirubin Total: 0.4 mg/dL (ref 0.0–1.2)
CALCIUM: 10 mg/dL (ref 8.7–10.3)
CHLORIDE: 103 mmol/L (ref 96–106)
CO2: 23 mmol/L (ref 20–29)
Creatinine, Ser: 0.74 mg/dL (ref 0.57–1.00)
GFR calc non Af Amer: 82 mL/min/{1.73_m2} (ref >59)
GFR, EST AFRICAN AMERICAN: 95 mL/min/{1.73_m2} (ref >59)
GLOBULIN, TOTAL: 2.3 g/dL (ref 1.5–4.5)
Glucose: 89 mg/dL (ref 65–99)
POTASSIUM: 4.6 mmol/L (ref 3.5–5.2)
Sodium: 142 mmol/L (ref 134–144)
Total Protein: 7 g/dL (ref 6.0–8.5)

## 2018-04-04 ENCOUNTER — Telehealth: Payer: Self-pay | Admitting: Cardiology

## 2018-04-04 ENCOUNTER — Telehealth: Payer: Self-pay

## 2018-04-04 NOTE — Telephone Encounter (Signed)
Contacted pt regarding her Liq alert on 12/10 from 14:31 to 14:34. Pt states she was aware her heart rate was rapid at that time, but allowed it to recover on its own without taking any of her PRN Dilt or PRN Flecainide. Pt confirmed she is taking her flecainide, diltiazem, and Xarelto as prescribed. She states she will call EMS if she notes a high heart rate that does not recover after her PRN medications. She had no additional questions and understands we will contact her back if there are any changes/recommendations.

## 2018-04-04 NOTE — Telephone Encounter (Signed)
Spoke w/ pt and requested that she send a manual transmission w/ her home monitor.  

## 2018-04-04 NOTE — Telephone Encounter (Signed)
[name] reviewed symptom episode ECG, recommended no changes at this time.

## 2018-04-10 ENCOUNTER — Ambulatory Visit (INDEPENDENT_AMBULATORY_CARE_PROVIDER_SITE_OTHER): Payer: 59

## 2018-04-10 DIAGNOSIS — I480 Paroxysmal atrial fibrillation: Secondary | ICD-10-CM | POA: Diagnosis not present

## 2018-04-10 NOTE — Progress Notes (Signed)
Carelink Summary Report / Loop Recorder 

## 2018-04-15 ENCOUNTER — Telehealth: Payer: Self-pay

## 2018-04-15 NOTE — Telephone Encounter (Signed)
Pt aware of recommendations and verbalized understanding.

## 2018-04-15 NOTE — Telephone Encounter (Signed)
Per Dr Johney FrameAllred, pt may place warm compress and take tylenol to relieve the pain at her site. If redness does not subside or gets worse, pt is to call back and be scheduled with Dr Johney FrameAllred.

## 2018-04-15 NOTE — Telephone Encounter (Signed)
Pt was implanted on March 08, 2018. Pt is experiencing some tenderness and soreness around the device site. The patient said the site is a little red but do not look infected. The best number to contact the pt is [phone].

## 2018-04-16 ENCOUNTER — Other Ambulatory Visit: Payer: Self-pay | Admitting: Internal Medicine

## 2018-04-25 ENCOUNTER — Other Ambulatory Visit: Payer: Self-pay | Admitting: Internal Medicine

## 2018-05-04 LAB — CUP PACEART REMOTE DEVICE CHECK
Implantable Pulse Generator Implant Date: 20191115
MDC IDC SESS DTM: 20191218133719

## 2018-05-06 ENCOUNTER — Telehealth: Payer: Self-pay

## 2018-05-06 NOTE — Telephone Encounter (Signed)
Spoke with pt regarding symptom episode from 05/03/2018, pt stated that she just felt like she was having some AF that lasted for a few seconds informed pt that her symptom correspond to AF seen

## 2018-05-13 ENCOUNTER — Ambulatory Visit (INDEPENDENT_AMBULATORY_CARE_PROVIDER_SITE_OTHER): Payer: 59

## 2018-05-13 DIAGNOSIS — I480 Paroxysmal atrial fibrillation: Secondary | ICD-10-CM

## 2018-05-14 LAB — CUP PACEART REMOTE DEVICE CHECK
MDC IDC PG IMPLANT DT: 20191115
MDC IDC SESS DTM: 20200120133621

## 2018-05-14 NOTE — Progress Notes (Signed)
Carelink Summary Report / Loop Recorder 

## 2018-05-27 ENCOUNTER — Encounter: Payer: Self-pay | Admitting: Family

## 2018-05-27 ENCOUNTER — Ambulatory Visit: Payer: 59 | Admitting: Family

## 2018-05-27 VITALS — BP 139/89 | HR 68 | Temp 97.2°F | Ht 65.0 in | Wt 150.8 lb

## 2018-05-27 DIAGNOSIS — M2550 Pain in unspecified joint: Secondary | ICD-10-CM | POA: Diagnosis not present

## 2018-05-27 NOTE — Patient Instructions (Signed)
Gout    Gout is a condition that causes painful swelling of the joints. Gout is a type of inflammation of the joints (arthritis). This condition is caused by having too much uric acid in the body. Uric acid is a chemical that forms when the body breaks down substances called purines. Purines are important for building body proteins.  When the body has too much uric acid, sharp crystals can form and build up inside the joints. This causes pain and swelling. Gout attacks can happen quickly and may be very painful (acute gout). Over time, the attacks can affect more joints and become more frequent (chronic gout). Gout can also cause uric acid to build up under the skin and inside the kidneys.  What are the causes?  This condition is caused by too much uric acid in your blood. This can happen because:   Your kidneys do not remove enough uric acid from your blood. This is the most common cause.   Your body makes too much uric acid. This can happen with some cancers and cancer treatments. It can also occur if your body is breaking down too many red blood cells (hemolytic anemia).   You eat too many foods that are high in purines. These foods include organ meats and some seafood. Alcohol, especially beer, is also high in purines.  A gout attack may be triggered by trauma or stress.  What increases the risk?  You are more likely to develop this condition if you:   Have a family history of gout.   Are female and middle-aged.   Are female and have gone through menopause.   Are obese.   Frequently drink alcohol, especially beer.   Are dehydrated.   Lose weight too quickly.   Have an organ transplant.   Have lead poisoning.   Take certain medicines, including aspirin, cyclosporine, diuretics, levodopa, and niacin.   Have kidney disease.   Have a skin condition called psoriasis.  What are the signs or symptoms?  An attack of acute gout happens quickly. It usually occurs in just one joint. The most common place is  the big toe. Attacks often start at night. Other joints that may be affected include joints of the feet, ankle, knee, fingers, wrist, or elbow. Symptoms of this condition may include:   Severe pain.   Warmth.   Swelling.   Stiffness.   Tenderness. The affected joint may be very painful to touch.   Shiny, red, or purple skin.   Chills and fever.  Chronic gout may cause symptoms more frequently. More joints may be involved. You may also have white or yellow lumps (tophi) on your hands or feet or in other areas near your joints.  How is this diagnosed?  This condition is diagnosed based on your symptoms, medical history, and physical exam. You may have tests, such as:   Blood tests to measure uric acid levels.   Removal of joint fluid with a thin needle (aspiration) to look for uric acid crystals.   X-rays to look for joint damage.  How is this treated?  Treatment for this condition has two phases: treating an acute attack and preventing future attacks. Acute gout treatment may include medicines to reduce pain and swelling, including:   NSAIDs.   Steroids. These are strong anti-inflammatory medicines that can be taken by mouth (orally) or injected into a joint.   Colchicine. This medicine relieves pain and swelling when it is taken soon after an attack. It   can be given by mouth or through an IV.  Preventive treatment may include:   Daily use of smaller doses of NSAIDs or colchicine.   Use of a medicine that reduces uric acid levels in your blood.   Changes to your diet. You may need to see a dietitian about what to eat and drink to prevent gout.  Follow these instructions at home:  During a gout attack     If directed, put ice on the affected area:  ? Put ice in a plastic bag.  ? Place a towel between your skin and the bag.  ? Leave the ice on for 20 minutes, 2-3 times a day.   Raise (elevate) the affected joint above the level of your heart as often as possible.   Rest the joint as much as possible.  If the affected joint is in your leg, you may be given crutches to use.   Follow instructions from your health care provider about eating or drinking restrictions.  Avoiding future gout attacks   Follow a low-purine diet as told by your dietitian or health care provider. Avoid foods and drinks that are high in purines, including liver, kidney, anchovies, asparagus, herring, mushrooms, mussels, and beer.   Maintain a healthy weight or lose weight if you are overweight. If you want to lose weight, talk with your health care provider. It is important that you do not lose weight too quickly.   Start or maintain an exercise program as told by your health care provider.  Eating and drinking   Drink enough fluids to keep your urine pale yellow.   If you drink alcohol:  ? Limit how much you use to:   0-1 drink a day for women.   0-2 drinks a day for men.  ? Be aware of how much alcohol is in your drink. In the [place] one drink equals one 12 oz bottle of beer (355 mL) one 5 oz glass of wine (148 mL), or one 1 oz glass of hard liquor (44 mL).  General instructions   Take over-the-counter and prescription medicines only as told by your health care provider.   Do not drive or use heavy machinery while taking prescription pain medicine.   Return to your normal activities as told by your health care provider. Ask your health care provider what activities are safe for you.   Keep all follow-up visits as told by your health care provider. This is important.  Contact a health care provider if you have:   Another gout attack.   Continuing symptoms of a gout attack after 10 days of treatment.   Side effects from your medicines.   Chills or a fever.   Burning pain when you urinate.   Pain in your lower back or belly.  Get help right away if you:   Have severe or uncontrolled pain.   Cannot urinate.  Summary   Gout is painful swelling of the joints caused by inflammation.   The most common site of pain is the big  toe, but it can affect other joints in the body.   Medicines and dietary changes can help to prevent and treat gout attacks.  This information is not intended to replace advice given to you by your health care provider. Make sure you discuss any questions you have with your health care provider.  Document Released: 04/07/2000 Document Revised: 10/31/2017 Document Reviewed: 10/31/2017  Elsevier Interactive Patient Education  2019 [employer]

## 2018-05-27 NOTE — Progress Notes (Signed)
   Subjective:    Patient ID: Kathryn Arnold, female    DOB: 06-13-1945, 73 y.o.   MRN: [id number]  Chief Complaint  Patient presents with  . Foot Pain    HPI PT presents to the office today with left foot pain that occurred about two weeks ago. She states she was having constant 10 out 10 pain in her foot,  with redness, and warmth. She states she took Motrin and went to bed and her pain was better.   Then two days ago she noticed similar symptoms in her left knee. She states she has intermittent 7 out 10 that was worse when walking. She reports mild swelling and warmth, but no redness. She denies any injury. She states she did not take anything, but states it was better in the morning.   She worries it could be a blood clot. Pt has A Fib and is on xarelto daily.    Review of Systems  All other systems reviewed and are negative.      Objective:   Physical Exam Vitals signs reviewed.  Constitutional:      General: She is not in acute distress.    Appearance: She is well-developed.  HENT:     Head: Normocephalic and atraumatic.  Neck:     Musculoskeletal: Normal range of motion and neck supple.     Thyroid: No thyromegaly.  Cardiovascular:     Rate and Rhythm: Normal rate and regular rhythm.     Heart sounds: Normal heart sounds. No murmur.  Pulmonary:     Effort: Pulmonary effort is normal. No respiratory distress.     Breath sounds: Normal breath sounds. No wheezing.  Abdominal:     General: Bowel sounds are normal. There is no distension.     Palpations: Abdomen is soft.     Tenderness: There is no abdominal tenderness.  Musculoskeletal: Normal range of motion.        General: No tenderness.     Comments: No swelling, warmth, or tenderness noted on exam. Negative Homan's sign  Skin:    General: Skin is warm and dry.  Neurological:     Mental Status: She is alert and oriented to person, place, and time.     Cranial Nerves: No cranial nerve deficit.     Deep Tendon  Reflexes: Reflexes are normal and symmetric.  Psychiatric:        Behavior: Behavior normal.        Thought Content: Thought content normal.        Judgment: Judgment normal.       BP 139/89   Pulse 68   Temp (!) 97.2 F (36.2 C) (Oral)   Ht 5\' 5"  (1.651 m)   Wt 150 lb 12.8 oz (68.4 kg)   BMI 25.09 kg/m      Assessment & Plan:  Kathryn Arnold comes in today with chief complaint of Foot Pain   Diagnosis and orders addressed:  1. Arthralgia, unspecified joint Will do uric acid to rule out gout Encourage ROM exercises  Tylenol as needed RTO if symptoms worsen or do not improve  - Uric acid   Kathryn Rodney, FNP

## 2018-05-28 LAB — URIC ACID: Uric Acid: 4.4 mg/dL (ref 2.5–7.1)

## 2018-05-30 ENCOUNTER — Ambulatory Visit: Payer: 59 | Admitting: Family Medicine

## 2018-05-30 ENCOUNTER — Encounter: Payer: Self-pay | Admitting: Family Medicine

## 2018-05-30 VITALS — BP 143/82 | HR 69 | Temp 97.2°F | Ht 65.0 in | Wt 153.4 lb

## 2018-05-30 DIAGNOSIS — N3000 Acute cystitis without hematuria: Secondary | ICD-10-CM | POA: Diagnosis not present

## 2018-05-30 LAB — MICROSCOPIC EXAMINATION

## 2018-05-30 LAB — URINALYSIS, COMPLETE
BILIRUBIN UA: NEGATIVE
GLUCOSE, UA: NEGATIVE
Ketones, UA: NEGATIVE
Nitrite, UA: NEGATIVE
Protein, UA: NEGATIVE
Specific Gravity, UA: <1.005 — ABNORMAL LOW (ref 1.005–1.030)
Urobilinogen, Ur: 0.2 mg/dL (ref 0.2–1.0)
pH, UA: 7 (ref 5.0–7.5)

## 2018-05-30 MED ORDER — CEPHALEXIN 500 MG PO CAPS: 500.0000 mg | ORAL_CAPSULE | Freq: Four times a day (QID) | ORAL | 0 refills | Status: DC

## 2018-05-30 NOTE — Progress Notes (Signed)
   BP (!) 143/82   Pulse 69   Temp (!) 97.2 F (36.2 C) (Oral)   Ht 5\' 5"  (1.651 m)   Wt 69.6 kg   BMI 25.53 kg/m    Subjective:    Patient ID: Kathryn Arnold, female    DOB: 01/29/1946, 73 y.o.   MRN: [id number]  HPI: Kathryn Arnold is a 73 y.o. female presenting on 05/30/2018 for Dysuria (x 2 days)  Patient is coming in for pain with urination since yesterday. She is also experiencing frequency, urgency, and a bit of incontinence. She has been drinking lots of water and cranberry juice and taking Advil which has helped a little. She reports mild lower abdominal pain. Denies back pain or fever. She has a history of UTIs since she was catheterized a few years ago.  Relevant past medical, surgical, family and social history reviewed and updated as indicated. Interim medical history since our last visit reviewed. Allergies and medications reviewed and updated.  Review of Systems  Constitutional: Negative for chills and fever.  Respiratory: Negative for shortness of breath.   Cardiovascular: Negative for chest pain.  Gastrointestinal: Positive for abdominal pain. Negative for nausea and vomiting.  Genitourinary: Positive for dysuria, frequency and urgency. Negative for hematuria, vaginal discharge and vaginal pain.  Musculoskeletal: Negative for back pain.    Per HPI unless specifically indicated above      Objective:    BP (!) 143/82   Pulse 69   Temp (!) 97.2 F (36.2 C) (Oral)   Ht 5\' 5"  (1.651 m)   Wt 69.6 kg   BMI 25.53 kg/m   Wt Readings from Last 3 Encounters:  05/30/18 69.6 kg  05/27/18 68.4 kg  04/02/18 68.5 kg    Physical Exam Constitutional:      General: She is not in acute distress.    Appearance: Normal appearance.  Cardiovascular:     Rate and Rhythm: Normal rate and regular rhythm.     Heart sounds: Normal heart sounds.  Pulmonary:     Breath sounds: Normal breath sounds.  Abdominal:     General: There is no distension.     Tenderness: There is  abdominal tenderness. There is no right CVA tenderness or left CVA tenderness.   Urinalysis: Blood 2+, LEU 3+, NIT negative, >30 WBC, 3-10 RBC, 0-10 epithelial cells, few bacteria     Assessment & Plan:   Problem List Items Addressed This Visit    None    Visit Diagnoses    Acute cystitis without hematuria    -  Primary   Relevant Medications   cephALEXin (KEFLEX) 500 MG capsule   Other Relevant Orders   Urinalysis, Complete   Urine Culture       Follow up plan: Return if symptoms worsen or fail to improve.  Counseling provided for all of the vaccine components Orders Placed This Encounter  Procedures  . Urinalysis, Complete    Billey Chang Del Muerto, PA-S [facility] 05/30/2018, 11:38 AM  Patient seen and examined with PA student, agree with assessment plan above. Arville Care, MD [facility] 05/30/2018, 1:06 PM

## 2018-06-03 ENCOUNTER — Encounter: Payer: Self-pay | Admitting: Family

## 2018-06-03 ENCOUNTER — Ambulatory Visit: Payer: 59 | Admitting: Family

## 2018-06-03 VITALS — BP 137/81 | HR 51 | Temp 97.3°F | Ht 65.0 in | Wt 148.6 lb

## 2018-06-03 DIAGNOSIS — Z0000 Encounter for general adult medical examination without abnormal findings: Secondary | ICD-10-CM

## 2018-06-03 DIAGNOSIS — E785 Hyperlipidemia, unspecified: Secondary | ICD-10-CM

## 2018-06-03 DIAGNOSIS — I10 Essential (primary) hypertension: Secondary | ICD-10-CM | POA: Diagnosis not present

## 2018-06-03 DIAGNOSIS — I480 Paroxysmal atrial fibrillation: Secondary | ICD-10-CM | POA: Diagnosis not present

## 2018-06-03 DIAGNOSIS — F411 Generalized anxiety disorder: Secondary | ICD-10-CM

## 2018-06-03 DIAGNOSIS — B009 Herpesviral infection, unspecified: Secondary | ICD-10-CM

## 2018-06-03 DIAGNOSIS — Z0001 Encounter for general adult medical examination with abnormal findings: Secondary | ICD-10-CM

## 2018-06-03 DIAGNOSIS — E559 Vitamin D deficiency, unspecified: Secondary | ICD-10-CM

## 2018-06-03 DIAGNOSIS — M810 Age-related osteoporosis without current pathological fracture: Secondary | ICD-10-CM

## 2018-06-03 LAB — URINE CULTURE

## 2018-06-03 MED ORDER — RIVAROXABAN 20 MG PO TABS: 20.0000 mg | ORAL_TABLET | Freq: Every day | ORAL | 1 refills | Status: DC

## 2018-06-03 MED ORDER — VALACYCLOVIR HCL 500 MG PO TABS: ORAL_TABLET | ORAL | 1 refills | Status: DC

## 2018-06-03 MED ORDER — FLECAINIDE ACETATE 50 MG PO TABS: 50.0000 mg | ORAL_TABLET | Freq: Two times a day (BID) | ORAL | 3 refills | Status: DC

## 2018-06-03 MED ORDER — DILTIAZEM HCL ER COATED BEADS 180 MG PO CP24: 180.0000 mg | ORAL_CAPSULE | Freq: Every day | ORAL | 1 refills | Status: DC

## 2018-06-03 NOTE — Progress Notes (Signed)
Subjective:    Patient ID: Kathryn Arnold, female    DOB: [birth year], 73 y.o.   MRN: [id number]  Chief Complaint  Patient presents with  . Medical Management of Chronic Issues    six month recheck   Pt presents to the office today for CPE without pap. Pt is followed by Cardiologists for A Fib and had an ablation on 10/31/16. She had a loop recorder placed on 03/08/18.  Pt states she has not had an episode  of A Fib since 03/13/18 and is doing well.   Hypertension  This is a chronic problem. The current episode started more than 1 year ago. The problem has been resolved since onset. The problem is controlled. Associated symptoms include anxiety and malaise/fatigue. Pertinent negatives include no headaches, peripheral edema or shortness of breath. Risk factors for coronary artery disease include dyslipidemia and obesity. The current treatment provides moderate improvement. There is no history of CAD/MI or heart failure.  Hyperlipidemia  This is a chronic problem. The current episode started more than 1 year ago. The problem is controlled. Recent lipid tests were reviewed and are normal. Pertinent negatives include no shortness of breath. Current antihyperlipidemic treatment includes statins. The current treatment provides moderate improvement of lipids. Risk factors for coronary artery disease include dyslipidemia, hypertension and a sedentary lifestyle.  Constipation  This is a chronic problem. The current episode started more than 1 year ago. The problem has been waxing and waning since onset. She has tried laxatives for the symptoms. The treatment provided moderate relief.  Anxiety  Presents for follow-up visit. Symptoms include excessive worry and nervous/anxious behavior. Patient reports no shortness of breath. Symptoms occur most days. The severity of symptoms is mild.    Osteoporosis  Taking Fosamax weekly. Last Dexa scan 06/01/17.    Review of Systems  Constitutional: Positive for  malaise/fatigue.  Respiratory: Negative for shortness of breath.   Gastrointestinal: Positive for constipation.  Neurological: Negative for headaches.  Psychiatric/Behavioral: The patient is nervous/anxious.   All other systems reviewed and are negative.      Objective:   Physical Exam Vitals signs reviewed.  Constitutional:      General: She is not in acute distress.    Appearance: She is well-developed.  HENT:     Head: Normocephalic and atraumatic.     Right Ear: Tympanic membrane normal.     Left Ear: Tympanic membrane normal.  Eyes:     Pupils: Pupils are equal, round, and reactive to light.  Neck:     Musculoskeletal: Normal range of motion and neck supple.     Thyroid: No thyromegaly.  Cardiovascular:     Rate and Rhythm: Normal rate and regular rhythm.     Heart sounds: Normal heart sounds. No murmur.  Pulmonary:     Effort: Pulmonary effort is normal. No respiratory distress.     Breath sounds: Normal breath sounds. No wheezing.  Abdominal:     General: Bowel sounds are normal. There is no distension.     Palpations: Abdomen is soft.     Tenderness: There is no abdominal tenderness.  Musculoskeletal: Normal range of motion.        General: No tenderness.  Skin:    General: Skin is warm and dry.  Neurological:     Mental Status: She is alert and oriented to person, place, and time.     Cranial Nerves: No cranial nerve deficit.     Deep Tendon Reflexes: Reflexes are normal and  symmetric.  Psychiatric:        Behavior: Behavior normal.        Thought Content: Thought content normal.        Judgment: Judgment normal.       BP 137/81   Pulse (!) 51   Temp (!) 97.3 F (36.3 C) (Oral)   Ht '5\' 5"'  (1.651 m)   Wt 148 lb 9.6 oz (67.4 kg)   BMI 24.73 kg/m      Assessment & Plan:  Kathryn Arnold comes in today with chief complaint of Medical Management of Chronic Issues (six month recheck)   Diagnosis and orders addressed:  1. Annual physical exam -  CMP14+EGFR - CBC with Differential/Platelet - Lipid panel - TSH - VITAMIN D 25 Hydroxy (Vit-D Deficiency, Fractures)  2. Paroxysmal atrial fibrillation (HCC) - diltiazem (CARDIZEM CD) 180 MG 24 hr capsule; Take 1 capsule (180 mg total) by mouth daily.  Dispense: 90 capsule; Refill: 1 - flecainide (TAMBOCOR) 50 MG tablet; Take 1 tablet (50 mg total) by mouth 2 (two) times daily.  Dispense: 60 tablet; Refill: 3 - rivaroxaban (XARELTO) 20 MG TABS tablet; Take 1 tablet (20 mg total) by mouth at bedtime.  Dispense: 90 tablet; Refill: 1 - CMP14+EGFR - CBC with Differential/Platelet  3. HSV-2 (herpes simplex virus 2) infection - valACYclovir (VALTREX) 500 MG tablet; TAKE 1 TABLET BY MOUTH ONCE DAILY WITH LUNCH  Dispense: 90 tablet; Refill: 1 - CMP14+EGFR - CBC with Differential/Platelet  4. Essential hypertension, benign - CMP14+EGFR - CBC with Differential/Platelet  5. Osteoporosis, unspecified osteoporosis type, unspecified pathological fracture presence - CMP14+EGFR - CBC with Differential/Platelet  6. Hyperlipidemia with target LDL less than 100 - CMP14+EGFR - CBC with Differential/Platelet - Lipid panel  7. Vitamin D deficiency - CMP14+EGFR - CBC with Differential/Platelet - VITAMIN D 25 Hydroxy (Vit-D Deficiency, Fractures)  8. GAD (generalized anxiety disorder) - CMP14+EGFR - CBC with Differential/Platelet   Labs pending Health Maintenance reviewed Diet and exercise encouraged  Follow up plan: 1 year   Evelina Dun, FNP

## 2018-06-03 NOTE — Patient Instructions (Signed)
Health Maintenance After Age 71 After age 71, you are at a higher risk for certain long-term diseases and infections as well as injuries from falls. Falls are a major cause of broken bones and head injuries in people who are older than age 71. Getting regular preventive care can help to keep you healthy and well. Preventive care includes getting regular testing and making lifestyle changes as recommended by your health care provider. Talk with your health care provider about:  Which screenings and tests you should have. A screening is a test that checks for a disease when you have no symptoms.  A diet and exercise plan that is right for you. What should I know about screenings and tests to prevent falls? Screening and testing are the best ways to find a health problem early. Early diagnosis and treatment give you the best chance of managing medical conditions that are common after age 71. Certain conditions and lifestyle choices may make you more likely to have a fall. Your health care provider may recommend:  Regular vision checks. Poor vision and conditions such as cataracts can make you more likely to have a fall. If you wear glasses, make sure to get your prescription updated if your vision changes.  Medicine review. Work with your health care provider to regularly review all of the medicines you are taking, including over-the-counter medicines. Ask your health care provider about any side effects that may make you more likely to have a fall. Tell your health care provider if any medicines that you take make you feel dizzy or sleepy.  Osteoporosis screening. Osteoporosis is a condition that causes the bones to get weaker. This can make the bones weak and cause them to break more easily.  Blood pressure screening. Blood pressure changes and medicines to control blood pressure can make you feel dizzy.  Strength and balance checks. Your health care provider may recommend certain tests to check your  strength and balance while standing, walking, or changing positions.  Foot health exam. Foot pain and numbness, as well as not wearing proper footwear, can make you more likely to have a fall.  Depression screening. You may be more likely to have a fall if you have a fear of falling, feel emotionally low, or feel unable to do activities that you used to do.  Alcohol use screening. Using too much alcohol can affect your balance and may make you more likely to have a fall. What actions can I take to lower my risk of falls? General instructions  Talk with your health care provider about your risks for falling. Tell your health care provider if: ? You fall. Be sure to tell your health care provider about all falls, even ones that seem minor. ? You feel dizzy, sleepy, or off-balance.  Take over-the-counter and prescription medicines only as told by your health care provider. These include any supplements.  Eat a healthy diet and maintain a healthy weight. A healthy diet includes low-fat dairy products, low-fat (lean) meats, and fiber from whole grains, beans, and lots of fruits and vegetables. Home safety  Remove any tripping hazards, such as rugs, cords, and clutter.  Install safety equipment such as grab bars in bathrooms and safety rails on stairs.  Keep rooms and walkways well-lit. Activity   Follow a regular exercise program to stay fit. This will help you maintain your balance. Ask your health care provider what types of exercise are appropriate for you.  If you need a cane or   walker, use it as recommended by your health care provider.  Wear supportive shoes that have nonskid soles. Lifestyle  Do not drink alcohol if your health care provider tells you not to drink.  If you drink alcohol, limit how much you have: ? 0-1 drink a day for women. ? 0-2 drinks a day for men.  Be aware of how much alcohol is in your drink. In the [place] one drink equals one typical bottle of beer (12  oz), one-half glass of wine (5 oz), or one shot of hard liquor (1 oz).  Do not use any products that contain nicotine or tobacco, such as cigarettes and e-cigarettes. If you need help quitting, ask your health care provider. Summary  Having a healthy lifestyle and getting preventive care can help to protect your health and wellness after age 71.  Screening and testing are the best way to find a health problem early and help you avoid having a fall. Early diagnosis and treatment give you the best chance for managing medical conditions that are more common for people who are older than age 71.  Falls are a major cause of broken bones and head injuries in people who are older than age 71. Take precautions to prevent a fall at home.  Work with your health care provider to learn what changes you can make to improve your health and wellness and to prevent falls. This information is not intended to replace advice given to you by your health care provider. Make sure you discuss any questions you have with your health care provider. Document Released: 02/21/2017 Document Revised: 02/21/2017 Document Reviewed: 02/21/2017 Elsevier Interactive Patient Education  2019 Elsevier Inc.  

## 2018-06-04 LAB — CMP14+EGFR
A/G RATIO: 2.1 (ref 1.2–2.2)
ALBUMIN: 4.4 g/dL (ref 3.8–4.8)
ALK PHOS: 58 IU/L (ref 39–117)
ALT: 18 IU/L (ref 0–32)
AST: 23 IU/L (ref 0–40)
BUN / CREAT RATIO: 21 (ref 12–28)
BUN: 16 mg/dL (ref 8–27)
Bilirubin Total: 0.2 mg/dL (ref 0.0–1.2)
CO2: 24 mmol/L (ref 20–29)
CREATININE: 0.77 mg/dL (ref 0.57–1.00)
Calcium: 9.4 mg/dL (ref 8.7–10.3)
Chloride: 103 mmol/L (ref 96–106)
GFR calc Af Amer: 90 mL/min/{1.73_m2} (ref >59)
GFR, EST NON AFRICAN AMERICAN: 78 mL/min/{1.73_m2} (ref >59)
GLOBULIN, TOTAL: 2.1 g/dL (ref 1.5–4.5)
Glucose: 91 mg/dL (ref 65–99)
POTASSIUM: 4.4 mmol/L (ref 3.5–5.2)
SODIUM: 142 mmol/L (ref 134–144)
Total Protein: 6.5 g/dL (ref 6.0–8.5)

## 2018-06-04 LAB — CBC WITH DIFFERENTIAL/PLATELET
Basophils Absolute: 0.1 10*3/uL (ref 0.0–0.2)
Basos: 1 %
EOS (ABSOLUTE): 0 10*3/uL (ref 0.0–0.4)
EOS: 1 %
HEMATOCRIT: 42.5 % (ref 34.0–46.6)
HEMOGLOBIN: 14.5 g/dL (ref 11.1–15.9)
Immature Grans (Abs): 0 10*3/uL (ref 0.0–0.1)
Immature Granulocytes: 0 %
LYMPHS ABS: 1.5 10*3/uL (ref 0.7–3.1)
Lymphs: 35 %
MCH: 30.3 pg (ref 26.6–33.0)
MCHC: 34.1 g/dL (ref 31.5–35.7)
MCV: 89 fL (ref 79–97)
MONOCYTES: 8 %
Monocytes Absolute: 0.4 10*3/uL (ref 0.1–0.9)
NEUTROS ABS: 2.5 10*3/uL (ref 1.4–7.0)
Neutrophils: 55 %
Platelets: 290 10*3/uL (ref 150–450)
RBC: 4.79 x10E6/uL (ref 3.77–5.28)
RDW: 12.2 % (ref 11.7–15.4)
WBC: 4.5 10*3/uL (ref 3.4–10.8)

## 2018-06-04 LAB — VITAMIN D 25 HYDROXY (VIT D DEFICIENCY, FRACTURES): VIT D 25 HYDROXY: 34.1 ng/mL (ref 30.0–100.0)

## 2018-06-04 LAB — TSH: TSH: 1.55 u[IU]/mL (ref 0.450–4.500)

## 2018-06-04 LAB — LIPID PANEL
CHOL/HDL RATIO: 2.9 ratio (ref 0.0–4.4)
Cholesterol, Total: 150 mg/dL (ref 100–199)
HDL: 52 mg/dL (ref >39)
LDL CALC: 81 mg/dL (ref 0–99)
TRIGLYCERIDES: 86 mg/dL (ref 0–149)
VLDL Cholesterol Cal: 17 mg/dL (ref 5–40)

## 2018-06-04 NOTE — Progress Notes (Signed)
PT is aware of labs and is aware to pick up copy of form up front.

## 2018-06-06 ENCOUNTER — Encounter (HOSPITAL_COMMUNITY): Payer: Self-pay | Admitting: Nurse Practitioner

## 2018-06-06 ENCOUNTER — Ambulatory Visit (HOSPITAL_COMMUNITY)
Admission: RE | Admit: 2018-06-06 | Discharge: 2018-06-06 | Disposition: A | Discharge status: Home / Self Care | Payer: 59 | Source: Ambulatory Visit | Attending: Nurse Practitioner | Admitting: Nurse Practitioner

## 2018-06-06 VITALS — BP 142/70 | HR 49 | Ht 65.0 in | Wt 151.8 lb

## 2018-06-06 DIAGNOSIS — I10 Essential (primary) hypertension: Secondary | ICD-10-CM | POA: Insufficient documentation

## 2018-06-06 DIAGNOSIS — I480 Paroxysmal atrial fibrillation: Secondary | ICD-10-CM | POA: Insufficient documentation

## 2018-06-06 DIAGNOSIS — Z79899 Other long term (current) drug therapy: Secondary | ICD-10-CM | POA: Insufficient documentation

## 2018-06-06 DIAGNOSIS — Z7901 Long term (current) use of anticoagulants: Secondary | ICD-10-CM | POA: Diagnosis not present

## 2018-06-06 DIAGNOSIS — E785 Hyperlipidemia, unspecified: Secondary | ICD-10-CM | POA: Insufficient documentation

## 2018-06-06 DIAGNOSIS — Z8249 Family history of ischemic heart disease and other diseases of the circulatory system: Secondary | ICD-10-CM | POA: Insufficient documentation

## 2018-06-06 DIAGNOSIS — E559 Vitamin D deficiency, unspecified: Secondary | ICD-10-CM | POA: Diagnosis not present

## 2018-06-06 NOTE — Progress Notes (Signed)
Primary Care Physician: Junie SpencerHawks, Christy A, FNP Referring Physician: Dr. Tyrone SchimkeAllred   Kathryn Arnold is a 73 y.o. female with a h/o afib with 2 ablations and on flecainide that is in the afib clinic f/u Linq insertion in November 2019. 5 days after she received the Linq, she went to the ER at [facility] for afib in the 130's that did not resolve after a dose of 30 mg Cardizem. She was given Cardizem drip and shortly after,  went back to SR. It was stopped and SR continued. She was discharged on increased dose of Cardizem to 180 mg daily. She states that her afib has been very quiet since then. She feels that her episode of afib came on after eating a very large meal to celebrate her husband's birthday earlier that day. No afib noted since then.  Today, she denies symptoms of palpitations, chest pain, shortness of breath, orthopnea, PND, lower extremity edema, dizziness, presyncope, syncope, or neurologic sequela. The patient is tolerating medications without difficulties and is otherwise without complaint today.   Past Medical History:  Diagnosis Date  . Abnormal Pap smear   . Arthritis    "fingers" (10/31/2016)  . Complication of anesthesia    a little slow to wake up-stayed drowsy  . 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 HSV-2 (herpes simplex virus 2) infection   . Hypercholesteremia   . Hyperlipidemia with target LDL less than 100   . Hypertension   . Osteoporosis, unspecified    Last DEXA 10/2011   . Paroxysmal atrial fibrillation (HCC)    s/p ablation Afib and flutter 11/03/14  . PONV (postoperative nausea and vomiting)   . Vitamin D deficiency    Past Surgical History:  Procedure Laterality Date  . ATRIAL FIBRILLATION ABLATION N/A 10/31/2016   Procedure: Atrial Fibrillation Ablation;  Surgeon: Hillis RangeAllred, James, MD;  Location: [facility] INVASIVE CV LAB;  Service: Cardiovascular;  Laterality: N/A;  . BREAST LUMPECTOMY Left   . DILATION AND CURETTAGE OF UTERUS    . ELECTROPHYSIOLOGIC  STUDY N/A 11/03/2014   Procedure: Atrial Fibrillation Ablation;  Surgeon: Hillis RangeJames Allred, MD;  [facility];  Service: Cardiovascular;  Laterality: N/A;  . implantable loop recorder placement  03/08/2018   Medtronic Reveal LINQ implantation by Dr Johney FrameAllred for afib management post ablation  . LAPAROSCOPY ABDOMEN DIAGNOSTIC  1990s  . TEE WITHOUT CARDIOVERSION N/A 10/30/2016   Procedure: TRANSESOPHAGEAL ECHOCARDIOGRAM (TEE);  Surgeon: Thurmon Fairroitoru, Mihai, MD;  [facility];  Service: Cardiovascular;  Laterality: N/A;  . TUBAL LIGATION  1978    Current Outpatient Medications  Medication Sig Dispense Refill  . alendronate (FOSAMAX) 70 MG tablet Take 1 tablet (70 mg total) by mouth every 7 (seven) days. Take with a full glass of water on an empty stomach. 4 tablet 11  . Calcium Citrate-Vitamin D (CITRACAL + D PO) Take 500 mg by mouth 2 (two) times daily.    [name]. diltiazem (CARDIZEM CD) 180 MG 24 hr capsule Take 1 capsule (180 mg total) by mouth daily. 90 capsule 1  . docusate sodium (COLACE) 100 MG capsule Take 100 mg by mouth at bedtime as needed for mild constipation.    . flecainide (TAMBOCOR) 50 MG tablet Take 1 tablet (50 mg total) by mouth 2 (two) times daily. 60 tablet 3  . fluticasone (FLONASE) 50 MCG/ACT nasal spray Place 2 sprays into both nostrils daily. 16 g 6  . Magnesium 250 MG TABS Take 1 tablet by mouth every  evening.    . meclizine (ANTIVERT) 25 MG tablet Take 0.5-1 tablets (12.5-25 mg total) by mouth 3 (three) times daily as needed for dizziness. 30 tablet 0  . psyllium (METAMUCIL) 58.6 % packet Take 1 packet by mouth daily.    . rivaroxaban (XARELTO) 20 MG TABS tablet Take 1 tablet (20 mg total) by mouth at bedtime. 90 tablet 1  . rosuvastatin (CRESTOR) 20 MG tablet Take 1 tablet (20 mg total) by mouth at bedtime. 90 tablet 1  . valACYclovir (VALTREX) 500 MG tablet TAKE 1 TABLET BY MOUTH ONCE DAILY WITH LUNCH 90 tablet 1  . diltiazem (CARDIZEM) 60 MG tablet Take 1/2  to 1 tablet by mouth every 4 hours AS NEEDED for heart rate >100 as long as blood pressure >100 (Patient not taking: Reported on 06/06/2018) 30 tablet 6   No current facility-administered medications for this encounter.     No Known Allergies  Social History   Socioeconomic History  . Marital status: Married    Spouse name: Not on file  . Number of children: Not on file  . Years of education: Not on file  . Highest education level: Not on file  Occupational History  . Not on file  Social Needs  . Financial resource strain: Not on file  . Food insecurity:    Worry: Not on file    Inability: Not on file  . Transportation needs:    Medical: Not on file    Non-medical: Not on file  Tobacco Use  . Smoking status: Never Smoker  . Smokeless tobacco: Never Used  Substance and Sexual Activity  . Alcohol use: No  . Drug use: No  . Sexual activity: Not Currently    Birth control/protection: None  Lifestyle  . Physical activity:    Days per week: Not on file    Minutes per session: Not on file  . Stress: Not on file  Relationships  . Social connections:    Talks on phone: Not on file    Gets together: Not on file    Attends religious service: Not on file    Active member of club or organization: Not on file    Attends meetings of clubs or organizations: Not on file    Relationship status: Not on file  . Intimate partner violence:    Fear of current or ex partner: No    Emotionally abused: No    Physically abused: No    Forced sexual activity: No  Other Topics Concern  . Not on file  Social History Narrative   Lives in [place] with spouse.    Family History  Problem Relation Age of Onset  . Osteoporosis Mother   . Cancer Mother        breast  . Hypertension Mother   . Heart disease Father   . Other Neg Hx     ROS- All systems are reviewed and negative except as per the HPI above  Physical Exam: Vitals:   06/06/18 1101  BP: (!) 142/70  Pulse: (!) 49  Weight:  68.9 kg  Height: 5\' 5"  (1.651 m)   Wt Readings from Last 3 Encounters:  06/06/18 68.9 kg  06/03/18 67.4 kg  05/30/18 69.6 kg    Labs: Lab Results  Component Value Date   NA 142 06/03/2018   K 4.4 06/03/2018   CL 103 06/03/2018   CO2 24 06/03/2018   GLUCOSE 91 06/03/2018   BUN 16 06/03/2018   CREATININE 0.77  06/03/2018   CALCIUM 9.4 06/03/2018   PHOS 2.8 03/13/2018   MG 2.0 03/13/2018   No results found for: INR Lab Results  Component Value Date   CHOL 150 06/03/2018   HDL 52 06/03/2018   LDLCALC 81 06/03/2018   TRIG 86 06/03/2018     GEN- The patient is well appearing, alert and oriented x 3 today.   Head- normocephalic, atraumatic Eyes-  Sclera clear, conjunctiva pink Ears- hearing intact Oropharynx- clear Neck- supple, no JVP Lymph- no cervical lymphadenopathy Lungs- Clear to ausculation bilaterally, normal work of breathing Heart- Regular rate and rhythm, no murmurs, rubs or gallops, PMI not laterally displaced GI- soft, NT, ND, + BS Extremities- no clubbing, cyanosis, or edema MS- no significant deformity or atrophy Skin- no rash or lesion Psych- euthymic mood, full affect Neuro- strength and sensation are intact  EKG-sinus brady at 49 bpm, PR int 156 ms, qrs int 108 ms, qtc 415 ms IRBBB Linq reports reviewed Epic records reviewed    Assessment and Plan: 1. Paroxysmal afib Has been very quiet since increase in diltiazem 180 mg daily Continue with flecainide 50 mg bid   2. LInq Per device clinic/Dr. Allred So far has shown low afib burden   3. CHA2DS2VASc score of 3 Continue xarelto 20 mg daily No bleeding issues  4. HTN stable   F/u in 6 months  Lupita Leash [facility] [address] [place], [place] [address] [phone]

## 2018-06-11 ENCOUNTER — Telehealth: Payer: Self-pay

## 2018-06-11 NOTE — Telephone Encounter (Signed)
Spoke with pt and she agreed to send a manual transmission with my help.

## 2018-06-11 NOTE — Telephone Encounter (Signed)
Spoke with pt regarding symptom episode pt stated that she went into AF w/ RVR which correlates with ECG pt stated that she took PRN Cardizem and her daughter call EMS and by the time EMS got there she was back in NSR. Pt stated that she felt fine now.

## 2018-06-11 NOTE — Telephone Encounter (Signed)
LVM for pt to call device clinic regarding symptom episode from 06/08/2018 at 1605pm

## 2018-06-17 ENCOUNTER — Ambulatory Visit: Payer: 59 | Admitting: *Deleted

## 2018-06-18 LAB — CUP PACEART REMOTE DEVICE CHECK
Implantable Pulse Generator Implant Date: 20191115
MDC IDC SESS DTM: 20200222133916

## 2018-06-28 ENCOUNTER — Other Ambulatory Visit: Payer: Self-pay | Admitting: Family

## 2018-06-28 DIAGNOSIS — E785 Hyperlipidemia, unspecified: Secondary | ICD-10-CM

## 2018-07-18 ENCOUNTER — Ambulatory Visit (INDEPENDENT_AMBULATORY_CARE_PROVIDER_SITE_OTHER): Payer: 59 | Admitting: *Deleted

## 2018-07-18 DIAGNOSIS — I480 Paroxysmal atrial fibrillation: Secondary | ICD-10-CM | POA: Diagnosis not present

## 2018-07-20 LAB — CUP PACEART REMOTE DEVICE CHECK
Date Time Interrogation Session: 20200326140858
Implantable Pulse Generator Implant Date: 20191115

## 2018-07-22 ENCOUNTER — Other Ambulatory Visit: Payer: Self-pay

## 2018-07-23 ENCOUNTER — Other Ambulatory Visit: Payer: Self-pay | Admitting: Internal Medicine

## 2018-07-23 NOTE — Progress Notes (Signed)
Carelink Summary Report / Loop Recorder 

## 2018-08-20 ENCOUNTER — Other Ambulatory Visit: Payer: Self-pay

## 2018-08-20 ENCOUNTER — Ambulatory Visit (INDEPENDENT_AMBULATORY_CARE_PROVIDER_SITE_OTHER): Payer: 59 | Admitting: *Deleted

## 2018-08-20 DIAGNOSIS — I480 Paroxysmal atrial fibrillation: Secondary | ICD-10-CM | POA: Diagnosis not present

## 2018-08-20 DIAGNOSIS — R002 Palpitations: Secondary | ICD-10-CM

## 2018-08-20 LAB — CUP PACEART REMOTE DEVICE CHECK
Date Time Interrogation Session: 20200428144218
Implantable Pulse Generator Implant Date: 20191115

## 2018-08-28 NOTE — Progress Notes (Signed)
Carelink Summary Report / Loop Recorder 

## 2018-09-23 ENCOUNTER — Ambulatory Visit (INDEPENDENT_AMBULATORY_CARE_PROVIDER_SITE_OTHER): Payer: 59 | Admitting: *Deleted

## 2018-09-23 DIAGNOSIS — I480 Paroxysmal atrial fibrillation: Secondary | ICD-10-CM

## 2018-09-23 LAB — CUP PACEART REMOTE DEVICE CHECK
Date Time Interrogation Session: 20200531144011
Implantable Pulse Generator Implant Date: 20191115

## 2018-09-30 NOTE — Progress Notes (Signed)
Carelink Summary Report / Loop Recorder 

## 2018-10-28 ENCOUNTER — Ambulatory Visit (INDEPENDENT_AMBULATORY_CARE_PROVIDER_SITE_OTHER): Payer: 59 | Admitting: *Deleted

## 2018-10-28 DIAGNOSIS — I480 Paroxysmal atrial fibrillation: Secondary | ICD-10-CM | POA: Diagnosis not present

## 2018-10-28 LAB — CUP PACEART REMOTE DEVICE CHECK
Date Time Interrogation Session: 20200703153827
Implantable Pulse Generator Implant Date: 20191115

## 2018-11-04 NOTE — Progress Notes (Signed)
Carelink Summary Report / Loop Recorder 

## 2018-11-13 ENCOUNTER — Encounter (HOSPITAL_COMMUNITY): Payer: Self-pay | Admitting: *Deleted

## 2018-11-18 ENCOUNTER — Other Ambulatory Visit: Payer: Self-pay | Admitting: Family

## 2018-11-20 MED ORDER — ALENDRONATE SODIUM 70 MG PO TABS: ORAL_TABLET | ORAL | 6 refills | Status: DC

## 2018-11-20 NOTE — Addendum Note (Signed)
Addended by: Antonietta Barcelona D on: 11/20/2018 10:37 AM   Modules accepted: Orders

## 2018-11-20 NOTE — Telephone Encounter (Signed)
Refill failed, resent 

## 2018-11-21 ENCOUNTER — Other Ambulatory Visit: Payer: Self-pay | Admitting: Family

## 2018-11-21 DIAGNOSIS — E785 Hyperlipidemia, unspecified: Secondary | ICD-10-CM

## 2018-11-21 DIAGNOSIS — I480 Paroxysmal atrial fibrillation: Secondary | ICD-10-CM

## 2018-11-21 MED ORDER — ROSUVASTATIN CALCIUM 20 MG PO TABS: 20.0000 mg | ORAL_TABLET | Freq: Every day | ORAL | 1 refills | Status: DC

## 2018-11-21 MED ORDER — ALENDRONATE SODIUM 70 MG PO TABS: ORAL_TABLET | ORAL | 6 refills | Status: DC

## 2018-11-21 MED ORDER — FLECAINIDE ACETATE 50 MG PO TABS: 50.0000 mg | ORAL_TABLET | Freq: Two times a day (BID) | ORAL | 1 refills | Status: DC

## 2018-11-21 MED ORDER — DILTIAZEM HCL ER COATED BEADS 180 MG PO CP24: 180.0000 mg | ORAL_CAPSULE | Freq: Every day | ORAL | 1 refills | Status: DC

## 2018-11-21 NOTE — Telephone Encounter (Signed)
OV 06/03/18 rtc 1 yr LMOVM RFs sent to pharmacy

## 2018-11-27 ENCOUNTER — Ambulatory Visit (INDEPENDENT_AMBULATORY_CARE_PROVIDER_SITE_OTHER): Payer: 59 | Admitting: *Deleted

## 2018-11-27 DIAGNOSIS — I480 Paroxysmal atrial fibrillation: Secondary | ICD-10-CM

## 2018-11-27 LAB — CUP PACEART REMOTE DEVICE CHECK
Date Time Interrogation Session: 20200805135929
Implantable Pulse Generator Implant Date: 20191115

## 2018-12-03 NOTE — Progress Notes (Signed)
Carelink Summary Report / Loop Recorder 

## 2018-12-05 ENCOUNTER — Ambulatory Visit (HOSPITAL_COMMUNITY): Payer: 59 | Admitting: Physician Assistant

## 2018-12-31 ENCOUNTER — Ambulatory Visit (INDEPENDENT_AMBULATORY_CARE_PROVIDER_SITE_OTHER): Payer: 59 | Admitting: *Deleted

## 2018-12-31 DIAGNOSIS — I480 Paroxysmal atrial fibrillation: Secondary | ICD-10-CM

## 2018-12-31 LAB — CUP PACEART REMOTE DEVICE CHECK
Date Time Interrogation Session: 20200907194122
Implantable Pulse Generator Implant Date: 20191115

## 2019-01-01 ENCOUNTER — Ambulatory Visit: Payer: 59

## 2019-01-02 ENCOUNTER — Other Ambulatory Visit: Payer: Self-pay

## 2019-01-03 ENCOUNTER — Ambulatory Visit (INDEPENDENT_AMBULATORY_CARE_PROVIDER_SITE_OTHER): Payer: 59

## 2019-01-03 DIAGNOSIS — Z23 Encounter for immunization: Secondary | ICD-10-CM

## 2019-01-14 ENCOUNTER — Other Ambulatory Visit: Payer: Self-pay

## 2019-01-15 ENCOUNTER — Ambulatory Visit: Payer: 59 | Admitting: Family

## 2019-01-15 NOTE — Progress Notes (Signed)
Carelink Summary Report / Loop Recorder 

## 2019-02-03 ENCOUNTER — Ambulatory Visit (INDEPENDENT_AMBULATORY_CARE_PROVIDER_SITE_OTHER): Payer: 59 | Admitting: *Deleted

## 2019-02-03 DIAGNOSIS — I480 Paroxysmal atrial fibrillation: Secondary | ICD-10-CM

## 2019-02-03 LAB — CUP PACEART REMOTE DEVICE CHECK
Date Time Interrogation Session: 20201012160838
Implantable Pulse Generator Implant Date: 20191115

## 2019-02-12 NOTE — Progress Notes (Signed)
Carelink Summary Report / Loop Recorder 

## 2019-03-08 LAB — CUP PACEART REMOTE DEVICE CHECK
Date Time Interrogation Session: 20201114145138
Implantable Pulse Generator Implant Date: 20191115

## 2019-03-10 ENCOUNTER — Ambulatory Visit (INDEPENDENT_AMBULATORY_CARE_PROVIDER_SITE_OTHER): Payer: 59 | Admitting: *Deleted

## 2019-03-10 DIAGNOSIS — I4891 Unspecified atrial fibrillation: Secondary | ICD-10-CM | POA: Diagnosis not present

## 2019-03-26 ENCOUNTER — Other Ambulatory Visit: Payer: Self-pay | Admitting: Family

## 2019-03-26 DIAGNOSIS — B009 Herpesviral infection, unspecified: Secondary | ICD-10-CM

## 2019-04-03 ENCOUNTER — Other Ambulatory Visit: Payer: Self-pay | Admitting: Family

## 2019-04-03 DIAGNOSIS — I480 Paroxysmal atrial fibrillation: Secondary | ICD-10-CM

## 2019-04-03 NOTE — Telephone Encounter (Signed)
OV 06/03/18 rtc 1 yr OV 06/04/19

## 2019-04-03 NOTE — Progress Notes (Signed)
Carelink Summary Report / Loop Recorder 

## 2019-04-10 ENCOUNTER — Ambulatory Visit (INDEPENDENT_AMBULATORY_CARE_PROVIDER_SITE_OTHER): Payer: 59 | Admitting: *Deleted

## 2019-04-10 DIAGNOSIS — I480 Paroxysmal atrial fibrillation: Secondary | ICD-10-CM | POA: Diagnosis not present

## 2019-04-10 LAB — CUP PACEART REMOTE DEVICE CHECK
Date Time Interrogation Session: 20201217121729
Implantable Pulse Generator Implant Date: 20191115

## 2019-05-10 NOTE — Progress Notes (Signed)
ILR remote 

## 2019-05-13 ENCOUNTER — Ambulatory Visit (INDEPENDENT_AMBULATORY_CARE_PROVIDER_SITE_OTHER): Payer: 59 | Admitting: *Deleted

## 2019-05-13 DIAGNOSIS — I480 Paroxysmal atrial fibrillation: Secondary | ICD-10-CM

## 2019-05-13 LAB — CUP PACEART REMOTE DEVICE CHECK
Date Time Interrogation Session: 20210119123430
Implantable Pulse Generator Implant Date: 20191115

## 2019-05-28 ENCOUNTER — Other Ambulatory Visit: Payer: Self-pay | Admitting: Family

## 2019-05-28 DIAGNOSIS — I480 Paroxysmal atrial fibrillation: Secondary | ICD-10-CM

## 2019-05-29 ENCOUNTER — Telehealth: Payer: Self-pay | Admitting: Family

## 2019-05-29 NOTE — Telephone Encounter (Signed)
Waiting for provider to call back

## 2019-06-04 ENCOUNTER — Encounter: Payer: 59 | Admitting: Family

## 2019-06-05 ENCOUNTER — Other Ambulatory Visit: Payer: Self-pay

## 2019-06-06 ENCOUNTER — Ambulatory Visit (INDEPENDENT_AMBULATORY_CARE_PROVIDER_SITE_OTHER): Payer: 59

## 2019-06-06 ENCOUNTER — Encounter: Payer: Self-pay | Admitting: Family

## 2019-06-06 ENCOUNTER — Ambulatory Visit (INDEPENDENT_AMBULATORY_CARE_PROVIDER_SITE_OTHER): Payer: 59 | Admitting: Family

## 2019-06-06 VITALS — BP 130/67 | HR 49 | Temp 96.8°F | Ht 65.0 in | Wt 153.2 lb

## 2019-06-06 DIAGNOSIS — M810 Age-related osteoporosis without current pathological fracture: Secondary | ICD-10-CM

## 2019-06-06 DIAGNOSIS — E785 Hyperlipidemia, unspecified: Secondary | ICD-10-CM

## 2019-06-06 DIAGNOSIS — F411 Generalized anxiety disorder: Secondary | ICD-10-CM

## 2019-06-06 DIAGNOSIS — Z0000 Encounter for general adult medical examination without abnormal findings: Secondary | ICD-10-CM

## 2019-06-06 DIAGNOSIS — I480 Paroxysmal atrial fibrillation: Secondary | ICD-10-CM | POA: Diagnosis not present

## 2019-06-06 DIAGNOSIS — E559 Vitamin D deficiency, unspecified: Secondary | ICD-10-CM

## 2019-06-06 DIAGNOSIS — I10 Essential (primary) hypertension: Secondary | ICD-10-CM

## 2019-06-06 DIAGNOSIS — B009 Herpesviral infection, unspecified: Secondary | ICD-10-CM

## 2019-06-06 DIAGNOSIS — M1990 Unspecified osteoarthritis, unspecified site: Secondary | ICD-10-CM

## 2019-06-06 MED ORDER — ROSUVASTATIN CALCIUM 20 MG PO TABS: 20.0000 mg | ORAL_TABLET | Freq: Every day | ORAL | 1 refills | Status: DC

## 2019-06-06 MED ORDER — FLECAINIDE ACETATE 50 MG PO TABS: 50.0000 mg | ORAL_TABLET | Freq: Two times a day (BID) | ORAL | 0 refills | Status: DC

## 2019-06-06 MED ORDER — DILTIAZEM HCL 60 MG PO TABS: ORAL_TABLET | ORAL | 6 refills | Status: DC

## 2019-06-06 MED ORDER — RIVAROXABAN 20 MG PO TABS: 20.0000 mg | ORAL_TABLET | Freq: Every day | ORAL | 2 refills | Status: DC

## 2019-06-06 MED ORDER — DILTIAZEM HCL ER COATED BEADS 180 MG PO CP24: 180.0000 mg | ORAL_CAPSULE | Freq: Every day | ORAL | 1 refills | Status: DC

## 2019-06-06 MED ORDER — ALENDRONATE SODIUM 70 MG PO TABS: ORAL_TABLET | ORAL | 6 refills | Status: DC

## 2019-06-06 MED ORDER — VALACYCLOVIR HCL 500 MG PO TABS: 500.0000 mg | ORAL_TABLET | Freq: Every day | ORAL | 0 refills | Status: DC

## 2019-06-06 NOTE — Patient Instructions (Signed)
Health Maintenance After Age 72 After age 72, you are at a higher risk for certain long-term diseases and infections as well as injuries from falls. Falls are a major cause of broken bones and head injuries in people who are older than age 72. Getting regular preventive care can help to keep you healthy and well. Preventive care includes getting regular testing and making lifestyle changes as recommended by your health care provider. Talk with your health care provider about:  Which screenings and tests you should have. A screening is a test that checks for a disease when you have no symptoms.  A diet and exercise plan that is right for you. What should I know about screenings and tests to prevent falls? Screening and testing are the best ways to find a health problem early. Early diagnosis and treatment give you the best chance of managing medical conditions that are common after age 72. Certain conditions and lifestyle choices may make you more likely to have a fall. Your health care provider may recommend:  Regular vision checks. Poor vision and conditions such as cataracts can make you more likely to have a fall. If you wear glasses, make sure to get your prescription updated if your vision changes.  Medicine review. Work with your health care provider to regularly review all of the medicines you are taking, including over-the-counter medicines. Ask your health care provider about any side effects that may make you more likely to have a fall. Tell your health care provider if any medicines that you take make you feel dizzy or sleepy.  Osteoporosis screening. Osteoporosis is a condition that causes the bones to get weaker. This can make the bones weak and cause them to break more easily.  Blood pressure screening. Blood pressure changes and medicines to control blood pressure can make you feel dizzy.  Strength and balance checks. Your health care provider may recommend certain tests to check your  strength and balance while standing, walking, or changing positions.  Foot health exam. Foot pain and numbness, as well as not wearing proper footwear, can make you more likely to have a fall.  Depression screening. You may be more likely to have a fall if you have a fear of falling, feel emotionally low, or feel unable to do activities that you used to do.  Alcohol use screening. Using too much alcohol can affect your balance and may make you more likely to have a fall. What actions can I take to lower my risk of falls? General instructions  Talk with your health care provider about your risks for falling. Tell your health care provider if: ? You fall. Be sure to tell your health care provider about all falls, even ones that seem minor. ? You feel dizzy, sleepy, or off-balance.  Take over-the-counter and prescription medicines only as told by your health care provider. These include any supplements.  Eat a healthy diet and maintain a healthy weight. A healthy diet includes low-fat dairy products, low-fat (lean) meats, and fiber from whole grains, beans, and lots of fruits and vegetables. Home safety  Remove any tripping hazards, such as rugs, cords, and clutter.  Install safety equipment such as grab bars in bathrooms and safety rails on stairs.  Keep rooms and walkways well-lit. Activity   Follow a regular exercise program to stay fit. This will help you maintain your balance. Ask your health care provider what types of exercise are appropriate for you.  If you need a cane or   walker, use it as recommended by your health care provider.  Wear supportive shoes that have nonskid soles. Lifestyle  Do not drink alcohol if your health care provider tells you not to drink.  If you drink alcohol, limit how much you have: ? 0-1 drink a day for women. ? 0-2 drinks a day for men.  Be aware of how much alcohol is in your drink. In the [place] one drink equals one typical bottle of beer (12  oz), one-half glass of wine (5 oz), or one shot of hard liquor (1 oz).  Do not use any products that contain nicotine or tobacco, such as cigarettes and e-cigarettes. If you need help quitting, ask your health care provider. Summary  Having a healthy lifestyle and getting preventive care can help to protect your health and wellness after age 72.  Screening and testing are the best way to find a health problem early and help you avoid having a fall. Early diagnosis and treatment give you the best chance for managing medical conditions that are more common for people who are older than age 72.  Falls are a major cause of broken bones and head injuries in people who are older than age 72. Take precautions to prevent a fall at home.  Work with your health care provider to learn what changes you can make to improve your health and wellness and to prevent falls. This information is not intended to replace advice given to you by your health care provider. Make sure you discuss any questions you have with your health care provider. Document Revised: 08/01/2018 Document Reviewed: 02/21/2017 Elsevier Patient Education  2020 Elsevier Inc.  

## 2019-06-06 NOTE — Progress Notes (Signed)
Subjective:    Patient ID: Kathryn Arnold, female    DOB: Dec 11, 1945, 74 y.o.   MRN: [id number]  Chief Complaint  Patient presents with  . Annual Exam   Pt presents to the office today for CPE without pap. Pt is followed by Cardiologists for A Fib and had an ablation on 10/31/16. She had a loop recorder placed on 03/08/18. She takes Xarelto 20 mg daily.  She reports she has not seen her Cardiologists in over a year now.  Hypertension This is a chronic problem. The current episode started more than 1 year ago. The problem has been waxing and waning since onset. The problem is uncontrolled. Associated symptoms include anxiety and malaise/fatigue. Pertinent negatives include no peripheral edema or shortness of breath. Risk factors for coronary artery disease include sedentary lifestyle, dyslipidemia, stress and post-menopausal state. Past treatments include calcium channel blockers. The current treatment provides moderate improvement. There is no history of kidney disease or heart failure.  Arthritis Presents for follow-up visit. She complains of stiffness. She reports no pain. The symptoms have been stable. Affected locations include the right MCP and left MCP. Her pain is at a severity of 1/10.  Hyperlipidemia This is a chronic problem. The current episode started more than 1 year ago. The problem is controlled. Recent lipid tests were reviewed and are normal. Pertinent negatives include no shortness of breath. Current antihyperlipidemic treatment includes statins. The current treatment provides moderate improvement of lipids. Risk factors for coronary artery disease include dyslipidemia and hypertension.  Anxiety Presents for follow-up visit. Symptoms include depressed mood, excessive worry, irritability and nervous/anxious behavior. Patient reports no shortness of breath. Symptoms occur most days.    Osteoporosis Pt takes Fosamax weekly. Last dexa scan was 06/01/17.  Herpes Simplex Virus    Pt takes Valtrex daily.    Review of Systems  Constitutional: Positive for irritability and malaise/fatigue.  Respiratory: Negative for shortness of breath.   Musculoskeletal: Positive for arthritis and stiffness.  Psychiatric/Behavioral: The patient is nervous/anxious.   All other systems reviewed and are negative.  Family History  Problem Relation Age of Onset  . Osteoporosis Mother   . Cancer Mother        breast  . Hypertension Mother   . Heart disease Father   . Other Neg Hx    Social History   Socioeconomic History  . Marital status: Married    Spouse name: Not on file  . Number of children: Not on file  . Years of education: Not on file  . Highest education level: Not on file  Occupational History  . Not on file  Tobacco Use  . Smoking status: Never Smoker  . Smokeless tobacco: Never Used  Substance and Sexual Activity  . Alcohol use: No  . Drug use: No  . Sexual activity: Not Currently    Birth control/protection: None  Other Topics Concern  . Not on file  Social History Narrative   Lives in [place] with spouse.   Social Determinants of Health   Financial Resource Strain:   . Difficulty of Paying Living Expenses: Not on file  Food Insecurity:   . Worried About Charity fundraiser in the Last Year: Not on file  . Ran Out of Food in the Last Year: Not on file  Transportation Needs:   . Lack of Transportation (Medical): Not on file  . Lack of Transportation (Non-Medical): Not on file  Physical Activity:   . Days of Exercise per Week:  Not on file  . Minutes of Exercise per Session: Not on file  Stress:   . Feeling of Stress : Not on file  Social Connections:   . Frequency of Communication with Friends and Family: Not on file  . Frequency of Social Gatherings with Friends and Family: Not on file  . Attends Religious Services: Not on file  . Active Member of Clubs or Organizations: Not on file  . Attends Archivist Meetings: Not on file  .  Marital Status: Not on file        Objective:   Physical Exam Vitals reviewed.  Constitutional:      General: She is not in acute distress.    Appearance: She is well-developed.  HENT:     Head: Normocephalic and atraumatic.     Right Ear: Tympanic membrane normal.     Left Ear: Tympanic membrane normal.  Eyes:     Pupils: Pupils are equal, round, and reactive to light.  Neck:     Thyroid: No thyromegaly.  Cardiovascular:     Rate and Rhythm: Normal rate and regular rhythm.     Heart sounds: Normal heart sounds. No murmur.  Pulmonary:     Effort: Pulmonary effort is normal. No respiratory distress.     Breath sounds: Normal breath sounds. No wheezing.  Abdominal:     General: Bowel sounds are normal. There is no distension.     Palpations: Abdomen is soft.     Tenderness: There is no abdominal tenderness.  Musculoskeletal:        General: No tenderness. Normal range of motion.     Cervical back: Normal range of motion and neck supple.  Skin:    General: Skin is warm and dry.  Neurological:     Mental Status: She is alert and oriented to person, place, and time.     Cranial Nerves: No cranial nerve deficit.     Deep Tendon Reflexes: Reflexes are normal and symmetric.  Psychiatric:        Behavior: Behavior normal.        Thought Content: Thought content normal.        Judgment: Judgment normal.     BP 130/67   Pulse (!) 49   Temp (!) 96.8 F (36 C) (Temporal)   Ht '5\' 5"'  (1.651 m)   Wt 153 lb 3.2 oz (69.5 kg)   SpO2 97%   BMI 25.49 kg/m      Assessment & Plan:  IVORI STORR comes in today with chief complaint of Annual Exam   Diagnosis and orders addressed:  1. Hyperlipidemia with target LDL less than 100 - rosuvastatin (CRESTOR) 20 MG tablet; Take 1 tablet (20 mg total) by mouth at bedtime.  Dispense: 90 tablet; Refill: 1 - CMP14+EGFR - CBC with Differential/Platelet  2. Paroxysmal atrial fibrillation (HCC) - diltiazem (CARDIZEM CD) 180 MG 24 hr  capsule; Take 1 capsule (180 mg total) by mouth daily.  Dispense: 90 capsule; Refill: 1 - flecainide (TAMBOCOR) 50 MG tablet; Take 1 tablet (50 mg total) by mouth 2 (two) times daily.  Dispense: 180 tablet; Refill: 0 - rivaroxaban (XARELTO) 20 MG TABS tablet; Take 1 tablet (20 mg total) by mouth at bedtime.  Dispense: 90 tablet; Refill: 2 - diltiazem (CARDIZEM) 60 MG tablet; Take 1/2 to 1 tablet by mouth every 4 hours AS NEEDED for heart rate >100 as long as blood pressure >100  Dispense: 30 tablet; Refill: 6 - CMP14+EGFR - CBC  with Differential/Platelet  3. HSV-2 (herpes simplex virus 2) infection - valACYclovir (VALTREX) 500 MG tablet; Take 1 tablet (500 mg total) by mouth daily.  Dispense: 90 tablet; Refill: 0 - CMP14+EGFR - CBC with Differential/Platelet  4. Essential hypertension, benign - CMP14+EGFR - CBC with Differential/Platelet  5. Arthritis - CMP14+EGFR - CBC with Differential/Platelet  6. Osteoporosis, unspecified osteoporosis type, unspecified pathological fracture presence - alendronate (FOSAMAX) 70 MG tablet; TAKE 1 TABLET BY MOUTH ONCE A WEEK TAKE  WITH  A  FULL  GLASS  OF  WATER  ON  AN  EMPTY  STOMACH  Dispense: 4 tablet; Refill: 6 - CMP14+EGFR - CBC with Differential/Platelet -Dexa scan   7. GAD (generalized anxiety disorder) - CMP14+EGFR - CBC with Differential/Platelet  8. Vitamin D deficiency - CMP14+EGFR - CBC with Differential/Platelet - VITAMIN D 25 Hydroxy (Vit-D Deficiency, Fractures)  9. Annual physical exam - CMP14+EGFR - CBC with Differential/Platelet - Lipid panel - TSH - VITAMIN D 25 Hydroxy (Vit-D Deficiency, Fractures)   Labs pending Health Maintenance reviewed Diet and exercise encouraged  Follow up plan: 6 months   Evelina Dun, FNP

## 2019-06-07 LAB — CBC WITH DIFFERENTIAL/PLATELET
Basophils Absolute: 0 10*3/uL (ref 0.0–0.2)
Basos: 1 %
EOS (ABSOLUTE): 0 10*3/uL (ref 0.0–0.4)
Eos: 1 %
Hematocrit: 41.9 % (ref 34.0–46.6)
Hemoglobin: 14.4 g/dL (ref 11.1–15.9)
Immature Grans (Abs): 0 10*3/uL (ref 0.0–0.1)
Immature Granulocytes: 0 %
Lymphocytes Absolute: 1.4 10*3/uL (ref 0.7–3.1)
Lymphs: 33 %
MCH: 30.3 pg (ref 26.6–33.0)
MCHC: 34.4 g/dL (ref 31.5–35.7)
MCV: 88 fL (ref 79–97)
Monocytes Absolute: 0.4 10*3/uL (ref 0.1–0.9)
Monocytes: 9 %
Neutrophils Absolute: 2.4 10*3/uL (ref 1.4–7.0)
Neutrophils: 56 %
Platelets: 250 10*3/uL (ref 150–450)
RBC: 4.75 x10E6/uL (ref 3.77–5.28)
RDW: 12.2 % (ref 11.7–15.4)
WBC: 4.3 10*3/uL (ref 3.4–10.8)

## 2019-06-07 LAB — CMP14+EGFR
ALT: 19 IU/L (ref 0–32)
AST: 22 IU/L (ref 0–40)
Albumin/Globulin Ratio: 2.1 (ref 1.2–2.2)
Albumin: 4.6 g/dL (ref 3.7–4.7)
Alkaline Phosphatase: 47 IU/L (ref 39–117)
BUN/Creatinine Ratio: 19 (ref 12–28)
BUN: 15 mg/dL (ref 8–27)
Bilirubin Total: 0.4 mg/dL (ref 0.0–1.2)
CO2: 27 mmol/L (ref 20–29)
Calcium: 9.3 mg/dL (ref 8.7–10.3)
Chloride: 104 mmol/L (ref 96–106)
Creatinine, Ser: 0.78 mg/dL (ref 0.57–1.00)
GFR calc Af Amer: 88 mL/min/{1.73_m2} (ref >59)
GFR calc non Af Amer: 77 mL/min/{1.73_m2} (ref >59)
Globulin, Total: 2.2 g/dL (ref 1.5–4.5)
Glucose: 94 mg/dL (ref 65–99)
Potassium: 4.9 mmol/L (ref 3.5–5.2)
Sodium: 141 mmol/L (ref 134–144)
Total Protein: 6.8 g/dL (ref 6.0–8.5)

## 2019-06-07 LAB — LIPID PANEL
Chol/HDL Ratio: 2.9 ratio (ref 0.0–4.4)
Cholesterol, Total: 162 mg/dL (ref 100–199)
HDL: 55 mg/dL (ref >39)
LDL Chol Calc (NIH): 89 mg/dL (ref 0–99)
Triglycerides: 99 mg/dL (ref 0–149)
VLDL Cholesterol Cal: 18 mg/dL (ref 5–40)

## 2019-06-07 LAB — TSH: TSH: 1.76 u[IU]/mL (ref 0.450–4.500)

## 2019-06-07 LAB — VITAMIN D 25 HYDROXY (VIT D DEFICIENCY, FRACTURES): Vit D, 25-Hydroxy: 35.1 ng/mL (ref 30.0–100.0)

## 2019-06-09 ENCOUNTER — Other Ambulatory Visit: Payer: Self-pay | Admitting: Family

## 2019-06-09 DIAGNOSIS — I480 Paroxysmal atrial fibrillation: Secondary | ICD-10-CM

## 2019-06-16 ENCOUNTER — Other Ambulatory Visit: Payer: Self-pay | Admitting: Family

## 2019-06-16 ENCOUNTER — Telehealth: Payer: Self-pay | Admitting: Family

## 2019-06-16 ENCOUNTER — Ambulatory Visit (INDEPENDENT_AMBULATORY_CARE_PROVIDER_SITE_OTHER): Payer: 59 | Admitting: *Deleted

## 2019-06-16 DIAGNOSIS — I480 Paroxysmal atrial fibrillation: Secondary | ICD-10-CM | POA: Diagnosis not present

## 2019-06-16 LAB — CUP PACEART REMOTE DEVICE CHECK
Date Time Interrogation Session: 20210222003741
Implantable Pulse Generator Implant Date: 20191115

## 2019-06-16 NOTE — Progress Notes (Signed)
ILR Remote 

## 2019-06-16 NOTE — Telephone Encounter (Signed)
Pt aware.

## 2019-06-20 ENCOUNTER — Ambulatory Visit: Payer: 59 | Admitting: Family

## 2019-07-17 ENCOUNTER — Ambulatory Visit (INDEPENDENT_AMBULATORY_CARE_PROVIDER_SITE_OTHER): Payer: 59 | Admitting: *Deleted

## 2019-07-17 DIAGNOSIS — I480 Paroxysmal atrial fibrillation: Secondary | ICD-10-CM

## 2019-07-17 LAB — CUP PACEART REMOTE DEVICE CHECK
Date Time Interrogation Session: 20210325013844
Implantable Pulse Generator Implant Date: 20191115

## 2019-07-17 NOTE — Progress Notes (Signed)
ILR Remote 

## 2019-08-11 ENCOUNTER — Telehealth: Payer: Self-pay | Admitting: Internal Medicine

## 2019-08-11 NOTE — Telephone Encounter (Signed)
New message   Patient wants to discuss having the loop recorder removed. Please call.

## 2019-08-13 NOTE — Telephone Encounter (Signed)
Returned call to Pt.  Per Pt she would like to have her loop recorder removed.  She states she has continued to have some itching over the site from time to time and sometimes when she turns over in bed it feels like the device is trying to "stand up" in her chest which is painful.  Pt understands loop recorder batteries last 3 years, but she feels she has tolerated it as long as she can and would like it removed.  Will send to scheduler to set up next available time for loop removal.  Pt thanked nurse for assistance.

## 2019-08-18 LAB — CUP PACEART REMOTE DEVICE CHECK
Date Time Interrogation Session: 20210425013847
Implantable Pulse Generator Implant Date: 20191115

## 2019-08-19 ENCOUNTER — Ambulatory Visit (INDEPENDENT_AMBULATORY_CARE_PROVIDER_SITE_OTHER): Payer: 59 | Admitting: *Deleted

## 2019-08-19 DIAGNOSIS — I480 Paroxysmal atrial fibrillation: Secondary | ICD-10-CM | POA: Diagnosis not present

## 2019-08-20 NOTE — Progress Notes (Signed)
ILR Remote 

## 2019-09-17 ENCOUNTER — Encounter: Payer: Self-pay | Admitting: Internal Medicine

## 2019-09-17 ENCOUNTER — Ambulatory Visit (INDEPENDENT_AMBULATORY_CARE_PROVIDER_SITE_OTHER): Payer: 59 | Admitting: Internal Medicine

## 2019-09-17 ENCOUNTER — Other Ambulatory Visit: Payer: Self-pay

## 2019-09-17 ENCOUNTER — Ambulatory Visit (INDEPENDENT_AMBULATORY_CARE_PROVIDER_SITE_OTHER): Payer: 59 | Admitting: *Deleted

## 2019-09-17 VITALS — BP 152/82 | HR 55 | Ht 65.0 in | Wt 158.0 lb

## 2019-09-17 DIAGNOSIS — I4819 Other persistent atrial fibrillation: Secondary | ICD-10-CM

## 2019-09-17 DIAGNOSIS — E782 Mixed hyperlipidemia: Secondary | ICD-10-CM | POA: Diagnosis not present

## 2019-09-17 DIAGNOSIS — I10 Essential (primary) hypertension: Secondary | ICD-10-CM | POA: Diagnosis not present

## 2019-09-17 DIAGNOSIS — D6869 Other thrombophilia: Secondary | ICD-10-CM

## 2019-09-17 DIAGNOSIS — I480 Paroxysmal atrial fibrillation: Secondary | ICD-10-CM

## 2019-09-17 HISTORY — PX: LOOP RECORDER REMOVAL: EP1215

## 2019-09-17 LAB — CUP PACEART REMOTE DEVICE CHECK
Date Time Interrogation Session: 20210526014722
Date Time Interrogation Session: 20210526014722
Implantable Pulse Generator Implant Date: 20191115
Implantable Pulse Generator Implant Date: 20191115

## 2019-09-17 NOTE — Patient Instructions (Addendum)
Medication Instructions:  Your physician recommends that you continue on your current medications as directed. Please refer to the Current Medication list given to you today.  Labwork: None ordered.  Testing/Procedures: None ordered.  Follow-Up:  Your physician wants you to follow-up in: one year with Rudi Coco at the afib clinic.   You will receive a reminder letter in the mail two months in advance. If you don't receive a letter, please call our office to schedule the follow-up appointment.    Implantable Loop Recorder Placement, Care After Refer to this sheet in the next few weeks. These instructions provide you with information about caring for yourself after your procedure. Your health care provider may also give you more specific instructions. Your treatment has been planned according to current medical practices, but problems sometimes occur. Call your health care provider if you have any problems or questions after your procedure. What can I expect after the procedure? After the procedure, it is common to have:  Soreness or pain near the cut from surgery (incision).  Some swelling or bruising near the incision.  Follow these instructions at home:  Medicines  Take over-the-counter and prescription medicines only as told by your health care provider.  If you were prescribed an antibiotic medicine, take it as told by your health care provider. Do not stop taking the antibiotic even if you start to feel better.  Bathing Do not take baths, swim, or use a hot tub until your health care provider approves. You may shower 24 hours after removal of your monitor.  Incision care  Follow instructions from your health care provider about how to take care of your incision. Make sure you: ? Remove your top dressing after 24 hours (before you shower) ? Leave stitches (sutures), skin glue, or adhesive strips in place. These skin closures may need to stay in place for 2 weeks or  longer. If adhesive strip edges start to loosen and curl up, you may trim the loose edges. Do not remove adhesive strips completely unless your health care provider tells you to do that.  Check your incision area every day for signs of infection. Check for: ? More redness, swelling, or pain. ? Fluid or blood. ? Warmth. ? Pus or a bad smell.  Contact a health care provider if:  You have more redness, swelling, or pain around your incision.  You have more fluid or blood coming from your incision.  Your incision feels warm to the touch.  You have pus or a bad smell coming from your incision.  You have a fever.  You have pain that is not relieved by your pain medicine.  You have triggered your device because of fainting (syncope) or because of a heartbeat that feels like it is racing, slow, fluttering, or skipping (palpitations).

## 2019-09-17 NOTE — Progress Notes (Signed)
PCP: Junie Spencer, FNP   Primary EP: Dr Johney Frame  Kathryn Arnold is a 74 y.o. female who presents today for routine electrophysiology followup.  Since last being seen in our clinic, the patient reports doing very well.  Today, she denies symptoms of palpitations, chest pain, shortness of breath,  lower extremity edema, dizziness, presyncope, or syncope.  The patient is otherwise without complaint today.   Past Medical History:  Diagnosis Date  . Abnormal Pap smear   . Arthritis    "fingers" (10/31/2016)  . Complication of anesthesia    a little slow to wake up-stayed drowsy  . 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 HSV-2 (herpes simplex virus 2) infection   . Hypercholesteremia   . Hyperlipidemia with target LDL less than 100   . Hypertension   . Osteoporosis, unspecified    Last DEXA 10/2011   . Paroxysmal atrial fibrillation (HCC)    s/p ablation Afib and flutter 11/03/14  . PONV (postoperative nausea and vomiting)   . Vitamin D deficiency    Past Surgical History:  Procedure Laterality Date  . ATRIAL FIBRILLATION ABLATION N/A 10/31/2016   Procedure: Atrial Fibrillation Ablation;  Surgeon: Hillis Range, MD;  Location: [facility] INVASIVE CV LAB;  Service: Cardiovascular;  Laterality: N/A;  . BREAST LUMPECTOMY Left   . DILATION AND CURETTAGE OF UTERUS    . ELECTROPHYSIOLOGIC STUDY N/A 11/03/2014   Procedure: Atrial Fibrillation Ablation;  Surgeon: Hillis Range, MD;  Location: [facility] INVASIVE CV LAB;  Service: Cardiovascular;  Laterality: N/A;  . implantable loop recorder placement  03/08/2018   Medtronic Reveal LINQ implantation by Dr Johney Frame for afib management post ablation  . LAPAROSCOPY ABDOMEN DIAGNOSTIC  1990s  . TEE WITHOUT CARDIOVERSION N/A 10/30/2016   Procedure: TRANSESOPHAGEAL ECHOCARDIOGRAM (TEE);  Surgeon: Thurmon Fair, MD;  Location: [facility] ENDOSCOPY;  Service: Cardiovascular;  Laterality: N/A;  . TUBAL LIGATION  1978    ROS- all systems are reviewed and negatives  except as per HPI above  Current Outpatient Medications  Medication Sig Dispense Refill  . alendronate (FOSAMAX) 70 MG tablet TAKE 1 TABLET BY MOUTH ONCE A WEEK TAKE  WITH  A  FULL  GLASS  OF  WATER  ON  AN  EMPTY  STOMACH 4 tablet 6  . Calcium Citrate-Vitamin D (CITRACAL + D PO) Take 500 mg by mouth 2 (two) times daily.    [name] diltiazem (CARDIZEM CD) 180 MG 24 hr capsule Take 1 capsule (180 mg total) by mouth daily. 90 capsule 1  . diltiazem (CARDIZEM) 60 MG tablet TAKE 1/2 (ONE-HALF) TO 1 TABLET EVERY 4 HOURS AS NEEDED FOR HEART RATE GREATER THAN 100 AS LONG AS BLOOD PRESSURE IS GREATER THAN 100 30 tablet 2  . flecainide (TAMBOCOR) 50 MG tablet Take 1 tablet (50 mg total) by mouth 2 (two) times daily. 180 tablet 0  . Magnesium 250 MG TABS Take 1 tablet by mouth every evening.    . rivaroxaban (XARELTO) 20 MG TABS tablet Take 1 tablet (20 mg total) by mouth at bedtime. 90 tablet 2  . rosuvastatin (CRESTOR) 20 MG tablet Take 1 tablet (20 mg total) by mouth at bedtime. 90 tablet 1  . valACYclovir (VALTREX) 500 MG tablet Take 1 tablet (500 mg total) by mouth daily. 90 tablet 0   No current facility-administered medications for this visit.    Physical Exam: Vitals:   09/17/19 0957  BP: (!) 152/82  Pulse: (!) 55  SpO2: 95%  Weight: 158  lb (71.7 kg)  Height: 5\' 5"  (1.651 m)    GEN- The patient is well appearing, alert and oriented x 3 today.   Head- normocephalic, atraumatic Eyes-  Sclera clear, conjunctiva pink Ears- hearing intact Oropharynx- clear Lungs-   normal work of breathing Heart- Regular rate and rhythm  GI- soft, NT, ND, + BS Extremities- no clubbing, cyanosis, or edema Skin- ILR site is well healed  Wt Readings from Last 3 Encounters:  09/17/19 158 lb (71.7 kg)  06/06/19 153 lb 3.2 oz (69.5 kg)  06/06/18 151 lb 12.8 oz (68.9 kg)    EKG tracing ordered today is personally reviewed and shows sinus bradycardia 55 bpm  Assessment and Plan:  1. Paroxysmal afib Burden  <0.15 by ILR Well controlled She wishes to have her ILR removed due to discomfort at the site She is on xarelto for stroke prevention (chads2vasc score is 3) Continue flecainide  2. HL Continue crestor  3. HTN Stable No change required today  Follow-up in AF clinic in a year  Risks, benefits and potential toxicities for medications prescribed and/or refilled reviewed with patient today.   Thompson Grayer MD, [facility] 09/17/2019 10:42 AM      ILR removal:  Informed written consent was obtained.  The patient required no sedation for the procedure today.   The patients left chest was therefore prepped and draped in the usual sterile fashion.  The skin overlying the ILR monitor was infiltrated with lidocaine for local analgesia.  A 0.5-cm incision was made over the site.  The previously implanted ILR was exposed and removed using a combination of sharp and blunt dissection.  Steri- Strips and a sterile dressing were then applied. EBL<41ml.  There were no early apparent complications.     CONCLUSIONS:   1. Successful explantation of a Medtronic Reveal LINQ implantable loop recorder   2. No early apparent complications.        Thompson Grayer MD, [facility] 09/17/2019 10:44 AM

## 2019-09-17 NOTE — Progress Notes (Signed)
Carelink Summary Report / Loop Recorder 

## 2019-09-18 NOTE — Addendum Note (Signed)
Addended by: Geralyn Flash D on: 09/18/2019 04:08 PM   Modules accepted: Level of Service

## 2019-09-26 ENCOUNTER — Telehealth: Payer: Self-pay | Admitting: Family

## 2019-09-26 ENCOUNTER — Other Ambulatory Visit: Payer: Self-pay | Admitting: Family

## 2019-09-26 DIAGNOSIS — B009 Herpesviral infection, unspecified: Secondary | ICD-10-CM

## 2019-09-26 MED ORDER — VALACYCLOVIR HCL 500 MG PO TABS: 500.0000 mg | ORAL_TABLET | Freq: Every day | ORAL | 2 refills | Status: DC

## 2019-09-26 NOTE — Telephone Encounter (Signed)
Requesting script. 

## 2019-09-26 NOTE — Telephone Encounter (Signed)
  Prescription Request  09/26/2019  What is the name of the medication or equipment? valACYclovir (VALTREX) 500 MG tablet    Have you contacted your pharmacy to request a refill? (if applicable) YES  Which pharmacy would you like this sent to? [facility]    Patient notified that their request is being sent to the clinical staff for review and that they should receive a response within 2 business days.

## 2019-09-26 NOTE — Telephone Encounter (Signed)
Valtrex Prescription sent to pharmacy   

## 2019-09-26 NOTE — Telephone Encounter (Signed)
Patient aware, script is ready. 

## 2019-10-08 ENCOUNTER — Other Ambulatory Visit: Payer: Self-pay | Admitting: Family

## 2019-10-08 DIAGNOSIS — M810 Age-related osteoporosis without current pathological fracture: Secondary | ICD-10-CM

## 2019-10-29 LAB — CUP PACEART INCLINIC DEVICE CHECK
Date Time Interrogation Session: 20210526101725
Implantable Pulse Generator Implant Date: 20191115

## 2019-11-25 ENCOUNTER — Other Ambulatory Visit: Payer: Self-pay | Admitting: Family

## 2019-11-25 DIAGNOSIS — I480 Paroxysmal atrial fibrillation: Secondary | ICD-10-CM

## 2019-11-26 ENCOUNTER — Other Ambulatory Visit: Payer: Self-pay | Admitting: Family

## 2019-11-26 DIAGNOSIS — I480 Paroxysmal atrial fibrillation: Secondary | ICD-10-CM

## 2019-11-28 ENCOUNTER — Other Ambulatory Visit: Payer: Self-pay | Admitting: Family

## 2019-11-28 DIAGNOSIS — I480 Paroxysmal atrial fibrillation: Secondary | ICD-10-CM

## 2019-12-11 ENCOUNTER — Emergency Department (HOSPITAL_COMMUNITY)
Admission: EM | Admit: 2019-12-11 | Discharge: 2019-12-11 | Disposition: A | Discharge status: Home / Self Care | Payer: 59 | Attending: Emergency Medicine | Admitting: Emergency Medicine

## 2019-12-11 ENCOUNTER — Encounter (HOSPITAL_COMMUNITY): Payer: Self-pay | Admitting: Emergency Medicine

## 2019-12-11 ENCOUNTER — Other Ambulatory Visit: Payer: Self-pay

## 2019-12-11 ENCOUNTER — Emergency Department (HOSPITAL_COMMUNITY): Payer: 59

## 2019-12-11 DIAGNOSIS — I4892 Unspecified atrial flutter: Secondary | ICD-10-CM | POA: Diagnosis not present

## 2019-12-11 DIAGNOSIS — I10 Essential (primary) hypertension: Secondary | ICD-10-CM | POA: Insufficient documentation

## 2019-12-11 DIAGNOSIS — R002 Palpitations: Secondary | ICD-10-CM | POA: Diagnosis present

## 2019-12-11 LAB — CBC
HCT: 48 % — ABNORMAL HIGH (ref 36.0–46.0)
Hemoglobin: 16.3 g/dL — ABNORMAL HIGH (ref 12.0–15.0)
MCH: 30.6 pg (ref 26.0–34.0)
MCHC: 34 g/dL (ref 30.0–36.0)
MCV: 90.2 fL (ref 80.0–100.0)
Platelets: 286 10*3/uL (ref 150–400)
RBC: 5.32 MIL/uL — ABNORMAL HIGH (ref 3.87–5.11)
RDW: 11.9 % (ref 11.5–15.5)
WBC: 7.5 10*3/uL (ref 4.0–10.5)
nRBC: 0 % (ref 0.0–0.2)

## 2019-12-11 LAB — BASIC METABOLIC PANEL
Anion gap: 12 (ref 5–15)
BUN: 20 mg/dL (ref 8–23)
CO2: 21 mmol/L — ABNORMAL LOW (ref 22–32)
Calcium: 9.3 mg/dL (ref 8.9–10.3)
Chloride: 104 mmol/L (ref 98–111)
Creatinine, Ser: 0.77 mg/dL (ref 0.44–1.00)
GFR calc Af Amer: >60 mL/min (ref >60)
GFR calc non Af Amer: >60 mL/min (ref >60)
Glucose, Bld: 140 mg/dL — ABNORMAL HIGH (ref 70–99)
Potassium: 3.8 mmol/L (ref 3.5–5.1)
Sodium: 137 mmol/L (ref 135–145)

## 2019-12-11 IMAGING — DX DG CHEST 1V PORT
1 series · 1 of 1 positions shown · non-contrast
Comparison: 03/13/2018

CLINICAL DATA: Atrial flutter

EXAM:
PORTABLE CHEST 1 VIEW

[chest ap]
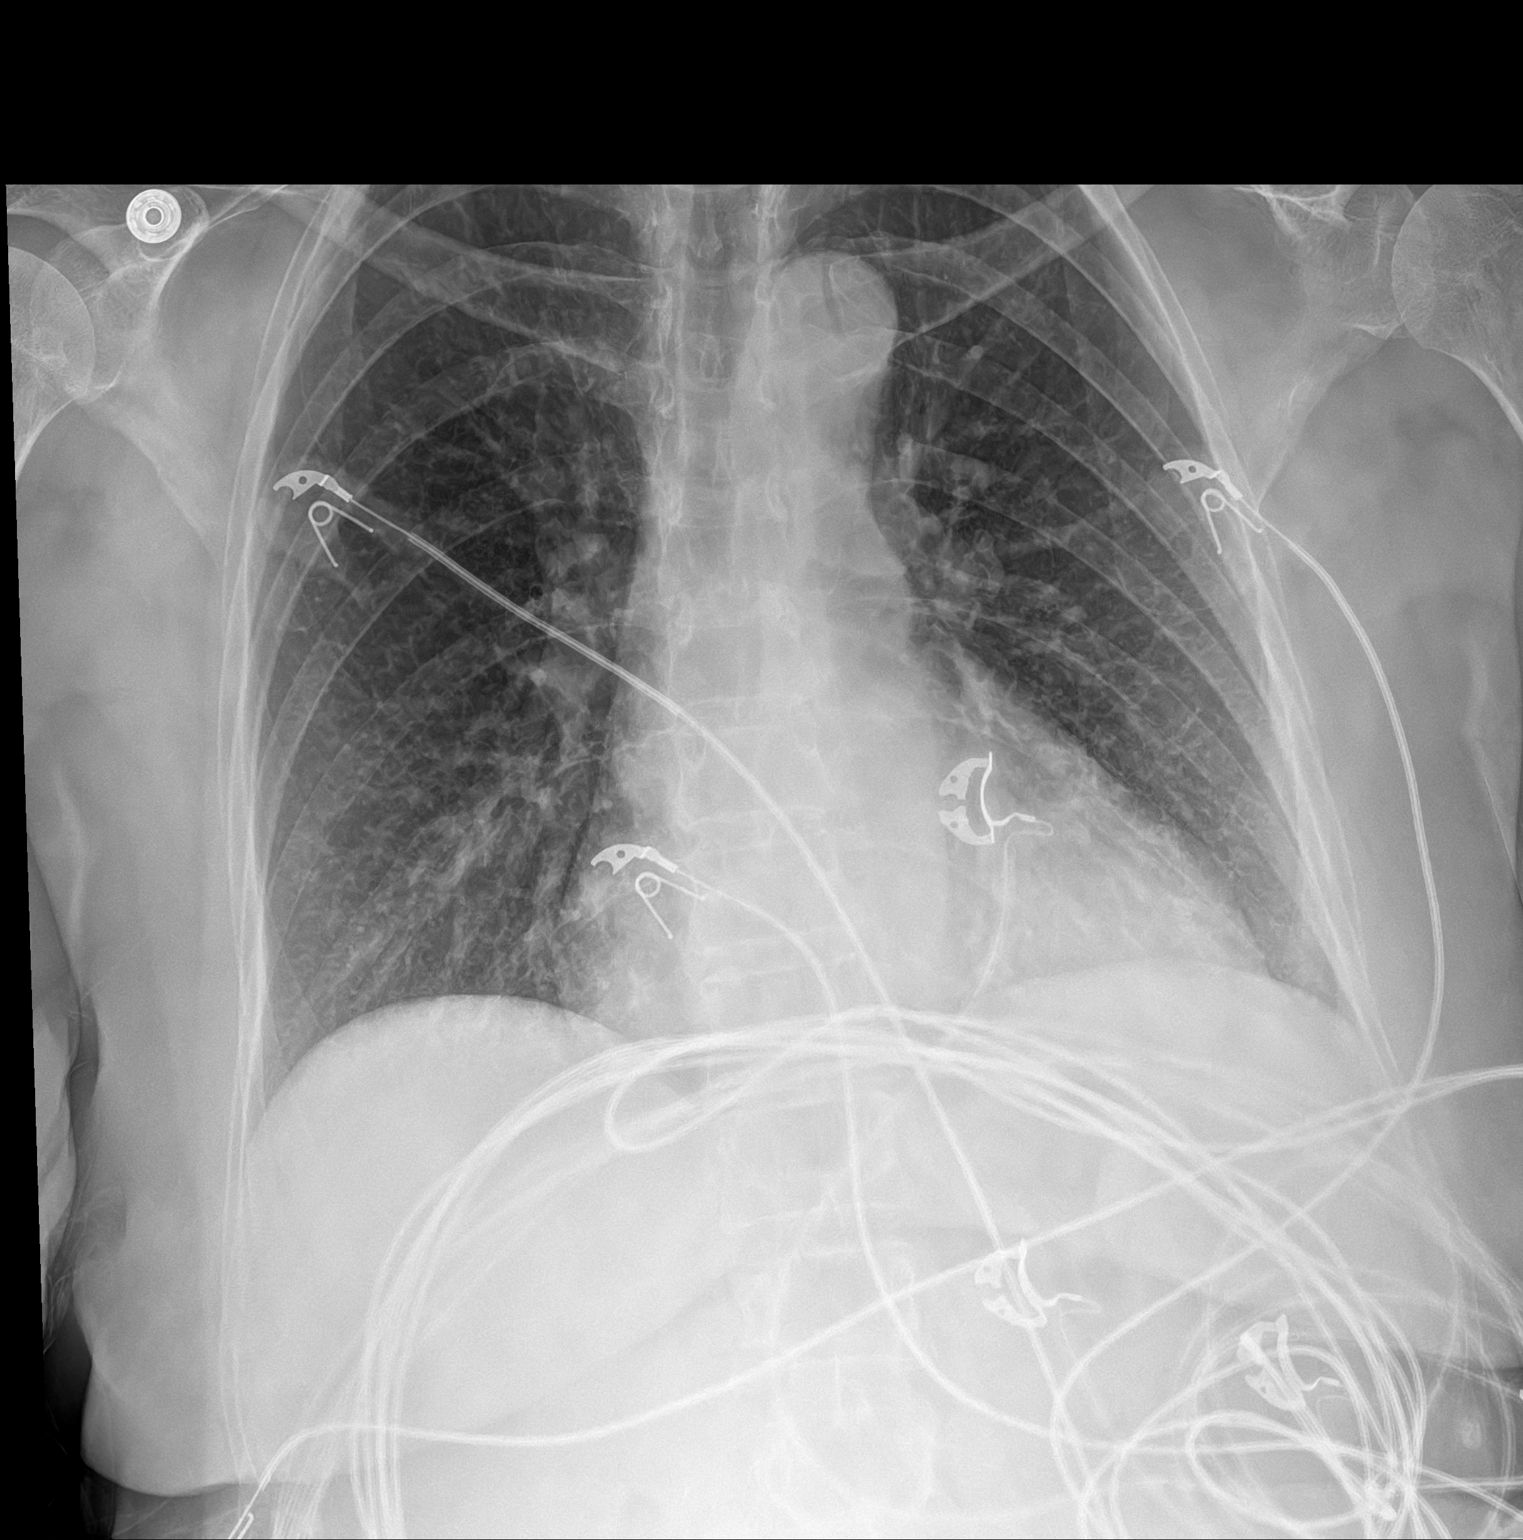

[1 of 1 positions shown; findings below may reference images not displayed]

FINDINGS: Borderline heart size that is stable. Unremarkable mediastinal
contours. Negative for failure or consolidation. Extensive artifact
from EKG leads.
IMPRESSION: Stable exam.  No acute finding.

## 2019-12-11 MED ORDER — ETOMIDATE 2 MG/ML IV SOLN
10.0000 mg | Freq: Once | INTRAVENOUS | Status: AC
  Administered 2019-12-11: 10 mg via INTRAVENOUS
  Filled 2019-12-11: qty 10

## 2019-12-11 MED ORDER — SODIUM CHLORIDE 0.9 % IV BOLUS
1000.0000 mL | Freq: Once | INTRAVENOUS | Status: AC
  Administered 2019-12-11: 1000 mL via INTRAVENOUS

## 2019-12-11 NOTE — ED Provider Notes (Signed)
[facility]-EMERGENCY DEPT [facility] Emergency Department Provider Note MRN:  [id number]  Arrival date & time: 12/11/19     Chief Complaint   Atrial Fibrillation (RVR)   History of Present Illness   Kathryn Arnold is a 74 y.o. year-old female with history of hypertension, paroxysmal A. fib presenting to the ED with chief complaint of palpitations.  Sudden onset palpitations and racing heart that started yesterday evening.  Constant since that time.  Denies chest pain.  Tried taking a large dose of Cardizem this morning but did not help.  No shortness of breath, no recent fever or cough or burning with urination, no abdominal pain, no other complaints.  No leg pain or swelling.  Endorses full compliance with Xarelto at home, has not missed any doses in the past month.  Review of Systems  A complete 10 system review of systems was obtained and all systems are negative except as noted in the HPI and PMH.   Patient's Health History    Past Medical History:  Diagnosis Date  . Abnormal Pap smear   . Arthritis    "fingers" (10/31/2016)  . Complication of anesthesia    a little slow to wake up-stayed drowsy  . 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 HSV-2 (herpes simplex virus 2) infection   . Hypercholesteremia   . Hyperlipidemia with target LDL less than 100   . Hypertension   . Osteoporosis, unspecified    Last DEXA 10/2011   . Paroxysmal atrial fibrillation (HCC)    s/p ablation Afib and flutter 11/03/14  . PONV (postoperative nausea and vomiting)   . Vitamin D deficiency     Past Surgical History:  Procedure Laterality Date  . ATRIAL FIBRILLATION ABLATION N/A 10/31/2016   Procedure: Atrial Fibrillation Ablation;  Surgeon: Hillis RangeAllred, James, MD;  [facility]: [facility] IN[facility] CV LAB;  Service: Cardiovascular;  Laterality: N/A;  . BREAST LUMPECTOMY Left   . DILATION AND CURETTAGE OF UTERUS    . ELECTROPHYSIOLOGIC STUDY N/A 11/03/2014   Procedure: Atrial Fibrillation Ablation;  Surgeon:  Hillis RangeJames Allred, MD;  [facility];  Service: Cardiovascular;  Laterality: N/A;  . implantable loop recorder placement  03/08/2018   Medtronic Reveal LINQ implantation by Dr Johney FrameAllred for afib management post ablation  . LAPAROSCOPY ABDOMEN DIAGNOSTIC  1990s  . LOOP RECORDER REMOVAL  09/17/2019   MDT Reveal LINQ removed in office by Dr Johney FrameAllred  . TEE WITHOUT CARDIOVERSION N/A 10/30/2016   Procedure: TRANSESOPHAGEAL ECHOCARDIOGRAM (TEE);  Surgeon: Thurmon Fairroitoru, Mihai, MD;  [facility];  Service: Cardiovascular;  Laterality: N/A;  . TUBAL LIGATION  1978    Family History  Problem Relation Age of Onset  . Osteoporosis Mother   . Cancer Mother        breast  . Hypertension Mother   . Heart disease Father   . Other Neg Hx     Social History   Socioeconomic History  . Marital status: Married    Spouse name: Not on file  . Number of children: Not on file  . Years of education: Not on file  . Highest education level: Not on file  Occupational History  . Not on file  Tobacco Use  . Smoking status: Never Smoker  . Smokeless tobacco: Never Used  Vaping Use  . Vaping Use: Never used  Substance and Sexual Activity  . Alcohol use: No  . Drug use: No  . Sexual activity: Not Currently    Birth control/protection: None  Other Topics Concern  . Not on file  Social History Narrative   Lives in [place] with spouse.   Social Determinants of Health   Financial Resource Strain:   . Difficulty of Paying Living Expenses: Not on file  Food Insecurity:   . Worried About Programme researcher, broadcasting/film/video in the Last Year: Not on file  . Ran Out of Food in the Last Year: Not on file  Transportation Needs:   . Lack of Transportation (Medical): Not on file  . Lack of Transportation (Non-Medical): Not on file  Physical Activity:   . Days of Exercise per Week: Not on file  . Minutes of Exercise per Session: Not on file  Stress:   . Feeling of Stress : Not on file  Social Connections:   .  Frequency of Communication with Friends and Family: Not on file  . Frequency of Social Gatherings with Friends and Family: Not on file  . Attends Religious Services: Not on file  . Active Member of Clubs or Organizations: Not on file  . Attends Banker Meetings: Not on file  . Marital Status: Not on file  Intimate Partner Violence:   . Fear of Current or Ex-Partner: Not on file  . Emotionally Abused: Not on file  . Physically Abused: Not on file  . Sexually Abused: Not on file     Physical Exam   Vitals:   12/11/19 0826 12/11/19 0828  BP: 128/71 125/71  Pulse: (!) 56 (!) 55  Resp: 15 15  Temp:    SpO2: 100% 100%    CONSTITUTIONAL: Well-appearing, NAD NEURO:  Alert and oriented x 3, no focal deficits EYES:  eyes equal and reactive ENT/NECK:  no LAD, no JVD CARDIO: Tachycardic rate, well-perfused, normal S1 and S2 PULM:  CTAB no wheezing or rhonchi GI/GU:  normal bowel sounds, non-distended, non-tender MSK/SPINE:  No gross deformities, no edema SKIN:  no rash, atraumatic PSYCH:  Appropriate speech and behavior  *Additional and/or pertinent findings included in MDM below  Diagnostic and Interventional Summary    EKG Interpretation  Date/Time:  Thursday December 11 2019 07:35:13 EDT Ventricular Rate:  160 PR Interval:    QRS Duration: 142 QT Interval:  311 QTC Calculation: 508 R Axis:   -164 Text Interpretation: Atrial flutter with aberrancy confirmed by Kennis Carina [phone]) on 12/11/2019 7:48:04 AM       EKG Interpretation  Date/Time:  Thursday December 11 2019 08:16:03 EDT Ventricular Rate:  60 PR Interval:    QRS Duration: 123 QT Interval:  479 QTC Calculation: 479 R Axis:   -14 Text Interpretation: Sinus rhythm Nonspecific intraventricular conduction delay Borderline T abnormalities, anterior leads Confirmed by Kennis Carina [phone]) on 12/11/2019 8:19:49 AM       Labs Reviewed  CBC - Abnormal; Notable for the following components:      Result  Value   RBC 5.32 (*)    Hemoglobin 16.3 (*)    HCT 48.0 (*)    All other components within normal limits  BASIC METABOLIC PANEL - Abnormal; Notable for the following components:   CO2 21 (*)    Glucose, Bld 140 (*)    All other components within normal limits    DG Chest Port 1 View  Final Result      Medications  sodium chloride 0.9 % bolus 1,000 mL (1,000 mLs Intravenous New Bag/Given 12/11/19 0802)  etomidate (AMIDATE) injection 10 mg (10 mg Intravenous Given 12/11/19 0807)     Procedures  /  Critical Care .Sedation  Date/Time: 12/11/2019 8:15 AM Performed by: Sabas Sous, MD Authorized by: Sabas Sous, MD   Consent:    Consent obtained:  Verbal and written   Consent given by:  Patient   Risks discussed:  Allergic reaction, dysrhythmia, inadequate sedation, nausea, vomiting, respiratory compromise necessitating ventilatory assistance and intubation and prolonged hypoxia resulting in organ damage Universal protocol:    Immediately prior to procedure a time out was called: yes     Patient identity confirmation method:  Arm band, hospital-assigned identification number, verbally with patient and provided demographic data Indications:    Procedure performed:  Cardioversion   Procedure necessitating sedation performed by:  Physician performing sedation Pre-sedation assessment:    Time since last food or drink:  3 hours   ASA classification: class 1 - normal, healthy patient     Mallampati score:  I - soft palate, uvula, fauces, pillars visible   Pre-sedation assessments completed and reviewed: airway patency, cardiovascular function, hydration status, mental status, nausea/vomiting, pain level, respiratory function and temperature   Immediate pre-procedure details:    Reassessment: Patient reassessed immediately prior to procedure     Reviewed: vital signs and relevant labs/tests     Verified: bag valve mask available, emergency equipment available, intubation equipment  available, IV patency confirmed, oxygen available and suction available   Procedure details (see MAR for exact dosages):    Preoxygenation:  Nasal cannula   Sedation:  Etomidate   Intended level of sedation: deep   Intra-procedure monitoring:  Cardiac monitor, blood pressure monitoring, continuous capnometry, continuous pulse oximetry, frequent vital sign checks and frequent LOC assessments   Intra-procedure events: none     Total Provider sedation time (minutes):  17 Post-procedure details:    Attendance: Constant attendance by certified staff until patient recovered     Recovery: Patient returned to pre-procedure baseline     Post-sedation assessments completed and reviewed: airway patency, cardiovascular function, hydration status, mental status, nausea/vomiting, pain level, respiratory function and temperature     Patient is stable for discharge or admission: yes     Patient tolerance:  Tolerated well, no immediate complications Comments:     Uncomplicated sedation using 10 mg etomidate. .Cardioversion  Date/Time: 12/11/2019 8:16 AM Performed by: Sabas Sous, MD Authorized by: Sabas Sous, MD   Consent:    Consent obtained:  Verbal and written   Consent given by:  Patient   Risks discussed:  Cutaneous burn, death, induced arrhythmia and pain   Alternatives discussed:  Rate-control medication Pre-procedure details:    Cardioversion basis:  Elective   Rhythm:  Atrial flutter   Electrode placement:  Anterior-posterior Patient sedated: Yes. Refer to sedation procedure documentation for details of sedation.  Attempt one:    Cardioversion mode:  Synchronous   Waveform:  Biphasic   Shock (Joules):  100   Shock outcome:  Conversion to normal sinus rhythm Post-procedure details:    Patient status:  Awake   Patient tolerance of procedure:  Tolerated well, no immediate complications .Critical Care Performed by: Sabas Sous, MD Authorized by: Sabas Sous, MD    Critical care provider statement:    Critical care time (minutes):  31   Critical care was necessary to treat or prevent imminent or life-threatening deterioration of the following conditions: Atrial flutter with rapid ventricular response.   Critical care was time spent personally by me on the following activities:  Discussions with consultants, evaluation of patient's response to treatment, examination  of patient, ordering and performing treatments and interventions, ordering and review of laboratory studies, ordering and review of radiographic studies, pulse oximetry, re-evaluation of patient's condition, obtaining history from patient or surrogate and review of old charts    ED Course and Medical Decision Making  I have reviewed the triage vital signs, the nursing notes, and pertinent available records from the EMR.  Listed above are laboratory and imaging tests that I personally ordered, reviewed, and interpreted and then considered in my medical decision making (see below for details).  Atrial flutter with pretty clear onset of yesterday evening, fully anticoagulated with no missed doses recently.  Excellent candidate for cardioversion.  Hemodynamically stable at this time, rates between 150 and 165.  Discussed management options, given that she has had issues converting with medications in the past and the fact that she has tried diltiazem this morning with no effect, will proceed with cardioversion.     Cardioversion successful, as described above.  Patient curious about her daily medications and if her doses need to be adjusted.  I discussed case with Dr. Johney Frame, her personal cardiologist.  Plan is to continue daily medications as prescribed and follow-up in the A. fib clinic within the next 2 or 3 days.  Elmer Sow. Pilar Plate, MD [facility] Emergency Medicine [facility] [email or web address]  Final Clinical Impressions(s) / ED Diagnoses     ICD-10-CM   1. Atrial  flutter with rapid ventricular response [facility])  I48.92     ED Discharge Orders         Ordered    Amb Referral to AFIB Clinic        12/11/19 0913           Discharge Instructions Discussed with and Provided to Patient:     Discharge Instructions     You were evaluated in the Emergency Department and after careful evaluation, we did not find any emergent condition requiring admission or further testing in the hospital.  Your exam/testing today was overall reassuring.  Your symptoms seemed to be due to atrial flutter with an elevated heart rate.  We were able to do a cardioversion here in the emergency department to bring it back to a normal heart rhythm.  We discussed your case with Dr. Johney Frame, you can continue your daily medications for now and they will see you in the clinic within the next 3 days.  Please call the number provided if you do not hear from them by Friday morning.  Please return to the Emergency Department if you experience any worsening of your condition.  Thank you for allowing [place] to be a part of your care.        Sabas Sous, MD 12/11/19 [phone]

## 2019-12-11 NOTE — Discharge Instructions (Addendum)
You were evaluated in the Emergency Department and after careful evaluation, we did not find any emergent condition requiring admission or further testing in the hospital.  Your exam/testing today was overall reassuring.  Your symptoms seemed to be due to atrial flutter with an elevated heart rate.  We were able to do a cardioversion here in the emergency department to bring it back to a normal heart rhythm.  We discussed your case with Dr. Johney Frame, you can continue your daily medications for now and they will see you in the clinic within the next 3 days.  Please call the number provided if you do not hear from them by Friday morning.  Please return to the Emergency Department if you experience any worsening of your condition.  Thank you for allowing [place] to be a part of your care.

## 2019-12-11 NOTE — ED Triage Notes (Signed)
History of A-Fib.  HR yesterday was 150 but took Cardizem.  This am at 0500 HR 158.  HR in Triage 164.  Denies any other issues.

## 2019-12-11 NOTE — ED Notes (Signed)
Pt alert and oriented x4 

## 2019-12-11 NOTE — ED Notes (Signed)
Pt awake and talking.  Denies any pain at this time.

## 2019-12-11 NOTE — ED Notes (Signed)
Shocked at 100 joules, Converted to NSR.  BP 153/86.  NSR 61, EtCo2 30

## 2019-12-16 ENCOUNTER — Other Ambulatory Visit: Payer: Self-pay

## 2019-12-16 ENCOUNTER — Encounter (HOSPITAL_COMMUNITY): Payer: Self-pay | Admitting: Nurse Practitioner

## 2019-12-16 ENCOUNTER — Ambulatory Visit (HOSPITAL_COMMUNITY)
Admit: 2019-12-16 | Discharge: 2019-12-16 | Disposition: A | Discharge status: Home / Self Care | Payer: 59 | Attending: Nurse Practitioner | Admitting: Nurse Practitioner

## 2019-12-16 VITALS — BP 168/84 | HR 55 | Ht 65.0 in | Wt 156.8 lb

## 2019-12-16 DIAGNOSIS — Z7901 Long term (current) use of anticoagulants: Secondary | ICD-10-CM | POA: Diagnosis not present

## 2019-12-16 DIAGNOSIS — M810 Age-related osteoporosis without current pathological fracture: Secondary | ICD-10-CM | POA: Insufficient documentation

## 2019-12-16 DIAGNOSIS — Z79899 Other long term (current) drug therapy: Secondary | ICD-10-CM | POA: Insufficient documentation

## 2019-12-16 DIAGNOSIS — E7800 Pure hypercholesterolemia, unspecified: Secondary | ICD-10-CM | POA: Insufficient documentation

## 2019-12-16 DIAGNOSIS — I480 Paroxysmal atrial fibrillation: Secondary | ICD-10-CM | POA: Diagnosis present

## 2019-12-16 DIAGNOSIS — D6869 Other thrombophilia: Secondary | ICD-10-CM | POA: Diagnosis not present

## 2019-12-16 DIAGNOSIS — E785 Hyperlipidemia, unspecified: Secondary | ICD-10-CM | POA: Insufficient documentation

## 2019-12-16 DIAGNOSIS — I10 Essential (primary) hypertension: Secondary | ICD-10-CM | POA: Insufficient documentation

## 2019-12-16 NOTE — Progress Notes (Signed)
Primary Care Physician: Junie Spencer, FNP Referring Physician: Dr. Tyrone Schimke is a 74 y.o. female with a h/o afib with 2 ablations and on flecainide that is in the afib clinic f/u cardioversion in the ER.pt has not had any afib x one year. She states that she got take out fast food and went into afib. This has been the trigger in the past. She is in SR today and feels well.   Today, she denies symptoms of palpitations, chest pain, shortness of breath, orthopnea, PND, lower extremity edema, dizziness, presyncope, syncope, or neurologic sequela. The patient is tolerating medications without difficulties and is otherwise without complaint today.   Past Medical History:  Diagnosis Date  . Abnormal Pap smear   . Arthritis    "fingers" (10/31/2016)  . Complication of anesthesia    a little slow to wake up-stayed drowsy  . 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 HSV-2 (herpes simplex virus 2) infection   . Hypercholesteremia   . Hyperlipidemia with target LDL less than 100   . Hypertension   . Osteoporosis, unspecified    Last DEXA 10/2011   . Paroxysmal atrial fibrillation (HCC)    s/p ablation Afib and flutter 11/03/14  . PONV (postoperative nausea and vomiting)   . Vitamin D deficiency    Past Surgical History:  Procedure Laterality Date  . ATRIAL FIBRILLATION ABLATION N/A 10/31/2016   Procedure: Atrial Fibrillation Ablation;  Surgeon: Hillis Range, MD;  Location: [facility] INVASIVE CV LAB;  Service: Cardiovascular;  Laterality: N/A;  . BREAST LUMPECTOMY Left   . DILATION AND CURETTAGE OF UTERUS    . ELECTROPHYSIOLOGIC STUDY N/A 11/03/2014   Procedure: Atrial Fibrillation Ablation;  Surgeon: Hillis Range, MD;  Location: [facility] INVASIVE CV LAB;  Service: Cardiovascular;  Laterality: N/A;  . implantable loop recorder placement  03/08/2018   Medtronic Reveal LINQ implantation by Dr Johney Frame for afib management post ablation  . LAPAROSCOPY ABDOMEN DIAGNOSTIC  1990s  . LOOP  RECORDER REMOVAL  09/17/2019   MDT Reveal LINQ removed in office by Dr Johney Frame  . TEE WITHOUT CARDIOVERSION N/A 10/30/2016   Procedure: TRANSESOPHAGEAL ECHOCARDIOGRAM (TEE);  Surgeon: Thurmon Fair, MD;  Location: [facility] ENDOSCOPY;  Service: Cardiovascular;  Laterality: N/A;  . TUBAL LIGATION  1978    Current Outpatient Medications  Medication Sig Dispense Refill  . alendronate (FOSAMAX) 70 MG tablet TAKE 1 TABLET BY MOUTH ONCE A WEEK WITH A FULL GLASS OF WATER ON AN EMPTY STOMACH 12 tablet 0  . Ascorbic Acid (VITAMIN C PO) Take one tablet by mouth daily    . Calcium Citrate-Vitamin D (CITRACAL + D PO) Take 500 mg by mouth 2 (two) times daily.    [name] diltiazem (CARDIZEM CD) 180 MG 24 hr capsule Take 1 capsule (180 mg total) by mouth daily. 90 capsule 1  . diltiazem (CARDIZEM) 60 MG tablet TAKE 1/2 (ONE-HALF) TO 1 TABLET EVERY 4 HOURS AS NEEDED FOR HEART RATE GREATER THAN 100 AS LONG AS BLOOD PRESSURE IS GREATER THAN 100 30 tablet 2  . flecainide (TAMBOCOR) 50 MG tablet Take 1 tablet (50 mg total) by mouth 2 (two) times daily. (Needs to be seen before next refill) 60 tablet 0  . Magnesium 250 MG TABS Take 1 tablet by mouth every evening.    . Multiple Vitamins-Minerals (CENTRUM SILVER PO) Take 1 tablet by mouth daily.    . rivaroxaban (XARELTO) 20 MG TABS tablet Take 1 tablet (20 mg total) by  mouth at bedtime. 90 tablet 2  . rosuvastatin (CRESTOR) 20 MG tablet Take 1 tablet (20 mg total) by mouth at bedtime. 90 tablet 1  . valACYclovir (VALTREX) 500 MG tablet Take 1 tablet (500 mg total) by mouth daily. 90 tablet 2   No current facility-administered medications for this encounter.    No Known Allergies  Social History   Socioeconomic History  . Marital status: Married    Spouse name: Not on file  . Number of children: Not on file  . Years of education: Not on file  . Highest education level: Not on file  Occupational History  . Not on file  Tobacco Use  . Smoking status: Never Smoker  .  Smokeless tobacco: Never Used  Vaping Use  . Vaping Use: Never used  Substance and Sexual Activity  . Alcohol use: No  . Drug use: No  . Sexual activity: Not Currently    Birth control/protection: None  Other Topics Concern  . Not on file  Social History Narrative   Lives in [place] with spouse.   Social Determinants of Health   Financial Resource Strain:   . Difficulty of Paying Living Expenses: Not on file  Food Insecurity:   . Worried About Programme researcher, broadcasting/film/video in the Last Year: Not on file  . Ran Out of Food in the Last Year: Not on file  Transportation Needs:   . Lack of Transportation (Medical): Not on file  . Lack of Transportation (Non-Medical): Not on file  Physical Activity:   . Days of Exercise per Week: Not on file  . Minutes of Exercise per Session: Not on file  Stress:   . Feeling of Stress : Not on file  Social Connections:   . Frequency of Communication with Friends and Family: Not on file  . Frequency of Social Gatherings with Friends and Family: Not on file  . Attends Religious Services: Not on file  . Active Member of Clubs or Organizations: Not on file  . Attends Banker Meetings: Not on file  . Marital Status: Not on file  Intimate Partner Violence:   . Fear of Current or Ex-Partner: Not on file  . Emotionally Abused: Not on file  . Physically Abused: Not on file  . Sexually Abused: Not on file    Family History  Problem Relation Age of Onset  . Osteoporosis Mother   . Cancer Mother        breast  . Hypertension Mother   . Heart disease Father   . Other Neg Hx     ROS- All systems are reviewed and negative except as per the HPI above  Physical Exam: Vitals:   12/16/19 1429  BP: (!) 168/84  Pulse: (!) 55  Weight: 71.1 kg  Height: 5\' 5"  (1.651 m)   Wt Readings from Last 3 Encounters:  12/16/19 71.1 kg  12/11/19 68 kg  09/17/19 71.7 kg    Labs: Lab Results  Component Value Date   NA 137 12/11/2019   K 3.8 12/11/2019    CL 104 12/11/2019   CO2 21 (L) 12/11/2019   GLUCOSE 140 (H) 12/11/2019   BUN 20 12/11/2019   CREATININE 0.77 12/11/2019   CALCIUM 9.3 12/11/2019   PHOS 2.8 03/13/2018   MG 2.0 03/13/2018   No results found for: INR Lab Results  Component Value Date   CHOL 162 06/06/2019   HDL 55 06/06/2019   LDLCALC 89 06/06/2019   TRIG 99 06/06/2019  GEN- The patient is well appearing, alert and oriented x 3 today.   Head- normocephalic, atraumatic Eyes-  Sclera clear, conjunctiva pink Ears- hearing intact Oropharynx- clear Neck- supple, no JVP Lymph- no cervical lymphadenopathy Lungs- Clear to ausculation bilaterally, normal work of breathing Heart- Regular rate and rhythm, no murmurs, rubs or gallops, PMI not laterally displaced GI- soft, NT, ND, + BS Extremities- no clubbing, cyanosis, or edema MS- no significant deformity or atrophy Skin- no rash or lesion Psych- euthymic mood, full affect Neuro- strength and sensation are intact  EKG-sinus brady at 55  bpm, PR int 150 ms, qrs int 120  ms, qtc 434 ms Epic records reviewed    Assessment and Plan: 1. Paroxysmal afib Has been very quiet until last episode on 8/19 with successful DCCV at [facility] ER  8/19 Continue with flecainide 50 mg bid   2. LInq Removed MAY 2021  3. CHA2DS2VASc score of 3 Continue xarelto 20 mg daily Reminded not to miss doses for one month following a cardioversion   4. HTN Elevated today Monitor  at home   Lupita Leash C. [facility] [address] [place], [place] [address] [phone]  F/u as scheduled in April 2022 in afib clinic

## 2019-12-18 ENCOUNTER — Ambulatory Visit: Payer: 59 | Admitting: Family

## 2019-12-18 ENCOUNTER — Other Ambulatory Visit: Payer: Self-pay

## 2019-12-18 ENCOUNTER — Encounter: Payer: Self-pay | Admitting: Family

## 2019-12-18 VITALS — BP 140/79 | HR 56 | Temp 98.1°F | Ht 65.0 in | Wt 154.8 lb

## 2019-12-18 DIAGNOSIS — B009 Herpesviral infection, unspecified: Secondary | ICD-10-CM | POA: Diagnosis not present

## 2019-12-18 DIAGNOSIS — M810 Age-related osteoporosis without current pathological fracture: Secondary | ICD-10-CM

## 2019-12-18 DIAGNOSIS — I10 Essential (primary) hypertension: Secondary | ICD-10-CM

## 2019-12-18 DIAGNOSIS — M1990 Unspecified osteoarthritis, unspecified site: Secondary | ICD-10-CM

## 2019-12-18 DIAGNOSIS — F411 Generalized anxiety disorder: Secondary | ICD-10-CM

## 2019-12-18 DIAGNOSIS — E559 Vitamin D deficiency, unspecified: Secondary | ICD-10-CM

## 2019-12-18 DIAGNOSIS — I4891 Unspecified atrial fibrillation: Secondary | ICD-10-CM

## 2019-12-18 DIAGNOSIS — E785 Hyperlipidemia, unspecified: Secondary | ICD-10-CM

## 2019-12-18 DIAGNOSIS — I480 Paroxysmal atrial fibrillation: Secondary | ICD-10-CM

## 2019-12-18 MED ORDER — VALACYCLOVIR HCL 500 MG PO TABS: 500.0000 mg | ORAL_TABLET | Freq: Every day | ORAL | 2 refills | Status: DC

## 2019-12-18 MED ORDER — ALENDRONATE SODIUM 70 MG PO TABS: ORAL_TABLET | ORAL | 0 refills | Status: DC

## 2019-12-18 MED ORDER — DILTIAZEM HCL 60 MG PO TABS: ORAL_TABLET | ORAL | 2 refills | Status: DC

## 2019-12-18 MED ORDER — DILTIAZEM HCL ER COATED BEADS 180 MG PO CP24: 180.0000 mg | ORAL_CAPSULE | Freq: Every day | ORAL | 1 refills | Status: DC

## 2019-12-18 MED ORDER — ROSUVASTATIN CALCIUM 20 MG PO TABS: 20.0000 mg | ORAL_TABLET | Freq: Every day | ORAL | 1 refills | Status: DC

## 2019-12-18 MED ORDER — RIVAROXABAN 20 MG PO TABS: 20.0000 mg | ORAL_TABLET | Freq: Every day | ORAL | 2 refills | Status: DC

## 2019-12-18 MED ORDER — FLECAINIDE ACETATE 50 MG PO TABS: 50.0000 mg | ORAL_TABLET | Freq: Two times a day (BID) | ORAL | 0 refills | Status: DC

## 2019-12-18 NOTE — Patient Instructions (Signed)
Health Maintenance After Age 72 After age 72, you are at a higher risk for certain long-term diseases and infections as well as injuries from falls. Falls are a major cause of broken bones and head injuries in people who are older than age 72. Getting regular preventive care can help to keep you healthy and well. Preventive care includes getting regular testing and making lifestyle changes as recommended by your health care provider. Talk with your health care provider about:  Which screenings and tests you should have. A screening is a test that checks for a disease when you have no symptoms.  A diet and exercise plan that is right for you. What should I know about screenings and tests to prevent falls? Screening and testing are the best ways to find a health problem early. Early diagnosis and treatment give you the best chance of managing medical conditions that are common after age 72. Certain conditions and lifestyle choices may make you more likely to have a fall. Your health care provider may recommend:  Regular vision checks. Poor vision and conditions such as cataracts can make you more likely to have a fall. If you wear glasses, make sure to get your prescription updated if your vision changes.  Medicine review. Work with your health care provider to regularly review all of the medicines you are taking, including over-the-counter medicines. Ask your health care provider about any side effects that may make you more likely to have a fall. Tell your health care provider if any medicines that you take make you feel dizzy or sleepy.  Osteoporosis screening. Osteoporosis is a condition that causes the bones to get weaker. This can make the bones weak and cause them to break more easily.  Blood pressure screening. Blood pressure changes and medicines to control blood pressure can make you feel dizzy.  Strength and balance checks. Your health care provider may recommend certain tests to check your  strength and balance while standing, walking, or changing positions.  Foot health exam. Foot pain and numbness, as well as not wearing proper footwear, can make you more likely to have a fall.  Depression screening. You may be more likely to have a fall if you have a fear of falling, feel emotionally low, or feel unable to do activities that you used to do.  Alcohol use screening. Using too much alcohol can affect your balance and may make you more likely to have a fall. What actions can I take to lower my risk of falls? General instructions  Talk with your health care provider about your risks for falling. Tell your health care provider if: ? You fall. Be sure to tell your health care provider about all falls, even ones that seem minor. ? You feel dizzy, sleepy, or off-balance.  Take over-the-counter and prescription medicines only as told by your health care provider. These include any supplements.  Eat a healthy diet and maintain a healthy weight. A healthy diet includes low-fat dairy products, low-fat (lean) meats, and fiber from whole grains, beans, and lots of fruits and vegetables. Home safety  Remove any tripping hazards, such as rugs, cords, and clutter.  Install safety equipment such as grab bars in bathrooms and safety rails on stairs.  Keep rooms and walkways well-lit. Activity   Follow a regular exercise program to stay fit. This will help you maintain your balance. Ask your health care provider what types of exercise are appropriate for you.  If you need a cane or   walker, use it as recommended by your health care provider.  Wear supportive shoes that have nonskid soles. Lifestyle  Do not drink alcohol if your health care provider tells you not to drink.  If you drink alcohol, limit how much you have: ? 0-1 drink a day for women. ? 0-2 drinks a day for men.  Be aware of how much alcohol is in your drink. In the [place] one drink equals one typical bottle of beer (12  oz), one-half glass of wine (5 oz), or one shot of hard liquor (1 oz).  Do not use any products that contain nicotine or tobacco, such as cigarettes and e-cigarettes. If you need help quitting, ask your health care provider. Summary  Having a healthy lifestyle and getting preventive care can help to protect your health and wellness after age 72.  Screening and testing are the best way to find a health problem early and help you avoid having a fall. Early diagnosis and treatment give you the best chance for managing medical conditions that are more common for people who are older than age 72.  Falls are a major cause of broken bones and head injuries in people who are older than age 72. Take precautions to prevent a fall at home.  Work with your health care provider to learn what changes you can make to improve your health and wellness and to prevent falls. This information is not intended to replace advice given to you by your health care provider. Make sure you discuss any questions you have with your health care provider. Document Revised: 08/01/2018 Document Reviewed: 02/21/2017 Elsevier Patient Education  2020 Elsevier Inc.  

## 2019-12-18 NOTE — Progress Notes (Signed)
Subjective:    Patient ID: Kathryn Arnold, female    DOB: [birth year], 74 y.o.   MRN: [id number]  Chief Complaint  Patient presents with  . Medical Management of Chronic Issues    no concerns, not fasting    Pt presents to the office today forchronic follow up. Pt is followed by Cardiologists for A Fib and had an ablation on 10/31/16 and 2016.She had a loop recorder placed on 03/08/18. She takes Xarelto 20 mg daily.  \  She had an episode of A Fib on 12/11/19 and went to the ED and had cardioversion. Since then has been doing great! Hypertension This is a chronic problem. The current episode started more than 1 year ago. The problem has been resolved since onset. The problem is controlled. Associated symptoms include anxiety. Pertinent negatives include no headaches, malaise/fatigue, peripheral edema or shortness of breath. Risk factors for coronary artery disease include obesity and sedentary lifestyle. The current treatment provides moderate improvement.  Arthritis Presents for follow-up visit. She complains of pain and stiffness. The symptoms have been stable. Affected locations include the left MCP and right MCP. Her pain is at a severity of 1/10.  Hyperlipidemia This is a chronic problem. The current episode started more than 1 year ago. The problem is controlled. Recent lipid tests were reviewed and are normal. Pertinent negatives include no shortness of breath. Current antihyperlipidemic treatment includes statins. The current treatment provides moderate improvement of lipids. Risk factors for coronary artery disease include hypertension, a sedentary lifestyle, post-menopausal and dyslipidemia.  Anxiety Presents for follow-up visit. Symptoms include depressed mood, irritability and restlessness. Patient reports no shortness of breath.        Review of Systems  Constitutional: Positive for irritability. Negative for malaise/fatigue.  Respiratory: Negative for shortness of  breath.   Musculoskeletal: Positive for arthritis and stiffness.  Neurological: Negative for headaches.  All other systems reviewed and are negative.      Objective:   Physical Exam Vitals reviewed.  Constitutional:      General: She is not in acute distress.    Appearance: She is well-developed.  HENT:     Head: Normocephalic and atraumatic.     Right Ear: Tympanic membrane normal.     Left Ear: Tympanic membrane normal.  Eyes:     Pupils: Pupils are equal, round, and reactive to light.  Neck:     Thyroid: No thyromegaly.  Cardiovascular:     Rate and Rhythm: Normal rate and regular rhythm.     Heart sounds: Normal heart sounds. No murmur heard.   Pulmonary:     Effort: Pulmonary effort is normal. No respiratory distress.     Breath sounds: Normal breath sounds. No wheezing.  Abdominal:     General: Bowel sounds are normal. There is no distension.     Palpations: Abdomen is soft.     Tenderness: There is no abdominal tenderness.  Musculoskeletal:        General: No tenderness. Normal range of motion.     Cervical back: Normal range of motion and neck supple.  Skin:    General: Skin is warm and dry.  Neurological:     Mental Status: She is alert and oriented to person, place, and time.     Cranial Nerves: No cranial nerve deficit.     Deep Tendon Reflexes: Reflexes are normal and symmetric.  Psychiatric:        Behavior: Behavior normal.  Thought Content: Thought content normal.        Judgment: Judgment normal.      BP 140/79   Pulse (!) 56   Temp 98.1 F (36.7 C) (Temporal)   Ht 5\' 5"  (1.651 m)   Wt 154 lb 12.8 oz (70.2 kg)   SpO2 97%   BMI 25.76 kg/m       Assessment & Plan:  [name] comes in today with chief complaint of Medical Management of Chronic Issues (no concerns, not fasting )   Diagnosis and orders addressed:  1. Hyperlipidemia with target LDL less than 100 - rosuvastatin (CRESTOR) 20 MG tablet; Take 1 tablet (20 mg total)  by mouth at bedtime.  Dispense: 90 tablet; Refill: 1  2. Osteoporosis, unspecified osteoporosis type, unspecified pathological fracture presence - alendronate (FOSAMAX) 70 MG tablet; TAKE 1 TABLET BY MOUTH ONCE A WEEK WITH A FULL GLASS OF WATER ON AN EMPTY STOMACH  Dispense: 12 tablet; Refill: 0  3. HSV-2 (herpes simplex virus 2) infection - valACYclovir (VALTREX) 500 MG tablet; Take 1 tablet (500 mg total) by mouth daily.  Dispense: 90 tablet; Refill: 2  4. Paroxysmal atrial fibrillation (HCC) - rivaroxaban (XARELTO) 20 MG TABS tablet; Take 1 tablet (20 mg total) by mouth at bedtime.  Dispense: 90 tablet; Refill: 2 - flecainide (TAMBOCOR) 50 MG tablet; Take 1 tablet (50 mg total) by mouth 2 (two) times daily. (Needs to be seen before next refill)  Dispense: 60 tablet; Refill: 0 - diltiazem (CARDIZEM) 60 MG tablet; TAKE 1/2 (ONE-HALF) TO 1 TABLET EVERY 4 HOURS AS NEEDED FOR HEART RATE GREATER THAN 100 AS LONG AS BLOOD PRESSURE IS GREATER THAN 100  Dispense: 30 tablet; Refill: 2 - diltiazem (CARDIZEM CD) 180 MG 24 hr capsule; Take 1 capsule (180 mg total) by mouth daily.  Dispense: 90 capsule; Refill: 1  5. Atrial fibrillation with RVR (HCC)  6. Essential hypertension, benign  7. Arthritis  8. GAD (generalized anxiety disorder)  9. Vitamin D deficiency   Labs reviewed from hospital Health Maintenance reviewed Diet and exercise encouraged  Follow up plan: 6 months    [name], FNP

## 2019-12-20 ENCOUNTER — Other Ambulatory Visit: Payer: Self-pay | Admitting: Family

## 2019-12-20 DIAGNOSIS — B009 Herpesviral infection, unspecified: Secondary | ICD-10-CM

## 2020-01-07 ENCOUNTER — Ambulatory Visit: Payer: 59

## 2020-01-09 ENCOUNTER — Other Ambulatory Visit: Payer: Self-pay

## 2020-01-09 ENCOUNTER — Ambulatory Visit (INDEPENDENT_AMBULATORY_CARE_PROVIDER_SITE_OTHER): Payer: 59

## 2020-01-09 DIAGNOSIS — Z23 Encounter for immunization: Secondary | ICD-10-CM | POA: Diagnosis not present

## 2020-01-24 ENCOUNTER — Other Ambulatory Visit: Payer: Self-pay | Admitting: Family

## 2020-01-24 DIAGNOSIS — I480 Paroxysmal atrial fibrillation: Secondary | ICD-10-CM

## 2020-06-15 ENCOUNTER — Ambulatory Visit (INDEPENDENT_AMBULATORY_CARE_PROVIDER_SITE_OTHER): Payer: 59 | Admitting: Family

## 2020-06-15 ENCOUNTER — Other Ambulatory Visit: Payer: Self-pay

## 2020-06-15 ENCOUNTER — Encounter: Payer: Self-pay | Admitting: Family

## 2020-06-15 VITALS — BP 119/70 | HR 53 | Temp 97.6°F | Ht 65.0 in | Wt 148.0 lb

## 2020-06-15 DIAGNOSIS — M1990 Unspecified osteoarthritis, unspecified site: Secondary | ICD-10-CM

## 2020-06-15 DIAGNOSIS — F411 Generalized anxiety disorder: Secondary | ICD-10-CM

## 2020-06-15 DIAGNOSIS — E785 Hyperlipidemia, unspecified: Secondary | ICD-10-CM | POA: Diagnosis not present

## 2020-06-15 DIAGNOSIS — M810 Age-related osteoporosis without current pathological fracture: Secondary | ICD-10-CM

## 2020-06-15 DIAGNOSIS — Z0000 Encounter for general adult medical examination without abnormal findings: Secondary | ICD-10-CM

## 2020-06-15 DIAGNOSIS — I480 Paroxysmal atrial fibrillation: Secondary | ICD-10-CM

## 2020-06-15 DIAGNOSIS — B009 Herpesviral infection, unspecified: Secondary | ICD-10-CM

## 2020-06-15 DIAGNOSIS — E559 Vitamin D deficiency, unspecified: Secondary | ICD-10-CM

## 2020-06-15 DIAGNOSIS — Z0001 Encounter for general adult medical examination with abnormal findings: Secondary | ICD-10-CM | POA: Diagnosis not present

## 2020-06-15 DIAGNOSIS — I4819 Other persistent atrial fibrillation: Secondary | ICD-10-CM

## 2020-06-15 DIAGNOSIS — I10 Essential (primary) hypertension: Secondary | ICD-10-CM

## 2020-06-15 MED ORDER — FLECAINIDE ACETATE 50 MG PO TABS: 50.0000 mg | ORAL_TABLET | Freq: Two times a day (BID) | ORAL | 5 refills | Status: DC

## 2020-06-15 MED ORDER — DILTIAZEM HCL ER COATED BEADS 180 MG PO CP24: 180.0000 mg | ORAL_CAPSULE | Freq: Every day | ORAL | 1 refills | Status: DC

## 2020-06-15 MED ORDER — RIVAROXABAN 20 MG PO TABS: 20.0000 mg | ORAL_TABLET | Freq: Every day | ORAL | 2 refills | Status: DC

## 2020-06-15 MED ORDER — ALENDRONATE SODIUM 70 MG PO TABS: ORAL_TABLET | ORAL | 0 refills | Status: DC

## 2020-06-15 MED ORDER — ROSUVASTATIN CALCIUM 20 MG PO TABS: 20.0000 mg | ORAL_TABLET | Freq: Every day | ORAL | 1 refills | Status: DC

## 2020-06-15 MED ORDER — VALACYCLOVIR HCL 500 MG PO TABS: 500.0000 mg | ORAL_TABLET | Freq: Every day | ORAL | 0 refills | Status: DC

## 2020-06-15 NOTE — Progress Notes (Signed)
Subjective:    Patient ID: Kathryn Arnold, female    DOB: 31-Mar-1946, 75 y.o.   MRN: [id number]  Chief Complaint  Patient presents with  . Annual Exam  . Wrist Pain    X 2 mths left wrist     Pt presents to the office today forchronic follow up. Pt is followed by Cardiologists for A Fib and had an ablation on 10/31/16 and 2016.She had cardioversion in 08/21 and reports this was the last time she was in A Fib. She takes Xarelto 20 mg daily.   She is complaining of left wrist that started two months ago. She has an ortho appt tomorrow.  Wrist Pain  The pain is present in the left wrist. This is a new problem. The current episode started more than 1 month ago. The problem occurs intermittently. The pain is at a severity of 8/10. Associated symptoms include stiffness.  Hypertension This is a chronic problem. The current episode started more than 1 year ago. The problem has been waxing and waning since onset. Associated symptoms include anxiety. Pertinent negatives include no malaise/fatigue, peripheral edema or shortness of breath. Risk factors for coronary artery disease include dyslipidemia. The current treatment provides moderate improvement.  Hyperlipidemia This is a chronic problem. The current episode started more than 1 year ago. The problem is controlled. Recent lipid tests were reviewed and are normal. Pertinent negatives include no shortness of breath. Current antihyperlipidemic treatment includes statins. The current treatment provides moderate improvement of lipids. Risk factors for coronary artery disease include dyslipidemia, hypertension and a sedentary lifestyle.  Arthritis Presents for follow-up visit. She complains of pain and stiffness. The symptoms have been stable. Affected locations include the right hip. Her pain is at a severity of 2/10.  Anxiety Presents for follow-up visit. Symptoms include excessive worry and irritability. Patient reports no panic or shortness of  breath. Symptoms occur rarely.    Herpes  Takes Valtrex daily. States her last outbreak was over 20 years ago.     Review of Systems  Constitutional: Positive for irritability. Negative for malaise/fatigue.  Respiratory: Negative for shortness of breath.   Musculoskeletal: Positive for arthritis and stiffness.  All other systems reviewed and are negative.  Family History  Problem Relation Age of Onset  . Osteoporosis Mother   . Cancer Mother        breast  . Hypertension Mother   . Heart disease Father   . Other Neg Hx    Social History   Socioeconomic History  . Marital status: Married    Spouse name: Not on file  . Number of children: Not on file  . Years of education: Not on file  . Highest education level: Not on file  Occupational History  . Not on file  Tobacco Use  . Smoking status: Never Smoker  . Smokeless tobacco: Never Used  Vaping Use  . Vaping Use: Never used  Substance and Sexual Activity  . Alcohol use: No  . Drug use: No  . Sexual activity: Not Currently    Birth control/protection: None  Other Topics Concern  . Not on file  Social History Narrative   Lives in [place] with spouse.   Social Determinants of Health   Financial Resource Strain: Not on file  Food Insecurity: Not on file  Transportation Needs: Not on file  Physical Activity: Not on file  Stress: Not on file  Social Connections: Not on file       Objective:  Physical Exam Vitals reviewed.  Constitutional:      General: She is not in acute distress.    Appearance: She is well-developed and well-nourished.  HENT:     Head: Normocephalic and atraumatic.     Right Ear: External ear normal.     Mouth/Throat:     Mouth: Oropharynx is clear and moist.  Eyes:     Pupils: Pupils are equal, round, and reactive to light.  Neck:     Thyroid: No thyromegaly.  Cardiovascular:     Rate and Rhythm: Normal rate and regular rhythm.     Pulses: Intact distal pulses.     Heart  sounds: Normal heart sounds. No murmur heard.   Pulmonary:     Effort: Pulmonary effort is normal. No respiratory distress.     Breath sounds: Normal breath sounds. No wheezing.  Abdominal:     General: Bowel sounds are normal. There is no distension.     Palpations: Abdomen is soft.     Tenderness: There is no abdominal tenderness.  Musculoskeletal:        General: Swelling (left medial wrist swelling) present. No tenderness or edema. Normal range of motion.     Cervical back: Normal range of motion and neck supple.  Skin:    General: Skin is warm and dry.  Neurological:     Mental Status: She is alert and oriented to person, place, and time.     Cranial Nerves: No cranial nerve deficit.     Deep Tendon Reflexes: Reflexes are normal and symmetric.  Psychiatric:        Mood and Affect: Mood and affect normal.        Behavior: Behavior normal.        Thought Content: Thought content normal.        Judgment: Judgment normal.       BP (!) 146/70   Pulse (!) 53   Temp 97.6 F (36.4 C) (Temporal)   Ht $R'5\' 5"'KR$  (1.651 m)   Wt 148 lb (67.1 kg)   BMI 24.63 kg/m      Assessment & Plan:  Kathryn Arnold comes in today with chief complaint of Annual Exam and Wrist Pain (X 2 mths left wrist /)   Diagnosis and orders addressed:  1. HSV-2 (herpes simplex virus 2) infection - valACYclovir (VALTREX) 500 MG tablet; Take 1 tablet (500 mg total) by mouth daily.  Dispense: 90 tablet; Refill: 0  2. Hyperlipidemia with target LDL less than 100 - rosuvastatin (CRESTOR) 20 MG tablet; Take 1 tablet (20 mg total) by mouth at bedtime.  Dispense: 90 tablet; Refill: 1  3. Osteoporosis, unspecified osteoporosis type, unspecified pathological fracture presence - alendronate (FOSAMAX) 70 MG tablet; TAKE 1 TABLET BY MOUTH ONCE A WEEK WITH A FULL GLASS OF WATER ON AN EMPTY STOMACH  Dispense: 12 tablet; Refill: 0  4. Paroxysmal atrial fibrillation (HCC) - rivaroxaban (XARELTO) 20 MG TABS tablet; Take  1 tablet (20 mg total) by mouth at bedtime.  Dispense: 90 tablet; Refill: 2 - flecainide (TAMBOCOR) 50 MG tablet; Take 1 tablet (50 mg total) by mouth 2 (two) times daily.  Dispense: 60 tablet; Refill: 5 - diltiazem (CARDIZEM CD) 180 MG 24 hr capsule; Take 1 capsule (180 mg total) by mouth daily.  Dispense: 90 capsule; Refill: 1  5. Annual physical exam - CMP14+EGFR - CBC with Differential/Platelet - Lipid panel - TSH  6. Persistent atrial fibrillation ([place])  7. Essential hypertension, benign  8. Arthritis  9. Vitamin D deficiency  10. GAD (generalized anxiety disorder)   Labs pending Health Maintenance reviewed Diet and exercise encouraged  Follow up plan: 6 months    Evelina Dun, FNP

## 2020-06-15 NOTE — Patient Instructions (Signed)
Health Maintenance After Age 73 After age 73, you are at a higher risk for certain long-term diseases and infections as well as injuries from falls. Falls are a major cause of broken bones and head injuries in people who are older than age 73. Getting regular preventive care can help to keep you healthy and well. Preventive care includes getting regular testing and making lifestyle changes as recommended by your health care provider. Talk with your health care provider about:  Which screenings and tests you should have. A screening is a test that checks for a disease when you have no symptoms.  A diet and exercise plan that is right for you. What should I know about screenings and tests to prevent falls? Screening and testing are the best ways to find a health problem early. Early diagnosis and treatment give you the best chance of managing medical conditions that are common after age 73. Certain conditions and lifestyle choices may make you more likely to have a fall. Your health care provider may recommend:  Regular vision checks. Poor vision and conditions such as cataracts can make you more likely to have a fall. If you wear glasses, make sure to get your prescription updated if your vision changes.  Medicine review. Work with your health care provider to regularly review all of the medicines you are taking, including over-the-counter medicines. Ask your health care provider about any side effects that may make you more likely to have a fall. Tell your health care provider if any medicines that you take make you feel dizzy or sleepy.  Osteoporosis screening. Osteoporosis is a condition that causes the bones to get weaker. This can make the bones weak and cause them to break more easily.  Blood pressure screening. Blood pressure changes and medicines to control blood pressure can make you feel dizzy.  Strength and balance checks. Your health care provider may recommend certain tests to check your  strength and balance while standing, walking, or changing positions.  Foot health exam. Foot pain and numbness, as well as not wearing proper footwear, can make you more likely to have a fall.  Depression screening. You may be more likely to have a fall if you have a fear of falling, feel emotionally low, or feel unable to do activities that you used to do.  Alcohol use screening. Using too much alcohol can affect your balance and may make you more likely to have a fall. What actions can I take to lower my risk of falls? General instructions  Talk with your health care provider about your risks for falling. Tell your health care provider if: ? You fall. Be sure to tell your health care provider about all falls, even ones that seem minor. ? You feel dizzy, sleepy, or off-balance.  Take over-the-counter and prescription medicines only as told by your health care provider. These include any supplements.  Eat a healthy diet and maintain a healthy weight. A healthy diet includes low-fat dairy products, low-fat (lean) meats, and fiber from whole grains, beans, and lots of fruits and vegetables. Home safety  Remove any tripping hazards, such as rugs, cords, and clutter.  Install safety equipment such as grab bars in bathrooms and safety rails on stairs.  Keep rooms and walkways well-lit. Activity  Follow a regular exercise program to stay fit. This will help you maintain your balance. Ask your health care provider what types of exercise are appropriate for you.  If you need a cane or walker,   use it as recommended by your health care provider.  Wear supportive shoes that have nonskid soles.   Lifestyle  Do not drink alcohol if your health care provider tells you not to drink.  If you drink alcohol, limit how much you have: ? 0-1 drink a day for women. ? 0-2 drinks a day for men.  Be aware of how much alcohol is in your drink. In the [place] one drink equals one typical bottle of beer (12  oz), one-half glass of wine (5 oz), or one shot of hard liquor (1 oz).  Do not use any products that contain nicotine or tobacco, such as cigarettes and e-cigarettes. If you need help quitting, ask your health care provider. Summary  Having a healthy lifestyle and getting preventive care can help to protect your health and wellness after age 73.  Screening and testing are the best way to find a health problem early and help you avoid having a fall. Early diagnosis and treatment give you the best chance for managing medical conditions that are more common for people who are older than age 73.  Falls are a major cause of broken bones and head injuries in people who are older than age 73. Take precautions to prevent a fall at home.  Work with your health care provider to learn what changes you can make to improve your health and wellness and to prevent falls. This information is not intended to replace advice given to you by your health care provider. Make sure you discuss any questions you have with your health care provider. Document Revised: 08/01/2018 Document Reviewed: 02/21/2017 Elsevier Patient Education  2021 [employer] Inc.  

## 2020-06-16 LAB — CMP14+EGFR
ALT: 17 IU/L (ref 0–32)
AST: 25 IU/L (ref 0–40)
Albumin/Globulin Ratio: 1.8 (ref 1.2–2.2)
Albumin: 4.6 g/dL (ref 3.7–4.7)
Alkaline Phosphatase: 47 IU/L (ref 44–121)
BUN/Creatinine Ratio: 16 (ref 12–28)
BUN: 13 mg/dL (ref 8–27)
Bilirubin Total: 0.5 mg/dL (ref 0.0–1.2)
CO2: 22 mmol/L (ref 20–29)
Calcium: 9.4 mg/dL (ref 8.7–10.3)
Chloride: 102 mmol/L (ref 96–106)
Creatinine, Ser: 0.81 mg/dL (ref 0.57–1.00)
GFR calc Af Amer: 84 mL/min/{1.73_m2} (ref >59)
GFR calc non Af Amer: 73 mL/min/{1.73_m2} (ref >59)
Globulin, Total: 2.5 g/dL (ref 1.5–4.5)
Glucose: 100 mg/dL — ABNORMAL HIGH (ref 65–99)
Potassium: 4.6 mmol/L (ref 3.5–5.2)
Sodium: 140 mmol/L (ref 134–144)
Total Protein: 7.1 g/dL (ref 6.0–8.5)

## 2020-06-16 LAB — CBC WITH DIFFERENTIAL/PLATELET
Basophils Absolute: 0.1 10*3/uL (ref 0.0–0.2)
Basos: 1 %
EOS (ABSOLUTE): 0 10*3/uL (ref 0.0–0.4)
Eos: 1 %
Hematocrit: 43.4 % (ref 34.0–46.6)
Hemoglobin: 14.6 g/dL (ref 11.1–15.9)
Immature Grans (Abs): 0 10*3/uL (ref 0.0–0.1)
Immature Granulocytes: 0 %
Lymphocytes Absolute: 1.8 10*3/uL (ref 0.7–3.1)
Lymphs: 36 %
MCH: 30.5 pg (ref 26.6–33.0)
MCHC: 33.6 g/dL (ref 31.5–35.7)
MCV: 91 fL (ref 79–97)
Monocytes Absolute: 0.4 10*3/uL (ref 0.1–0.9)
Monocytes: 8 %
Neutrophils Absolute: 2.6 10*3/uL (ref 1.4–7.0)
Neutrophils: 54 %
Platelets: 219 10*3/uL (ref 150–450)
RBC: 4.79 x10E6/uL (ref 3.77–5.28)
RDW: 12.3 % (ref 11.7–15.4)
WBC: 4.9 10*3/uL (ref 3.4–10.8)

## 2020-06-16 LAB — TSH: TSH: 1.44 u[IU]/mL (ref 0.450–4.500)

## 2020-06-16 LAB — LIPID PANEL
Chol/HDL Ratio: 3 ratio (ref 0.0–4.4)
Cholesterol, Total: 164 mg/dL (ref 100–199)
HDL: 55 mg/dL (ref >39)
LDL Chol Calc (NIH): 92 mg/dL (ref 0–99)
Triglycerides: 95 mg/dL (ref 0–149)
VLDL Cholesterol Cal: 17 mg/dL (ref 5–40)

## 2020-06-17 ENCOUNTER — Other Ambulatory Visit: Payer: Self-pay | Admitting: Family

## 2020-06-18 ENCOUNTER — Other Ambulatory Visit: Payer: Self-pay | Admitting: Family

## 2020-06-18 ENCOUNTER — Telehealth: Payer: Self-pay

## 2020-06-18 NOTE — Telephone Encounter (Signed)
Patient aware of lab results and verbalizes understanding.  

## 2020-07-19 ENCOUNTER — Other Ambulatory Visit: Payer: Self-pay | Admitting: Family

## 2020-07-19 DIAGNOSIS — I480 Paroxysmal atrial fibrillation: Secondary | ICD-10-CM

## 2020-07-20 ENCOUNTER — Other Ambulatory Visit: Payer: Self-pay | Admitting: Family

## 2020-07-20 DIAGNOSIS — I480 Paroxysmal atrial fibrillation: Secondary | ICD-10-CM

## 2020-09-16 ENCOUNTER — Other Ambulatory Visit: Payer: Self-pay | Admitting: Family

## 2020-09-16 DIAGNOSIS — B009 Herpesviral infection, unspecified: Secondary | ICD-10-CM

## 2020-09-21 ENCOUNTER — Other Ambulatory Visit: Payer: Self-pay | Admitting: Family

## 2020-09-21 DIAGNOSIS — M810 Age-related osteoporosis without current pathological fracture: Secondary | ICD-10-CM

## 2020-12-13 ENCOUNTER — Other Ambulatory Visit: Payer: Self-pay | Admitting: Family

## 2020-12-13 DIAGNOSIS — M810 Age-related osteoporosis without current pathological fracture: Secondary | ICD-10-CM

## 2020-12-13 DIAGNOSIS — E785 Hyperlipidemia, unspecified: Secondary | ICD-10-CM

## 2020-12-16 ENCOUNTER — Encounter: Payer: Self-pay | Admitting: Family

## 2020-12-16 ENCOUNTER — Ambulatory Visit: Payer: 59 | Admitting: Family

## 2020-12-16 ENCOUNTER — Other Ambulatory Visit: Payer: Self-pay

## 2020-12-16 VITALS — BP 136/74 | HR 55 | Temp 97.8°F | Ht 65.0 in | Wt 142.4 lb

## 2020-12-16 DIAGNOSIS — E785 Hyperlipidemia, unspecified: Secondary | ICD-10-CM | POA: Diagnosis not present

## 2020-12-16 DIAGNOSIS — M810 Age-related osteoporosis without current pathological fracture: Secondary | ICD-10-CM

## 2020-12-16 DIAGNOSIS — I4891 Unspecified atrial fibrillation: Secondary | ICD-10-CM

## 2020-12-16 DIAGNOSIS — F411 Generalized anxiety disorder: Secondary | ICD-10-CM

## 2020-12-16 DIAGNOSIS — M1990 Unspecified osteoarthritis, unspecified site: Secondary | ICD-10-CM

## 2020-12-16 DIAGNOSIS — B009 Herpesviral infection, unspecified: Secondary | ICD-10-CM | POA: Diagnosis not present

## 2020-12-16 DIAGNOSIS — I10 Essential (primary) hypertension: Secondary | ICD-10-CM

## 2020-12-16 DIAGNOSIS — I480 Paroxysmal atrial fibrillation: Secondary | ICD-10-CM

## 2020-12-16 LAB — CMP14+EGFR
ALT: 17 IU/L (ref 0–32)
AST: 23 IU/L (ref 0–40)
Albumin/Globulin Ratio: 1.7 (ref 1.2–2.2)
Albumin: 4.5 g/dL (ref 3.7–4.7)
Alkaline Phosphatase: 45 IU/L (ref 44–121)
BUN/Creatinine Ratio: 16 (ref 12–28)
BUN: 14 mg/dL (ref 8–27)
Bilirubin Total: 0.4 mg/dL (ref 0.0–1.2)
CO2: 25 mmol/L (ref 20–29)
Calcium: 9.7 mg/dL (ref 8.7–10.3)
Chloride: 104 mmol/L (ref 96–106)
Creatinine, Ser: 0.86 mg/dL (ref 0.57–1.00)
Globulin, Total: 2.6 g/dL (ref 1.5–4.5)
Glucose: 104 mg/dL — ABNORMAL HIGH (ref 65–99)
Potassium: 4.9 mmol/L (ref 3.5–5.2)
Sodium: 143 mmol/L (ref 134–144)
Total Protein: 7.1 g/dL (ref 6.0–8.5)
eGFR: 71 mL/min/{1.73_m2} (ref >59)

## 2020-12-16 MED ORDER — FLECAINIDE ACETATE 50 MG PO TABS: 50.0000 mg | ORAL_TABLET | Freq: Two times a day (BID) | ORAL | 5 refills | Status: DC

## 2020-12-16 MED ORDER — ROSUVASTATIN CALCIUM 20 MG PO TABS: 20.0000 mg | ORAL_TABLET | Freq: Every day | ORAL | 1 refills | Status: DC

## 2020-12-16 MED ORDER — ALENDRONATE SODIUM 70 MG PO TABS
ORAL_TABLET | ORAL | 0 refills | Status: DC
Start: 2020-12-16 — End: 2021-05-23

## 2020-12-16 MED ORDER — DILTIAZEM HCL ER COATED BEADS 180 MG PO CP24: 180.0000 mg | ORAL_CAPSULE | Freq: Every day | ORAL | 1 refills | Status: DC

## 2020-12-16 MED ORDER — VALACYCLOVIR HCL 500 MG PO TABS: 500.0000 mg | ORAL_TABLET | Freq: Every day | ORAL | 2 refills | Status: DC

## 2020-12-16 MED ORDER — RIVAROXABAN 20 MG PO TABS: 20.0000 mg | ORAL_TABLET | Freq: Every day | ORAL | 2 refills | Status: DC

## 2020-12-16 NOTE — Patient Instructions (Signed)

## 2020-12-16 NOTE — Progress Notes (Signed)
Subjective:    Patient ID: Kathryn Arnold, female    DOB: 01-Nov-1945, 75 y.o.   MRN: [id number]  Chief Complaint  Patient presents with   Medical Management of Chronic Issues    Pt presents to the office today for chronic follow up . Pt is followed by Cardiologists for A Fib  as needed. Had an ablation on 10/31/16 and 2016.  She had cardioversion in 08/21 and reports this was the last time she was in A Fib. She takes Xarelto 20 mg daily.    Hypertension This is a chronic problem. The current episode started more than 1 year ago. The problem has been resolved since onset. The problem is controlled. Associated symptoms include anxiety and malaise/fatigue. Pertinent negatives include no peripheral edema or shortness of breath. Risk factors for coronary artery disease include dyslipidemia. The current treatment provides moderate improvement.  Arthritis Presents for follow-up visit. She complains of pain and stiffness. The symptoms have been stable. Affected locations include the left knee, right knee, right wrist and left wrist. Her pain is at a severity of 0/10.  Hyperlipidemia This is a chronic problem. The current episode started more than 1 year ago. The problem is controlled. Recent lipid tests were reviewed and are normal. Pertinent negatives include no shortness of breath. Current antihyperlipidemic treatment includes statins. The current treatment provides moderate improvement of lipids. Risk factors for coronary artery disease include dyslipidemia, hypertension, a sedentary lifestyle and post-menopausal.  Anxiety Presents for follow-up visit. Symptoms include excessive worry and nervous/anxious behavior. Patient reports no irritability, restlessness or shortness of breath. Symptoms occur occasionally. The severity of symptoms is moderate.   Herpes Simplex  Takes Valtrex daily. Has not had a breakout in years.    Review of Systems  Constitutional:  Positive for malaise/fatigue.  Negative for irritability.  Respiratory:  Negative for shortness of breath.   Musculoskeletal:  Positive for arthritis and stiffness.  Psychiatric/Behavioral:  The patient is nervous/anxious.   All other systems reviewed and are negative.     Objective:   Physical Exam Vitals reviewed.  Constitutional:      General: She is not in acute distress.    Appearance: She is well-developed.  HENT:     Head: Normocephalic and atraumatic.     Right Ear: Tympanic membrane normal.     Left Ear: Tympanic membrane normal.  Eyes:     Pupils: Pupils are equal, round, and reactive to light.  Neck:     Thyroid: No thyromegaly.  Cardiovascular:     Rate and Rhythm: Normal rate and regular rhythm.     Heart sounds: Normal heart sounds. No murmur heard. Pulmonary:     Effort: Pulmonary effort is normal. No respiratory distress.     Breath sounds: Normal breath sounds. No wheezing.  Abdominal:     General: Bowel sounds are normal. There is no distension.     Palpations: Abdomen is soft.     Tenderness: There is no abdominal tenderness.  Musculoskeletal:        General: No tenderness. Normal range of motion.     Cervical back: Normal range of motion and neck supple.  Skin:    General: Skin is warm and dry.  Neurological:     Mental Status: She is alert and oriented to person, place, and time.     Cranial Nerves: No cranial nerve deficit.     Deep Tendon Reflexes: Reflexes are normal and symmetric.  Psychiatric:  Behavior: Behavior normal.        Thought Content: Thought content normal.        Judgment: Judgment normal.      BP 136/74   Pulse (!) 55   Temp 97.8 F (36.6 C) (Temporal)   Ht _0  (1.651 m)   Wt 142 lb 6.4 oz (64.6 kg)   BMI 23.70 kg/m      Assessment & Plan:  Kathryn Arnold comes in today with chief complaint of Medical Management of Chronic Issues   Diagnosis and orders addressed:  1. Hyperlipidemia with target LDL less than 100 - rosuvastatin (CRESTOR)  20 MG tablet; Take 1 tablet (20 mg total) by mouth at bedtime.  Dispense: 90 tablet; Refill: 1 - CMP14+EGFR  2. Osteoporosis, unspecified osteoporosis type, unspecified pathological fracture presence - alendronate (FOSAMAX) 70 MG tablet; Take with a full glass of water on an empty stomach.  Dispense: 12 tablet; Refill: 0 - CMP14+EGFR  3. HSV-2 (herpes simplex virus 2) infection - valACYclovir (VALTREX) 500 MG tablet; Take 1 tablet (500 mg total) by mouth daily.  Dispense: 90 tablet; Refill: 2 - CMP14+EGFR  4. Paroxysmal atrial fibrillation (HCC) - rivaroxaban (XARELTO) 20 MG TABS tablet; Take 1 tablet (20 mg total) by mouth at bedtime.  Dispense: 90 tablet; Refill: 2 - diltiazem (CARDIZEM CD) 180 MG 24 hr capsule; Take 1 capsule (180 mg total) by mouth daily.  Dispense: 90 capsule; Refill: 1 - flecainide (TAMBOCOR) 50 MG tablet; Take 1 tablet (50 mg total) by mouth 2 (two) times daily.  Dispense: 60 tablet; Refill: 5 - CMP14+EGFR  5. Atrial fibrillation with RVR (HCC) - CMP14+EGFR  6. Essential hypertension, benign - CMP14+EGFR  7. Arthritis - CMP14+EGFR  8. GAD (generalized anxiety disorder)  - CMP14+EGFR   Labs pending Health Maintenance reviewed Diet and exercise encouraged  Follow up plan: 6 months    Evelina Dun, FNP

## 2021-01-07 ENCOUNTER — Other Ambulatory Visit: Payer: Self-pay

## 2021-01-07 ENCOUNTER — Encounter: Payer: Self-pay | Admitting: Family Medicine

## 2021-01-07 ENCOUNTER — Ambulatory Visit: Payer: 59 | Admitting: Family Medicine

## 2021-01-07 VITALS — BP 157/78 | HR 52 | Temp 97.7°F | Ht 65.0 in | Wt 146.2 lb

## 2021-01-07 DIAGNOSIS — Z23 Encounter for immunization: Secondary | ICD-10-CM | POA: Diagnosis not present

## 2021-01-07 DIAGNOSIS — L659 Nonscarring hair loss, unspecified: Secondary | ICD-10-CM | POA: Diagnosis not present

## 2021-01-07 NOTE — Patient Instructions (Signed)
Biotin for skin, hair, and nails.

## 2021-01-07 NOTE — Progress Notes (Signed)
Assessment & Plan:  1. Hair loss Encouraged to start taking biotin for skin hair and nails.  Discussed if her lab work is normal I can place a referral to dermatology for further work-up. - CBC with Differential/Platelet - CMP14+EGFR - Testosterone,Free and Total - TSH  2. Need for immunization against influenza - Flu Vaccine QUAD High Dose(Fluad) - given in office.   Follow up plan: Return if symptoms worsen or fail to improve.  Hendricks Limes, MSN, APRN, FNP-C [facility]  Subjective:   Patient ID: Kathryn Arnold, female    DOB: 05-30-1945, 75 y.o.   MRN: [id number]  HPI: Kathryn Arnold is a 75 y.o. female presenting on 01/07/2021 for Alopecia (Patient states that she has been going on x 6 months but has gotten worse. )  Patient reports she has been losing a lot of hair over the past 6 months.  She has not been sick or under any significant stress.  Denies any medication changes.  Her daughter encouraged her to come be evaluated.   ROS: Negative unless specifically indicated above in HPI.   Relevant past medical history reviewed and updated as indicated.   Allergies and medications reviewed and updated.   Current Outpatient Medications:    alendronate (FOSAMAX) 70 MG tablet, Take with a full glass of water on an empty stomach., Disp: 12 tablet, Rfl: 0   Ascorbic Acid (VITAMIN C PO), Take one tablet by mouth daily, Disp: , Rfl:    Calcium Citrate-Vitamin D (CITRACAL + D PO), Take 500 mg by mouth 2 (two) times daily., Disp: , Rfl:    diltiazem (CARDIZEM CD) 180 MG 24 hr capsule, Take 1 capsule (180 mg total) by mouth daily., Disp: 90 capsule, Rfl: 1   flecainide (TAMBOCOR) 50 MG tablet, Take 1 tablet (50 mg total) by mouth 2 (two) times daily., Disp: 60 tablet, Rfl: 5   glucosamine-chondroitin 500-400 MG tablet, Take 1 tablet by mouth 3 (three) times daily., Disp: , Rfl:    Magnesium 250 MG TABS, Take 1 tablet by mouth every evening., Disp: , Rfl:     Multiple Vitamins-Minerals (CENTRUM SILVER PO), Take 1 tablet by mouth daily., Disp: , Rfl:    rivaroxaban (XARELTO) 20 MG TABS tablet, Take 1 tablet (20 mg total) by mouth at bedtime., Disp: 90 tablet, Rfl: 2   rosuvastatin (CRESTOR) 20 MG tablet, Take 1 tablet (20 mg total) by mouth at bedtime., Disp: 90 tablet, Rfl: 1   valACYclovir (VALTREX) 500 MG tablet, Take 1 tablet (500 mg total) by mouth daily., Disp: 90 tablet, Rfl: 2   vitamin E 1000 UNIT capsule, Take 1,000 Units by mouth daily., Disp: , Rfl:   No Known Allergies  Objective:   BP (!) 157/78   Pulse (!) 52   Temp 97.7 F (36.5 C) (Temporal)   Ht '5\' 5"'  (1.651 m)   Wt 146 lb 3.2 oz (66.3 kg)   BMI 24.33 kg/m    Physical Exam Vitals reviewed.  Constitutional:      General: She is not in acute distress.    Appearance: Normal appearance. She is not ill-appearing, toxic-appearing or diaphoretic.  HENT:     Head: Normocephalic and atraumatic.     Comments: No bald spots noted. Eyes:     General: No scleral icterus.       Right eye: No discharge.        Left eye: No discharge.     Conjunctiva/sclera: Conjunctivae normal.  Cardiovascular:  Rate and Rhythm: Normal rate.  Pulmonary:     Effort: Pulmonary effort is normal. No respiratory distress.  Musculoskeletal:        General: Normal range of motion.     Cervical back: Normal range of motion.  Skin:    General: Skin is warm and dry.     Capillary Refill: Capillary refill takes less than 2 seconds.  Neurological:     General: No focal deficit present.     Mental Status: She is alert and oriented to person, place, and time. Mental status is at baseline.  Psychiatric:        Mood and Affect: Mood normal.        Behavior: Behavior normal.        Thought Content: Thought content normal.        Judgment: Judgment normal.

## 2021-01-10 LAB — CBC WITH DIFFERENTIAL/PLATELET
Basophils Absolute: 0.1 10*3/uL (ref 0.0–0.2)
Basos: 1 %
EOS (ABSOLUTE): 0.1 10*3/uL (ref 0.0–0.4)
Eos: 2 %
Hematocrit: 43.8 % (ref 34.0–46.6)
Hemoglobin: 14.7 g/dL (ref 11.1–15.9)
Immature Grans (Abs): 0 10*3/uL (ref 0.0–0.1)
Immature Granulocytes: 0 %
Lymphocytes Absolute: 1.4 10*3/uL (ref 0.7–3.1)
Lymphs: 31 %
MCH: 30.2 pg (ref 26.6–33.0)
MCHC: 33.6 g/dL (ref 31.5–35.7)
MCV: 90 fL (ref 79–97)
Monocytes Absolute: 0.4 10*3/uL (ref 0.1–0.9)
Monocytes: 8 %
Neutrophils Absolute: 2.5 10*3/uL (ref 1.4–7.0)
Neutrophils: 58 %
Platelets: 225 10*3/uL (ref 150–450)
RBC: 4.87 x10E6/uL (ref 3.77–5.28)
RDW: 11.9 % (ref 11.7–15.4)
WBC: 4.4 10*3/uL (ref 3.4–10.8)

## 2021-01-10 LAB — CMP14+EGFR
ALT: 18 IU/L (ref 0–32)
AST: 22 IU/L (ref 0–40)
Albumin/Globulin Ratio: 2.1 (ref 1.2–2.2)
Albumin: 4.6 g/dL (ref 3.7–4.7)
Alkaline Phosphatase: 44 IU/L (ref 44–121)
BUN/Creatinine Ratio: 17 (ref 12–28)
BUN: 14 mg/dL (ref 8–27)
Bilirubin Total: 0.4 mg/dL (ref 0.0–1.2)
CO2: 21 mmol/L (ref 20–29)
Calcium: 9.6 mg/dL (ref 8.7–10.3)
Chloride: 103 mmol/L (ref 96–106)
Creatinine, Ser: 0.82 mg/dL (ref 0.57–1.00)
Globulin, Total: 2.2 g/dL (ref 1.5–4.5)
Glucose: 116 mg/dL — ABNORMAL HIGH (ref 65–99)
Potassium: 4.2 mmol/L (ref 3.5–5.2)
Sodium: 142 mmol/L (ref 134–144)
Total Protein: 6.8 g/dL (ref 6.0–8.5)
eGFR: 75 mL/min/{1.73_m2} (ref >59)

## 2021-01-10 LAB — TESTOSTERONE,FREE AND TOTAL
Testosterone, Free: <0.2 pg/mL (ref 0.0–4.2)
Testosterone: 11 ng/dL (ref 3–67)

## 2021-01-10 LAB — TSH: TSH: 1.37 u[IU]/mL (ref 0.450–4.500)

## 2021-01-13 ENCOUNTER — Telehealth: Payer: Self-pay | Admitting: Family

## 2021-01-13 DIAGNOSIS — L659 Nonscarring hair loss, unspecified: Secondary | ICD-10-CM

## 2021-01-13 NOTE — Telephone Encounter (Signed)
Referral pending.

## 2021-01-13 NOTE — Telephone Encounter (Signed)
Pt called to get lab results. Reviewed results with pt per providers notes. Pt voiced understanding and would like dermatology referral to be sent to Dr Loleta Chance in [place].

## 2021-01-13 NOTE — Telephone Encounter (Signed)
Correction to previous message- wants to be referred to see Dr Margo Aye in [place].

## 2021-01-16 ENCOUNTER — Other Ambulatory Visit: Payer: Self-pay | Admitting: Family

## 2021-01-16 DIAGNOSIS — I480 Paroxysmal atrial fibrillation: Secondary | ICD-10-CM

## 2021-01-18 ENCOUNTER — Ambulatory Visit: Payer: 59 | Admitting: Nurse Practitioner

## 2021-01-19 ENCOUNTER — Ambulatory Visit: Payer: 59

## 2021-02-16 ENCOUNTER — Ambulatory Visit: Payer: 59 | Admitting: Family Medicine

## 2021-02-16 ENCOUNTER — Other Ambulatory Visit: Payer: Self-pay

## 2021-02-16 ENCOUNTER — Encounter: Payer: Self-pay | Admitting: Family Medicine

## 2021-02-16 VITALS — BP 145/63 | HR 55 | Temp 97.9°F | Ht 65.0 in | Wt 148.2 lb

## 2021-02-16 DIAGNOSIS — N8110 Cystocele, unspecified: Secondary | ICD-10-CM

## 2021-02-16 NOTE — Patient Instructions (Signed)
Pelvic Organ Prolapse Pelvic organ prolapse is a condition in women that involves the stretching, bulging, or dropping of pelvic organs into an abnormal position, past the opening of the vagina. It happens when the muscles and tissues that surround and support pelvic structures become weak or stretched. Pelvic organ prolapse can involve the: Vagina (vaginal prolapse). Uterus (uterine prolapse). Bladder (cystocele). Rectum (rectocele). Intestines (enterocele). When organs other than the vagina are involved, they often bulge into thevagina or protrude from the vagina, depending on how severe the prolapse is. What are the causes? This condition may be caused by: Pregnancy, labor, and childbirth. Past pelvic surgery. Lower levels of the hormone estrogen due to menopause. Consistently lifting more than 50 lb (23 kg). Obesity. Long-term difficulty passing stool (chronic constipation). Long-term, or chronic, cough. Fluid buildup in the abdomen due to certain conditions. What are the signs or symptoms? Symptoms of this condition include: Leaking a little urine (loss of bladder control) when you cough, sneeze, strain, and exercise (stress incontinence). This may be worse immediately after childbirth. It may gradually improve over time. Feeling pressure in your pelvis or vagina. This pressure may increase when you cough or when you are passing stool. A bulge that protrudes from the opening of your vagina. Difficulty passing urine or stool. Pain in your lower back. Pain or discomfort during sex, or decreased interest in sex. Repeated bladder infections (urinary tract infections). Difficulty inserting a tampon. In some people, this condition causes no symptoms. How is this diagnosed? This condition may be diagnosed based on a vaginal and rectal exam. During the exam, you may be asked to cough and strain while you are lying down, sitting, and standing up. Your health care provider will determine if  other tests arerequired, such as bladder function tests. How is this treated? Treatment for this condition may depend on your symptoms. Treatment may include: Lifestyle changes, such as drinking plenty of fluids and eating foods that are high in fiber. Emptying your bladder at scheduled times (bladder training therapy). This can help reduce or avoid urinary incontinence. Estrogen. This may help mild prolapse by increasing the strength and tone of pelvic floor muscles. Kegel exercises. These may help mild cases of prolapse by strengthening and tightening the muscles of the pelvic floor. A soft, flexible device that helps support the vaginal walls and keep pelvic organs in place (pessary). This is inserted into your vagina by your health care provider. Surgery. This is often the only form of treatment for severe prolapse. Follow these instructions at home: Eating and drinking Avoid drinking beverages that contain caffeine or alcohol. Increase your intake of high-fiber foods to decrease constipation and straining during bowel movements. Activity Lose weight if recommended by your health care provider. Avoid heavy lifting and straining with exercise and work. Do not hold your breath when you perform mild to moderate lifting and exercise activities. Limit your activities as directed by your health care provider. Do Kegel exercises as directed by your health care provider. To do this: Squeeze your pelvic floor muscles tight. You should feel a tight lift in your rectal area and a tightness in your vaginal area. Keep your stomach, buttocks, and legs relaxed. Hold the muscles tight for up to 10 seconds. Then relax your muscles. Repeat this exercise 50 times a day, or as much as told by your health care provider. Continue to do this exercise for at least 4-6 weeks, or for as long as told by your health care provider. General instructions   Take over-the-counter and prescription medicines only as told by  your health care provider. Wear a sanitary pad or adult diapers if you have urinary incontinence. If you have a pessary, take care of it as told by your health care provider. Keep all follow-up visits. This is important. Contact a health care provider if you: Have symptoms that interfere with your daily activities or sex life. Need medicine to help with the discomfort. Notice bleeding from your vagina that is not related to your menstrual period. Have a fever. Have pain or bleeding when you urinate. Have bleeding when you pass stool. Pass urine when you have sex. Have chronic constipation. Have a pessary that falls out. Have a foul-smelling vaginal discharge. Have an unusual, low pain in your abdomen. Get help right away if you: Cannot pass urine. Summary Pelvic organ prolapse is the stretching, bulging, or dropping of pelvic organs into an abnormal position. It happens when the muscles and tissues that surround and support pelvic structures become weak or stretched. When organs other than the vagina are involved, they often bulge into the vagina or protrude from it, depending on how severe the prolapse is. In most cases, this condition needs to be treated only if it produces symptoms. Treatment may include lifestyle changes, estrogen, Kegel exercises, pessary insertion, or surgery. Avoid heavy lifting and straining with exercise and work. Do not hold your breath when you perform mild to moderate lifting and exercise activities. Limit your activities as directed by your health care provider. This information is not intended to replace advice given to you by your health care provider. Make sure you discuss any questions you have with your healthcare provider. Document Revised: 10/06/2019 Document Reviewed: 10/06/2019 Elsevier Patient Education  2022 [employer] Inc.  

## 2021-02-16 NOTE — Progress Notes (Signed)
Acute Office Visit  Subjective:    Patient ID: Kathryn Arnold, female    DOB: [birth year], 75 y.o.   MRN: [id number]  Chief Complaint  Patient presents with   Vaginal Prolapse    HPI Patient is in today for possible vaginal prolapse. She has had a lot of pressure for the last 6 month and has felt and seen a bulge in her vagina for at least 6 months. It has been worse for last 6 months with increased pressure and a larger area that has been visualized. She denies pain, fever, chills, changes in bladder or bowel control. She is not established with GYN. She is nervous about having an exam because she tears easily with exams. She reports an intact uterus.   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   HSV-2 (herpes simplex virus 2) infection    Hypercholesteremia    Hyperlipidemia with target LDL less than 100    Hypertension    Osteoporosis, unspecified    Last DEXA 10/2011    Paroxysmal atrial fibrillation (HCC)    s/p ablation Afib and flutter 11/03/14   PONV (postoperative nausea and vomiting)    Vitamin D deficiency     Past Surgical History:  Procedure Laterality Date   ATRIAL FIBRILLATION ABLATION N/A 10/31/2016   Procedure: Atrial Fibrillation Ablation;  Surgeon: Thompson Grayer, MD;  Location: [facility];  Service: Cardiovascular;  Laterality: N/A;   BREAST LUMPECTOMY Left    DILATION AND CURETTAGE OF UTERUS     ELECTROPHYSIOLOGIC STUDY N/A 11/03/2014   Procedure: Atrial Fibrillation Ablation;  Surgeon: Thompson Grayer, MD;  Location: [facility];  Service: Cardiovascular;  Laterality: N/A;   implantable loop recorder placement  03/08/2018   [employer] Reveal LINQ implantation by Dr Rayann Heman for afib management post ablation   LAPAROSCOPY ABDOMEN DIAGNOSTIC  1990s   LOOP RECORDER REMOVAL   09/17/2019   MDT Reveal LINQ removed in office by Dr Rayann Heman   TEE WITHOUT CARDIOVERSION N/A 10/30/2016   Procedure: TRANSESOPHAGEAL ECHOCARDIOGRAM (TEE);  Surgeon: Sanda Klein, MD;  Location: [facility] END[facility];  Service: Cardiovascular;  Laterality: N/A;   TUBAL LIGATION  1978    Family History  Problem Relation Age of Onset   Osteoporosis Mother    Cancer Mother        breast   Hypertension Mother    Heart disease Father    Other Neg Hx     Social History   Socioeconomic History   Marital status: Married    Spouse name: Not on file   Number of children: Not on file   Years of education: Not on file   Highest education level: Not on file  Occupational History   Not on file  Tobacco Use   Smoking status: Never   Smokeless tobacco: Never  Vaping Use   Vaping Use: Never used  Substance and Sexual Activity   Alcohol use: No   Drug use: No   Sexual activity: Not Currently    Birth control/protection: None  Other Topics Concern   Not on file  Social History Narrative   Lives in [place] with spouse.   Social Determinants of Health   Financial Resource Strain: Not on file  Food Insecurity: Not on file  Transportation Needs: Not  on file  Physical Activity: Not on file  Stress: Not on file  Social Connections: Not on file  Intimate Partner Violence: Not on file    Outpatient Medications Prior to Visit  Medication Sig Dispense Refill   alendronate (FOSAMAX) 70 MG tablet Take with a full glass of water on an empty stomach. 12 tablet 0   Ascorbic Acid (VITAMIN C PO) Take one tablet by mouth daily     Calcium Citrate-Vitamin D (CITRACAL + D PO) Take 500 mg by mouth 2 (two) times daily.     diltiazem (CARDIZEM CD) 180 MG 24 hr capsule Take 1 capsule (180 mg total) by mouth daily. 90 capsule 1   flecainide (TAMBOCOR) 50 MG tablet Take 1 tablet by mouth twice daily 180 tablet 1   glucosamine-chondroitin 500-400 MG tablet Take 1 tablet by mouth 3 (three) times daily.      Magnesium 250 MG TABS Take 1 tablet by mouth every evening.     Multiple Vitamins-Minerals (CENTRUM SILVER PO) Take 1 tablet by mouth daily.     rivaroxaban (XARELTO) 20 MG TABS tablet Take 1 tablet (20 mg total) by mouth at bedtime. 90 tablet 2   rosuvastatin (CRESTOR) 20 MG tablet Take 1 tablet (20 mg total) by mouth at bedtime. 90 tablet 1   valACYclovir (VALTREX) 500 MG tablet Take 1 tablet (500 mg total) by mouth daily. 90 tablet 2   vitamin E 1000 UNIT capsule Take 1,000 Units by mouth daily.     No facility-administered medications prior to visit.    No Known Allergies  Review of Systems As per HPI.     Objective:    Physical Exam Exam conducted with a chaperone present.  Pulmonary:     Effort: Pulmonary effort is normal. No respiratory distress.  Genitourinary:    Exam position: Lithotomy position.     Vagina: Prolapsed vaginal walls present.     Comments: Vaginal prolapse visualized in lithotomy position. Tenderness present. Tissue appear well perfused. No bleeding noted.  Skin:    General: Skin is warm and dry.  Neurological:     General: No focal deficit present.     Mental Status: She is oriented to person, place, and time.  Psychiatric:        Mood and Affect: Mood normal.        Behavior: Behavior normal.    BP (!) 145/63   Pulse (!) 55   Temp 97.9 F (36.6 C) (Temporal)   Ht '5\' 5"'  (1.651 m)   Wt 148 lb 4 oz (67.2 kg)   BMI 24.67 kg/m  Wt Readings from Last 3 Encounters:  02/16/21 148 lb 4 oz (67.2 kg)  01/07/21 146 lb 3.2 oz (66.3 kg)  12/16/20 142 lb 6.4 oz (64.6 kg)    Health Maintenance Due  Topic Date Due   Zoster Vaccines- Shingrix (1 of 2) Never done   COVID-19 Vaccine (4 - Booster for Moderna series) 05/11/2020    There are no preventive care reminders to display for this patient.   Lab Results  Component Value Date   TSH 1.370 01/07/2021   Lab Results  Component Value Date   WBC 4.4 01/07/2021   HGB 14.7 01/07/2021   HCT 43.8  01/07/2021   MCV 90 01/07/2021   PLT 225 01/07/2021   Lab Results  Component Value Date   NA 142 01/07/2021   K 4.2 01/07/2021   CO2 21 01/07/2021   GLUCOSE 116 (H) 01/07/2021  BUN 14 01/07/2021   CREATININE 0.82 01/07/2021   BILITOT 0.4 01/07/2021   ALKPHOS 44 01/07/2021   AST 22 01/07/2021   ALT 18 01/07/2021   PROT 6.8 01/07/2021   ALBUMIN 4.6 01/07/2021   CALCIUM 9.6 01/07/2021   ANIONGAP 12 12/11/2019   EGFR 75 01/07/2021   GFR 68.13 10/27/2014   Lab Results  Component Value Date   CHOL 164 06/15/2020   Lab Results  Component Value Date   HDL 55 06/15/2020   Lab Results  Component Value Date   LDLCALC 92 06/15/2020   Lab Results  Component Value Date   TRIG 95 06/15/2020   Lab Results  Component Value Date   CHOLHDL 3.0 06/15/2020   No results found for: HGBA1C     Assessment & Plan:   Kathryn Arnold was seen today for vaginal prolapse.  Diagnoses and all orders for this visit:  Vaginal prolapse Prolapse visualized today in lithotomy position without speculum exam. Reports increasing pressure over last 3-5 weeks. Denies changes in bowel or bladder control today. Discussed need for referral to GYN for management. Referral placed.  -     Ambulatory referral to Gynecology  Return to office for new or worsening symptoms, or if symptoms persist.   The patient indicates understanding of these issues and agrees with the plan.  Gwenlyn Perking, FNP

## 2021-02-28 ENCOUNTER — Ambulatory Visit: Payer: 59 | Admitting: Family

## 2021-03-29 ENCOUNTER — Ambulatory Visit: Payer: 59 | Admitting: Family

## 2021-05-02 ENCOUNTER — Encounter: Payer: Self-pay | Admitting: Nurse Practitioner

## 2021-05-02 ENCOUNTER — Ambulatory Visit (INDEPENDENT_AMBULATORY_CARE_PROVIDER_SITE_OTHER): Payer: 59 | Admitting: Nurse Practitioner

## 2021-05-02 VITALS — Temp 99.4°F

## 2021-05-02 DIAGNOSIS — Z20822 Contact with and (suspected) exposure to covid-19: Secondary | ICD-10-CM

## 2021-05-02 DIAGNOSIS — J069 Acute upper respiratory infection, unspecified: Secondary | ICD-10-CM | POA: Insufficient documentation

## 2021-05-02 LAB — VERITOR FLU A/B WAIVED
Influenza A: NEGATIVE
Influenza B: NEGATIVE

## 2021-05-02 NOTE — Progress Notes (Signed)
° °  Virtual Visit  Note Due to COVID-19 pandemic this visit was conducted virtually. This visit type was conducted due to national recommendations for restrictions regarding the COVID-19 Pandemic (e.g. social distancing, sheltering in place) in an effort to limit this patient's exposure and mitigate transmission in our community. All issues noted in this document were discussed and addressed.  A physical exam was not performed with this format.  I connected with Kathryn Arnold on 05/02/21 at 08:20 am  by telephone and verified that I am speaking with the correct person using two identifiers. Kathryn Arnold is currently located at home during visit. The provider, Daryll Drown, NP is located in their office at time of visit.  I discussed the limitations, risks, security and privacy concerns of performing an evaluation and management service by telephone and the availability of in person appointments. I also discussed with the patient that there may be a patient responsible charge related to this service. The patient expressed understanding and agreed to proceed.   History and Present Illness:  URI  This is a new problem. Episode onset: in the past 3 days. The problem has been unchanged. Maximum temperature: low grade fever of 99.4 lasting for 24 hours. Associated symptoms include congestion and coughing. She has tried nothing for the symptoms.  Patient was exposed to covid-19   Review of Systems  Constitutional:  Positive for malaise/fatigue.  HENT:  Positive for congestion.   Respiratory:  Positive for cough.   Skin: Negative.   All other systems reviewed and are negative.   Observations/Objective: Tele- visit patient is not in distress  Assessment and Plan: Take meds as prescribed - Use a cool mist humidifier  -Use saline nose sprays frequently -Force fluids -For fever or aches or pains- take Tylenol or ibuprofen. -Covid-19 swab and flu swab completed results pending Follow up  with worsening unresolved symptoms   Follow Up Instructions: Follow up with worsening unresolved symptoms    I discussed the assessment and treatment plan with the patient. The patient was provided an opportunity to ask questions and all were answered. The patient agreed with the plan and demonstrated an understanding of the instructions.   The patient was advised to call back or seek an in-person evaluation if the symptoms worsen or if the condition fails to improve as anticipated.  The above assessment and management plan was discussed with the patient. The patient verbalized understanding of and has agreed to the management plan. Patient is aware to call the clinic if symptoms persist or worsen. Patient is aware when to return to the clinic for a follow-up visit. Patient educated on when it is appropriate to go to the emergency department.   Time call ended:  08: 31 am   I provided 11 minutes of  non face-to-face time during this encounter.    Daryll Drown, NP

## 2021-05-03 ENCOUNTER — Telehealth: Payer: Self-pay | Admitting: Family

## 2021-05-03 LAB — NOVEL CORONAVIRUS, NAA: SARS-CoV-2, NAA: DETECTED — AB

## 2021-05-03 LAB — SARS-COV-2, NAA 2 DAY TAT

## 2021-05-03 NOTE — Telephone Encounter (Signed)
I spoke to pt and she wanted to know if her COVID results were back yet and I advised her they were not and to continue otc treatment as discussed during her visit yesterday and pt voiced understanding.

## 2021-05-04 ENCOUNTER — Telehealth: Payer: Self-pay | Admitting: Nurse Practitioner

## 2021-05-04 ENCOUNTER — Other Ambulatory Visit: Payer: Self-pay | Admitting: Nurse Practitioner

## 2021-05-04 MED ORDER — AMOXICILLIN-POT CLAVULANATE 875-125 MG PO TABS: 1.0000 | ORAL_TABLET | Freq: Two times a day (BID) | ORAL | 0 refills | Status: DC

## 2021-05-04 NOTE — Telephone Encounter (Signed)
Pt is aware of provider feedback and voiced understanding. 

## 2021-05-12 ENCOUNTER — Ambulatory Visit (INDEPENDENT_AMBULATORY_CARE_PROVIDER_SITE_OTHER): Payer: 59 | Admitting: Family

## 2021-05-12 NOTE — Progress Notes (Signed)
Called patient and she states she no longer needs advice. Will close encounter.   [name], FNP

## 2021-05-23 ENCOUNTER — Other Ambulatory Visit: Payer: Self-pay | Admitting: *Deleted

## 2021-05-23 DIAGNOSIS — M810 Age-related osteoporosis without current pathological fracture: Secondary | ICD-10-CM

## 2021-05-23 MED ORDER — ALENDRONATE SODIUM 70 MG PO TABS: ORAL_TABLET | ORAL | 0 refills | Status: DC

## 2021-06-17 ENCOUNTER — Ambulatory Visit: Payer: 59 | Admitting: Family

## 2021-06-17 ENCOUNTER — Ambulatory Visit (INDEPENDENT_AMBULATORY_CARE_PROVIDER_SITE_OTHER): Payer: 59 | Admitting: Family

## 2021-06-17 ENCOUNTER — Ambulatory Visit (INDEPENDENT_AMBULATORY_CARE_PROVIDER_SITE_OTHER): Payer: 59

## 2021-06-17 ENCOUNTER — Encounter: Payer: Self-pay | Admitting: Family

## 2021-06-17 VITALS — BP 136/76 | HR 62 | Temp 97.5°F | Ht 65.0 in | Wt 149.5 lb

## 2021-06-17 DIAGNOSIS — I480 Paroxysmal atrial fibrillation: Secondary | ICD-10-CM

## 2021-06-17 DIAGNOSIS — M810 Age-related osteoporosis without current pathological fracture: Secondary | ICD-10-CM

## 2021-06-17 DIAGNOSIS — Z0000 Encounter for general adult medical examination without abnormal findings: Secondary | ICD-10-CM

## 2021-06-17 DIAGNOSIS — I10 Essential (primary) hypertension: Secondary | ICD-10-CM

## 2021-06-17 DIAGNOSIS — E559 Vitamin D deficiency, unspecified: Secondary | ICD-10-CM

## 2021-06-17 DIAGNOSIS — E785 Hyperlipidemia, unspecified: Secondary | ICD-10-CM | POA: Diagnosis not present

## 2021-06-17 DIAGNOSIS — F411 Generalized anxiety disorder: Secondary | ICD-10-CM

## 2021-06-17 DIAGNOSIS — M1990 Unspecified osteoarthritis, unspecified site: Secondary | ICD-10-CM

## 2021-06-17 MED ORDER — ROSUVASTATIN CALCIUM 20 MG PO TABS: 20.0000 mg | ORAL_TABLET | Freq: Every day | ORAL | 1 refills | Status: DC

## 2021-06-17 MED ORDER — FLECAINIDE ACETATE 50 MG PO TABS: 50.0000 mg | ORAL_TABLET | Freq: Two times a day (BID) | ORAL | 1 refills | Status: DC

## 2021-06-17 MED ORDER — RIVAROXABAN 20 MG PO TABS: 20.0000 mg | ORAL_TABLET | Freq: Every day | ORAL | 2 refills | Status: DC

## 2021-06-17 MED ORDER — DILTIAZEM HCL ER COATED BEADS 180 MG PO CP24: 180.0000 mg | ORAL_CAPSULE | Freq: Every day | ORAL | 1 refills | Status: DC

## 2021-06-17 MED ORDER — ALENDRONATE SODIUM 70 MG PO TABS: ORAL_TABLET | ORAL | 0 refills | Status: DC

## 2021-06-17 NOTE — Patient Instructions (Signed)
Health Maintenance After Age 74 After age 74, you are at a higher risk for certain long-term diseases and infections as well as injuries from falls. Falls are a major cause of broken bones and head injuries in people who are older than age 74. Getting regular preventive care can help to keep you healthy and well. Preventive care includes getting regular testing and making lifestyle changes as recommended by your health care provider. Talk with your health care provider about: Which screenings and tests you should have. A screening is a test that checks for a disease when you have no symptoms. A diet and exercise plan that is right for you. What should I know about screenings and tests to prevent falls? Screening and testing are the best ways to find a health problem early. Early diagnosis and treatment give you the best chance of managing medical conditions that are common after age 74. Certain conditions and lifestyle choices may make you more likely to have a fall. Your health care provider may recommend: Regular vision checks. Poor vision and conditions such as cataracts can make you more likely to have a fall. If you wear glasses, make sure to get your prescription updated if your vision changes. Medicine review. Work with your health care provider to regularly review all of the medicines you are taking, including over-the-counter medicines. Ask your health care provider about any side effects that may make you more likely to have a fall. Tell your health care provider if any medicines that you take make you feel dizzy or sleepy. Strength and balance checks. Your health care provider may recommend certain tests to check your strength and balance while standing, walking, or changing positions. Foot health exam. Foot pain and numbness, as well as not wearing proper footwear, can make you more likely to have a fall. Screenings, including: Osteoporosis screening. Osteoporosis is a condition that causes  the bones to get weaker and break more easily. Blood pressure screening. Blood pressure changes and medicines to control blood pressure can make you feel dizzy. Depression screening. You may be more likely to have a fall if you have a fear of falling, feel depressed, or feel unable to do activities that you used to do. Alcohol use screening. Using too much alcohol can affect your balance and may make you more likely to have a fall. Follow these instructions at home: Lifestyle Do not drink alcohol if: Your health care provider tells you not to drink. If you drink alcohol: Limit how much you have to: 0-1 drink a day for women. 0-2 drinks a day for men. Know how much alcohol is in your drink. In the [place] one drink equals one 12 oz bottle of beer (355 mL), one 5 oz glass of wine (148 mL), or one 1 oz glass of hard liquor (44 mL). Do not use any products that contain nicotine or tobacco. These products include cigarettes, chewing tobacco, and vaping devices, such as e-cigarettes. If you need help quitting, ask your health care provider. Activity  Follow a regular exercise program to stay fit. This will help you maintain your balance. Ask your health care provider what types of exercise are appropriate for you. If you need a cane or walker, use it as recommended by your health care provider. Wear supportive shoes that have nonskid soles. Safety  Remove any tripping hazards, such as rugs, cords, and clutter. Install safety equipment such as grab bars in bathrooms and safety rails on stairs. Keep rooms and walkways   well-lit. General instructions Talk with your health care provider about your risks for falling. Tell your health care provider if: You fall. Be sure to tell your health care provider about all falls, even ones that seem minor. You feel dizzy, tiredness (fatigue), or off-balance. Take over-the-counter and prescription medicines only as told by your health care provider. These include  supplements. Eat a healthy diet and maintain a healthy weight. A healthy diet includes low-fat dairy products, low-fat (lean) meats, and fiber from whole grains, beans, and lots of fruits and vegetables. Stay current with your vaccines. Schedule regular health, dental, and eye exams. Summary Having a healthy lifestyle and getting preventive care can help to protect your health and wellness after age 74. Screening and testing are the best way to find a health problem early and help you avoid having a fall. Early diagnosis and treatment give you the best chance for managing medical conditions that are more common for people who are older than age 74. Falls are a major cause of broken bones and head injuries in people who are older than age 74. Take precautions to prevent a fall at home. Work with your health care provider to learn what changes you can make to improve your health and wellness and to prevent falls. This information is not intended to replace advice given to you by your health care provider. Make sure you discuss any questions you have with your health care provider. Document Revised: 08/30/2020 Document Reviewed: 08/30/2020 Elsevier Patient Education  2022 [employer] Inc.  

## 2021-06-17 NOTE — Progress Notes (Signed)
° °Subjective:  ° ° Patient ID: Kathryn Arnold, female    DOB: 10/24/1945, 76 y.o.   MRN: [id number] ° °Chief Complaint  °Patient presents with  ° Medical Management of Chronic Issues  ° °Pt presents to the office today for chronic follow up . Pt is followed by Cardiologists for A Fib  as needed. Had an ablation on 10/31/16 and 2016.  She had cardioversion in 08/21. States since changing her diet and her procedures her A fib has improved.  She takes Xarelto 20 mg daily.  ° °She has osteoporosis and takes Fosamax weekly. Her last Dexa scan was 06/06/19. She wants to hold off on it today, but will schedule it.  °Hypertension °This is a chronic problem. The current episode started more than 1 year ago. The problem has been waxing and waning since onset. The problem is uncontrolled. Associated symptoms include anxiety and malaise/fatigue. Pertinent negatives include no peripheral edema or shortness of breath. Risk factors for coronary artery disease include dyslipidemia. The current treatment provides moderate improvement.  °Arthritis °Presents for follow-up visit. She complains of pain and stiffness. The symptoms have been stable. Affected locations include the left MCP and right MCP. Her pain is at a severity of 1/10.  °Hyperlipidemia °This is a chronic problem. The current episode started more than 1 year ago. The problem is controlled. Recent lipid tests were reviewed and are normal. Pertinent negatives include no shortness of breath. Current antihyperlipidemic treatment includes statins. The current treatment provides moderate improvement of lipids. Risk factors for coronary artery disease include dyslipidemia, hypertension, a sedentary lifestyle and post-menopausal.  °Anxiety °Presents for follow-up visit. Symptoms include excessive worry, nervous/anxious behavior and restlessness. Patient reports no irritability or shortness of breath. Symptoms occur occasionally. The severity of symptoms is moderate.   ° ° ° ° ° °Review of Systems  °Constitutional:  Positive for malaise/fatigue. Negative for irritability.  °Respiratory:  Negative for shortness of breath.   °Musculoskeletal:  Positive for arthritis and stiffness.  °Psychiatric/Behavioral:  The patient is nervous/anxious.   °All other systems reviewed and are negative. ° °   °Objective:  ° Physical Exam °Vitals reviewed.  °Constitutional:   °   General: She is not in acute distress. °   Appearance: She is well-developed.  °HENT:  °   Head: Normocephalic and atraumatic.  °   Right Ear: Tympanic membrane normal.  °   Left Ear: Tympanic membrane normal.  °Eyes:  °   Pupils: Pupils are equal, round, and reactive to light.  °Neck:  °   Thyroid: No thyromegaly.  °Cardiovascular:  °   Rate and Rhythm: Normal rate and regular rhythm.  °   Heart sounds: Normal heart sounds. No murmur heard. °Pulmonary:  °   Effort: Pulmonary effort is normal. No respiratory distress.  °   Breath sounds: Normal breath sounds. No wheezing.  °Abdominal:  °   General: Bowel sounds are normal. There is no distension.  °   Palpations: Abdomen is soft.  °   Tenderness: There is no abdominal tenderness.  °Musculoskeletal:     °   General: No tenderness. Normal range of motion.  °   Cervical back: Normal range of motion and neck supple.  °Skin: °   General: Skin is warm and dry.  °Neurological:  °   Mental Status: She is alert and oriented to person, place, and time.  °   Cranial Nerves: No cranial nerve deficit.  °   Deep Tendon Reflexes:   ° °Subjective:  ° ° Patient ID: Kathryn Arnold, female    DOB: 10/24/1945, 76 y.o.   MRN: [id number] ° °Chief Complaint  °Patient presents with  ° Medical Management of Chronic Issues  ° °Pt presents to the office today for chronic follow up . Pt is followed by Cardiologists for A Fib  as needed. Had an ablation on 10/31/16 and 2016.  She had cardioversion in 08/21. States since changing her diet and her procedures her A fib has improved.  She takes Xarelto 20 mg daily.  ° °She has osteoporosis and takes Fosamax weekly. Her last Dexa scan was 06/06/19. She wants to hold off on it today, but will schedule it.  °Hypertension °This is a chronic problem. The current episode started more than 1 year ago. The problem has been waxing and waning since onset. The problem is uncontrolled. Associated symptoms include anxiety and malaise/fatigue. Pertinent negatives include no peripheral edema or shortness of breath. Risk factors for coronary artery disease include dyslipidemia. The current treatment provides moderate improvement.  °Arthritis °Presents for follow-up visit. She complains of pain and stiffness. The symptoms have been stable. Affected locations include the left MCP and right MCP. Her pain is at a severity of 1/10.  °Hyperlipidemia °This is a chronic problem. The current episode started more than 1 year ago. The problem is controlled. Recent lipid tests were reviewed and are normal. Pertinent negatives include no shortness of breath. Current antihyperlipidemic treatment includes statins. The current treatment provides moderate improvement of lipids. Risk factors for coronary artery disease include dyslipidemia, hypertension, a sedentary lifestyle and post-menopausal.  °Anxiety °Presents for follow-up visit. Symptoms include excessive worry, nervous/anxious behavior and restlessness. Patient reports no irritability or shortness of breath. Symptoms occur occasionally. The severity of symptoms is moderate.   ° ° ° ° ° °Review of Systems  °Constitutional:  Positive for malaise/fatigue. Negative for irritability.  °Respiratory:  Negative for shortness of breath.   °Musculoskeletal:  Positive for arthritis and stiffness.  °Psychiatric/Behavioral:  The patient is nervous/anxious.   °All other systems reviewed and are negative. ° °   °Objective:  ° Physical Exam °Vitals reviewed.  °Constitutional:   °   General: She is not in acute distress. °   Appearance: She is well-developed.  °HENT:  °   Head: Normocephalic and atraumatic.  °   Right Ear: Tympanic membrane normal.  °   Left Ear: Tympanic membrane normal.  °Eyes:  °   Pupils: Pupils are equal, round, and reactive to light.  °Neck:  °   Thyroid: No thyromegaly.  °Cardiovascular:  °   Rate and Rhythm: Normal rate and regular rhythm.  °   Heart sounds: Normal heart sounds. No murmur heard. °Pulmonary:  °   Effort: Pulmonary effort is normal. No respiratory distress.  °   Breath sounds: Normal breath sounds. No wheezing.  °Abdominal:  °   General: Bowel sounds are normal. There is no distension.  °   Palpations: Abdomen is soft.  °   Tenderness: There is no abdominal tenderness.  °Musculoskeletal:     °   General: No tenderness. Normal range of motion.  °   Cervical back: Normal range of motion and neck supple.  °Skin: °   General: Skin is warm and dry.  °Neurological:  °   Mental Status: She is alert and oriented to person, place, and time.  °   Cranial Nerves: No cranial nerve deficit.  °   Deep Tendon Reflexes:

## 2021-06-18 LAB — CMP14+EGFR
ALT: 20 IU/L (ref 0–32)
AST: 23 IU/L (ref 0–40)
Albumin/Globulin Ratio: 1.7 (ref 1.2–2.2)
Albumin: 4.5 g/dL (ref 3.7–4.7)
Alkaline Phosphatase: 54 IU/L (ref 44–121)
BUN/Creatinine Ratio: 24 (ref 12–28)
BUN: 19 mg/dL (ref 8–27)
Bilirubin Total: 0.4 mg/dL (ref 0.0–1.2)
CO2: 22 mmol/L (ref 20–29)
Calcium: 9.6 mg/dL (ref 8.7–10.3)
Chloride: 103 mmol/L (ref 96–106)
Creatinine, Ser: 0.78 mg/dL (ref 0.57–1.00)
Globulin, Total: 2.6 g/dL (ref 1.5–4.5)
Glucose: 97 mg/dL (ref 70–99)
Potassium: 4.4 mmol/L (ref 3.5–5.2)
Sodium: 140 mmol/L (ref 134–144)
Total Protein: 7.1 g/dL (ref 6.0–8.5)
eGFR: 80 mL/min/{1.73_m2} (ref >59)

## 2021-06-18 LAB — CBC WITH DIFFERENTIAL/PLATELET
Basophils Absolute: 0 10*3/uL (ref 0.0–0.2)
Basos: 1 %
EOS (ABSOLUTE): 0.1 10*3/uL (ref 0.0–0.4)
Eos: 2 %
Hematocrit: 42.9 % (ref 34.0–46.6)
Hemoglobin: 14.2 g/dL (ref 11.1–15.9)
Immature Grans (Abs): 0 10*3/uL (ref 0.0–0.1)
Immature Granulocytes: 0 %
Lymphocytes Absolute: 1.4 10*3/uL (ref 0.7–3.1)
Lymphs: 31 %
MCH: 29.5 pg (ref 26.6–33.0)
MCHC: 33.1 g/dL (ref 31.5–35.7)
MCV: 89 fL (ref 79–97)
Monocytes Absolute: 0.5 10*3/uL (ref 0.1–0.9)
Monocytes: 10 %
Neutrophils Absolute: 2.6 10*3/uL (ref 1.4–7.0)
Neutrophils: 56 %
Platelets: 257 10*3/uL (ref 150–450)
RBC: 4.82 x10E6/uL (ref 3.77–5.28)
RDW: 11.9 % (ref 11.7–15.4)
WBC: 4.6 10*3/uL (ref 3.4–10.8)

## 2021-06-18 LAB — LIPID PANEL
Chol/HDL Ratio: 3 ratio (ref 0.0–4.4)
Cholesterol, Total: 169 mg/dL (ref 100–199)
HDL: 57 mg/dL (ref >39)
LDL Chol Calc (NIH): 98 mg/dL (ref 0–99)
Triglycerides: 76 mg/dL (ref 0–149)
VLDL Cholesterol Cal: 14 mg/dL (ref 5–40)

## 2021-06-18 LAB — TSH: TSH: 1.2 u[IU]/mL (ref 0.450–4.500)

## 2021-06-18 LAB — VITAMIN D 25 HYDROXY (VIT D DEFICIENCY, FRACTURES): Vit D, 25-Hydroxy: 48 ng/mL (ref 30.0–100.0)

## 2021-06-20 ENCOUNTER — Other Ambulatory Visit: Payer: Self-pay | Admitting: Family

## 2021-06-22 ENCOUNTER — Telehealth: Payer: Self-pay | Admitting: Family

## 2021-06-22 NOTE — Telephone Encounter (Signed)
Patient aware of lab results and verbalizes understanding.  

## 2021-06-23 ENCOUNTER — Other Ambulatory Visit: Payer: Self-pay | Admitting: Family

## 2021-06-24 ENCOUNTER — Telehealth: Payer: Self-pay

## 2021-06-24 NOTE — Telephone Encounter (Signed)
Patient affriad to go with out it that long she wants to go ahead and take it FYI  ?

## 2021-06-24 NOTE — Telephone Encounter (Signed)
Ok to skip tonight's dose.  ?

## 2021-06-24 NOTE — Telephone Encounter (Signed)
Patient takes Xarelto at night.  She took it last night as usual but this morning she accidentally took it again.  She said she tried to throw it up and some came up, had already started dissolving.  She wants to know if she should skip tonight's dose.  She is concerned about skipping it because a year or so ago she forgot to take and went in to Afib.  Please advise. ?

## 2021-08-25 ENCOUNTER — Telehealth: Payer: Self-pay | Admitting: Family

## 2021-08-25 NOTE — Telephone Encounter (Signed)
Pt called wanting to know if she has ever had a tdap and if she is due to get the shot?  ? ?Dont see it listed on pts immunization record. ? ?Please call patient. ?

## 2021-08-25 NOTE — Telephone Encounter (Signed)
Called - no tdap in chart or on states site - per pt she has not had any immunizations anywhere else. Appt made with nurse for tomorrow  ?

## 2021-08-26 ENCOUNTER — Ambulatory Visit: Payer: 59 | Admitting: Emergency Medicine

## 2021-08-26 DIAGNOSIS — Z23 Encounter for immunization: Secondary | ICD-10-CM | POA: Diagnosis not present

## 2021-08-31 ENCOUNTER — Telehealth: Payer: Self-pay | Admitting: Emergency Medicine

## 2021-08-31 ENCOUNTER — Other Ambulatory Visit: Payer: Self-pay

## 2021-08-31 ENCOUNTER — Emergency Department (HOSPITAL_COMMUNITY): Payer: 59

## 2021-08-31 ENCOUNTER — Emergency Department (HOSPITAL_COMMUNITY)
Admission: EM | Admit: 2021-08-31 | Discharge: 2021-08-31 | Disposition: A | Discharge status: Home / Self Care | Payer: 59 | Attending: Emergency Medicine | Admitting: Emergency Medicine

## 2021-08-31 ENCOUNTER — Encounter (HOSPITAL_COMMUNITY): Payer: Self-pay

## 2021-08-31 DIAGNOSIS — Z7901 Long term (current) use of anticoagulants: Secondary | ICD-10-CM | POA: Diagnosis not present

## 2021-08-31 DIAGNOSIS — I4892 Unspecified atrial flutter: Secondary | ICD-10-CM | POA: Insufficient documentation

## 2021-08-31 DIAGNOSIS — R002 Palpitations: Secondary | ICD-10-CM | POA: Diagnosis present

## 2021-08-31 LAB — CBC
HCT: 46 % (ref 36.0–46.0)
Hemoglobin: 15.5 g/dL — ABNORMAL HIGH (ref 12.0–15.0)
MCH: 29.9 pg (ref 26.0–34.0)
MCHC: 33.7 g/dL (ref 30.0–36.0)
MCV: 88.8 fL (ref 80.0–100.0)
Platelets: 276 10*3/uL (ref 150–400)
RBC: 5.18 MIL/uL — ABNORMAL HIGH (ref 3.87–5.11)
RDW: 11.9 % (ref 11.5–15.5)
WBC: 7.6 10*3/uL (ref 4.0–10.5)
nRBC: 0 % (ref 0.0–0.2)

## 2021-08-31 LAB — TROPONIN I (HIGH SENSITIVITY): Troponin I (High Sensitivity): 12 ng/L (ref <18)

## 2021-08-31 LAB — BASIC METABOLIC PANEL
Anion gap: 9 (ref 5–15)
BUN: 16 mg/dL (ref 8–23)
CO2: 24 mmol/L (ref 22–32)
Calcium: 9.6 mg/dL (ref 8.9–10.3)
Chloride: 105 mmol/L (ref 98–111)
Creatinine, Ser: 0.61 mg/dL (ref 0.44–1.00)
GFR, Estimated: >60 mL/min (ref >60)
Glucose, Bld: 110 mg/dL — ABNORMAL HIGH (ref 70–99)
Potassium: 4.1 mmol/L (ref 3.5–5.1)
Sodium: 138 mmol/L (ref 135–145)

## 2021-08-31 IMAGING — DX DG CHEST 2V
2 series · 2 of 2 positions shown · non-contrast
Comparison: 12/11/2019

CLINICAL DATA: AFib

EXAM:
CHEST - 2 VIEW

[chest pa]
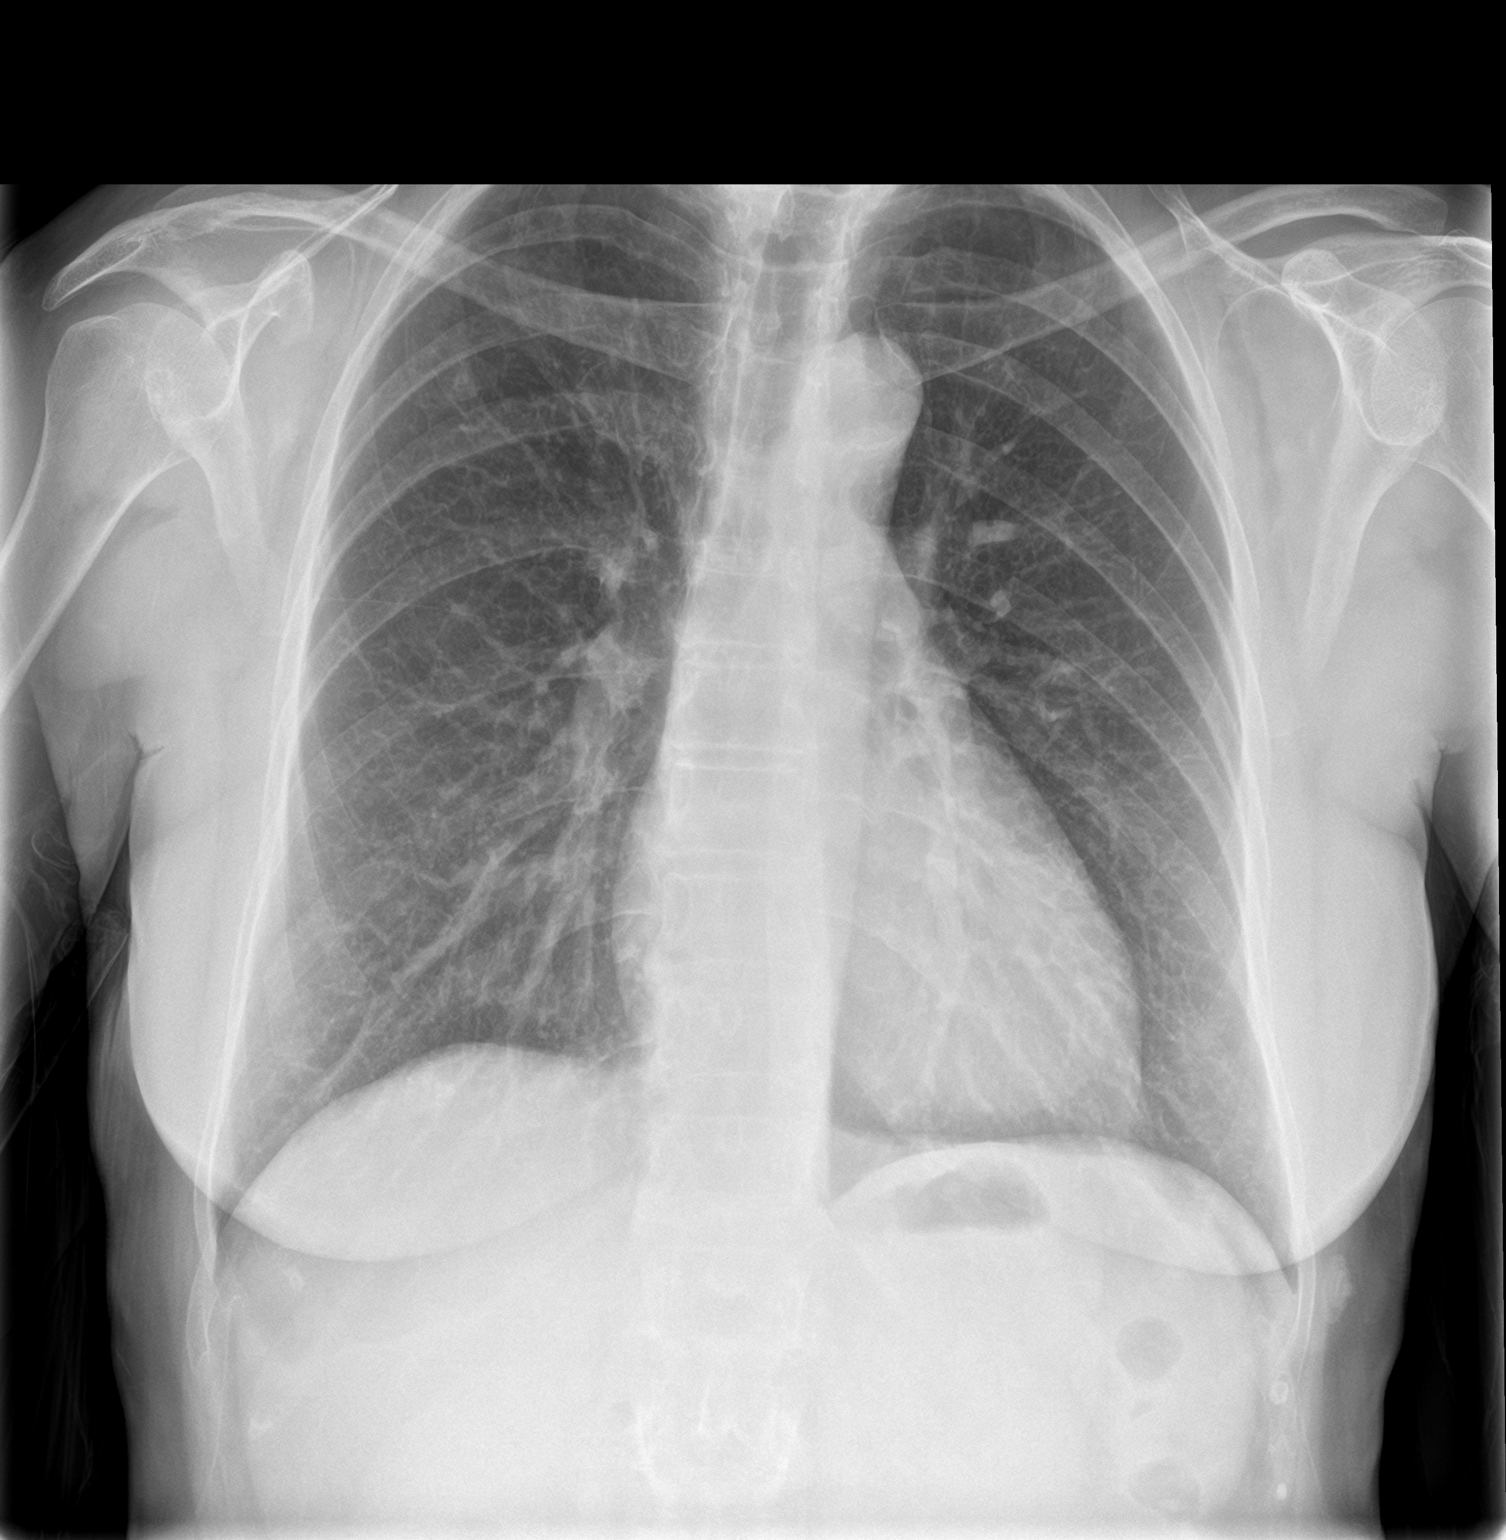

[chest lat]
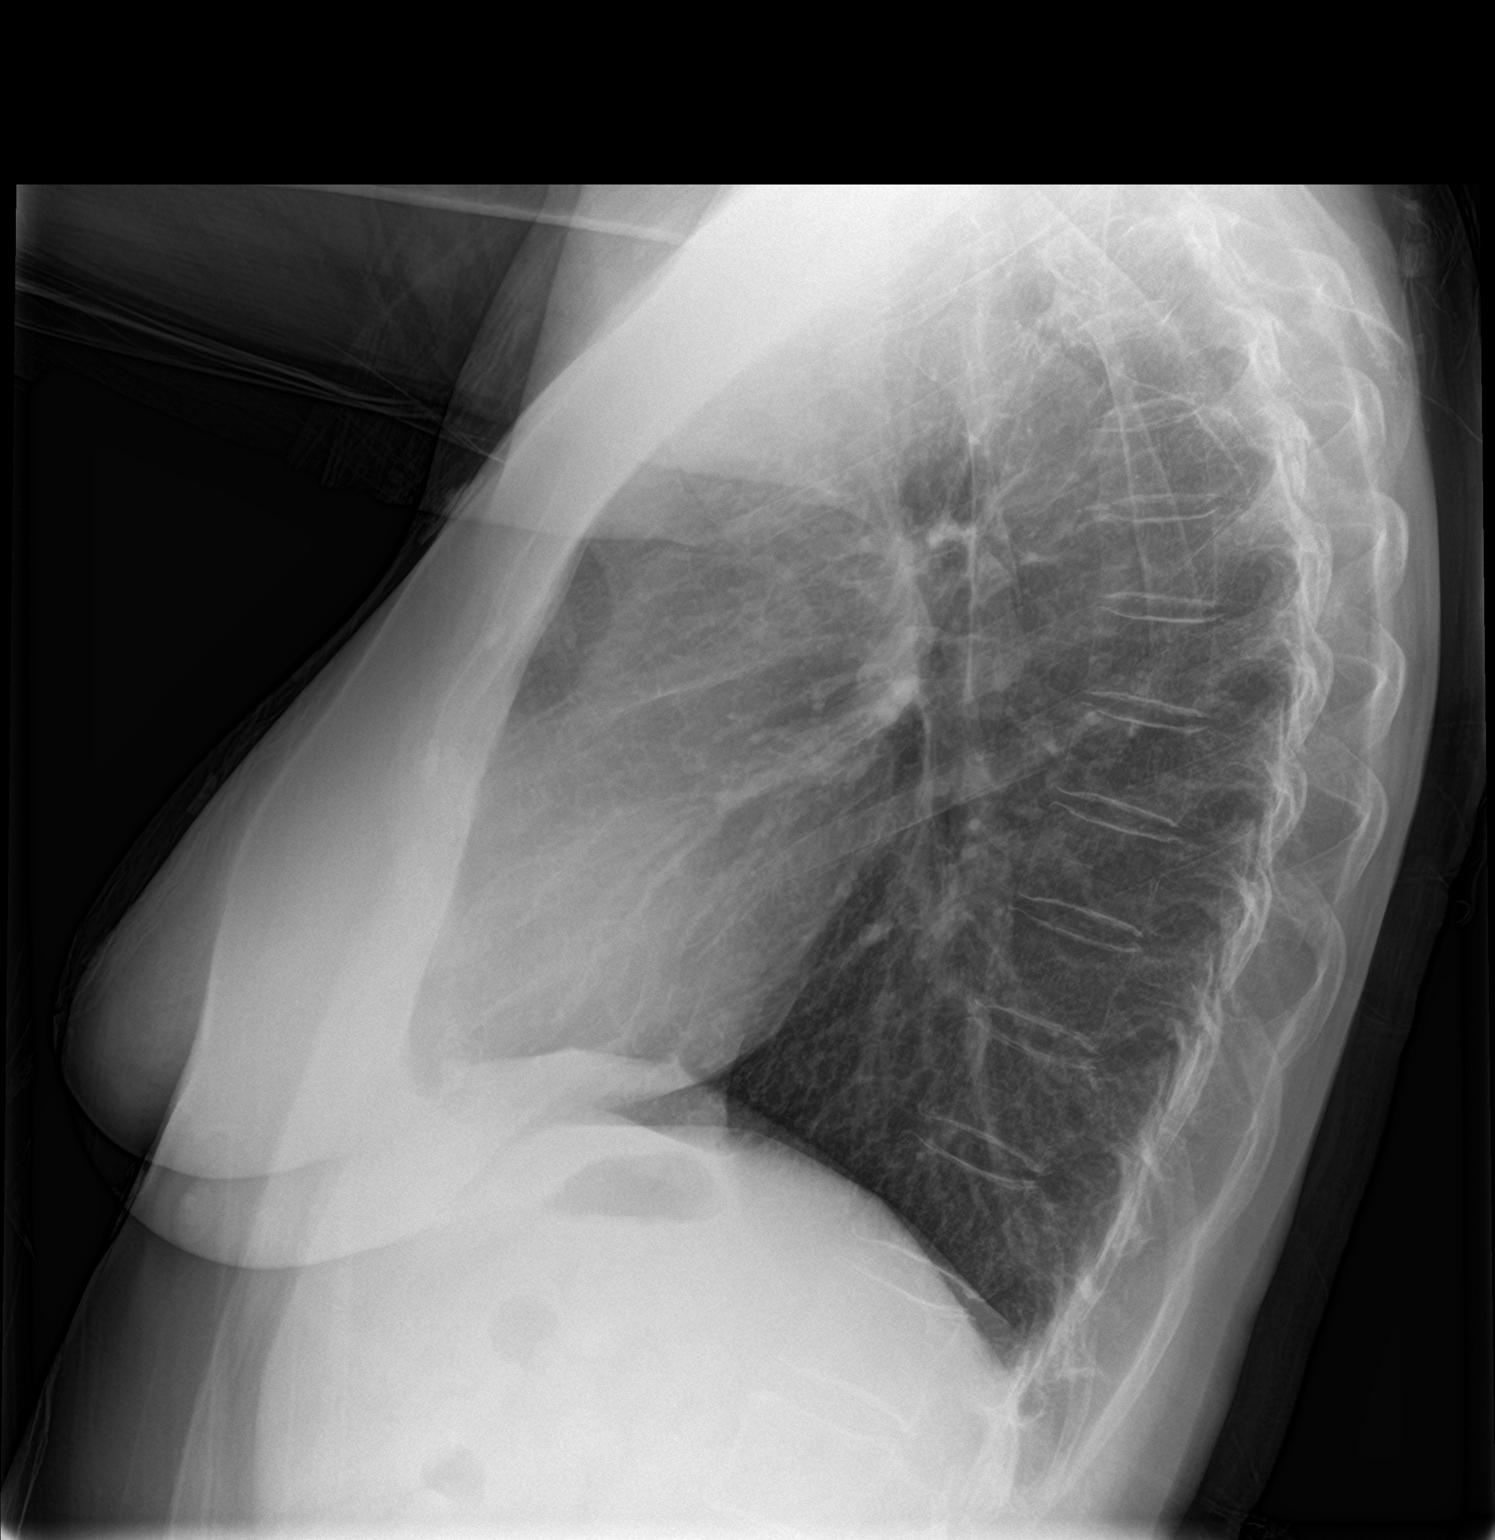

[2 of 2 positions shown; findings below may reference images not displayed]

FINDINGS: Cardiac and mediastinal contours are within normal limits. No focal
pulmonary opacity. No pleural effusion or pneumothorax. No acute
osseous abnormality.
IMPRESSION: No acute cardiopulmonary process.

## 2021-08-31 MED ORDER — ETOMIDATE 2 MG/ML IV SOLN
10.0000 mg | Freq: Once | INTRAVENOUS | Status: AC
  Administered 2021-08-31: 10 mg via INTRAVENOUS
  Filled 2021-08-31: qty 10

## 2021-08-31 MED ORDER — DILTIAZEM HCL 60 MG PO TABS: 30.0000 mg | ORAL_TABLET | Freq: Four times a day (QID) | ORAL | 0 refills | Status: DC | PRN

## 2021-08-31 NOTE — Discharge Instructions (Addendum)
Call your primary care doctor or specialist as discussed in the next 2-3 days.   Return immediately back to the ER if:  Your symptoms worsen within the next 12-24 hours. You develop new symptoms such as new fevers, persistent vomiting, new pain, shortness of breath, or new weakness or numbness, or if you have any other concerns.  

## 2021-08-31 NOTE — ED Triage Notes (Signed)
Pt c/o afib with hx of same.  Pt is alert and oriented.  Reports mild sob.  Reports aching in  back.  Reports took Cardizem po prior to coming to the ed.  Denies cp.  Reports generalized weakness and dizziness.   ?

## 2021-08-31 NOTE — ED Provider Triage Note (Signed)
Emergency Medicine Provider Triage Evaluation Note  Kathryn Arnold , a 76 y.o. female  was evaluated in triage.  Pt complains of atrial fibrillation.  States for the last few hours he feel like her heart is racing, she also has some chest pain that radiates to her back.  Try taking Cardizem twice without any improvement.  She is on Xarelto at baseline, no missed doses..  Review of Systems  PER HPI  Physical Exam  BP (!) 145/100   Pulse (!) 147   Temp 98 F (36.7 C) (Oral)   Resp 20   Ht 5\' 5"  (1.651 m)   Wt 65.8 kg   SpO2 97%   BMI 24.13 kg/m  Gen:   Awake, no distress   Resp:  Normal effort  MSK:   Moves extremities without difficulty  Other:  Very tachycardic, irregular rhythm  Medical Decision Making  Medically screening exam initiated at 4:08 PM.  Appropriate orders placed.  SHARYL PANCHAL was informed that the remainder of the evaluation will be completed by another provider, this initial triage assessment does not replace that evaluation, and the importance of remaining in the ED until their evaluation is complete.     [name], PA-C 08/31/21 1609

## 2021-08-31 NOTE — Telephone Encounter (Signed)
Patient states she take 180 mg of Diltiazem at night. When she is in A-Fib she takes 60mg  tablets. she is currently out and needs called in. Please advise.  ? ?Covering for PCP  ? ?[facility] [place] ?

## 2021-08-31 NOTE — Telephone Encounter (Signed)
Pt aware.

## 2021-08-31 NOTE — ED Provider Notes (Signed)
?[facility] EMERGENCY DEPARTMENT ?Provider Note ? ? ?CSN: [id number] ?Arrival date & time: 08/31/21  1448 ? ?  ? ?History ? ?Chief Complaint  ?Patient presents with  ? Atrial Fibrillation  ? ? ?Kathryn Arnold is a 76 y.o. female. ? ?Patient presents ER chief complaint of palpitations rapid heart rate.  She has been in atrial fibrillation with rapid ventricular rate before and states it feels the same.  It started about 3 to 4 hours prior to arrival.  Denies any fevers or cough or vomiting or diarrhea. ? ? ?  ? ?Home Medications ?Prior to Admission medications   ?Medication Sig Start Date End Date Taking? Authorizing Provider  ?alendronate (FOSAMAX) 70 MG tablet Take with a full glass of water on an empty stomach. 06/17/21   Junie Spencer, FNP  ?Ascorbic Acid (VITAMIN C PO) Take one tablet by mouth daily    [provider]  ?Calcium Citrate-Vitamin D (CITRACAL + D PO) Take 500 mg by mouth 2 (two) times daily.    [provider]  ?diltiazem (CARDIZEM CD) 180 MG 24 hr capsule Take 1 capsule (180 mg total) by mouth daily. 06/17/21   Junie Spencer, FNP  ?diltiazem (CARDIZEM) 60 MG tablet Take 0.5-1 tablets (30-60 mg total) by mouth every 6 (six) hours as needed (Heart rate over 100 as long as systolic blood pressure >100). 08/31/21   Raliegh Ip, DO  ?flecainide (TAMBOCOR) 50 MG tablet Take 1 tablet (50 mg total) by mouth 2 (two) times daily. 06/17/21   Junie Spencer, FNP  ?glucosamine-chondroitin 500-400 MG tablet Take 1 tablet by mouth 3 (three) times daily.    [provider]  ?Magnesium 250 MG TABS Take 1 tablet by mouth every evening.    [provider]  ?Multiple Vitamins-Minerals (CENTRUM SILVER PO) Take 1 tablet by mouth daily.    [provider]  ?rivaroxaban (XARELTO) 20 MG TABS tablet Take 1 tablet (20 mg total) by mouth at bedtime. 06/17/21   Junie Spencer, FNP  ?rosuvastatin (CRESTOR) 20 MG tablet Take 1 tablet (20 mg total) by mouth at bedtime.  06/17/21   Junie Spencer, FNP  ?valACYclovir (VALTREX) 500 MG tablet Take 1 tablet (500 mg total) by mouth daily. 12/16/20   Junie Spencer, FNP  ?vitamin E 1000 UNIT capsule Take 1,000 Units by mouth daily.    [provider]  ?   ? ?Allergies    ?Patient has no known allergies.   ? ?Review of Systems   ?Review of Systems  ?Constitutional:  Negative for fever.  ?HENT:  Negative for ear pain.   ?Eyes:  Negative for pain.  ?Respiratory:  Negative for cough.   ?Cardiovascular:  Positive for palpitations.  ?Gastrointestinal:  Negative for abdominal pain.  ?Genitourinary:  Negative for flank pain.  ?Musculoskeletal:  Negative for back pain.  ?Skin:  Negative for rash.  ?Neurological:  Negative for headaches.  ? ?Physical Exam ?Updated Vital Signs ?BP (!) 156/90 (BP Location: Right Arm)   Pulse 67   Temp 98.1 ?F (36.7 ?C) (Oral)   Resp 18   Ht 5\' 5"  (1.651 m)   Wt 65.8 kg   SpO2 100%   BMI 24.13 kg/m?  ?Physical Exam ?Constitutional:   ?   General: She is not in acute distress. ?   Appearance: Normal appearance.  ?HENT:  ?   Head: Normocephalic.  ?   Nose: Nose normal.  ?Eyes:  ?   Extraocular Movements:  Extraocular movements intact.  ?Cardiovascular:  ?   Rate and Rhythm: Tachycardia present.  ?Pulmonary:  ?   Effort: Pulmonary effort is normal.  ?Musculoskeletal:     ?   General: Normal range of motion.  ?   Cervical back: Normal range of motion.  ?Neurological:  ?   General: No focal deficit present.  ?   Mental Status: She is alert. Mental status is at baseline.  ? ? ?ED Results / Procedures / Treatments   ?Labs ?(all labs ordered are listed, but only abnormal results are displayed) ?Labs Reviewed  ?BASIC METABOLIC PANEL - Abnormal; Notable for the following components:  ?    Result Value  ? Glucose, Bld 110 (*)   ? All other components within normal limits  ?CBC - Abnormal; Notable for the following components:  ? RBC 5.18 (*)   ? Hemoglobin 15.5 (*)   ? All other components within normal limits   ?TROPONIN I (HIGH SENSITIVITY)  ? ? ?EKG ?EKG Interpretation ? ?Date/Time:  Wednesday Aug 31 2021 18:20:50 EDT ?Ventricular Rate:  142 ?PR Interval:  59 ?QRS Duration: 137 ?QT Interval:  365 ?QTC Calculation: 562 ?R Axis:   -32 ?Text Interpretation: tachycardia Right bundle branch block ST depr, consider ischemia, anterolateral lds Confirmed by Thamas Jaegers ([id number]) on 08/31/2021 6:32:33 PM ? ?Radiology ?DG Chest 2 View ? ?Result Date: 08/31/2021 ?CLINICAL DATA:  AFib EXAM: CHEST - 2 VIEW COMPARISON:  12/11/2019 FINDINGS: Cardiac and mediastinal contours are within normal limits. No focal pulmonary opacity. No pleural effusion or pneumothorax. No acute osseous abnormality. IMPRESSION: No acute cardiopulmonary process. Electronically Signed   By: Merilyn Baba M.D.   On: 08/31/2021 15:26   ? ?Procedures ?.Cardioversion ? ?Date/Time: 08/31/2021 8:34 PM ?Performed by: Luna Fuse, MD ?Authorized by: Luna Fuse, MD  ? ?Comments:  ?   Patient sedated with etomidate 10 mg IV.  Subsequently cardioverted 150 J, successful reversion to sinus rhythm obtained. ? ?Patient discharged home in stable condition after period of observation. ? ?.Critical Care ?Performed by: Luna Fuse, MD ?Authorized by: Luna Fuse, MD  ? ?Critical care provider statement:  ?  Critical care time (minutes):  30 ?  Critical care time was exclusive of:  Separately billable procedures and treating other patients and teaching time ?  Critical care was necessary to treat or prevent imminent or life-threatening deterioration of the following conditions:  Cardiac failure  ? ? ?Medications Ordered in ED ?Medications  ?etomidate (AMIDATE) injection 10 mg (10 mg Intravenous Given 08/31/21 1934)  ? ? ?ED Course/ Medical Decision Making/ A&P ?  ?                        ?Medical Decision Making ?Risk ?Prescription drug management. ? ? ?Cardiac monitoring shows supraventricular tachycardia narrow complex. ? ?Chart review shows primary care visit for  vaccination May 50,023. ? ?Comorbidities influencing complexity include history of atrial flutter atrial fib on Xarelto. ? ?Patient states she has continued to take Xarelto.  On arrival heart rate is about 150 bpm appears to be regular narrow complex consistent with atrial flutter with 2-1 block. ? ?Decision made to cardiovert the patient.  All questions answered and consent obtained. ? ?After cardioversion patient remained in sinus rhythm no complaints of chest pain or chest discomfort.  Discharged home in stable condition.  Advised outpatient follow-up with her cardiologist this week.  Advise continuing her Xarelto at this time. ? ? ? ? ? ? ? ?  Final Clinical Impression(s) / ED Diagnoses ?Final diagnoses:  ?Atrial flutter with rapid ventricular response ([place])  ? ? ?Rx / DC Orders ?ED Discharge Orders   ? ? None  ? ?  ? ? ?  ?Luna Fuse, MD ?08/31/21 2035 ? ?

## 2021-08-31 NOTE — Telephone Encounter (Signed)
Looks like she is very overdue for cardiology appt.  I have sent in a short supply of the cardizem but please make sure she is scheduling her appts appropriately.  Cc'd to PCP, cards as FYI. ?

## 2021-09-01 NOTE — Telephone Encounter (Signed)
Left message to schedule follow up appt

## 2021-09-06 ENCOUNTER — Telehealth: Payer: Self-pay | Admitting: Family

## 2021-09-06 DIAGNOSIS — I4891 Unspecified atrial fibrillation: Secondary | ICD-10-CM

## 2021-09-06 NOTE — Telephone Encounter (Signed)
Referral placed.   Eliyanna Ault, FNP  

## 2021-09-06 NOTE — Telephone Encounter (Signed)
REFERRAL REQUEST ?Telephone Note ? ?Have you been seen at our office for this problem? YES ?(Advise that they may need an appointment with their PCP before a referral can be done) ? ?Reason for Referral: Afib ?Referral discussed with patient: Kathryn Arnold hadn't talked to her but the hospital told her she needed to see cardiologist. Has appt with cardiologist in [place] but wants to see Dr. Antoine Poche in [place]. [place]oser to home.  ?Best contact number of patient for referral team: [phone]    ?Has patient been seen by a specialist for this issue before: YES  ?Patient provider preference for referral: Dr. Antoine Poche ?Patient location preference for referral: [place] ?  ?Patient notified that referrals can take up to a week or longer to process. If they haven't heard anything within a week they should call back and speak with the referral department.   ?

## 2021-09-06 NOTE — Telephone Encounter (Signed)
Kathryn Arnold, ? ?Are you ok placing the referral or do you want to see her first?  ? ?New onset Afib, in ED on 5/10. ? ?She does not have a hospital follow up scheduled with you yet. ?

## 2021-09-09 ENCOUNTER — Encounter: Payer: Self-pay | Admitting: Family Medicine

## 2021-09-09 ENCOUNTER — Ambulatory Visit: Payer: 59 | Admitting: Family Medicine

## 2021-09-09 VITALS — BP 144/80 | Temp 98.1°F | Ht 65.0 in | Wt 151.0 lb

## 2021-09-09 DIAGNOSIS — R42 Dizziness and giddiness: Secondary | ICD-10-CM

## 2021-09-09 DIAGNOSIS — R5383 Other fatigue: Secondary | ICD-10-CM

## 2021-09-09 DIAGNOSIS — I10 Essential (primary) hypertension: Secondary | ICD-10-CM

## 2021-09-09 DIAGNOSIS — I480 Paroxysmal atrial fibrillation: Secondary | ICD-10-CM | POA: Diagnosis not present

## 2021-09-09 NOTE — Patient Instructions (Signed)
Atrial Fibrillation  Atrial fibrillation is a type of irregular or rapid heartbeat (arrhythmia). In atrial fibrillation, the top part of the heart (atria) beats in an irregular pattern. This makes the heart unable to pump blood normally and effectively. The goal of treatment is to prevent blood clots from forming, control your heart rate, or restore your heartbeat to a normal rhythm. If this condition is not treated, it can cause serious problems, such as a weakened heart muscle (cardiomyopathy) or a stroke. What are the causes? This condition is often caused by medical conditions that damage the heart's electrical system. These include: High blood pressure (hypertension). This is the most common cause. Certain heart problems or conditions, such as heart failure, coronary artery disease, heart valve problems, or heart surgery. Diabetes. Overactive thyroid (hyperthyroidism). Obesity. Chronic kidney disease. In some cases, the cause of this condition is not known. What increases the risk? This condition is more likely to develop in: Older people. People who smoke. Athletes who do endurance exercise. People who have a family history of atrial fibrillation. Men. People who use drugs. People who drink a lot of alcohol. People who have lung conditions, such as emphysema, pneumonia, or COPD. People who have obstructive sleep apnea. What are the signs or symptoms? Symptoms of this condition include: A feeling that your heart is racing or beating irregularly. Discomfort or pain in your chest. Shortness of breath. Sudden light-headedness or weakness. Tiring easily during exercise or activity. Fatigue. Syncope (fainting). Sweating. In some cases, there are no symptoms. How is this diagnosed? Your health care provider may detect atrial fibrillation when taking your pulse. If detected, this condition may be diagnosed with: An electrocardiogram (ECG) to check electrical signals of the  heart. An ambulatory cardiac monitor to record your heart's activity for a few days. A transthoracic echocardiogram (TTE) to create pictures of your heart. A transesophageal echocardiogram (TEE) to create even closer pictures of your heart. A stress test to check your blood supply while you exercise. Imaging tests, such as a CT scan or chest X-ray. Blood tests. How is this treated? Treatment depends on underlying conditions and how you feel when you experience atrial fibrillation. This condition may be treated with: Medicines to prevent blood clots or to treat heart rate or heart rhythm problems. Electrical cardioversion to reset the heart's rhythm. A pacemaker to correct abnormal heart rhythm. Ablation to remove the heart tissue that sends abnormal signals. Left atrial appendage closure to seal the area where blood clots can form. In some cases, underlying conditions will be treated. Follow these instructions at home: Medicines Take over-the counter and prescription medicines only as told by your health care provider. Do not take any new medicines without talking to your health care provider. If you are taking blood thinners: Talk with your health care provider before you take any medicines that contain aspirin or NSAIDs, such as ibuprofen. These medicines increase your risk for dangerous bleeding. Take your medicine exactly as told, at the same time every day. Avoid activities that could cause injury or bruising, and follow instructions about how to prevent falls. Wear a medical alert bracelet or carry a card that lists what medicines you take. Lifestyle     Do not use any products that contain nicotine or tobacco, such as cigarettes, e-cigarettes, and chewing tobacco. If you need help quitting, ask your health care provider. Eat heart-healthy foods. Talk with a dietitian to make an eating plan that is right for you. Exercise regularly as told   by your health care provider. Do not  drink alcohol. Lose weight if you are overweight. Do not use drugs, including cannabis. General instructions If you have obstructive sleep apnea, manage your condition as told by your health care provider. Do not use diet pills unless your health care provider approves. Diet pills can make heart problems worse. Keep all follow-up visits as told by your health care provider. This is important. Contact a health care provider if you: Notice a change in the rate, rhythm, or strength of your heartbeat. Are taking a blood thinner and you notice more bruising. Tire more easily when you exercise or do heavy work. Have a sudden change in weight. Get help right away if you have:  Chest pain, abdominal pain, sweating, or weakness. Trouble breathing. Side effects of blood thinners, such as blood in your vomit, stool, or urine, or bleeding that cannot stop. Any symptoms of a stroke. "BE FAST" is an easy way to remember the main warning signs of a stroke: B - Balance. Signs are dizziness, sudden trouble walking, or loss of balance. E - Eyes. Signs are trouble seeing or a sudden change in vision. F - Face. Signs are sudden weakness or numbness of the face, or the face or eyelid drooping on one side. A - Arms. Signs are weakness or numbness in an arm. This happens suddenly and usually on one side of the body. S - Speech. Signs are sudden trouble speaking, slurred speech, or trouble understanding what people say. T - Time. Time to call emergency services. Write down what time symptoms started. Other signs of a stroke, such as: A sudden, severe headache with no known cause. Nausea or vomiting. Seizure. These symptoms may represent a serious problem that is an emergency. Do not wait to see if the symptoms will go away. Get medical help right away. Call your local emergency services (911 in the [place] Do not drive yourself to the hospital. Summary Atrial fibrillation is a type of irregular or rapid  heartbeat (arrhythmia). Symptoms include a feeling that your heart is beating fast or irregularly. You may be given medicines to prevent blood clots or to treat heart rate or heart rhythm problems. Get help right away if you have signs or symptoms of a stroke. Get help right away if you cannot catch your breath or have chest pain or pressure. This information is not intended to replace advice given to you by your health care provider. Make sure you discuss any questions you have with your health care provider. Document Revised: 10/02/2018 Document Reviewed: 10/02/2018 Elsevier Patient Education  2023 [employer] Inc[employer]  

## 2021-09-09 NOTE — Progress Notes (Signed)
Acute Office Visit  Subjective:     Patient ID: Kathryn Arnold, female    DOB: 08-25-1945, 76 y.o.   MRN: [id number]  Chief Complaint  Patient presents with   Dizziness    HPI Patient is in today for dizziness and fatigue for the last 9 days. This has been present since she was cardioverted in the ER 9 days ago for A fib with rabid response. Reports dizziness when walking that makes her feel off balanced. Reports dizziness with turning her head as well.This has been going on for months but is worse today.  She reports that she has been very fatigued. She feels tired if she does anything. She has also has had a decreased appetite and some mild nausea. She has not been drinking as much water either- has only been drinking 2-3 bottles a day.  BP was 144/80 at home today. HR was in the 50s. Denies chest pain, shortness of breath, orthopnea, focal weakness, edema, vomiting, diarrhea, cough, fever, or chills. Denies palpitations or changes in vision. Denies syncope or confusion.  She was seeing Dr. Domenic Polite for cardiology but he has left the practice. Her PCP put in a referral to Dr. Percival Spanish, but her appointment is not until July. She is currently on flecainide, cardizem, and xarelto.   ROS As per HPI.      Objective:    Temp 98.1 F (36.7 C) (Temporal)   Ht '5\' 5"'  (1.651 m)   Wt 151 lb (68.5 kg)   SpO2 98%   BMI 25.13 kg/m  BP Readings from Last 3 Encounters:  09/09/21 (!) 144/80  08/31/21 127/74  06/17/21 136/76      Physical Exam Vitals and nursing note reviewed.  Constitutional:      General: She is not in acute distress.    Appearance: She is not ill-appearing, toxic-appearing or diaphoretic.  Cardiovascular:     Rate and Rhythm: Normal rate and regular rhythm.     Heart sounds: Normal heart sounds. No murmur heard. Pulmonary:     Effort: No respiratory distress.     Breath sounds: Normal breath sounds.  Abdominal:     General: Bowel sounds are normal. There is no  distension.     Palpations: Abdomen is soft.     Tenderness: There is no abdominal tenderness. There is no guarding or rebound.  Musculoskeletal:     Right lower leg: No edema.     Left lower leg: No edema.  Skin:    General: Skin is warm.  Neurological:     General: No focal deficit present.     Mental Status: She is alert and oriented to person, place, and time.     Cranial Nerves: No cranial nerve deficit.     Sensory: No sensory deficit.     Motor: No weakness.     Coordination: Coordination normal.     Gait: Gait normal.  Psychiatric:        Mood and Affect: Mood normal.        Behavior: Behavior normal.    No results found for any visits on 09/09/21.      Assessment & Plan:   Kathryn Arnold was seen today for dizziness.  Diagnoses and all orders for this visit:  Paroxysmal atrial fibrillation (HCC) EKG NSR today. Labs pending. Will refer to afib clinic for urgent appt as patient was cardioverted last week and does not have follow up with cardiology for 2 more months. Regular HR on exam today.  -  EKG 12-Lead -     CBC with Differential/Platelet -     CMP14+EGFR -     Thyroid Panel With TSH -     Magnesium -     Amb Referral to AFIB Clinic  Essential hypertension, benign Elevated today in office, however reports well controlled at home.  -     EKG 12-Lead -     CBC with Differential/Platelet -     CMP14+EGFR -     Thyroid Panel With TSH -     Magnesium  Fatigue, unspecified type Labs pending. -     EKG 12-Lead -     CBC with Differential/Platelet -     CMP14+EGFR -     Thyroid Panel With TSH -     Magnesium -     Vitamin B12 -     Amb Referral to AFIB Clinic  Dizziness Chronic, worse today. Normal neuro exam. EKG NSR today. Labs pending. ? Side effect of medication. Referral to a fib clinic for further evaluation of this.  -     EKG 12-Lead -     CBC with Differential/Platelet -     CMP14+EGFR -     Thyroid Panel With TSH -     Magnesium -     Vitamin  B12 -     Amb Referral to AFIB Clinic   Return if symptoms worsen or fail to improve.  The patient indicates understanding of these issues and agrees with the plan.  Gwenlyn Perking, FNP

## 2021-09-10 LAB — CMP14+EGFR
ALT: 15 IU/L (ref 0–32)
AST: 23 IU/L (ref 0–40)
Albumin/Globulin Ratio: 1.7 (ref 1.2–2.2)
Albumin: 4.5 g/dL (ref 3.7–4.7)
Alkaline Phosphatase: 47 IU/L (ref 44–121)
BUN/Creatinine Ratio: 18 (ref 12–28)
BUN: 16 mg/dL (ref 8–27)
Bilirubin Total: 0.3 mg/dL (ref 0.0–1.2)
CO2: 24 mmol/L (ref 20–29)
Calcium: 9.2 mg/dL (ref 8.7–10.3)
Chloride: 102 mmol/L (ref 96–106)
Creatinine, Ser: 0.9 mg/dL (ref 0.57–1.00)
Globulin, Total: 2.7 g/dL (ref 1.5–4.5)
Glucose: 91 mg/dL (ref 70–99)
Potassium: 4.4 mmol/L (ref 3.5–5.2)
Sodium: 139 mmol/L (ref 134–144)
Total Protein: 7.2 g/dL (ref 6.0–8.5)
eGFR: 68 mL/min/{1.73_m2} (ref >59)

## 2021-09-10 LAB — CBC WITH DIFFERENTIAL/PLATELET
Basophils Absolute: 0 10*3/uL (ref 0.0–0.2)
Basos: 1 %
EOS (ABSOLUTE): 0.1 10*3/uL (ref 0.0–0.4)
Eos: 1 %
Hematocrit: 43.6 % (ref 34.0–46.6)
Hemoglobin: 14.9 g/dL (ref 11.1–15.9)
Immature Grans (Abs): 0 10*3/uL (ref 0.0–0.1)
Immature Granulocytes: 0 %
Lymphocytes Absolute: 1.5 10*3/uL (ref 0.7–3.1)
Lymphs: 27 %
MCH: 30.6 pg (ref 26.6–33.0)
MCHC: 34.2 g/dL (ref 31.5–35.7)
MCV: 90 fL (ref 79–97)
Monocytes Absolute: 0.5 10*3/uL (ref 0.1–0.9)
Monocytes: 8 %
Neutrophils Absolute: 3.5 10*3/uL (ref 1.4–7.0)
Neutrophils: 63 %
Platelets: 246 10*3/uL (ref 150–450)
RBC: 4.87 x10E6/uL (ref 3.77–5.28)
RDW: 12.2 % (ref 11.7–15.4)
WBC: 5.6 10*3/uL (ref 3.4–10.8)

## 2021-09-10 LAB — THYROID PANEL WITH TSH
Free Thyroxine Index: 1.9 (ref 1.2–4.9)
T3 Uptake Ratio: 23 % — ABNORMAL LOW (ref 24–39)
T4, Total: 8.2 ug/dL (ref 4.5–12.0)
TSH: 1.23 u[IU]/mL (ref 0.450–4.500)

## 2021-09-10 LAB — MAGNESIUM: Magnesium: 2.1 mg/dL (ref 1.6–2.3)

## 2021-09-10 LAB — VITAMIN B12: Vitamin B-12: 884 pg/mL (ref 232–1245)

## 2021-09-14 ENCOUNTER — Ambulatory Visit (HOSPITAL_COMMUNITY): Payer: 59 | Admitting: Nurse Practitioner

## 2021-09-14 ENCOUNTER — Ambulatory Visit (HOSPITAL_COMMUNITY)
Admission: RE | Admit: 2021-09-14 | Discharge: 2021-09-14 | Disposition: A | Discharge status: Home / Self Care | Payer: 59 | Source: Ambulatory Visit | Attending: Nurse Practitioner | Admitting: Nurse Practitioner

## 2021-09-14 ENCOUNTER — Encounter (HOSPITAL_COMMUNITY): Payer: Self-pay | Admitting: Nurse Practitioner

## 2021-09-14 VITALS — BP 164/80 | HR 51 | Ht 65.0 in | Wt 151.8 lb

## 2021-09-14 DIAGNOSIS — D6869 Other thrombophilia: Secondary | ICD-10-CM | POA: Diagnosis not present

## 2021-09-14 DIAGNOSIS — I10 Essential (primary) hypertension: Secondary | ICD-10-CM | POA: Insufficient documentation

## 2021-09-14 DIAGNOSIS — I4819 Other persistent atrial fibrillation: Secondary | ICD-10-CM

## 2021-09-14 DIAGNOSIS — Z7901 Long term (current) use of anticoagulants: Secondary | ICD-10-CM | POA: Diagnosis not present

## 2021-09-14 DIAGNOSIS — I480 Paroxysmal atrial fibrillation: Secondary | ICD-10-CM | POA: Diagnosis present

## 2021-09-14 MED ORDER — DILTIAZEM HCL 60 MG PO TABS: 30.0000 mg | ORAL_TABLET | Freq: Four times a day (QID) | ORAL | 1 refills | Status: DC | PRN

## 2021-09-14 NOTE — Progress Notes (Signed)
Primary Care Physician: Sharion Balloon, FNP Referring Physician: Dr. Harriett Sine is a 76 y.o. female with a h/o afib with 2 ablations and on flecainide that is in the afib clinic f/u cardioversion in the ER.pt has not had any afib x one year. She states that she got take out fast food and went into afib. This has been the trigger in the past. She is in SR today and feels well.   F/u in afib clinic, 09/14/21 as she was seen in the ER for 2:1 atrial flutter and was cardioverted. She remains in SR today. She remains on flecainide 50 mg bid and Cardizem daily. She usually eats at home and is very picky with her food following a very low salt diet. She believes  the afib came from  eating [place] food with a lot of salt at her SIL's house.  She has not required a cardioversion in 2 years.  Today, she denies symptoms of palpitations, chest pain, shortness of breath, orthopnea, PND, lower extremity edema, dizziness, presyncope, syncope, or neurologic sequela. The patient is tolerating medications without difficulties and is otherwise without complaint today.   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   HSV-2 (herpes simplex virus 2) infection    Hypercholesteremia    Hyperlipidemia with target LDL less than 100    Hypertension    Osteoporosis, unspecified    Last DEXA 10/2011    Paroxysmal atrial fibrillation (HCC)    s/p ablation Afib and flutter 11/03/14   PONV (postoperative nausea and vomiting)    Vitamin D deficiency    Past Surgical History:  Procedure Laterality Date   ATRIAL FIBRILLATION ABLATION N/A 10/31/2016   Procedure: Atrial Fibrillation Ablation;  Surgeon: Thompson Grayer, MD;  Location: [facility];  Service: Cardiovascular;  Laterality: N/A;   BREAST LUMPECTOMY Left    DILATION AND  CURETTAGE OF UTERUS     ELECTROPHYSIOLOGIC STUDY N/A 11/03/2014   Procedure: Atrial Fibrillation Ablation;  Surgeon: Thompson Grayer, MD;  Location: [facility];  Service: Cardiovascular;  Laterality: N/A;   implantable loop recorder placement  03/08/2018   [employer] Reveal LINQ implantation by Dr Rayann Heman for afib management post ablation   LAPAROSCOPY ABDOMEN DIAGNOSTIC  1990s   LOOP RECORDER REMOVAL  09/17/2019   MDT Reveal LINQ removed in office by Dr Rayann Heman   TEE WITHOUT CARDIOVERSION N/A 10/30/2016   Procedure: TRANSESOPHAGEAL ECHOCARDIOGRAM (TEE);  Surgeon: Sanda Klein, MD;  Location: [facility];  Service: Cardiovascular;  Laterality: N/A;   TUBAL LIGATION  1978    Current Outpatient Medications  Medication Sig Dispense Refill   alendronate (FOSAMAX) 70 MG tablet Take with a full glass of water on an empty stomach. 12 tablet 0   Ascorbic Acid (VITAMIN C PO) Take one tablet by mouth daily     Calcium Citrate-Vitamin D (CITRACAL + D PO) Take 500 mg by mouth 2 (two) times daily.     diltiazem (CARDIZEM CD) 180 MG 24 hr capsule Take 1 capsule (180 mg total) by mouth daily. 90 capsule 1   diltiazem (CARDIZEM) 60 MG tablet Take 0.5-1 tablets (30-60 mg total) by mouth every 6 (six) hours as needed (Heart rate over 100 as long as systolic blood pressure [id number]). 20 tablet  0   flecainide (TAMBOCOR) 50 MG tablet Take 1 tablet (50 mg total) by mouth 2 (two) times daily. 180 tablet 1   glucosamine-chondroitin 500-400 MG tablet Take 1 tablet by mouth 3 (three) times daily.     Magnesium 250 MG TABS Take 1 tablet by mouth every evening.     Multiple Vitamins-Minerals (CENTRUM SILVER PO) Take 1 tablet by mouth daily.     rivaroxaban (XARELTO) 20 MG TABS tablet Take 1 tablet (20 mg total) by mouth at bedtime. 90 tablet 2   rosuvastatin (CRESTOR) 20 MG tablet Take 1 tablet (20 mg total) by mouth at bedtime. 90 tablet 1   valACYclovir (VALTREX) 500 MG tablet Take 1 tablet (500 mg total) by mouth  daily. 90 tablet 2   vitamin E 1000 UNIT capsule Take 1,000 Units by mouth daily.     No current facility-administered medications for this encounter.    No Known Allergies  Social History   Socioeconomic History   Marital status: Married    Spouse name: Not on file   Number of children: Not on file   Years of education: Not on file   Highest education level: Not on file  Occupational History   Not on file  Tobacco Use   Smoking status: Never   Smokeless tobacco: Never  Vaping Use   Vaping Use: Never used  Substance and Sexual Activity   Alcohol use: No   Drug use: No   Sexual activity: Not Currently    Birth control/protection: None  Other Topics Concern   Not on file  Social History Narrative   Lives in [place] with spouse.   Social Determinants of Health   Financial Resource Strain: Not on file  Food Insecurity: Not on file  Transportation Needs: Not on file  Physical Activity: Not on file  Stress: Not on file  Social Connections: Not on file  Intimate Partner Violence: Not on file    Family History  Problem Relation Age of Onset   Osteoporosis Mother    Cancer Mother        breast   Hypertension Mother    Heart disease Father    Other Neg Hx     ROS- All systems are reviewed and negative except as per the HPI above  Physical Exam: There were no vitals filed for this visit.  Wt Readings from Last 3 Encounters:  09/09/21 68.5 kg  08/31/21 65.8 kg  06/17/21 67.8 kg    Labs: Lab Results  Component Value Date   NA 139 09/09/2021   K 4.4 09/09/2021   CL 102 09/09/2021   CO2 24 09/09/2021   GLUCOSE 91 09/09/2021   BUN 16 09/09/2021   CREATININE 0.90 09/09/2021   CALCIUM 9.2 09/09/2021   PHOS 2.8 03/13/2018   MG 2.1 09/09/2021   No results found for: INR Lab Results  Component Value Date   CHOL 169 06/17/2021   HDL 57 06/17/2021   LDLCALC 98 06/17/2021   TRIG 76 06/17/2021     GEN- The patient is well appearing, alert and oriented x  3 today.   Head- normocephalic, atraumatic Eyes-  Sclera clear, conjunctiva pink Ears- hearing intact Oropharynx- clear Neck- supple, no JVP Lymph- no cervical lymphadenopathy Lungs- Clear to ausculation bilaterally, normal work of breathing Heart- Regular rate and rhythm, no murmurs, rubs or gallops, PMI not laterally displaced GI- soft, NT, ND, + BS Extremities- no clubbing, cyanosis, or edema MS- no significant deformity or atrophy Skin- no  rash or lesion Psych- euthymic mood, full affect Neuro- strength and sensation are intact  EKG-Vent. rate 51 BPM PR interval 190 ms QRS duration 110 ms QT/QTcB 440/405 ms P-R-T axes 65 -2 56 Sinus bradycardia Incomplete right bundle branch block Nonspecific ST and T wave abnormality Abnormal ECG When compared with ECG of 31-Aug-2021 18:20, PREVIOUS ECG IS PRESENT Epic records reviewed G-sinus brady at 55     Assessment and Plan: 1. Paroxysmal afib Has been very quiet since last episode on 12/11/19 with successful DCCV  in ER 09/01/21   Continue with flecainide 50 mg bid  Cardizem 180 mg qd  Will refill Cardizem 60 mg to use for breakthrough afib   2. LInq Removed MAY 2021  3. CHA2DS2VASc score of 3 Continue xarelto 20 mg daily Reminded not to miss doses for one month following a cardioversion   4. HTN Elevated today States stable at home   F/u as needed in afib clinic   [place]. Ai Sonnenfeld, [facility] [address] [place], [place] [address] [phone]

## 2021-10-06 ENCOUNTER — Ambulatory Visit: Payer: 59 | Admitting: Family

## 2021-10-06 ENCOUNTER — Encounter: Payer: Self-pay | Admitting: Family

## 2021-10-06 VITALS — BP 131/72 | HR 80 | Temp 98.0°F | Ht 65.0 in | Wt 148.0 lb

## 2021-10-06 DIAGNOSIS — M1990 Unspecified osteoarthritis, unspecified site: Secondary | ICD-10-CM | POA: Diagnosis not present

## 2021-10-06 DIAGNOSIS — E785 Hyperlipidemia, unspecified: Secondary | ICD-10-CM | POA: Diagnosis not present

## 2021-10-06 DIAGNOSIS — F411 Generalized anxiety disorder: Secondary | ICD-10-CM | POA: Diagnosis not present

## 2021-10-06 DIAGNOSIS — I10 Essential (primary) hypertension: Secondary | ICD-10-CM

## 2021-10-06 NOTE — Progress Notes (Signed)
Subjective:    Patient ID: Kathryn Arnold, female    DOB: 10/30/1945, 76 y.o.   MRN: [id number]  No chief complaint on file.  Pt presents to the office today for paperwork for foster care paperwork. She is in the process of fostering her granddaughter.  Hypertension This is a chronic problem. The current episode started more than 1 year ago. The problem has been resolved since onset. The problem is controlled. Associated symptoms include anxiety. Pertinent negatives include no malaise/fatigue, peripheral edema or shortness of breath. Risk factors for coronary artery disease include dyslipidemia, obesity and sedentary lifestyle. The current treatment provides moderate improvement. There is no history of CVA or heart failure.  Arthritis Presents for follow-up visit. She complains of pain and stiffness. The symptoms have been stable. Affected locations include the left knee, right knee, left MCP and right MCP. Her pain is at a severity of 1/10.  Anxiety Presents for follow-up visit. Symptoms include excessive worry, irritability and nervous/anxious behavior. Patient reports no shortness of breath. Symptoms occur occasionally. The severity of symptoms is moderate.    Hyperlipidemia This is a chronic problem. The current episode started more than 1 year ago. Pertinent negatives include no shortness of breath. Current antihyperlipidemic treatment includes diet change. The current treatment provides mild improvement of lipids.      Review of Systems  Constitutional:  Positive for irritability. Negative for malaise/fatigue.  Respiratory:  Negative for shortness of breath.   Musculoskeletal:  Positive for arthritis and stiffness.  Psychiatric/Behavioral:  The patient is nervous/anxious.   All other systems reviewed and are negative.      Objective:   Physical Exam Vitals reviewed.  Constitutional:      General: She is not in acute distress.    Appearance: She is well-developed.  HENT:      Head: Normocephalic and atraumatic.     Right Ear: Tympanic membrane normal.     Left Ear: Tympanic membrane normal.  Eyes:     Pupils: Pupils are equal, round, and reactive to light.  Neck:     Thyroid: No thyromegaly.  Cardiovascular:     Rate and Rhythm: Normal rate and regular rhythm.     Heart sounds: Normal heart sounds. No murmur heard. Pulmonary:     Effort: Pulmonary effort is normal. No respiratory distress.     Breath sounds: Normal breath sounds. No wheezing.  Abdominal:     General: Bowel sounds are normal. There is no distension.     Palpations: Abdomen is soft.     Tenderness: There is no abdominal tenderness.  Musculoskeletal:        General: No tenderness. Normal range of motion.     Cervical back: Normal range of motion and neck supple.  Skin:    General: Skin is warm and dry.  Neurological:     Mental Status: She is alert and oriented to person, place, and time.     Cranial Nerves: No cranial nerve deficit.     Deep Tendon Reflexes: Reflexes are normal and symmetric.  Psychiatric:        Behavior: Behavior normal.        Thought Content: Thought content normal.        Judgment: Judgment normal.       BP 131/72   Pulse 80   Temp 98 F (36.7 C)   Ht 5\' 5"  (1.651 m)   Wt 148 lb (67.1 kg)   SpO2 96%   BMI 24.63 kg/m  Assessment & Plan:  Kathryn Arnold comes in today with chief complaint of No chief complaint on file.   Diagnosis and orders addressed:  1. Essential hypertension, benign  2. Arthritis  3. GAD (generalized anxiety disorder)  4. Hyperlipidemia with target LDL less than 100   Health Maintenance reviewed Diet and exercise encouraged  Follow up plan: Keep chronic follow up   [name], FNP

## 2021-10-06 NOTE — Patient Instructions (Signed)
Health Maintenance After Age 74 After age 74, you are at a higher risk for certain long-term diseases and infections as well as injuries from falls. Falls are a major cause of broken bones and head injuries in people who are older than age 74. Getting regular preventive care can help to keep you healthy and well. Preventive care includes getting regular testing and making lifestyle changes as recommended by your health care provider. Talk with your health care provider about: Which screenings and tests you should have. A screening is a test that checks for a disease when you have no symptoms. A diet and exercise plan that is right for you. What should I know about screenings and tests to prevent falls? Screening and testing are the best ways to find a health problem early. Early diagnosis and treatment give you the best chance of managing medical conditions that are common after age 74. Certain conditions and lifestyle choices may make you more likely to have a fall. Your health care provider may recommend: Regular vision checks. Poor vision and conditions such as cataracts can make you more likely to have a fall. If you wear glasses, make sure to get your prescription updated if your vision changes. Medicine review. Work with your health care provider to regularly review all of the medicines you are taking, including over-the-counter medicines. Ask your health care provider about any side effects that may make you more likely to have a fall. Tell your health care provider if any medicines that you take make you feel dizzy or sleepy. Strength and balance checks. Your health care provider may recommend certain tests to check your strength and balance while standing, walking, or changing positions. Foot health exam. Foot pain and numbness, as well as not wearing proper footwear, can make you more likely to have a fall. Screenings, including: Osteoporosis screening. Osteoporosis is a condition that causes  the bones to get weaker and break more easily. Blood pressure screening. Blood pressure changes and medicines to control blood pressure can make you feel dizzy. Depression screening. You may be more likely to have a fall if you have a fear of falling, feel depressed, or feel unable to do activities that you used to do. Alcohol use screening. Using too much alcohol can affect your balance and may make you more likely to have a fall. Follow these instructions at home: Lifestyle Do not drink alcohol if: Your health care provider tells you not to drink. If you drink alcohol: Limit how much you have to: 0-1 drink a day for women. 0-2 drinks a day for men. Know how much alcohol is in your drink. In the [place] one drink equals one 12 oz bottle of beer (355 mL), one 5 oz glass of wine (148 mL), or one 1 oz glass of hard liquor (44 mL). Do not use any products that contain nicotine or tobacco. These products include cigarettes, chewing tobacco, and vaping devices, such as e-cigarettes. If you need help quitting, ask your health care provider. Activity  Follow a regular exercise program to stay fit. This will help you maintain your balance. Ask your health care provider what types of exercise are appropriate for you. If you need a cane or walker, use it as recommended by your health care provider. Wear supportive shoes that have nonskid soles. Safety  Remove any tripping hazards, such as rugs, cords, and clutter. Install safety equipment such as grab bars in bathrooms and safety rails on stairs. Keep rooms and walkways   well-lit. General instructions Talk with your health care provider about your risks for falling. Tell your health care provider if: You fall. Be sure to tell your health care provider about all falls, even ones that seem minor. You feel dizzy, tiredness (fatigue), or off-balance. Take over-the-counter and prescription medicines only as told by your health care provider. These include  supplements. Eat a healthy diet and maintain a healthy weight. A healthy diet includes low-fat dairy products, low-fat (lean) meats, and fiber from whole grains, beans, and lots of fruits and vegetables. Stay current with your vaccines. Schedule regular health, dental, and eye exams. Summary Having a healthy lifestyle and getting preventive care can help to protect your health and wellness after age 74. Screening and testing are the best way to find a health problem early and help you avoid having a fall. Early diagnosis and treatment give you the best chance for managing medical conditions that are more common for people who are older than age 74. Falls are a major cause of broken bones and head injuries in people who are older than age 74. Take precautions to prevent a fall at home. Work with your health care provider to learn what changes you can make to improve your health and wellness and to prevent falls. This information is not intended to replace advice given to you by your health care provider. Make sure you discuss any questions you have with your health care provider. Document Revised: 08/30/2020 Document Reviewed: 08/30/2020 Elsevier Patient Education  2023 Elsevier Inc.  

## 2021-10-12 ENCOUNTER — Ambulatory Visit (INDEPENDENT_AMBULATORY_CARE_PROVIDER_SITE_OTHER): Payer: 59 | Admitting: Nurse Practitioner

## 2021-10-12 ENCOUNTER — Encounter: Payer: Self-pay | Admitting: Nurse Practitioner

## 2021-10-12 DIAGNOSIS — Z207 Contact with and (suspected) exposure to pediculosis, acariasis and other infestations: Secondary | ICD-10-CM | POA: Diagnosis not present

## 2021-10-12 NOTE — Progress Notes (Signed)
   Acute Office Visit  Subjective:     Patient ID: Kathryn Arnold, female    DOB: [birth year], 76 y.o.   MRN: [id number]  Chief Complaint  Patient presents with   Head Lice    HPI Patient is in today for exposure to lice.  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  Patient is in clinic today to make sure that she has no lice in her hair/scalp.  No other complaints or concerns.  Review of Systems  Constitutional: Negative.   HENT: Negative.    Eyes: Negative.   Respiratory: Negative.    Cardiovascular: Negative.   Genitourinary: Negative.   Skin: Negative.  Negative for itching and rash.  All other systems reviewed and are negative.       Objective:    There were no vitals taken for this visit. Wt Readings from Last 3 Encounters:  10/06/21 148 lb (67.1 kg)  09/14/21 151 lb 12.8 oz (68.9 kg)  09/09/21 151 lb (68.5 kg)      Physical Exam Vitals and nursing note reviewed.  Constitutional:      Appearance: Normal appearance.  HENT:     Head: Normocephalic.     Right Ear: External ear normal.     Left Ear: External ear normal.  Eyes:     Conjunctiva/sclera: Conjunctivae normal.  Cardiovascular:     Rate and Rhythm: Normal rate and regular rhythm.     Pulses: Normal pulses.     Heart sounds: Normal heart sounds.  Pulmonary:     Effort: Pulmonary effort is normal.     Breath sounds: Normal breath sounds.  Abdominal:     General: Bowel sounds are normal.  Skin:    General: Skin is warm.     Findings: No rash.  Neurological:     Mental Status: She is alert and oriented to person, place, and time.     No results found for any visits on 10/12/21.      Assessment & Plan:  Completed hair and scalp for assessment.  Patient is negative for lice.  Letter completed and given to patient to return back to class.  Advised patient to follow-up with any new symptoms. Problem List Items Addressed This Visit    None Visit Diagnoses     Exposure to head lice    -  Primary       No orders of the defined types were placed in this encounter.   No follow-ups on file.  Daryll Drown, NP

## 2021-10-12 NOTE — Patient Instructions (Signed)
Lice, Adult ? ?  ? ?Lice are tiny insects with claws on the ends of their legs. They are parasites, which means they need to live off another animal to survive. Lice hatch from little round eggs (nits) that are attached to the base of hairs. ?There are three different types of lice: ?Head lice. These lice live on the hair on a person's head. ?Body lice. These lice live on body hair. ?Pubic lice. These lice live on pubic hair. ?Lice can spread from one person to another. Pets do not play a role in transmission. Lice crawl. They do not fly or jump. Lice cause skin irritation and intense itching in the area of the infested hair. Although having lice can be annoying, it is not dangerous. Lice do not spread diseases. Treatment will usually eliminate symptoms within a few days. ?What are the causes? ?This condition may be caused by: ?Very close contact with an infested person. ?Sharing infested items that touch the skin and hair. These include personal items, such as hats, combs, brushes, towels, clothing, pillowcases, or sheets. ?Pubic lice are spread through sexual contact. ?What increases the risk? ?Although having lice is more common among young children, anyone can get lice. Warm weather increases the risk of getting lice. ?What are the signs or symptoms? ?Symptoms of this condition include: ?Itchiness in the affected area. ?Skin irritation. ?Rash or sores on the skin. ?The feeling of something moving in the hair. ?Tiny flakes or sacs near the scalp. These may be white, yellow, or tan. ?Tiny bugs crawling on the hair, scalp, or genital area. ?Trouble sleeping, because lice are most active in the dark. ?How is this diagnosed? ?This condition is diagnosed based on: ?Signs and symptoms. ?A physical exam. ?Your health care provider will examine the affected area closely for live lice, nits, and empty nit cases. ?Nits are typically yellow or white in color. Empty nit cases are white. Lice are gray or brown in color and  about the size of a sesame seed. ?How is this treated? ?Treatment for this condition includes: ?Using a hair rinse that contains a mild insecticide to kill lice. Your health care provider will recommend a prescription or an over-the-counter hair rinse. ?Removing lice, nits, and nit cases using a comb or tweezers. ?Washing items such as clothes, towels, and bed linens in hot water. Dry them afterward. ?Bagging items that cannot be washed or vacuumed. ?Pregnant women should not use medicated shampoo or cream without first talking to a health care provider. ?Follow these instructions at home: ?Using medicated rinse ?Apply medicated hair rinse as told by your health care provider or as per the package instructions. Follow the label instructions carefully. General instructions for applying rinses may include these steps: ?Put on an old shirt or underwear, or use an old towel in case of staining from the hair rinse. ?Wash and towel-dry your head or pubic area before applying the rinse if directed to do so. ?When your hair is dry, apply the rinse. Leave the rinse in your hair for the amount of time specified in the instructions. ?Rinse the area with water. ?Comb your wet hair with a fine-tooth comb. Comb it close to the skin. Comb from the root down to the ends, removing any lice, nits, or nit cases. A lice comb may be included with the medicated rinse. ?Do not wash the infested hair for 2 days while the medicine kills the lice. ?After the treatment, repeat combing out your hair and removing lice, nits,   or nit cases from the hair every 2-3 days. Do this for about 2-3 weeks. After treatment, the remaining lice should be moving more slowly. ?Repeat the treatment in 7-10 days if necessary. ? ?Removing lice from other items ? ?Use hot water to wash all towels, hats, scarves, jackets, bedding, and clothing that you have recently used. Dry the items using the hot air cycle. ?Put any non-washable items that may have been exposed  into plastic bags. Keep the bags tightly closed for 2 weeks to kill any remaining lice. Make sure that there are no holes in the bags. ?Soak all combs and brushes in hot water for 5 to 10 minutes. ?Vacuum carpets, mattresses, and upholstered furniture to remove any loose hair. There is no need to use chemicals, which can be poisonous (toxic). Lice survive for only 1-2 days away from human skin. Nits may survive for up to 1 week. ?General instructions ?Remove any remaining lice, nits, or nit cases from hair using a fine-tooth comb. ?For head lice, ask your health care provider if other family members or close contacts should be examined or treated as well. ?For pubic lice, tell any sexual partners to seek treatment. ?Keep all follow-up visits as told by your health care provider. This is important. ?Contact a health care provider if: ?You develop sores that look infected. ?Your rash or sores do not go away in 1 week. ?The lice or nits return or do not go away after treatment. ?Summary ?Lice are tiny parasitic insects that live on the human body. A parasite needs to live off another animal to survive. ?Lice can spread from one person to another through close contact with an infested person or by sharing personal items, such as combs, brushes, or hats. Pubic lice are spread through sexual contact. ?Lice can be treated with a medicated hair rinse. Follow your health care provider's instructions, or instructions on the package label, if you are being treated with this medicine. ?For head lice, ask your health care provider if other family members or close contacts should be examined or treated as well. For pubic lice, tell any sexual partners to seek treatment. ?This information is not intended to replace advice given to you by your health care provider. Make sure you discuss any questions you have with your health care provider. ?Document Revised: 03/28/2019 Document Reviewed: 04/30/2019 ?Elsevier Patient Education ?  2023 Elsevier Inc. ? ?

## 2021-10-31 NOTE — Progress Notes (Unsigned)
Cardiology Office Note   Date:  11/02/2021   ID:  ARTASIA THANG, DOB 22-Jul-1945, MRN [id number]  PCP:  Junie Spencer, FNP  Cardiologist:   None   Chief Complaint  Patient presents with   Atrial Fibrillation      History of Present Illness: Kathryn Arnold is a 76 y.o. female who presents for follow up of atrial fib.  She has had two ablations and has been treated with flecainide.  She was seen in the [facility].  This is her first visit with me.   She had a Linq removed in May 2021.    She has done well.  She says she had one paroxysm of fibrillation in April and one back in the fall.  She knows that it is highly processed foods with lots of sodium the triggers her.  She is able to control this.  She otherwise feels well.  She denies any cardiovascular symptoms.  She denies any chest pressure, neck or arm discomfort.  She had no shortness of breath.  She is very active.   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   HSV-2 (herpes simplex virus 2) infection    Hypercholesteremia    Hyperlipidemia with target LDL less than 100    Hypertension    Osteoporosis, unspecified    Last DEXA 10/2011    Paroxysmal atrial fibrillation (HCC)    s/p ablation Afib and flutter 11/03/14   PONV (postoperative nausea and vomiting)    Vitamin D deficiency     Past Surgical History:  Procedure Laterality Date   ATRIAL FIBRILLATION ABLATION N/A 10/31/2016   Procedure: Atrial Fibrillation Ablation;  Surgeon: Hillis Range, MD;  Location: [facility] INVASIVE CV [facility];  Service: Cardiovascular;  Laterality: N/A;   BREAST LUMPECTOMY Left    DILATION AND CURETTAGE OF UTERUS     ELECTROPHYSIOLOGIC STUDY N/A 11/03/2014   Procedure: Atrial Fibrillation Ablation;  Surgeon: Hillis Range, MD;  Location: [facility] INVASIVE CV LAB;  Service:  Cardiovascular;  Laterality: N/A;   implantable loop recorder placement  03/08/2018   [employer] Reveal LINQ implantation by Dr Johney Frame for afib management post ablation   LAPAROSCOPY ABDOMEN DIAGNOSTIC  1990s   LOOP RECORDER REMOVAL  09/17/2019   MDT Reveal LINQ removed in office by Dr Johney Frame   TEE WITHOUT CARDIOVERSION N/A 10/30/2016   Procedure: TRANSESOPHAGEAL ECHOCARDIOGRAM (TEE);  Surgeon: Thurmon Fair, MD;  Location: [facility] ENDOSCOPY;  Service: Cardiovascular;  Laterality: N/A;   TUBAL LIGATION  1978     Current Outpatient Medications  Medication Sig Dispense Refill   alendronate (FOSAMAX) 70 MG tablet Take with a full glass of water on an empty stomach. (Patient taking differently: Take 70 mg by mouth once a week. Take with a full glass of water on an empty stomach.) 12 tablet 0   Ascorbic Acid (VITAMIN C PO) Take one tablet by mouth daily     Calcium Citrate-Vitamin D (CITRACAL + D PO) Take 500 mg by mouth 2 (two) times daily.     diltiazem (CARDIZEM CD) 180 MG 24 hr capsule Take 1 capsule (180 mg total) by mouth daily. 90 capsule 1   diltiazem (CARDIZEM) 60 MG tablet Take 0.5-1 tablets (30-60 mg total) by mouth every 6 (six) hours as needed (  Heart rate over 100 as long as systolic blood pressure >100). 20 tablet 1   flecainide (TAMBOCOR) 50 MG tablet Take 1 tablet (50 mg total) by mouth 2 (two) times daily. 180 tablet 1   glucosamine-chondroitin 500-400 MG tablet Take 1 tablet by mouth 3 (three) times daily.     Magnesium 250 MG TABS Take 1 tablet by mouth every evening.     Multiple Vitamins-Minerals (CENTRUM SILVER PO) Take 1 tablet by mouth daily.     rivaroxaban (XARELTO) 20 MG TABS tablet Take 1 tablet (20 mg total) by mouth at bedtime. 90 tablet 2   rosuvastatin (CRESTOR) 20 MG tablet Take 1 tablet (20 mg total) by mouth at bedtime. 90 tablet 1   valACYclovir (VALTREX) 500 MG tablet Take 1 tablet (500 mg total) by mouth daily. 90 tablet 2   vitamin E 1000 UNIT capsule Take 1,000  Units by mouth daily.     No current facility-administered medications for this visit.    Allergies:   Patient has no known allergies.    ROS:  Please see the history of present illness.   Otherwise, review of systems are positive for none.   All other systems are reviewed and negative.    PHYSICAL EXAM: VS:  BP 122/70   Pulse (!) 52   Ht 5\' 5"  (1.651 m)   Wt 148 lb (67.1 kg)   BMI 24.63 kg/m  , BMI Body mass index is 24.63 kg/m. GENERAL:  Well appearing NECK:  No jugular venous distention, waveform within normal limits, carotid upstroke brisk and symmetric, no bruits, no thyromegaly LUNGS:  Clear to auscultation bilaterally CHEST:  Unremarkable HEART:  PMI not displaced or sustained,S1 and S2 within normal limits, no S3, no S4, no clicks, no rubs, no murmurs ABD:  Flat, positive bowel sounds normal in frequency in pitch, no bruits, no rebound, no guarding, no midline pulsatile mass, no hepatomegaly, no splenomegaly EXT:  2 plus pulses throughout, no edema, no cyanosis no clubbing   EKG:  EKG is ordered today. The ekg ordered today demonstrates sinus bradycardia, rate 52, axis within normal limits, intervals within normal limits, no acute ST-T wave changes.   Recent Labs: 09/09/2021: ALT 15; BUN 16; Creatinine, Ser 0.90; Hemoglobin 14.9; Magnesium 2.1; Platelets 246; Potassium 4.4; Sodium 139; TSH 1.230    Lipid Panel    Component Value Date/Time   CHOL 169 06/17/2021 0954   CHOL 136 08/22/2012 0847   TRIG 76 06/17/2021 0954   TRIG 195 (H) 12/18/2013 1103   TRIG 109 08/22/2012 0847   HDL 57 06/17/2021 0954   HDL 47 12/18/2013 1103   HDL 47 08/22/2012 0847   CHOLHDL 3.0 06/17/2021 0954   LDLCALC 98 06/17/2021 0954   LDLCALC 82 12/18/2013 1103   LDLCALC 67 08/22/2012 0847      Wt Readings from Last 3 Encounters:  11/02/21 148 lb (67.1 kg)  10/06/21 148 lb (67.1 kg)  09/14/21 151 lb 12.8 oz (68.9 kg)      Other studies Reviewed: Additional studies/ records  that were reviewed today include: [facility] records. Review of the above records demonstrates:  Please see elsewhere in the note.     ASSESSMENT AND PLAN:  PAF:  Kathryn Arnold has a CHA2DS2 - VASc score of 3.  We had a discussion about whether she should continue or not the Xarelto.  Statistically she might be continuing to have asymptomatic paroxysms although she thinks this unlikely.  After this  conversation she prefers to stay on the Xarelto for risk reduction.  I did review recent labs.  She is not anemic.  She has normal hemoglobin.  Thyroid is unremarkable.  She is on appropriate dose of therapy.  No change  HTN: Her blood pressure is well controlled.  No change in therapy.   Current medicines are reviewed at length with the patient today.  The patient does not have concerns regarding medicines.  The following changes have been made:  no change  Labs/ tests ordered today include: None  Orders Placed This Encounter  Procedures   EKG 12-Lead     Disposition:   FU with me in 1 year   Signed, Rollene Rotunda, MD  11/02/2021 10:48 AM    [facility]

## 2021-11-02 ENCOUNTER — Ambulatory Visit (INDEPENDENT_AMBULATORY_CARE_PROVIDER_SITE_OTHER): Payer: 59 | Admitting: Cardiology

## 2021-11-02 ENCOUNTER — Encounter: Payer: Self-pay | Admitting: Cardiology

## 2021-11-02 VITALS — BP 122/70 | HR 52 | Ht 65.0 in | Wt 148.0 lb

## 2021-11-02 DIAGNOSIS — I480 Paroxysmal atrial fibrillation: Secondary | ICD-10-CM | POA: Diagnosis not present

## 2021-11-02 DIAGNOSIS — I10 Essential (primary) hypertension: Secondary | ICD-10-CM | POA: Diagnosis not present

## 2021-11-02 NOTE — Patient Instructions (Signed)

## 2021-11-11 ENCOUNTER — Telehealth: Payer: Self-pay | Admitting: Family

## 2021-11-11 DIAGNOSIS — M810 Age-related osteoporosis without current pathological fracture: Secondary | ICD-10-CM

## 2021-11-11 MED ORDER — ALENDRONATE SODIUM 70 MG PO TABS: ORAL_TABLET | ORAL | 1 refills | Status: DC

## 2021-11-11 NOTE — Telephone Encounter (Signed)
  Prescription Request  11/11/2021  What is the name of the medication or equipment? ALENDRONATE  Have you contacted your pharmacy to request a refill? YES  Which pharmacy would you like this sent to? [facility]

## 2021-11-11 NOTE — Telephone Encounter (Signed)
Refill sent to pharmacy, patient aware ?

## 2021-12-09 ENCOUNTER — Other Ambulatory Visit: Payer: Self-pay | Admitting: Family

## 2021-12-09 DIAGNOSIS — I480 Paroxysmal atrial fibrillation: Secondary | ICD-10-CM

## 2021-12-13 ENCOUNTER — Other Ambulatory Visit: Payer: Self-pay | Admitting: Family

## 2021-12-13 DIAGNOSIS — B009 Herpesviral infection, unspecified: Secondary | ICD-10-CM

## 2021-12-15 ENCOUNTER — Encounter: Payer: Self-pay | Admitting: Family

## 2021-12-15 ENCOUNTER — Ambulatory Visit: Payer: 59 | Admitting: Family

## 2021-12-15 VITALS — BP 129/69 | HR 49 | Temp 97.6°F | Ht 65.0 in | Wt 148.0 lb

## 2021-12-15 DIAGNOSIS — E559 Vitamin D deficiency, unspecified: Secondary | ICD-10-CM

## 2021-12-15 DIAGNOSIS — I4819 Other persistent atrial fibrillation: Secondary | ICD-10-CM | POA: Diagnosis not present

## 2021-12-15 DIAGNOSIS — M1990 Unspecified osteoarthritis, unspecified site: Secondary | ICD-10-CM

## 2021-12-15 DIAGNOSIS — I10 Essential (primary) hypertension: Secondary | ICD-10-CM

## 2021-12-15 DIAGNOSIS — B009 Herpesviral infection, unspecified: Secondary | ICD-10-CM

## 2021-12-15 DIAGNOSIS — I480 Paroxysmal atrial fibrillation: Secondary | ICD-10-CM

## 2021-12-15 DIAGNOSIS — E785 Hyperlipidemia, unspecified: Secondary | ICD-10-CM

## 2021-12-15 DIAGNOSIS — M810 Age-related osteoporosis without current pathological fracture: Secondary | ICD-10-CM | POA: Diagnosis not present

## 2021-12-15 DIAGNOSIS — F411 Generalized anxiety disorder: Secondary | ICD-10-CM

## 2021-12-15 LAB — CMP14+EGFR
ALT: 16 IU/L (ref 0–32)
AST: 22 IU/L (ref 0–40)
Albumin/Globulin Ratio: 2 (ref 1.2–2.2)
Albumin: 4.5 g/dL (ref 3.8–4.8)
Alkaline Phosphatase: 43 IU/L — ABNORMAL LOW (ref 44–121)
BUN/Creatinine Ratio: 16 (ref 12–28)
BUN: 12 mg/dL (ref 8–27)
Bilirubin Total: 0.5 mg/dL (ref 0.0–1.2)
CO2: 24 mmol/L (ref 20–29)
Calcium: 9.5 mg/dL (ref 8.7–10.3)
Chloride: 103 mmol/L (ref 96–106)
Creatinine, Ser: 0.77 mg/dL (ref 0.57–1.00)
Globulin, Total: 2.3 g/dL (ref 1.5–4.5)
Glucose: 89 mg/dL (ref 70–99)
Potassium: 4.3 mmol/L (ref 3.5–5.2)
Sodium: 141 mmol/L (ref 134–144)
Total Protein: 6.8 g/dL (ref 6.0–8.5)
eGFR: 81 mL/min/{1.73_m2} (ref >59)

## 2021-12-15 MED ORDER — ROSUVASTATIN CALCIUM 20 MG PO TABS: 20.0000 mg | ORAL_TABLET | Freq: Every day | ORAL | 1 refills | Status: DC

## 2021-12-15 MED ORDER — VALACYCLOVIR HCL 500 MG PO TABS: 500.0000 mg | ORAL_TABLET | Freq: Every day | ORAL | 2 refills | Status: DC

## 2021-12-15 MED ORDER — DILTIAZEM HCL ER COATED BEADS 180 MG PO CP24: 180.0000 mg | ORAL_CAPSULE | Freq: Every day | ORAL | 0 refills | Status: DC

## 2021-12-15 MED ORDER — DILTIAZEM HCL 60 MG PO TABS: 30.0000 mg | ORAL_TABLET | Freq: Four times a day (QID) | ORAL | 1 refills | Status: DC | PRN

## 2021-12-15 MED ORDER — ALENDRONATE SODIUM 70 MG PO TABS: ORAL_TABLET | ORAL | 1 refills | Status: DC

## 2021-12-15 NOTE — Progress Notes (Signed)
Subjective:    Patient ID: Kathryn Arnold, female    DOB: 01-03-1946, 76 y.o.   MRN: [id number]  Chief Complaint  Patient presents with   Medical Management of Chronic Issues   Pt presents to the office today for chronic follow up . Pt is followed by Cardiologists for A Fib as needed. Had an ablation on 10/31/16 and 2016.  She had cardioversion in 08/21. States since changing her diet and her procedures her A fib has improved.  She takes Xarelto 20 mg daily.    She has osteoporosis and takes Fosamax weekly. Her last Dexa scan was 06/06/19. She wants to hold off on it today, but will schedule it.   Her grandchildren are in foster care and stressing about this.   She has genital herpes and takes valtrex 500 mg daily. Last flare up has been years ago.  Hypertension This is a chronic problem. The current episode started more than 1 year ago. The problem has been resolved since onset. The problem is controlled. Associated symptoms include anxiety. Pertinent negatives include no malaise/fatigue, peripheral edema or shortness of breath. Risk factors for coronary artery disease include dyslipidemia, obesity and sedentary lifestyle. The current treatment provides moderate improvement. There is no history of heart failure.  Arthritis Presents for follow-up visit. She complains of pain and stiffness. The symptoms have been stable. Affected locations include the right hip, left hip, left MCP and right MCP (back). Her pain is at a severity of 5/10.  Anxiety Presents for follow-up visit. Symptoms include depressed mood, excessive worry, nervous/anxious behavior and restlessness. Patient reports no irritability or shortness of breath. Symptoms occur occasionally. The severity of symptoms is moderate.    Hyperlipidemia This is a chronic problem. The current episode started more than 1 year ago. The problem is controlled. Pertinent negatives include no shortness of breath. Current antihyperlipidemic  treatment includes statins. The current treatment provides moderate improvement of lipids. Risk factors for coronary artery disease include dyslipidemia, hypertension, a sedentary lifestyle and post-menopausal.      Review of Systems  Constitutional:  Negative for irritability and malaise/fatigue.  Respiratory:  Negative for shortness of breath.   Musculoskeletal:  Positive for arthritis and stiffness.  Psychiatric/Behavioral:  The patient is nervous/anxious.   All other systems reviewed and are negative.      Objective:   Physical Exam Vitals reviewed.  Constitutional:      General: She is not in acute distress.    Appearance: She is well-developed.  HENT:     Head: Normocephalic and atraumatic.     Right Ear: Tympanic membrane normal.     Left Ear: Tympanic membrane normal.  Eyes:     Pupils: Pupils are equal, round, and reactive to light.  Neck:     Thyroid: No thyromegaly.  Cardiovascular:     Rate and Rhythm: Normal rate and regular rhythm.     Heart sounds: Normal heart sounds. No murmur heard. Pulmonary:     Effort: Pulmonary effort is normal. No respiratory distress.     Breath sounds: Normal breath sounds. No wheezing.  Abdominal:     General: Bowel sounds are normal. There is no distension.     Palpations: Abdomen is soft.     Tenderness: There is no abdominal tenderness.  Musculoskeletal:        General: No tenderness. Normal range of motion.     Cervical back: Normal range of motion and neck supple.  Skin:    General: Skin  is warm and dry.  Neurological:     Mental Status: She is alert and oriented to person, place, and time.     Cranial Nerves: No cranial nerve deficit.     Deep Tendon Reflexes: Reflexes are normal and symmetric.  Psychiatric:        Behavior: Behavior normal.        Thought Content: Thought content normal.        Judgment: Judgment normal.       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  Dispense: 90 tablet; Refill: 2 - CMP14+EGFR  2. Hyperlipidemia with target LDL less than 100 - rosuvastatin (CRESTOR) 20 MG tablet; Take 1 tablet (20 mg total) by mouth at bedtime.  Dispense: 90 tablet; Refill: 1 - CMP14+EGFR  3. Osteoporosis, unspecified osteoporosis type, unspecified pathological fracture presence - alendronate (FOSAMAX) 70 MG tablet; Take with a full glass of water on an empty stomach weekly  Dispense: 12 tablet; Refill: 1 - CMP14+EGFR  4. Paroxysmal atrial fibrillation (HCC) - diltiazem (CARDIZEM CD) 180 MG 24 hr capsule; Take 1 capsule (180 mg total) by mouth daily.  Dispense: 90 capsule; Refill: 0 - diltiazem (CARDIZEM) 60 MG tablet; Take 0.5-1 tablets (30-60 mg total) by mouth every 6 (six) hours as needed (Heart rate over 100 as long as systolic blood pressure >130).  Dispense: 20 tablet; Refill: 1 - CMP14+EGFR  5. Persistent atrial fibrillation (HCC) - CMP14+EGFR  6. Essential hypertension, benign - diltiazem (CARDIZEM CD) 180 MG 24 hr capsule; Take 1 capsule (180 mg total) by mouth daily.  Dispense: 90 capsule; Refill: 0 - diltiazem (CARDIZEM) 60 MG tablet; Take 0.5-1 tablets (30-60 mg total) by mouth every 6 (six) hours as needed (Heart rate over 100 as long as systolic blood pressure >865).  Dispense: 20 tablet; Refill: 1 - CMP14+EGFR  7. Arthritis - CMP14+EGFR  8. GAD (generalized anxiety disorder) - CMP14+EGFR  9. Vitamin D deficiency - CMP14+EGFR   Labs pending Health Maintenance reviewed Diet and exercise encouraged  Follow up plan: 6 months    Evelina Dun, FNP

## 2021-12-15 NOTE — Patient Instructions (Signed)
Health Maintenance After Age 74 After age 74, you are at a higher risk for certain long-term diseases and infections as well as injuries from falls. Falls are a major cause of broken bones and head injuries in people who are older than age 74. Getting regular preventive care can help to keep you healthy and well. Preventive care includes getting regular testing and making lifestyle changes as recommended by your health care provider. Talk with your health care provider about: Which screenings and tests you should have. A screening is a test that checks for a disease when you have no symptoms. A diet and exercise plan that is right for you. What should I know about screenings and tests to prevent falls? Screening and testing are the best ways to find a health problem early. Early diagnosis and treatment give you the best chance of managing medical conditions that are common after age 74. Certain conditions and lifestyle choices may make you more likely to have a fall. Your health care provider may recommend: Regular vision checks. Poor vision and conditions such as cataracts can make you more likely to have a fall. If you wear glasses, make sure to get your prescription updated if your vision changes. Medicine review. Work with your health care provider to regularly review all of the medicines you are taking, including over-the-counter medicines. Ask your health care provider about any side effects that may make you more likely to have a fall. Tell your health care provider if any medicines that you take make you feel dizzy or sleepy. Strength and balance checks. Your health care provider may recommend certain tests to check your strength and balance while standing, walking, or changing positions. Foot health exam. Foot pain and numbness, as well as not wearing proper footwear, can make you more likely to have a fall. Screenings, including: Osteoporosis screening. Osteoporosis is a condition that causes  the bones to get weaker and break more easily. Blood pressure screening. Blood pressure changes and medicines to control blood pressure can make you feel dizzy. Depression screening. You may be more likely to have a fall if you have a fear of falling, feel depressed, or feel unable to do activities that you used to do. Alcohol use screening. Using too much alcohol can affect your balance and may make you more likely to have a fall. Follow these instructions at home: Lifestyle Do not drink alcohol if: Your health care provider tells you not to drink. If you drink alcohol: Limit how much you have to: 0-1 drink a day for women. 0-2 drinks a day for men. Know how much alcohol is in your drink. In the [place] one drink equals one 12 oz bottle of beer (355 mL), one 5 oz glass of wine (148 mL), or one 1 oz glass of hard liquor (44 mL). Do not use any products that contain nicotine or tobacco. These products include cigarettes, chewing tobacco, and vaping devices, such as e-cigarettes. If you need help quitting, ask your health care provider. Activity  Follow a regular exercise program to stay fit. This will help you maintain your balance. Ask your health care provider what types of exercise are appropriate for you. If you need a cane or walker, use it as recommended by your health care provider. Wear supportive shoes that have nonskid soles. Safety  Remove any tripping hazards, such as rugs, cords, and clutter. Install safety equipment such as grab bars in bathrooms and safety rails on stairs. Keep rooms and walkways   well-lit. General instructions Talk with your health care provider about your risks for falling. Tell your health care provider if: You fall. Be sure to tell your health care provider about all falls, even ones that seem minor. You feel dizzy, tiredness (fatigue), or off-balance. Take over-the-counter and prescription medicines only as told by your health care provider. These include  supplements. Eat a healthy diet and maintain a healthy weight. A healthy diet includes low-fat dairy products, low-fat (lean) meats, and fiber from whole grains, beans, and lots of fruits and vegetables. Stay current with your vaccines. Schedule regular health, dental, and eye exams. Summary Having a healthy lifestyle and getting preventive care can help to protect your health and wellness after age 74. Screening and testing are the best way to find a health problem early and help you avoid having a fall. Early diagnosis and treatment give you the best chance for managing medical conditions that are more common for people who are older than age 74. Falls are a major cause of broken bones and head injuries in people who are older than age 74. Take precautions to prevent a fall at home. Work with your health care provider to learn what changes you can make to improve your health and wellness and to prevent falls. This information is not intended to replace advice given to you by your health care provider. Make sure you discuss any questions you have with your health care provider. Document Revised: 08/30/2020 Document Reviewed: 08/30/2020 Elsevier Patient Education  2023 Elsevier Inc.  

## 2021-12-23 ENCOUNTER — Ambulatory Visit: Payer: 59 | Admitting: Cardiology

## 2022-01-03 ENCOUNTER — Ambulatory Visit: Payer: 59 | Admitting: Family

## 2022-01-03 ENCOUNTER — Ambulatory Visit (INDEPENDENT_AMBULATORY_CARE_PROVIDER_SITE_OTHER): Payer: 59

## 2022-01-03 ENCOUNTER — Encounter: Payer: Self-pay | Admitting: Family

## 2022-01-03 VITALS — BP 135/72 | HR 50 | Temp 98.4°F | Ht 65.0 in | Wt 150.2 lb

## 2022-01-03 DIAGNOSIS — M542 Cervicalgia: Secondary | ICD-10-CM

## 2022-01-03 MED ORDER — PREDNISONE 20 MG PO TABS: 40.0000 mg | ORAL_TABLET | Freq: Every day | ORAL | 0 refills | Status: AC

## 2022-01-03 MED ORDER — BACLOFEN 10 MG PO TABS: 10.0000 mg | ORAL_TABLET | Freq: Three times a day (TID) | ORAL | 0 refills | Status: DC

## 2022-01-03 NOTE — Progress Notes (Signed)
Subjective:    Patient ID: Kathryn Arnold, female    DOB: Jul 21, 1945, 76 y.o.   MRN: [id number]  Chief Complaint  Patient presents with   Neck Pain   PT presents to the office today with neck pain that started several months ago. Reports the pain has worsen. She has seen a chiropractor without relief.  Neck Pain  This is a new problem. The current episode started more than 1 month ago. The problem occurs constantly. The problem has been waxing and waning. The pain is associated with nothing. The pain is present in the midline, left side and right side. The quality of the pain is described as aching. The pain is at a severity of 6/10. The pain is moderate. The symptoms are aggravated by twisting. Pertinent negatives include no fever, headaches, leg pain, syncope, tingling or trouble swallowing. She has tried bed rest for the symptoms. The treatment provided mild relief.      Review of Systems  Constitutional:  Negative for fever.  HENT:  Negative for trouble swallowing.   Cardiovascular:  Negative for syncope.  Musculoskeletal:  Positive for neck pain.  Neurological:  Negative for tingling and headaches.  All other systems reviewed and are negative.      Objective:   Physical Exam Vitals reviewed.  Constitutional:      General: She is not in acute distress.    Appearance: She is well-developed.  HENT:     Head: Normocephalic and atraumatic.     Right Ear: Tympanic membrane normal.     Left Ear: Tympanic membrane normal.  Eyes:     Pupils: Pupils are equal, round, and reactive to light.  Neck:     Thyroid: No thyromegaly.  Cardiovascular:     Rate and Rhythm: Normal rate and regular rhythm.     Heart sounds: Normal heart sounds. No murmur heard. Pulmonary:     Effort: Pulmonary effort is normal. No respiratory distress.     Breath sounds: Normal breath sounds. No wheezing.  Abdominal:     General: Bowel sounds are normal. There is no distension.     Palpations: Abdomen  is soft.     Tenderness: There is no abdominal tenderness.  Musculoskeletal:        General: No tenderness. Normal range of motion.     Cervical back: Normal range of motion and neck supple.     Comments: Full ROM on neck, pain in posterior neck with rotation  Skin:    General: Skin is warm and dry.  Neurological:     Mental Status: She is alert and oriented to person, place, and time.     Cranial Nerves: No cranial nerve deficit.     Deep Tendon Reflexes: Reflexes are normal and symmetric.  Psychiatric:        Behavior: Behavior normal.        Thought Content: Thought content normal.        Judgment: Judgment normal.    BP 135/72   Pulse (!) 50   Temp 98.4 F (36.9 C) (Temporal)   Ht 5\' 5"  (1.651 m)   Wt 150 lb 3.2 oz (68.1 kg)   SpO2 96%   BMI 24.99 kg/m        Assessment & Plan:  LOCKLYN HENRIQUEZ comes in today with chief complaint of Neck Pain   Diagnosis and orders addressed:  1. Neck pain Rest ROM exercises  Can not take NSAID's because she is taking Xarelto Heat  as needed Baclofen as needed- sedation precautions discussed  Follow up if symptoms worsen or do not improve  - DG Cervical Spine Complete - predniSONE (DELTASONE) 20 MG tablet; Take 2 tablets (40 mg total) by mouth daily with breakfast for 5 days.  Dispense: 10 tablet; Refill: 0 - baclofen (LIORESAL) 10 MG tablet; Take 1 tablet (10 mg total) by mouth 3 (three) times daily.  Dispense: 30 each; Refill: 0  [name], FNP

## 2022-01-03 NOTE — Patient Instructions (Signed)
Cervical Strain and Sprain Rehab Ask your health care provider which exercises are safe for you. Do exercises exactly as told by your health care provider and adjust them as directed. It is normal to feel mild stretching, pulling, tightness, or discomfort as you do these exercises. Stop right away if you feel sudden pain or your pain gets worse. Do not begin these exercises until told by your health care provider. Stretching and range-of-motion exercises Cervical side bending  Using good posture, sit on a stable chair or stand up. Without moving your shoulders, slowly tilt your left / right ear to your shoulder until you feel a stretch in the opposite side neck muscles. You should be looking straight ahead. Hold for __________ seconds. Repeat with the other side of your neck. Repeat __________ times. Complete this exercise __________ times a day. Cervical rotation  Using good posture, sit on a stable chair or stand up. Slowly turn your head to the side as if you are looking over your left / right shoulder. Keep your eyes level with the ground. Stop when you feel a stretch along the side and the back of your neck. Hold for __________ seconds. Repeat this by turning to your other side. Repeat __________ times. Complete this exercise __________ times a day. Thoracic extension and pectoral stretch  Roll a towel or a small blanket so it is about 4 inches (10 cm) in diameter. Lie down on your back on a firm surface. Put the towel in the middle of your back across your spine. It should not be under your shoulder blades. Put your hands behind your head and let your elbows fall out to your sides. Hold for __________ seconds. Repeat __________ times. Complete this exercise __________ times a day. Strengthening exercises Isometric upper cervical flexion  Lie on your back with a thin pillow behind your head and a small rolled-up towel under your neck. Gently tuck your chin toward your chest and  nod your head down to look toward your feet. Do not lift your head off the pillow. Hold for __________ seconds. Release the tension slowly. Relax your neck muscles completely before you repeat this exercise. Repeat __________ times. Complete this exercise __________ times a day. Isometric cervical extension  Stand about 6 inches (15 cm) away from a wall, with your back facing the wall. Place a soft object, about 6-8 inches (15-20 cm) in diameter, between the back of your head and the wall. A soft object could be a small pillow, a ball, or a folded towel. Gently tilt your head back and press into the soft object. Keep your jaw and forehead relaxed. Hold for __________ seconds. Release the tension slowly. Relax your neck muscles completely before you repeat this exercise. Repeat __________ times. Complete this exercise __________ times a day. Posture and body mechanics Body mechanics refers to the movements and positions of your body while you do your daily activities. Posture is part of body mechanics. Good posture and healthy body mechanics can help to relieve stress in your body's tissues and joints. Good posture means that your spine is in its natural S-curve position (your spine is neutral), your shoulders are pulled back slightly, and your head is not tipped forward. The following are general guidelines for applying improved posture and body mechanics to your everyday activities. Sitting  When sitting, keep your spine neutral and keep your feet flat on the floor. Use a footrest, if necessary, and keep your thighs parallel to the floor. Avoid rounding   your shoulders, and avoid tilting your head forward. When working at a desk or a computer, keep your desk at a height where your hands are slightly lower than your elbows. Slide your chair under your desk so you are close enough to maintain good posture. When working at a computer, place your monitor at a height where you are looking straight ahead  and you do not have to tilt your head forward or downward to look at the screen. Standing  When standing, keep your spine neutral and keep your feet about hip-width apart. Keep a slight bend in your knees. Your ears, shoulders, and hips should line up. When you do a task in which you stand in one place for a long time, place one foot up on a stable object that is 2-4 inches (5-10 cm) high, such as a footstool. This helps keep your spine neutral. Resting When lying down and resting, avoid positions that are most painful for you. Try to support your neck in a neutral position. You can use a contour pillow or a small rolled-up towel. Your pillow should support your neck but not push on it. This information is not intended to replace advice given to you by your health care provider. Make sure you discuss any questions you have with your health care provider. Document Revised: 02/28/2021 Document Reviewed: 02/28/2021 Elsevier Patient Education  2023 [employer]  

## 2022-01-10 ENCOUNTER — Other Ambulatory Visit: Payer: Self-pay | Admitting: Family

## 2022-01-10 DIAGNOSIS — I480 Paroxysmal atrial fibrillation: Secondary | ICD-10-CM

## 2022-01-31 ENCOUNTER — Ambulatory Visit (INDEPENDENT_AMBULATORY_CARE_PROVIDER_SITE_OTHER): Payer: 59

## 2022-01-31 DIAGNOSIS — Z23 Encounter for immunization: Secondary | ICD-10-CM

## 2022-04-07 ENCOUNTER — Telehealth: Payer: 59 | Admitting: Nurse Practitioner

## 2022-05-17 ENCOUNTER — Telehealth: Payer: Self-pay | Admitting: Family

## 2022-05-17 DIAGNOSIS — Z0000 Encounter for general adult medical examination without abnormal findings: Secondary | ICD-10-CM

## 2022-05-17 NOTE — Telephone Encounter (Signed)
Orders placed.

## 2022-06-01 ENCOUNTER — Encounter (HOSPITAL_COMMUNITY): Payer: Self-pay | Admitting: *Deleted

## 2022-06-01 ENCOUNTER — Telehealth: Payer: Self-pay | Admitting: Family

## 2022-06-07 ENCOUNTER — Other Ambulatory Visit: Payer: Self-pay | Admitting: Family

## 2022-06-07 DIAGNOSIS — I480 Paroxysmal atrial fibrillation: Secondary | ICD-10-CM

## 2022-06-07 DIAGNOSIS — I10 Essential (primary) hypertension: Secondary | ICD-10-CM

## 2022-06-12 ENCOUNTER — Other Ambulatory Visit: Payer: 59

## 2022-06-12 DIAGNOSIS — Z0000 Encounter for general adult medical examination without abnormal findings: Secondary | ICD-10-CM

## 2022-06-13 LAB — CBC WITH DIFFERENTIAL/PLATELET
Basophils Absolute: 0 10*3/uL (ref 0.0–0.2)
Basos: 1 %
EOS (ABSOLUTE): 0.1 10*3/uL (ref 0.0–0.4)
Eos: 1 %
Hematocrit: 43.1 % (ref 34.0–46.6)
Hemoglobin: 14.3 g/dL (ref 11.1–15.9)
Immature Grans (Abs): 0 10*3/uL (ref 0.0–0.1)
Immature Granulocytes: 1 %
Lymphocytes Absolute: 1.4 10*3/uL (ref 0.7–3.1)
Lymphs: 31 %
MCH: 30.2 pg (ref 26.6–33.0)
MCHC: 33.2 g/dL (ref 31.5–35.7)
MCV: 91 fL (ref 79–97)
Monocytes Absolute: 0.4 10*3/uL (ref 0.1–0.9)
Monocytes: 9 %
Neutrophils Absolute: 2.6 10*3/uL (ref 1.4–7.0)
Neutrophils: 57 %
Platelets: 298 10*3/uL (ref 150–450)
RBC: 4.73 x10E6/uL (ref 3.77–5.28)
RDW: 11.8 % (ref 11.7–15.4)
WBC: 4.4 10*3/uL (ref 3.4–10.8)

## 2022-06-13 LAB — LIPID PANEL
Chol/HDL Ratio: 2.8 ratio (ref 0.0–4.4)
Cholesterol, Total: 144 mg/dL (ref 100–199)
HDL: 51 mg/dL (ref >39)
LDL Chol Calc (NIH): 76 mg/dL (ref 0–99)
Triglycerides: 87 mg/dL (ref 0–149)
VLDL Cholesterol Cal: 17 mg/dL (ref 5–40)

## 2022-06-13 LAB — CMP14+EGFR
ALT: 16 IU/L (ref 0–32)
AST: 18 IU/L (ref 0–40)
Albumin/Globulin Ratio: 1.9 (ref 1.2–2.2)
Albumin: 4.4 g/dL (ref 3.8–4.8)
Alkaline Phosphatase: 52 IU/L (ref 44–121)
BUN/Creatinine Ratio: 18 (ref 12–28)
BUN: 15 mg/dL (ref 8–27)
Bilirubin Total: 0.4 mg/dL (ref 0.0–1.2)
CO2: 22 mmol/L (ref 20–29)
Calcium: 9.5 mg/dL (ref 8.7–10.3)
Chloride: 102 mmol/L (ref 96–106)
Creatinine, Ser: 0.84 mg/dL (ref 0.57–1.00)
Globulin, Total: 2.3 g/dL (ref 1.5–4.5)
Glucose: 104 mg/dL — ABNORMAL HIGH (ref 70–99)
Potassium: 4.4 mmol/L (ref 3.5–5.2)
Sodium: 140 mmol/L (ref 134–144)
Total Protein: 6.7 g/dL (ref 6.0–8.5)
eGFR: 73 mL/min/{1.73_m2} (ref >59)

## 2022-06-13 LAB — TSH: TSH: 1.99 u[IU]/mL (ref 0.450–4.500)

## 2022-06-13 LAB — VITAMIN D 25 HYDROXY (VIT D DEFICIENCY, FRACTURES): Vit D, 25-Hydroxy: 45.5 ng/mL (ref 30.0–100.0)

## 2022-06-15 ENCOUNTER — Other Ambulatory Visit: Payer: Self-pay | Admitting: Family

## 2022-06-15 DIAGNOSIS — I480 Paroxysmal atrial fibrillation: Secondary | ICD-10-CM

## 2022-06-19 ENCOUNTER — Encounter: Payer: Self-pay | Admitting: Family

## 2022-06-19 ENCOUNTER — Ambulatory Visit (INDEPENDENT_AMBULATORY_CARE_PROVIDER_SITE_OTHER): Payer: 59 | Admitting: Family

## 2022-06-19 VITALS — BP 136/63 | HR 48 | Temp 97.4°F | Resp 20 | Ht 65.0 in | Wt 154.0 lb

## 2022-06-19 DIAGNOSIS — E785 Hyperlipidemia, unspecified: Secondary | ICD-10-CM

## 2022-06-19 DIAGNOSIS — R001 Bradycardia, unspecified: Secondary | ICD-10-CM

## 2022-06-19 DIAGNOSIS — Z0001 Encounter for general adult medical examination with abnormal findings: Secondary | ICD-10-CM

## 2022-06-19 DIAGNOSIS — Z0000 Encounter for general adult medical examination without abnormal findings: Secondary | ICD-10-CM

## 2022-06-19 DIAGNOSIS — E559 Vitamin D deficiency, unspecified: Secondary | ICD-10-CM

## 2022-06-19 DIAGNOSIS — F411 Generalized anxiety disorder: Secondary | ICD-10-CM

## 2022-06-19 DIAGNOSIS — I4819 Other persistent atrial fibrillation: Secondary | ICD-10-CM

## 2022-06-19 DIAGNOSIS — M810 Age-related osteoporosis without current pathological fracture: Secondary | ICD-10-CM

## 2022-06-19 DIAGNOSIS — I10 Essential (primary) hypertension: Secondary | ICD-10-CM

## 2022-06-19 DIAGNOSIS — M1990 Unspecified osteoarthritis, unspecified site: Secondary | ICD-10-CM

## 2022-06-19 NOTE — Patient Instructions (Signed)
Bradycardia, Adult Bradycardia is a slower-than-normal heartbeat. A normal resting heart rate for an adult ranges from 60 to 100 beats per minute. With bradycardia, the resting heart rate is less than 60 beats per minute. Bradycardia can prevent enough oxygen from reaching certain areas of your body when you are active. It can be serious if it keeps enough oxygen from reaching your brain and other parts of your body. Bradycardia is not a problem for everyone. For some healthy adults, a slow resting heart rate is normal. What are the causes? This condition may be caused by: A problem with the heart, including: A problem with the heart's electrical system, such as a heart block. With a heart block, electrical signals between the chambers of the heart are partially or completely blocked, so they are not able to work as they should. A problem with the heart's natural pacemaker (sinus node). Heart disease. A heart attack. Heart damage. Lyme disease. A heart infection. A heart condition that is present at birth (congenital heart defect). Certain medicines that treat heart conditions. Certain conditions, such as hypothyroidism and obstructive sleep apnea. Problems with the balance of chemicals and other substances, like potassium, in the blood. Trauma. Radiation therapy. What increases the risk? You are more likely to develop this condition if you: Are age 65 or older. Have high blood pressure (hypertension), high cholesterol (hyperlipidemia), or diabetes. Drink heavily, use tobacco or nicotine products, or use drugs. What are the signs or symptoms? Symptoms of this condition include: Light-headedness. Feeling faint or fainting. Fatigue and weakness. Trouble with activity or exercise. Shortness of breath. Chest pain (angina). Drowsiness. Confusion. Dizziness. How is this diagnosed? This condition may be diagnosed based on: Your symptoms. Your medical history. A physical exam. During  the exam, your health care provider will listen to your heartbeat and check your pulse. To confirm the diagnosis, your health care provider may order tests, such as: Blood tests. An electrocardiogram (ECG). This test records the heart's electrical activity. The test can show how fast your heart is beating and whether the heartbeat is steady. A test in which you wear a portable device (event recorder or Holter monitor) to record your heart's electrical activity while you go about your day. An exercise test. How is this treated? Treatment for this condition depends on the cause of the condition and how severe your symptoms are. Treatment may involve: Treatment of the underlying condition. Changing your medicines or how much medicine you take. Having a small, battery-operated device called a pacemaker implanted under the skin. When bradycardia occurs, this device can be used to increase your heart rate and help your heart beat in a regular rhythm. Follow these instructions at home: Lifestyle Manage any health conditions that contribute to bradycardia as told by your health care provider. Follow a heart-healthy diet. A nutrition specialist (dietitian) can help educate you about healthy food options and changes. Follow an exercise program that is approved by your health care provider. Maintain a healthy weight. Try to reduce or manage your stress, such as with yoga or meditation. If you need help reducing stress, ask your health care provider. Do not use any products that contain nicotine or tobacco. These products include cigarettes, chewing tobacco, and vaping devices, such as e-cigarettes. If you need help quitting, ask your health care provider. Do not use illegal drugs. Alcohol use If you drink alcohol: Limit how much you have to: 0-1 drink a day for women who are not pregnant. 0-2 drinks a day   for men. Know how much alcohol is in a drink. In the [place] one drink equals one 12 oz bottle of  beer (355 mL), one 5 oz glass of wine (148 mL), or one 1 oz glass of hard liquor (44 mL). General instructions Take over-the-counter and prescription medicines only as told by your health care provider. Keep all follow-up visits. This is important. How is this prevented? In some cases, bradycardia may be prevented by: Treating underlying medical problems. Stopping behaviors or medicines that can trigger the condition. Contact a health care provider if: You feel light-headed or dizzy. You almost faint. You feel weak or are easily fatigued during physical activity. You experience confusion or have memory problems. Get help right away if: You faint. You have chest pains or an irregular heartbeat (palpitations). You have trouble breathing. These symptoms may represent a serious problem that is an emergency. Do not wait to see if the symptoms will go away. Get medical help right away. Call your local emergency services (911 in the [place] Do not drive yourself to the hospital. Summary Bradycardia is a slower-than-normal heartbeat. With bradycardia, the resting heart rate is less than 60 beats per minute. Treatment for this condition depends on the cause. Manage any health conditions that contribute to bradycardia as told by your health care provider. Do not use any products that contain nicotine or tobacco. These products include cigarettes, chewing tobacco, and vaping devices, such as e-cigarettes. Keep all follow-up visits. This is important. This information is not intended to replace advice given to you by your health care provider. Make sure you discuss any questions you have with your health care provider. Document Revised: 08/01/2020 Document Reviewed: 08/01/2020 Elsevier Patient Education  2023 [employer]  

## 2022-06-19 NOTE — Progress Notes (Signed)
Subjective:    Patient ID: Kathryn Arnold, female    DOB: 07-08-1945, 77 y.o.   MRN: [id number]  Chief Complaint  Patient presents with   Annual Exam   Pt presents to the office today for CPE . Pt is followed by Cardiologists for A Fib  annually. Had an ablation on 10/31/16 and 2016.  She had cardioversion in 08/21. States since changing her diet and her procedures her A fib has improved.  She takes Xarelto 20 mg daily.    She has osteoporosis and takes Fosamax weekly. Her last Dexa scan was 06/17/21.  Hypertension This is a chronic problem. The current episode started more than 1 year ago. The problem has been resolved since onset. The problem is controlled. Associated symptoms include anxiety. Pertinent negatives include no malaise/fatigue, peripheral edema or shortness of breath. Risk factors for coronary artery disease include dyslipidemia and sedentary lifestyle. The current treatment provides moderate improvement. There is no history of CVA or heart failure.  Arthritis Presents for follow-up visit. She complains of pain and stiffness. The symptoms have been stable. Affected locations include the left MCP and right MCP. Her pain is at a severity of 1/10.  Hyperlipidemia This is a chronic problem. The current episode started more than 1 year ago. The problem is controlled. Recent lipid tests were reviewed and are normal. Pertinent negatives include no shortness of breath. Current antihyperlipidemic treatment includes statins. The current treatment provides moderate improvement of lipids. Risk factors for coronary artery disease include dyslipidemia, hypertension, post-menopausal and a sedentary lifestyle.  Anxiety Presents for follow-up visit. Patient reports no excessive worry, irritability, nervous/anxious behavior or shortness of breath. Symptoms occur most days. The severity of symptoms is mild.        Review of Systems  Constitutional:  Negative for irritability and  malaise/fatigue.  Respiratory:  Negative for shortness of breath.   Musculoskeletal:  Positive for arthritis and stiffness.  Psychiatric/Behavioral:  The patient is not nervous/anxious.   All other systems reviewed and are negative.      Objective:   Physical Exam Vitals reviewed.  Constitutional:      General: She is not in acute distress.    Appearance: She is well-developed.  HENT:     Head: Normocephalic and atraumatic.     Right Ear: Tympanic membrane normal.     Left Ear: Tympanic membrane normal.  Eyes:     Pupils: Pupils are equal, round, and reactive to light.  Neck:     Thyroid: No thyromegaly.  Cardiovascular:     Rate and Rhythm: Regular rhythm. Bradycardia present.     Heart sounds: Normal heart sounds. No murmur heard. Pulmonary:     Effort: Pulmonary effort is normal. No respiratory distress.     Breath sounds: Normal breath sounds. No wheezing.  Abdominal:     General: Bowel sounds are normal. There is no distension.     Palpations: Abdomen is soft.     Tenderness: There is no abdominal tenderness.  Musculoskeletal:        General: No tenderness. Normal range of motion.     Cervical back: Normal range of motion and neck supple.  Skin:    General: Skin is warm and dry.  Neurological:     Mental Status: She is alert and oriented to person, place, and time.     Cranial Nerves: No cranial nerve deficit.     Deep Tendon Reflexes: Reflexes are normal and symmetric.  Psychiatric:  Behavior: Behavior normal.        Thought Content: Thought content normal.        Judgment: Judgment normal.       BP 136/63   Pulse (!) 48   Temp (!) 97.4 F (36.3 C) (Oral)   Resp 20   Ht '5\' 5"'$  (1.651 m)   Wt 154 lb (69.9 kg)   SpO2 97%   BMI 25.63 kg/m      Assessment & Plan:  Kathryn Arnold comes in today with chief complaint of Annual Exam   Diagnosis and orders addressed:  1. Annual physical exam - CMP14+EGFR - CBC with Differential/Platelet - Lipid  panel - TSH  2. Hyperlipidemia with target LDL less than 100 - CMP14+EGFR - CBC with Differential/Platelet  3. Osteoporosis, unspecified osteoporosis type, unspecified pathological fracture presence - CMP14+EGFR - CBC with Differential/Platelet  4. Vitamin D deficiency - CMP14+EGFR - CBC with Differential/Platelet  5. Persistent atrial fibrillation (HCC) - CMP14+EGFR - CBC with Differential/Platelet  6. Essential hypertension, benign - CMP14+EGFR - CBC with Differential/Platelet  7. GAD (generalized anxiety disorder) - CMP14+EGFR - CBC with Differential/Platelet  8. Arthritis - CMP14+EGFR - CBC with Differential/Platelet  9. Bradycardia -Follow up with Cardiologists  - EKG 12-Lead - CMP14+EGFR - CBC with Differential/Platelet   Labs pending Health Maintenance reviewed Diet and exercise encouraged  Follow up plan: 6 months and make follow up with Cardiologists   Kathryn Dun, FNP

## 2022-06-30 ENCOUNTER — Encounter: Payer: 59 | Admitting: Family

## 2022-07-07 ENCOUNTER — Other Ambulatory Visit: Payer: Self-pay | Admitting: Family

## 2022-07-07 DIAGNOSIS — I480 Paroxysmal atrial fibrillation: Secondary | ICD-10-CM

## 2022-07-12 NOTE — Progress Notes (Signed)
Cardiology Office Note   Date:  07/14/2022   ID:  Kathryn Arnold, Kathryn Arnold [birth year], MRN [id number]  PCP:  Sharion Balloon, FNP  Cardiologist:   None   Chief Complaint  Patient presents with   Atrial Fibrillation      History of Present Illness: Kathryn Arnold is a 77 y.o. female who presents for follow up of atrial fib.  She has had two ablations and has been treated with flecainide.  She was seen in the At[facility] Fib [facility].  This is her first visit with me.   She had a Linq removed in May 2021.    Since I last saw her she has had increasing palpitations.  She is felt like she might be having A-fib.  She saw her primary provider and was told she was bradycardic.   I looked at an EKG it was a regular rhythm but with probably some atrial ectopy.  It was difficult to see P waves.  She wore an Apple watch but her friend lined her when she was feeling poorly 1 day and her blood pressure was 90/63 on the watch.  The heart rate was 98.  She is feeling her heart rate go high or low.  She says typically her symptoms are worse when she having have a bowel movement.  She is also been having decreased exercise tolerance.  She walks track.  She can go quarter of a mile most recently without being short of breath and fatigued and this is unusual.  She had some vague back and abdominal pressure.  She is not describing substernal chest pressure, neck or arm discomfort.  She is not describing new PND orthopnea.  She has had no syncope.  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   HSV-2 (herpes simplex virus 2) infection    Hypercholesteremia    Hyperlipidemia with target LDL less than 100    Hypertension    Osteoporosis, unspecified    Last DEXA 10/2011    Paroxysmal atrial fibrillation (HCC)    s/p ablation Afib and flutter  11/03/14   PONV (postoperative nausea and vomiting)    Vitamin D deficiency     Past Surgical History:  Procedure Laterality Date   ATRIAL FIBRILLATION ABLATION N/A 10/31/2016   Procedure: Atrial Fibrillation Ablation;  Surgeon: Thompson Grayer, MD;  Location: [facility];  Service: Cardiovascular;  Laterality: N/A;   BREAST LUMPECTOMY Left    DILATION AND CURETTAGE OF UTERUS     ELECTROPHYSIOLOGIC STUDY N/A 11/03/2014   Procedure: Atrial Fibrillation Ablation;  Surgeon: Thompson Grayer, MD;  Location: [facility];  Service: Cardiovascular;  Laterality: N/A;   implantable loop recorder placement  03/08/2018   [employer] Reveal LINQ implantation by Dr Rayann Heman for afib management post ablation   LAPAROSCOPY ABDOMEN DIAGNOSTIC  1990s   LOOP RECORDER REMOVAL  09/17/2019   MDT Reveal LINQ removed in office by Dr Rayann Heman   TEE WITHOUT CARDIOVERSION N/A 10/30/2016   Procedure: TRANSESOPHAGEAL ECHOCARDIOGRAM (TEE);  Surgeon: Sanda Klein, MD;  Location: [facility];  Service: Cardiovascular;  Laterality: N/A;   TUBAL LIGATION  1978     Current Outpatient Medications  Medication Sig Dispense Refill   alendronate (FOSAMAX) 70 MG tablet Take with a full glass of  water on an empty stomach weekly 12 tablet 1   Ascorbic Acid (VITAMIN C PO) Take one tablet by mouth daily     Calcium Citrate-Vitamin D (CITRACAL + D PO) Take 500 mg by mouth 2 (two) times daily.     diltiazem (CARDIZEM CD) 180 MG 24 hr capsule Take 1 capsule by mouth once daily 90 capsule 0   diltiazem (CARDIZEM) 60 MG tablet Take 0.5-1 tablets (30-60 mg total) by mouth every 6 (six) hours as needed (Heart rate over 100 as long as systolic blood pressure [id number]). 20 tablet 1   flecainide (TAMBOCOR) 50 MG tablet Take 1 tablet by mouth twice daily 180 tablet 0   glucosamine-chondroitin 500-400 MG tablet Take 2 tablets by mouth daily.     Magnesium 250 MG TABS Take 1 tablet by mouth every evening.     Multiple Vitamins-Minerals  (CENTRUM SILVER PO) Take 1 tablet by mouth daily.     rosuvastatin (CRESTOR) 20 MG tablet Take 1 tablet (20 mg total) by mouth at bedtime. 90 tablet 1   valACYclovir (VALTREX) 500 MG tablet Take 1 tablet (500 mg total) by mouth daily. (Patient taking differently: Take 500 mg by mouth daily. Every other day) 90 tablet 2   XARELTO 20 MG TABS tablet TAKE 1 TABLET BY MOUTH AT BEDTIME 90 tablet 0   baclofen (LIORESAL) 10 MG tablet Take 1 tablet (10 mg total) by mouth 3 (three) times daily. (Patient not taking: Reported on 07/14/2022) 30 each 0   vitamin E 1000 UNIT capsule Take 1,000 Units by mouth daily. (Patient not taking: Reported on 07/14/2022)     No current facility-administered medications for this visit.    Allergies:   Patient has no known allergies.    ROS:  Please see the history of present illness.   Otherwise, review of systems are positive for none.   All other systems are reviewed and negative.    PHYSICAL EXAM: VS:  BP (!) 162/72   Pulse (!) 54   Ht 5\' 5"  (1.651 m)   Wt 148 lb 9.6 oz (67.4 kg)   SpO2 98%   BMI 24.73 kg/m  , BMI Body mass index is 24.73 kg/m. GENERAL:  Well appearing NECK:  No jugular venous distention, waveform within normal limits, carotid upstroke brisk and symmetric, no bruits, no thyromegaly LUNGS:  Clear to auscultation bilaterally CHEST:  Unremarkable HEART:  PMI not displaced or sustained,S1 and S2 within normal limits, no S3, no S4, no clicks, no rubs, no murmurs ABD:  Flat, positive bowel sounds normal in frequency in pitch, no bruits, no rebound, no guarding, no midline pulsatile mass, no hepatomegaly, no splenomegaly EXT:  2 plus pulses throughout, no edema, no cyanosis no clubbing   EKG:  EKG is ordered today. The ekg ordered today demonstrates sinus bradycardia, rate 56, axis within normal limits, intervals within normal limits, no acute ST-T wave changes.   Recent Labs: 09/09/2021: Magnesium 2.1 06/12/2022: ALT 16; BUN 15; Creatinine, Ser  0.84; Hemoglobin 14.3; Platelets 298; Potassium 4.4; Sodium 140; TSH 1.990    Lipid Panel    Component Value Date/Time   CHOL 144 06/12/2022 0854   CHOL 136 08/22/2012 0847   TRIG 87 06/12/2022 0854   TRIG 195 (H) 12/18/2013 1103   TRIG 109 08/22/2012 0847   HDL 51 06/12/2022 0854   HDL 47 12/18/2013 1103   HDL 47 08/22/2012 0847   CHOLHDL 2.8 06/12/2022 0854   LDLCALC 76 06/12/2022 0854   LDLCALC  82 12/18/2013 1103   LDLCALC 67 08/22/2012 0847      Wt Readings from Last 3 Encounters:  07/14/22 148 lb 9.6 oz (67.4 kg)  06/19/22 154 lb (69.9 kg)  01/03/22 150 lb 3.2 oz (68.1 kg)      Other studies Reviewed: Additional studies/ records that were reviewed today include: Labs Review of the above records demonstrates:  Please see elsewhere in the note.     ASSESSMENT AND PLAN:  PAF:  Kathryn Arnold has a CHA2DS2 - VASc score of 3.  Given the complaints of variable heart rates and palpitations she will need a 2-week monitor.     HTN: Her blood pressure is elevated and she is going to keep a blood pressure diary.   Decreased exercise tolerance: She had nonobstructive disease 8 years ago on a CT but given her complaints I like to screen her with stress testing.  She does not think she be able to walk on a treadmill.  We will do PET scanning.   Current medicines are reviewed at length with the patient today.  The patient does not have concerns regarding medicines.  The following changes have been made:  None  Labs/ tests ordered today include:   Orders Placed This Encounter  Procedures   NM PET CT CARDIAC PERFUSION MULTI W/ABSOLUTE BLOODFLOW   LONG TERM MONITOR (3-14 DAYS)   EKG 12-Lead    Disposition:   FU with me after the above testing.     Signed, Minus Breeding, MD  07/14/2022 11:24 AM    [place]

## 2022-07-14 ENCOUNTER — Ambulatory Visit (INDEPENDENT_AMBULATORY_CARE_PROVIDER_SITE_OTHER): Payer: 59

## 2022-07-14 ENCOUNTER — Ambulatory Visit: Payer: 59 | Attending: Cardiology | Admitting: Cardiology

## 2022-07-14 ENCOUNTER — Encounter: Payer: Self-pay | Admitting: Cardiology

## 2022-07-14 VITALS — BP 162/72 | HR 54 | Ht 65.0 in | Wt 148.6 lb

## 2022-07-14 DIAGNOSIS — I480 Paroxysmal atrial fibrillation: Secondary | ICD-10-CM

## 2022-07-14 DIAGNOSIS — I10 Essential (primary) hypertension: Secondary | ICD-10-CM

## 2022-07-14 DIAGNOSIS — R0609 Other forms of dyspnea: Secondary | ICD-10-CM

## 2022-07-14 NOTE — Patient Instructions (Signed)
Medication Instructions:  Your physician recommends that you continue on your current medications as directed. Please refer to the Current Medication list given to you today.  *If you need a refill on your cardiac medications before your next appointment, please call your pharmacy*   Testing/Procedures: [facility] Monitor Instructions  Your physician has requested you wear a ZIO patch monitor for 14 days.  This is a single patch monitor. [employer] supplies one patch monitor per enrollment. Additional stickers are not available. Please do not apply patch if you will be having a Nuclear Stress Test,  Echocardiogram, Cardiac CT, MRI, or Chest Xray during the period you would be wearing the  monitor. The patch cannot be worn during these tests. You cannot remove and re-apply the  ZIO XT patch monitor.  Your ZIO patch monitor will be mailed 3 day USPS to your address on file. It may take 3-5 days  to receive your monitor after you have been enrolled.  Once you have received your monitor, please review the enclosed instructions. Your monitor  has already been registered assigning a specific monitor serial # to you.  Billing and Patient Assistance Program Information  We have supplied [employer] with any of your insurance information on file for billing purposes. [employer] offers a sliding scale Patient Assistance Program for patients that do not have  insurance, or whose insurance does not completely cover the cost of the ZIO monitor.  You must apply for the Patient Assistance Program to qualify for this discounted rate.  To apply, please call [employer] at [phone], select option 4, select option 2, ask to apply for  Patient Assistance Program. Theodore Demark will ask your household income, and how many people  are in your household. They will quote your out-of-pocket cost based on that information.  [employer] will also be able to set up a 60-month interest-free payment plan if needed.  Applying  the monitor   Shave hair from upper left chest.  Hold abrader disc by orange tab. Rub abrader in 40 strokes over the upper left chest as  indicated in your monitor instructions.  Clean area with 4 enclosed alcohol pads. Let dry.  Apply patch as indicated in monitor instructions. Patch will be placed under collarbone on left  side of chest with arrow pointing upward.  Rub patch adhesive wings for 2 minutes. Remove white label marked "1". Remove the white  label marked "2". Rub patch adhesive wings for 2 additional minutes.  While looking in a mirror, press and release button in center of patch. A small green light will  flash 3-4 times. This will be your only indicator that the monitor has been turned on.  Do not shower for the first 24 hours. You may shower after the first 24 hours.  Press the button if you feel a symptom. You will hear a small click. Record Date, Time and  Symptom in the Patient Logbook.  When you are ready to remove the patch, follow instructions on the last 2 pages of Patient  Logbook. Stick patch monitor onto the last page of Patient Logbook.  Place Patient Logbook in the blue and white box. Use locking tab on box and tape box closed  securely. The blue and white box has prepaid postage on it. Please place it in the mailbox as  soon as possible. Your physician should have your test results approximately 7 days after the  monitor has been [facility]  Call [facility]  at [phone] if you have questions regarding  your ZIO XT patch monitor. Call them immediately if you see an orange light blinking on your  monitor.  If your monitor falls off in less than 4 days, contact our Monitor department at [phone].  If your monitor becomes loose or falls off after 4 days call [employer] at [phone] for  suggestions on securing your monitor    Follow-Up: At [facility], you and your health needs are our priority.  As part  of our continuing mission to provide you with exceptional heart care, we have created designated Provider Care Teams.  These Care Teams include your primary Cardiologist (physician) and Advanced Practice Providers (APPs -  Physician Assistants and Nurse Practitioners) who all work together to provide you with the care you need, when you need it.  We recommend signing up for the patient portal called "MyChart".  Sign up information is provided on this After Visit Summary.  MyChart is used to connect with patients for Virtual Visits (Telemedicine).  Patients are able to view lab/test results, encounter notes, upcoming appointments, etc.  Non-urgent messages can be sent to your provider as well.   To learn more about what you can do with MyChart, go to [email or web address].    Your next appointment:   6-8 week(s)  Provider:   Minus Breeding, MD   Other Instructions How to Prepare for Your Cardiac PET/CT Stress Test:  1. Please do not take these medications before your test:   Medications that may interfere with the cardiac pharmacological stress agent (ex. nitrates - including erectile dysfunction medications, isosorbide mononitrate, tamulosin or beta-blockers) the day of the exam. (Erectile dysfunction medication should be held for at least 72 hrs prior to test) Theophylline containing medications for 12 hours. Dipyridamole 48 hours prior to the test. Your remaining medications may be taken with water.  2. Nothing to eat or drink, except water, 3 hours prior to arrival time.   NO caffeine/decaffeinated products, or chocolate 12 hours prior to arrival.  3. NO perfume, cologne or lotion  4. Total time is 1 to 2 hours; you may want to bring reading material for the waiting time.  5. Please report to Radiology at the [facility] Main Entrance 30 minutes early for your test.  [place], [place] [address]  Diabetic Preparation:  Hold oral medications. You may  take NPH and Lantus insulin. Do not take Humalog or Humulin R (Regular Insulin) the day of your test. Check blood sugars prior to leaving the house. If able to eat breakfast prior to 3 hour fasting, you may take all medications, including your insulin, Do not worry if you miss your breakfast dose of insulin - start at your next meal.  IF YOU THINK YOU MAY BE PREGNANT, OR ARE NURSING PLEASE INFORM THE TECHNOLOGIST.  In preparation for your appointment, medication and supplies will be purchased.  Appointment availability is limited, so if you need to cancel or reschedule, please call the Radiology Department at [phone]  24 hours in advance to avoid a cancellation fee of $100.00  What to Expect After you Arrive:  Once you arrive and check in for your appointment, you will be taken to a preparation room within the Radiology Department.  A technologist or Nurse will obtain your medical history, verify that you are correctly prepped for the exam, and explain the procedure.  Afterwards,  an IV will be started in your arm and electrodes will be  placed on your skin for EKG monitoring during the stress portion of the exam. Then you will be escorted to the PET/CT scanner.  There, staff will get you positioned on the scanner and obtain a blood pressure and EKG.  During the exam, you will continue to be connected to the EKG and blood pressure machines.  A small, safe amount of a radioactive tracer will be injected in your IV to obtain a series of pictures of your heart along with an injection of a stress agent.    After your Exam:  It is recommended that you eat a meal and drink a caffeinated beverage to counter act any effects of the stress agent.  Drink plenty of fluids for the remainder of the day and urinate frequently for the first couple of hours after the exam.  Your doctor will inform you of your test results within 7-10 business days.  For questions about your test or how to prepare for your test,  please call: Marchia Bond, Cardiac Imaging Nurse Navigator  Gordy Clement, Cardiac Imaging Nurse Navigator Office: [phone]

## 2022-07-14 NOTE — Progress Notes (Unsigned)
Enrolled patient for a 14 day Zio XT  monitor to be mailed to patients home  °

## 2022-07-18 DIAGNOSIS — R0609 Other forms of dyspnea: Secondary | ICD-10-CM

## 2022-07-18 DIAGNOSIS — I10 Essential (primary) hypertension: Secondary | ICD-10-CM

## 2022-07-18 DIAGNOSIS — I480 Paroxysmal atrial fibrillation: Secondary | ICD-10-CM

## 2022-07-31 ENCOUNTER — Ambulatory Visit: Payer: 59 | Admitting: Family

## 2022-08-02 ENCOUNTER — Ambulatory Visit: Payer: 59 | Admitting: Cardiology

## 2022-08-03 ENCOUNTER — Encounter: Payer: Self-pay | Admitting: Nurse Practitioner

## 2022-08-03 ENCOUNTER — Ambulatory Visit: Payer: 59 | Admitting: Internal Medicine

## 2022-08-03 ENCOUNTER — Ambulatory Visit: Payer: 59 | Admitting: Nurse Practitioner

## 2022-08-03 VITALS — BP 166/76 | HR 54 | Temp 97.9°F | Resp 20 | Ht 65.0 in | Wt 152.0 lb

## 2022-08-03 DIAGNOSIS — R42 Dizziness and giddiness: Secondary | ICD-10-CM

## 2022-08-03 MED ORDER — MECLIZINE HCL 25 MG PO TABS: 25.0000 mg | ORAL_TABLET | Freq: Three times a day (TID) | ORAL | 0 refills | Status: DC | PRN

## 2022-08-03 NOTE — Progress Notes (Signed)
Subjective:    Patient ID: Kathryn Arnold, female    DOB: 22-Nov-1945, 77 y.o.   MRN: [id number]   Chief Complaint: Dizziness (For weeks/)   Dizziness This is a new problem. The current episode started 1 to 4 weeks ago. The problem occurs 2 to 4 times per day. The problem has been waxing and waning. Associated symptoms include nausea and vertigo. Pertinent negatives include no chest pain, chills, congestion, coughing, headaches or visual change. She has tried nothing for the symptoms. The treatment provided no relief.    Patient Active Problem List   Diagnosis Date Noted   PONV (postoperative nausea and vomiting)    Complication of anesthesia    Arthritis    Paroxysmal atrial fibrillation 10/31/2016   GAD (generalized anxiety disorder) 01/28/2014   Essential hypertension, benign 01/20/2014   Atrial fibrillation 12/31/2013   HSV-2 (herpes simplex virus 2) infection 12/18/2013   Vitamin D deficiency 12/18/2013   Hyperlipidemia with target LDL less than 100 08/28/2012   Osteoporosis 08/28/2012       Review of Systems  Constitutional:  Negative for chills.  HENT:  Negative for congestion.   Eyes:  Negative for visual disturbance.  Respiratory:  Negative for cough.   Cardiovascular:  Negative for chest pain.  Gastrointestinal:  Positive for nausea.  Neurological:  Positive for dizziness and vertigo. Negative for headaches.       Objective:   Physical Exam Vitals reviewed.  Constitutional:      Appearance: Normal appearance.  Cardiovascular:     Rate and Rhythm: Normal rate and regular rhythm.  Pulmonary:     Effort: Pulmonary effort is normal.     Breath sounds: Normal breath sounds.  Skin:    General: Skin is warm and dry.  Neurological:     General: No focal deficit present.     Mental Status: She is alert and oriented to person, place, and time.     Cranial Nerves: No cranial nerve deficit.     Sensory: No sensory deficit.     Motor: No weakness.      Coordination: Coordination normal.     Gait: Gait normal.     Deep Tendon Reflexes: Reflexes normal.  Psychiatric:        Mood and Affect: Mood normal.        Behavior: Behavior normal.     BP (!) 166/76   Pulse (!) 54   Temp 97.9 F (36.6 C) (Temporal)   Resp 20   Ht 5\' 5"  (1.651 m)   Wt 152 lb (68.9 kg)   SpO2 98%   BMI 25.29 kg/m        Assessment & Plan:   [name] in today with chief complaint of Dizziness (For weeks/)   1. Vertigo Wanted to do steroid but last time she took steroid it put her in atrial fib We will just try meclizine for now. - meclizine (ANTIVERT) 25 MG tablet; Take 1 tablet (25 mg total) by mouth 3 (three) times daily as needed for dizziness.  Dispense: 30 tablet; Refill: 0    The above assessment and management plan was discussed with the patient. The patient verbalized understanding of and has agreed to the management plan. Patient is aware to call the clinic if symptoms persist or worsen. Patient is aware when to return to the clinic for a follow-up visit. Patient educated on when it is appropriate to go to the emergency department.   Mary-Margaret Daphine Deutscher, FNP

## 2022-08-03 NOTE — Patient Instructions (Signed)
Vertigo Vertigo is the feeling that you or the things around you are moving when they are not. This feeling can come and go at any time. Vertigo often goes away on its own. This condition can be dangerous if it happens when you are doing activities like driving or working with machines. Your doctor will do tests to find the cause of your vertigo. These tests will also help your doctor decide on the best treatment for you. Follow these instructions at home: Eating and drinking     Drink enough fluid to keep your pee (urine) pale yellow. Do not drink alcohol. Activity Return to your normal activities when your doctor says that it is safe. In the morning, first sit up on the side of the bed. When you feel okay, stand slowly while you hold onto something until you know that your balance is fine. Move slowly. Avoid sudden body or head movements or certain positions, as told by your doctor. Use a cane if you have trouble standing or walking. Sit down right away if you feel dizzy. Avoid doing any tasks or activities that can cause danger to you or others if you get dizzy. Avoid bending down if you feel dizzy. Place items in your home so that they are easy for you to reach without bending or leaning over. Do not drive or use machinery if you feel dizzy. General instructions Take over-the-counter and prescription medicines only as told by your doctor. Keep all follow-up visits. Contact a doctor if: Your medicine does not help your vertigo. Your problems get worse or you have new symptoms. You have a fever. You feel like you may vomit (nauseous), or this feeling gets worse. You start to vomit. Your family or friends see changes in how you act. You lose feeling (have numbness) in part of your body. You feel prickling and tingling in a part of your body. Get help right away if: You are always dizzy. You faint. You get very bad headaches. You get a stiff neck. Bright light starts to bother  you. You have trouble moving or talking. You feel weak in your hands, arms, or legs. You have changes in your hearing or in how you see (vision). These symptoms may be an emergency. Get help right away. Call your local emergency services (911 in the [place] Do not wait to see if the symptoms will go away. Do not drive yourself to the hospital. Summary Vertigo is the feeling that you or the things around you are moving when they are not. Your doctor will do tests to find the cause of your vertigo. You may be told to avoid some tasks, positions, or movements. Contact a doctor if your medicine is not helping, or if you have a fever, new symptoms, or a change in how you act. Get help right away if you get very bad headaches, or if you have changes in how you speak, hear, or see. This information is not intended to replace advice given to you by your health care provider. Make sure you discuss any questions you have with your health care provider. Document Revised: 03/10/2020 Document Reviewed: 03/10/2020 Elsevier Patient Education  2023 Elsevier Inc.  

## 2022-08-04 ENCOUNTER — Ambulatory Visit: Payer: 59 | Admitting: Cardiology

## 2022-08-07 ENCOUNTER — Telehealth: Payer: Self-pay | Admitting: Cardiology

## 2022-08-07 NOTE — Telephone Encounter (Signed)
Spoke with Irving Burton and received critical call re pt's monitor results  Episode of bradycardia HR 38 for 30 seconds strip number 5 page 26 and also 23 runs of SVT . Will forward to Dr Antoine Poche

## 2022-08-07 NOTE — Telephone Encounter (Signed)
Emily - [facility] calling to give abnormal result 

## 2022-08-07 NOTE — Telephone Encounter (Signed)
Kathryn Arnold got disconnected, transferred to triage

## 2022-08-11 ENCOUNTER — Ambulatory Visit: Payer: 59 | Admitting: Family

## 2022-08-11 ENCOUNTER — Encounter: Payer: Self-pay | Admitting: Family

## 2022-08-11 VITALS — Temp 97.6°F | Wt 154.0 lb

## 2022-08-11 DIAGNOSIS — R42 Dizziness and giddiness: Secondary | ICD-10-CM | POA: Diagnosis not present

## 2022-08-11 MED ORDER — CETIRIZINE HCL 10 MG PO TABS: 10.0000 mg | ORAL_TABLET | Freq: Every day | ORAL | 1 refills | Status: DC

## 2022-08-11 MED ORDER — FLUTICASONE PROPIONATE 50 MCG/ACT NA SUSP: 2.0000 | Freq: Every day | NASAL | 6 refills | Status: DC

## 2022-08-11 NOTE — Patient Instructions (Signed)
Dizziness Dizziness is a common problem. It is a feeling of unsteadiness or light-headedness. You may feel like you are about to faint. Dizziness can lead to injury if you stumble or fall. Anyone can become dizzy, but dizziness is more common in older adults. This condition can be caused by a number of things, including medicines, dehydration, or illness. Follow these instructions at home: Eating and drinking  Drink enough fluid to keep your urine pale yellow. This helps to keep you from becoming dehydrated. Try to drink more clear fluids, such as water. Do not drink alcohol. Limit your caffeine intake if told to do so by your health care provider. Check ingredients and nutrition facts to see if a food or beverage contains caffeine. Limit your salt (sodium) intake if told to do so by your health care provider. Check ingredients and nutrition facts to see if a food or beverage contains sodium. Activity  Avoid making quick movements. Rise slowly from chairs and steady yourself until you feel okay. In the morning, first sit up on the side of the bed. When you feel okay, stand slowly while you hold onto something until you know that your balance is good. If you need to stand in one place for a long time, move your legs often. Tighten and relax the muscles in your legs while you are standing. Do not drive or use machinery if you feel dizzy. Avoid bending down if you feel dizzy. Place items in your home so that they are easy for you to reach without leaning over. Lifestyle Do not use any products that contain nicotine or tobacco. These products include cigarettes, chewing tobacco, and vaping devices, such as e-cigarettes. If you need help quitting, ask your health care provider. Try to reduce your stress level by using methods such as yoga or meditation. Talk with your health care provider if you need help to manage your stress. General instructions Watch your dizziness for any changes. Take  over-the-counter and prescription medicines only as told by your health care provider. Talk with your health care provider if you think that your dizziness is caused by a medicine that you are taking. Tell a friend or a family member that you are feeling dizzy. If he or she notices any changes in your behavior, have this person call your health care provider. Keep all follow-up visits. This is important. Contact a health care provider if: Your dizziness does not go away or you have new symptoms. Your dizziness or light-headedness gets worse. You feel nauseous. You have reduced hearing. You have a fever. You have neck pain or a stiff neck. Your dizziness leads to an injury or a fall. Get help right away if: You vomit or have diarrhea and are unable to eat or drink anything. You have problems talking, walking, swallowing, or using your arms, hands, or legs. You feel generally weak. You have any bleeding. You are not thinking clearly or you have trouble forming sentences. It may take a friend or family member to notice this. You have chest pain, abdominal pain, shortness of breath, or sweating. Your vision changes or you develop a severe headache. These symptoms may represent a serious problem that is an emergency. Do not wait to see if the symptoms will go away. Get medical help right away. Call your local emergency services (911 in the [place] Do not drive yourself to the hospital. Summary Dizziness is a feeling of unsteadiness or light-headedness. This condition can be caused by a number of   things, including medicines, dehydration, or illness. Anyone can become dizzy, but dizziness is more common in older adults. Drink enough fluid to keep your urine pale yellow. Do not drink alcohol. Avoid making quick movements if you feel dizzy. Monitor your dizziness for any changes. This information is not intended to replace advice given to you by your health care provider. Make sure you discuss any  questions you have with your health care provider. Document Revised: 03/15/2020 Document Reviewed: 03/15/2020 Elsevier Patient Education  2023 Elsevier Inc.  

## 2022-08-11 NOTE — Progress Notes (Signed)
Subjective:    Patient ID: Kathryn Arnold, female    DOB: 11-17-1945, 77 y.o.   MRN: [id number]  Chief Complaint  Patient presents with   Dizziness    Only in the mornings. She is fine called EMS last week they ran test and it was fine. She seen mmm 04/11. After she eats she feels better.   Pt presents to the office today with complaints of dizziness. States she had to call EMS because of the dizziness. Her glucose and VS were stable. She states her dizziness improves later in the evening and after she eats.   She was seen in office on 08/03/22 and given Meclizine 25 mg TID. States this did not help. Reports she was told she has vertigo, but states she has had this before and this does not feel the same. Dizziness This is a new problem. The current episode started 1 to 4 weeks ago. The problem occurs constantly. The problem has been gradually worsening. Associated symptoms include fatigue. Pertinent negatives include no chills, congestion, coughing, fever, headaches, sore throat, swollen glands, urinary symptoms, vertigo, visual change or vomiting. She has tried rest for the symptoms. The treatment provided mild relief.      Review of Systems  Constitutional:  Positive for fatigue. Negative for chills and fever.  HENT:  Negative for congestion and sore throat.   Respiratory:  Negative for cough.   Gastrointestinal:  Negative for vomiting.  Neurological:  Positive for dizziness. Negative for vertigo and headaches.  All other systems reviewed and are negative.      Objective:   Physical Exam Vitals reviewed.  Constitutional:      General: She is not in acute distress.    Appearance: She is well-developed.  HENT:     Head: Normocephalic and atraumatic.     Right Ear: Tympanic membrane normal.     Left Ear: Tympanic membrane normal.  Eyes:     Pupils: Pupils are equal, round, and reactive to light.  Neck:     Thyroid: No thyromegaly.  Cardiovascular:     Rate and Rhythm:  Normal rate and regular rhythm.     Heart sounds: Normal heart sounds. No murmur heard. Pulmonary:     Effort: Pulmonary effort is normal. No respiratory distress.     Breath sounds: Normal breath sounds. No wheezing.  Abdominal:     General: Bowel sounds are normal. There is no distension.     Palpations: Abdomen is soft.     Tenderness: There is no abdominal tenderness.  Musculoskeletal:        General: No tenderness. Normal range of motion.     Cervical back: Normal range of motion and neck supple.  Skin:    General: Skin is warm and dry.  Neurological:     Mental Status: She is alert and oriented to person, place, and time.     Cranial Nerves: No cranial nerve deficit.     Deep Tendon Reflexes: Reflexes are normal and symmetric.  Psychiatric:        Behavior: Behavior normal.        Thought Content: Thought content normal.        Judgment: Judgment normal.      Temp 97.6 F (36.4 C) (Temporal)   Wt 154 lb (69.9 kg)   SpO2 94%   BMI 25.63 kg/m      Assessment & Plan:  Kathryn Arnold comes in today with chief complaint of Dizziness (Only in the  mornings. She is fine called EMS last week they ran test and it was fine. She seen mmm 04/11. After she eats she feels better.)   Diagnosis and orders addressed:  1. Dizziness Start flonase and zyrtec Fall precautions discussed Labs pending  If symptoms do not improve, will do referral to ENT - CMP14+EGFR - Anemia Profile B - TSH - cetirizine (ZYRTEC ALLERGY) 10 MG tablet; Take 1 tablet (10 mg total) by mouth daily.  Dispense: 90 tablet; Refill: 1 - fluticasone (FLONASE) 50 MCG/ACT nasal spray; Place 2 sprays into both nostrils daily.  Dispense: 16 g; Refill: 6  [name], FNP

## 2022-08-12 LAB — ANEMIA PROFILE B
Basophils Absolute: 0 10*3/uL (ref 0.0–0.2)
Basos: 1 %
EOS (ABSOLUTE): 0.1 10*3/uL (ref 0.0–0.4)
Eos: 1 %
Ferritin: 81 ng/mL (ref 15–150)
Folate: >20 ng/mL (ref >3.0)
Hematocrit: 40.4 % (ref 34.0–46.6)
Hemoglobin: 13.9 g/dL (ref 11.1–15.9)
Immature Grans (Abs): 0 10*3/uL (ref 0.0–0.1)
Immature Granulocytes: 0 %
Iron Saturation: 21 % (ref 15–55)
Iron: 77 ug/dL (ref 27–139)
Lymphocytes Absolute: 1.7 10*3/uL (ref 0.7–3.1)
Lymphs: 32 %
MCH: 30.2 pg (ref 26.6–33.0)
MCHC: 34.4 g/dL (ref 31.5–35.7)
MCV: 88 fL (ref 79–97)
Monocytes Absolute: 0.4 10*3/uL (ref 0.1–0.9)
Monocytes: 8 %
Neutrophils Absolute: 3 10*3/uL (ref 1.4–7.0)
Neutrophils: 58 %
Platelets: 250 10*3/uL (ref 150–450)
RBC: 4.6 x10E6/uL (ref 3.77–5.28)
RDW: 12.3 % (ref 11.7–15.4)
Retic Ct Pct: 1.5 % (ref 0.6–2.6)
Total Iron Binding Capacity: 371 ug/dL (ref 250–450)
UIBC: 294 ug/dL (ref 118–369)
Vitamin B-12: 686 pg/mL (ref 232–1245)
WBC: 5.3 10*3/uL (ref 3.4–10.8)

## 2022-08-12 LAB — CMP14+EGFR
ALT: 16 IU/L (ref 0–32)
AST: 22 IU/L (ref 0–40)
Albumin/Globulin Ratio: 2 (ref 1.2–2.2)
Albumin: 4.5 g/dL (ref 3.8–4.8)
Alkaline Phosphatase: 50 IU/L (ref 44–121)
BUN/Creatinine Ratio: 20 (ref 12–28)
BUN: 17 mg/dL (ref 8–27)
Bilirubin Total: 0.3 mg/dL (ref 0.0–1.2)
CO2: 23 mmol/L (ref 20–29)
Calcium: 9.2 mg/dL (ref 8.7–10.3)
Chloride: 104 mmol/L (ref 96–106)
Creatinine, Ser: 0.86 mg/dL (ref 0.57–1.00)
Globulin, Total: 2.3 g/dL (ref 1.5–4.5)
Glucose: 101 mg/dL — ABNORMAL HIGH (ref 70–99)
Potassium: 4.5 mmol/L (ref 3.5–5.2)
Sodium: 140 mmol/L (ref 134–144)
Total Protein: 6.8 g/dL (ref 6.0–8.5)
eGFR: 71 mL/min/{1.73_m2} (ref >59)

## 2022-08-12 LAB — TSH: TSH: 1.63 u[IU]/mL (ref 0.450–4.500)

## 2022-08-23 ENCOUNTER — Other Ambulatory Visit: Payer: Self-pay | Admitting: Family

## 2022-08-23 DIAGNOSIS — E785 Hyperlipidemia, unspecified: Secondary | ICD-10-CM

## 2022-09-01 ENCOUNTER — Other Ambulatory Visit: Payer: Self-pay | Admitting: Family

## 2022-09-01 DIAGNOSIS — I10 Essential (primary) hypertension: Secondary | ICD-10-CM

## 2022-09-01 DIAGNOSIS — I480 Paroxysmal atrial fibrillation: Secondary | ICD-10-CM

## 2022-09-01 NOTE — Telephone Encounter (Signed)
  Pt aware of monitor results ./cy No prolonged bradycardic events.  No indication for any bradycardia therapy.  Short runs of bradycardia.  Her symptoms seem to correlate with the PACs.  No change in therapy.  PET scan pending.

## 2022-09-03 NOTE — Progress Notes (Unsigned)
  Cardiology Office Note:   Date:  09/04/2022  ID:  Kathryn Arnold, DOB 1945/09/20, MRN [id number]  History of Present Illness:   Kathryn Arnold is a 77 y.o. female  who presents for follow up of atrial fib.  She has had two ablations and has been treated with flecainide.  She was seen in the [facility].   She had a Linq removed in May 2021.  At the last visit she was having increased palpitations. She wore a two week monitor.  This demonstrated  no atrial fib but some short runs of bradycardia not associated with symptoms.  She also had decreased exercise tolerance and was to have a PET.  However, she started feeling better.  She had her allergies treated.  She was having some hypoglycemia and started drinking small meals and recognizing that if she ate when she felt like that her symptoms got better.  She has been walking the track and she is not having any of the exercise intolerance that she was having.  She feels much better.  She is not having any chest pain, neck or arm discomfort.  She had no symptomatic palpitations and no presyncope or syncope.   ROS: As stated in the HPI and negative for all other systems.  Studies Reviewed:    EKG: NA    Risk Assessment/Calculations:    CHA2DS2-VASc Score = 3  This indicates a 3.2% annual risk of stroke. The patient's score is based upon: CHF History: 0 HTN History: 1 Diabetes History: 0 Stroke History: 0 Vascular Disease History: 0 Age Score: 1 Gender Score: 1       Physical Exam:   VS:  BP (!) 168/72   Pulse (!) 54   Ht 5\' 5"  (1.651 m)   Wt 153 lb (69.4 kg)   SpO2 95%   BMI 25.46 kg/m    Wt Readings from Last 3 Encounters:  09/04/22 153 lb (69.4 kg)  08/11/22 154 lb (69.9 kg)  08/03/22 152 lb (68.9 kg)     GEN: Well nourished, well developed in no acute distress NECK: No JVD; No carotid bruits CARDIAC: RRR, no murmurs, rubs, gallops RESPIRATORY:  Clear to auscultation without rales, wheezing or rhonchi  ABDOMEN:  Soft, non-tender, non-distended EXTREMITIES:  No edema; No deformity   ASSESSMENT AND PLAN:    PAF:  Ms. Kathryn Arnold has a CHA2DS2 - VASc score of 3.  She tolerates anticoagulation has had no symptomatic paroxysms.  No change in therapy.   HTN: Her blood pressure is elevated but she says this is unusual.  She is going to keep a blood pressure diary and we might need to make med titration based on these home readings.  Decreased exercise tolerance: She was to have a PET scheduled.  However, since her symptoms have improved she wants to hold off on this and I think is reasonable.  She had nonobstructive coronary disease in the past.  She will continue with primary risk reduction.       Signed, Rollene Rotunda, MD

## 2022-09-04 ENCOUNTER — Ambulatory Visit: Payer: 59 | Attending: Cardiology | Admitting: Cardiology

## 2022-09-04 ENCOUNTER — Encounter: Payer: Self-pay | Admitting: Cardiology

## 2022-09-04 VITALS — BP 168/72 | HR 54 | Ht 65.0 in | Wt 153.0 lb

## 2022-09-04 DIAGNOSIS — I480 Paroxysmal atrial fibrillation: Secondary | ICD-10-CM | POA: Diagnosis not present

## 2022-09-04 NOTE — Patient Instructions (Addendum)
Medication Instructions:  Your physician recommends that you continue on your current medications as directed. Please refer to the Current Medication list given to you today.  *If you need a refill on your cardiac medications before your next appointment, please call your pharmacy*  Follow-Up: At [facility], you and your health needs are our priority.  As part of our continuing mission to provide you with exceptional heart care, we have created designated Provider Care Teams.  These Care Teams include your primary Cardiologist (physician) and Advanced Practice Providers (APPs -  Physician Assistants and Nurse Practitioners) who all work together to provide you with the care you need, when you need it.  We recommend signing up for the patient portal called "MyChart".  Sign up information is provided on this After Visit Summary.  MyChart is used to connect with patients for Virtual Visits (Telemedicine).  Patients are able to view lab/test results, encounter notes, upcoming appointments, etc.  Non-urgent messages can be sent to your provider as well.   To learn more about what you can do with MyChart, go to [email or web address].    Your next appointment:   12 month(s)  Provider:   Rollene Rotunda, MD (in [place])   Other Instructions Please keep a blood pressure log for 2 weeks. You can call these readings in to our office.   HOW TO TAKE YOUR BLOOD PRESSURE: Rest 5 minutes before taking your blood pressure. Don't smoke or drink caffeinated beverages for at least 30 minutes before. Take your blood pressure before (not after) you eat. Sit comfortably with your back supported and both feet on the floor (don't cross your legs). Elevate your arm to heart level on a table or a desk. Use the proper sized cuff. It should fit smoothly and snugly around your bare upper arm. There should be enough room to slip a fingertip under the cuff. The bottom edge of the cuff should be 1 inch above  the crease of the elbow. Ideally, take 3 measurements at one sitting and record the average.

## 2022-09-13 ENCOUNTER — Ambulatory Visit: Payer: 59 | Admitting: Cardiology

## 2022-09-15 ENCOUNTER — Telehealth: Payer: Self-pay | Admitting: Family

## 2022-09-15 ENCOUNTER — Other Ambulatory Visit: Payer: Self-pay

## 2022-09-15 DIAGNOSIS — I480 Paroxysmal atrial fibrillation: Secondary | ICD-10-CM

## 2022-09-15 MED ORDER — RIVAROXABAN 20 MG PO TABS
20.0000 mg | ORAL_TABLET | Freq: Every day | ORAL | 1 refills | Status: DC
Start: 2022-09-15 — End: 2023-03-07

## 2022-09-15 NOTE — Telephone Encounter (Signed)
Rx sent to [facility] per patients request. Patient notified and verbalized understanding

## 2022-09-15 NOTE — Telephone Encounter (Signed)
  Prescription Request  09/15/2022  Is this a "Controlled Substance" medicine?   Have you seen your PCP in the last 2 weeks? LOV 4/19  If YES, route message to pool  -  If NO, patient needs to be scheduled for appointment.  What is the name of the medication or equipment? XARELTO 20 MG TABS tablet  Have you contacted your pharmacy to request a refill? yes   Which pharmacy would you like this sent to?  [facility] [address] [place] [address]    Only has one pill left    Patient notified that their request is being sent to the clinical staff for review and that they should receive a response within 2 business days.

## 2022-10-01 ENCOUNTER — Other Ambulatory Visit: Payer: Self-pay | Admitting: Family

## 2022-10-01 DIAGNOSIS — I480 Paroxysmal atrial fibrillation: Secondary | ICD-10-CM

## 2022-10-02 ENCOUNTER — Encounter: Payer: Self-pay | Admitting: Family

## 2022-10-02 NOTE — Telephone Encounter (Signed)
LMTCB to schedule appt Letter mailed 

## 2022-10-02 NOTE — Telephone Encounter (Signed)
[facility] NTBS in Aug for 6 mos FU RF sent to [facility]

## 2022-10-09 ENCOUNTER — Ambulatory Visit: Payer: 59 | Admitting: Family

## 2022-10-09 ENCOUNTER — Other Ambulatory Visit: Payer: Self-pay | Admitting: Nurse Practitioner

## 2022-10-09 DIAGNOSIS — R42 Dizziness and giddiness: Secondary | ICD-10-CM

## 2022-10-10 ENCOUNTER — Ambulatory Visit: Payer: 59 | Admitting: Family

## 2022-10-10 ENCOUNTER — Encounter: Payer: Self-pay | Admitting: Family

## 2022-10-10 VITALS — Temp 98.0°F | Ht 65.0 in

## 2022-10-10 DIAGNOSIS — F411 Generalized anxiety disorder: Secondary | ICD-10-CM

## 2022-10-10 DIAGNOSIS — I10 Essential (primary) hypertension: Secondary | ICD-10-CM

## 2022-10-10 DIAGNOSIS — M1990 Unspecified osteoarthritis, unspecified site: Secondary | ICD-10-CM | POA: Diagnosis not present

## 2022-10-10 DIAGNOSIS — I4819 Other persistent atrial fibrillation: Secondary | ICD-10-CM

## 2022-10-10 DIAGNOSIS — E559 Vitamin D deficiency, unspecified: Secondary | ICD-10-CM

## 2022-10-10 DIAGNOSIS — R42 Dizziness and giddiness: Secondary | ICD-10-CM

## 2022-10-10 DIAGNOSIS — M810 Age-related osteoporosis without current pathological fracture: Secondary | ICD-10-CM

## 2022-10-10 DIAGNOSIS — E785 Hyperlipidemia, unspecified: Secondary | ICD-10-CM

## 2022-10-10 MED ORDER — MECLIZINE HCL 25 MG PO TABS
25.0000 mg | ORAL_TABLET | Freq: Three times a day (TID) | ORAL | 1 refills | Status: DC | PRN
Start: 2022-10-10 — End: 2023-06-21

## 2022-10-10 NOTE — Progress Notes (Signed)
Subjective:    Patient ID: Kathryn Arnold, female    DOB: Mar 07, 1946, 77 y.o.   MRN: [id number]  Chief Complaint  Patient presents with   Dizziness    Started taking new medicine few weeks ago, wasn't taking 3x a day (only once), started taking 2x a day and dizziness stopped   Pt presents to the office today with dizziness that started last week. She started her antivert 25 mg BID. Now her dizziness has resolved.   Pt is followed by Cardiologists for A Fib  annually. Had an ablation on 10/31/16 and 2016.  She had cardioversion in 08/21. States since changing her diet and her procedures her A fib has improved.  She takes Xarelto 20 mg daily.    She has osteoporosis and takes Fosamax weekly. Her last Dexa scan was 06/17/21.  Dizziness This is a recurrent problem. The current episode started 1 to 4 weeks ago. The problem has been waxing and waning.  Hypertension This is a chronic problem. The current episode started more than 1 year ago. The problem has been resolved since onset. The problem is controlled. Associated symptoms include anxiety. Pertinent negatives include no malaise/fatigue, peripheral edema or shortness of breath. Risk factors for coronary artery disease include dyslipidemia and sedentary lifestyle. The current treatment provides moderate improvement.  Arthritis Presents for follow-up visit. She complains of pain and stiffness. The symptoms have been stable. Affected locations include the right MCP and left MCP. Her pain is at a severity of 0/10.  Hyperlipidemia This is a chronic problem. The current episode started more than 1 year ago. The problem is controlled. Recent lipid tests were reviewed and are normal. Pertinent negatives include no shortness of breath. Current antihyperlipidemic treatment includes statins. The current treatment provides moderate improvement of lipids. Risk factors for coronary artery disease include dyslipidemia, hypertension, a sedentary lifestyle and  post-menopausal.  Anxiety Presents for follow-up visit. Symptoms include dizziness, excessive worry, nervous/anxious behavior and restlessness. Patient reports no shortness of breath. Symptoms occur occasionally.        Review of Systems  Constitutional:  Negative for malaise/fatigue.  Respiratory:  Negative for shortness of breath.   Musculoskeletal:  Positive for arthritis and stiffness.  Neurological:  Positive for dizziness.  Psychiatric/Behavioral:  The patient is nervous/anxious.   All other systems reviewed and are negative.      Objective:   Physical Exam Vitals reviewed.  Constitutional:      General: She is not in acute distress.    Appearance: She is well-developed.  HENT:     Head: Normocephalic and atraumatic.     Right Ear: Tympanic membrane normal.     Left Ear: Tympanic membrane normal.  Eyes:     Pupils: Pupils are equal, round, and reactive to light.  Neck:     Thyroid: No thyromegaly.  Cardiovascular:     Rate and Rhythm: Normal rate and regular rhythm.     Heart sounds: Normal heart sounds. No murmur heard. Pulmonary:     Effort: Pulmonary effort is normal. No respiratory distress.     Breath sounds: Normal breath sounds. No wheezing.  Abdominal:     General: Bowel sounds are normal. There is no distension.     Palpations: Abdomen is soft.     Tenderness: There is no abdominal tenderness.  Musculoskeletal:        General: No tenderness. Normal range of motion.     Cervical back: Normal range of motion and neck supple.  Skin:    General: Skin is warm and dry.  Neurological:     Mental Status: She is alert and oriented to person, place, and time.     Cranial Nerves: No cranial nerve deficit.     Deep Tendon Reflexes: Reflexes are normal and symmetric.  Psychiatric:        Behavior: Behavior normal.        Thought Content: Thought content normal.        Judgment: Judgment normal.       Temp 98 F (36.7 C) (Temporal)   Ht 5\' 5"  (1.651 m)    SpO2 96%   BMI 25.46 kg/m      Assessment & Plan:  Kathryn Arnold comes in today with chief complaint of Dizziness (Started taking new medicine few weeks ago, wasn't taking 3x a day (only once), started taking 2x a day and dizziness stopped)   Diagnosis and orders addressed:  1. Persistent atrial fibrillation (HCC) - CMP14+EGFR  2. Arthriti - CMP14+EGFR  3. Essential hypertension, benign  - CMP14+EGFR  4. GAD (generalized anxiety disorder) - CMP14+EGFR  5. Hyperlipidemia with target LDL less than 100 - CMP14+EGFR  6. Osteoporosis, unspecified osteoporosis type, unspecified pathological fracture presence - CMP14+EGFR  7. Vitamin D deficiency  - CMP14+EGFR  8. Dizziness Fall preventions  Antivert TID prn  - meclizine (ANTIVERT) 25 MG tablet; Take 1 tablet (25 mg total) by mouth 3 (three) times daily as needed for dizziness.  Dispense: 90 tablet; Refill: 1 - CMP14+EGFR   Labs pending Health Maintenance reviewed Diet and exercise encouraged  Follow up plan: 6 months   [name], FNP

## 2022-10-10 NOTE — Patient Instructions (Signed)
Dizziness Dizziness is a common problem. It is a feeling of unsteadiness or light-headedness. You may feel like you are about to faint. Dizziness can lead to injury if you stumble or fall. Anyone can become dizzy, but dizziness is more common in older adults. This condition can be caused by a number of things, including medicines, dehydration, or illness. Follow these instructions at home: Eating and drinking  Drink enough fluid to keep your urine pale yellow. This helps to keep you from becoming dehydrated. Try to drink more clear fluids, such as water. Do not drink alcohol. Limit your caffeine intake if told to do so by your health care provider. Check ingredients and nutrition facts to see if a food or beverage contains caffeine. Limit your salt (sodium) intake if told to do so by your health care provider. Check ingredients and nutrition facts to see if a food or beverage contains sodium. Activity  Avoid making quick movements. Rise slowly from chairs and steady yourself until you feel okay. In the morning, first sit up on the side of the bed. When you feel okay, stand slowly while you hold onto something until you know that your balance is good. If you need to stand in one place for a long time, move your legs often. Tighten and relax the muscles in your legs while you are standing. Do not drive or use machinery if you feel dizzy. Avoid bending down if you feel dizzy. Place items in your home so that they are easy for you to reach without leaning over. Lifestyle Do not use any products that contain nicotine or tobacco. These products include cigarettes, chewing tobacco, and vaping devices, such as e-cigarettes. If you need help quitting, ask your health care provider. Try to reduce your stress level by using methods such as yoga or meditation. Talk with your health care provider if you need help to manage your stress. General instructions Watch your dizziness for any changes. Take  over-the-counter and prescription medicines only as told by your health care provider. Talk with your health care provider if you think that your dizziness is caused by a medicine that you are taking. Tell a friend or a family member that you are feeling dizzy. If he or she notices any changes in your behavior, have this person call your health care provider. Keep all follow-up visits. This is important. Contact a health care provider if: Your dizziness does not go away or you have new symptoms. Your dizziness or light-headedness gets worse. You feel nauseous. You have reduced hearing. You have a fever. You have neck pain or a stiff neck. Your dizziness leads to an injury or a fall. Get help right away if: You vomit or have diarrhea and are unable to eat or drink anything. You have problems talking, walking, swallowing, or using your arms, hands, or legs. You feel generally weak. You have any bleeding. You are not thinking clearly or you have trouble forming sentences. It may take a friend or family member to notice this. You have chest pain, abdominal pain, shortness of breath, or sweating. Your vision changes or you develop a severe headache. These symptoms may represent a serious problem that is an emergency. Do not wait to see if the symptoms will go away. Get medical help right away. Call your local emergency services (911 in the [place] Do not drive yourself to the hospital. Summary Dizziness is a feeling of unsteadiness or light-headedness. This condition can be caused by a number of   things, including medicines, dehydration, or illness. Anyone can become dizzy, but dizziness is more common in older adults. Drink enough fluid to keep your urine pale yellow. Do not drink alcohol. Avoid making quick movements if you feel dizzy. Monitor your dizziness for any changes. This information is not intended to replace advice given to you by your health care provider. Make sure you discuss any  questions you have with your health care provider. Document Revised: 03/15/2020 Document Reviewed: 03/15/2020 Elsevier Patient Education  2023 Elsevier Inc.  

## 2022-10-11 LAB — CMP14+EGFR
ALT: 21 IU/L (ref 0–32)
AST: 26 IU/L (ref 0–40)
Albumin: 4.4 g/dL (ref 3.8–4.8)
Alkaline Phosphatase: 55 IU/L (ref 44–121)
BUN/Creatinine Ratio: 21 (ref 12–28)
BUN: 15 mg/dL (ref 8–27)
Bilirubin Total: 0.3 mg/dL (ref 0.0–1.2)
CO2: 21 mmol/L (ref 20–29)
Calcium: 9.8 mg/dL (ref 8.7–10.3)
Chloride: 105 mmol/L (ref 96–106)
Creatinine, Ser: 0.72 mg/dL (ref 0.57–1.00)
Globulin, Total: 2.4 g/dL (ref 1.5–4.5)
Glucose: 91 mg/dL (ref 70–99)
Potassium: 4.1 mmol/L (ref 3.5–5.2)
Sodium: 141 mmol/L (ref 134–144)
Total Protein: 6.8 g/dL (ref 6.0–8.5)
eGFR: 88 mL/min/{1.73_m2} (ref >59)

## 2022-10-30 ENCOUNTER — Other Ambulatory Visit: Payer: Self-pay | Admitting: *Deleted

## 2022-10-30 DIAGNOSIS — M810 Age-related osteoporosis without current pathological fracture: Secondary | ICD-10-CM

## 2022-10-30 MED ORDER — ALENDRONATE SODIUM 70 MG PO TABS
ORAL_TABLET | ORAL | 0 refills | Status: AC
Start: 2022-10-30 — End: ?

## 2022-11-14 ENCOUNTER — Telehealth: Payer: Self-pay | Admitting: Cardiology

## 2022-11-14 ENCOUNTER — Telehealth: Payer: Self-pay | Admitting: Family

## 2022-11-14 NOTE — Telephone Encounter (Signed)
Yes it's just fine for her to take.

## 2022-11-14 NOTE — Telephone Encounter (Signed)
Doxycyline is safe to take with A Fib.   [name], FNP

## 2022-11-14 NOTE — Telephone Encounter (Signed)
Patient aware and verbalized understanding. °

## 2022-11-14 NOTE — Telephone Encounter (Signed)
Spoke with pt, aware okay to take meds

## 2022-11-14 NOTE — Telephone Encounter (Signed)
Pt has an eye infection and was prescribed Doxycycline by her eye doctor. Wants to know if Neysa Bonito thinks its ok for her to take this considering she has Afib.

## 2022-11-14 NOTE — Telephone Encounter (Signed)
Pt c/o medication issue:  1. Name of Medication: Doxycycline  2. How are you currently taking this medication (dosage and times per day)?   3. Are you having a reaction (difficulty breathing--STAT)?   4. What is your medication issue? Please ask Dr Antoine Poche patient said if this medicine is alright for her to take for an infection in her eye?

## 2022-11-21 ENCOUNTER — Other Ambulatory Visit: Payer: Self-pay | Admitting: Family

## 2022-11-21 DIAGNOSIS — E785 Hyperlipidemia, unspecified: Secondary | ICD-10-CM

## 2022-11-27 ENCOUNTER — Encounter: Payer: Self-pay | Admitting: Family

## 2022-11-27 ENCOUNTER — Ambulatory Visit: Payer: Managed Care, Other (non HMO) | Admitting: Family

## 2022-11-27 VITALS — BP 127/66 | HR 64 | Temp 97.1°F | Ht 65.0 in | Wt 158.8 lb

## 2022-11-27 DIAGNOSIS — M1990 Unspecified osteoarthritis, unspecified site: Secondary | ICD-10-CM | POA: Diagnosis not present

## 2022-11-27 DIAGNOSIS — I4819 Other persistent atrial fibrillation: Secondary | ICD-10-CM | POA: Diagnosis not present

## 2022-11-27 DIAGNOSIS — E785 Hyperlipidemia, unspecified: Secondary | ICD-10-CM

## 2022-11-27 DIAGNOSIS — I10 Essential (primary) hypertension: Secondary | ICD-10-CM | POA: Diagnosis not present

## 2022-11-27 DIAGNOSIS — M810 Age-related osteoporosis without current pathological fracture: Secondary | ICD-10-CM

## 2022-11-27 DIAGNOSIS — Z23 Encounter for immunization: Secondary | ICD-10-CM

## 2022-11-27 DIAGNOSIS — F411 Generalized anxiety disorder: Secondary | ICD-10-CM

## 2022-11-27 NOTE — Progress Notes (Signed)
Subjective:    Patient ID: Kathryn Arnold, female    DOB: 27-Mar-1946, 77 y.o.   MRN: [id number]  Chief Complaint  Patient presents with   Medical Management of Chronic Issues   Pt presents to the office today for chronic follow up . Pt is followed by Cardiologists for A Fib  annually. Had an ablation on 10/31/16 and 2016.  She had cardioversion in 08/21. States since changing her diet and her procedures her A fib has improved.  She takes Xarelto 20 mg daily.    She has osteoporosis and takes Fosamax weekly. Her last Dexa scan was 06/17/21.  Hypertension This is a chronic problem. The current episode started more than 1 year ago. The problem has been resolved since onset. The problem is controlled. Associated symptoms include anxiety and malaise/fatigue ("some times"). Pertinent negatives include no peripheral edema or shortness of breath. Risk factors for coronary artery disease include dyslipidemia. The current treatment provides moderate improvement.  Arthritis Presents for follow-up visit. She complains of pain and stiffness. Affected locations include the left knee and right knee. Her pain is at a severity of 0/10 (some times it flares up).  Hyperlipidemia This is a chronic problem. The current episode started more than 1 year ago. The problem is controlled. Recent lipid tests were reviewed and are normal. She has no history of obesity. Pertinent negatives include no shortness of breath. Current antihyperlipidemic treatment includes statins. The current treatment provides moderate improvement of lipids. Risk factors for coronary artery disease include dyslipidemia, hypertension, a sedentary lifestyle and post-menopausal.  Anxiety Presents for follow-up visit. Patient reports no excessive worry, nervous/anxious behavior or shortness of breath. Symptoms occur occasionally. The severity of symptoms is moderate.        Review of Systems  Constitutional:  Positive for malaise/fatigue  ("some times").  Respiratory:  Negative for shortness of breath.   Musculoskeletal:  Positive for arthritis and stiffness.  Psychiatric/Behavioral:  The patient is not nervous/anxious.   All other systems reviewed and are negative.      Objective:   Physical Exam Vitals reviewed.  Constitutional:      General: She is not in acute distress.    Appearance: She is well-developed.  HENT:     Head: Normocephalic and atraumatic.     Right Ear: Tympanic membrane normal.     Left Ear: Tympanic membrane normal.  Eyes:     Pupils: Pupils are equal, round, and reactive to light.  Neck:     Thyroid: No thyromegaly.  Cardiovascular:     Rate and Rhythm: Normal rate and regular rhythm.     Heart sounds: Normal heart sounds. No murmur heard. Pulmonary:     Effort: Pulmonary effort is normal. No respiratory distress.     Breath sounds: Normal breath sounds. No wheezing.  Abdominal:     General: Bowel sounds are normal. There is no distension.     Palpations: Abdomen is soft.     Tenderness: There is no abdominal tenderness.  Musculoskeletal:        General: No tenderness. Normal range of motion.     Cervical back: Normal range of motion and neck supple.  Skin:    General: Skin is warm and dry.  Neurological:     Mental Status: She is alert and oriented to person, place, and time.     Cranial Nerves: No cranial nerve deficit.     Deep Tendon Reflexes: Reflexes are normal and symmetric.  Psychiatric:  Behavior: Behavior normal.        Thought Content: Thought content normal.        Judgment: Judgment normal.       BP 127/66   Pulse 64   Temp (!) 97.1 F (36.2 C) (Temporal)   Ht 5\' 5"  (1.651 m)   Wt 158 lb 12.8 oz (72 kg)   SpO2 96%   BMI 26.43 kg/m      Assessment & Plan:  Kathryn Arnold comes in today with chief complaint of Medical Management of Chronic Issues   Diagnosis and orders addressed:  1. Persistent atrial fibrillation (HCC)  2. Arthritis  3.  Essential hypertension, benign  4. GAD (generalized anxiety disorder)  5. Hyperlipidemia with target LDL less than 100  6. Osteoporosis, unspecified osteoporosis type, unspecified pathological fracture presence   Labs reviewed  Health Maintenance reviewed Diet and exercise encouraged  Follow up plan: 6 months    [name], FNP

## 2022-11-27 NOTE — Patient Instructions (Signed)
Health Maintenance After Age 75 After age 75, you are at a higher risk for certain long-term diseases and infections as well as injuries from falls. Falls are a major cause of broken bones and head injuries in people who are older than age 75. Getting regular preventive care can help to keep you healthy and well. Preventive care includes getting regular testing and making lifestyle changes as recommended by your health care provider. Talk with your health care provider about: Which screenings and tests you should have. A screening is a test that checks for a disease when you have no symptoms. A diet and exercise plan that is right for you. What should I know about screenings and tests to prevent falls? Screening and testing are the best ways to find a health problem early. Early diagnosis and treatment give you the best chance of managing medical conditions that are common after age 75. Certain conditions and lifestyle choices may make you more likely to have a fall. Your health care provider may recommend: Regular vision checks. Poor vision and conditions such as cataracts can make you more likely to have a fall. If you wear glasses, make sure to get your prescription updated if your vision changes. Medicine review. Work with your health care provider to regularly review all of the medicines you are taking, including over-the-counter medicines. Ask your health care provider about any side effects that may make you more likely to have a fall. Tell your health care provider if any medicines that you take make you feel dizzy or sleepy. Strength and balance checks. Your health care provider may recommend certain tests to check your strength and balance while standing, walking, or changing positions. Foot health exam. Foot pain and numbness, as well as not wearing proper footwear, can make you more likely to have a fall. Screenings, including: Osteoporosis screening. Osteoporosis is a condition that causes  the bones to get weaker and break more easily. Blood pressure screening. Blood pressure changes and medicines to control blood pressure can make you feel dizzy. Depression screening. You may be more likely to have a fall if you have a fear of falling, feel depressed, or feel unable to do activities that you used to do. Alcohol use screening. Using too much alcohol can affect your balance and may make you more likely to have a fall. Follow these instructions at home: Lifestyle Do not drink alcohol if: Your health care provider tells you not to drink. If you drink alcohol: Limit how much you have to: 0-1 drink a day for women. 0-2 drinks a day for men. Know how much alcohol is in your drink. In the [place] one drink equals one 12 oz bottle of beer (355 mL), one 5 oz glass of wine (148 mL), or one 1 oz glass of hard liquor (44 mL). Do not use any products that contain nicotine or tobacco. These products include cigarettes, chewing tobacco, and vaping devices, such as e-cigarettes. If you need help quitting, ask your health care provider. Activity  Follow a regular exercise program to stay fit. This will help you maintain your balance. Ask your health care provider what types of exercise are appropriate for you. If you need a cane or walker, use it as recommended by your health care provider. Wear supportive shoes that have nonskid soles. Safety  Remove any tripping hazards, such as rugs, cords, and clutter. Install safety equipment such as grab bars in bathrooms and safety rails on stairs. Keep rooms and walkways   well-lit. General instructions Talk with your health care provider about your risks for falling. Tell your health care provider if: You fall. Be sure to tell your health care provider about all falls, even ones that seem minor. You feel dizzy, tiredness (fatigue), or off-balance. Take over-the-counter and prescription medicines only as told by your health care provider. These include  supplements. Eat a healthy diet and maintain a healthy weight. A healthy diet includes low-fat dairy products, low-fat (lean) meats, and fiber from whole grains, beans, and lots of fruits and vegetables. Stay current with your vaccines. Schedule regular health, dental, and eye exams. Summary Having a healthy lifestyle and getting preventive care can help to protect your health and wellness after age 75. Screening and testing are the best way to find a health problem early and help you avoid having a fall. Early diagnosis and treatment give you the best chance for managing medical conditions that are more common for people who are older than age 75. Falls are a major cause of broken bones and head injuries in people who are older than age 75. Take precautions to prevent a fall at home. Work with your health care provider to learn what changes you can make to improve your health and wellness and to prevent falls. This information is not intended to replace advice given to you by your health care provider. Make sure you discuss any questions you have with your health care provider. Document Revised: 08/30/2020 Document Reviewed: 08/30/2020 [employer] Patient Education  2024 [employer]  

## 2022-12-01 ENCOUNTER — Other Ambulatory Visit: Payer: Self-pay | Admitting: Family

## 2022-12-01 DIAGNOSIS — I10 Essential (primary) hypertension: Secondary | ICD-10-CM

## 2022-12-01 DIAGNOSIS — I480 Paroxysmal atrial fibrillation: Secondary | ICD-10-CM

## 2022-12-28 ENCOUNTER — Other Ambulatory Visit: Payer: Self-pay | Admitting: Family

## 2022-12-28 DIAGNOSIS — I480 Paroxysmal atrial fibrillation: Secondary | ICD-10-CM

## 2023-01-25 ENCOUNTER — Other Ambulatory Visit: Payer: Self-pay | Admitting: Family

## 2023-01-25 DIAGNOSIS — M810 Age-related osteoporosis without current pathological fracture: Secondary | ICD-10-CM

## 2023-01-30 ENCOUNTER — Ambulatory Visit (INDEPENDENT_AMBULATORY_CARE_PROVIDER_SITE_OTHER): Payer: Managed Care, Other (non HMO)

## 2023-01-30 DIAGNOSIS — Z23 Encounter for immunization: Secondary | ICD-10-CM | POA: Diagnosis not present

## 2023-02-05 ENCOUNTER — Other Ambulatory Visit: Payer: Self-pay

## 2023-02-05 ENCOUNTER — Emergency Department (HOSPITAL_COMMUNITY): Payer: 59

## 2023-02-05 ENCOUNTER — Emergency Department (HOSPITAL_COMMUNITY)
Admission: EM | Admit: 2023-02-05 | Discharge: 2023-02-05 | Disposition: A | Discharge status: Home / Self Care | Payer: 59 | Attending: Emergency Medicine | Admitting: Emergency Medicine

## 2023-02-05 DIAGNOSIS — R000 Tachycardia, unspecified: Secondary | ICD-10-CM | POA: Diagnosis present

## 2023-02-05 DIAGNOSIS — Z7901 Long term (current) use of anticoagulants: Secondary | ICD-10-CM | POA: Insufficient documentation

## 2023-02-05 DIAGNOSIS — I4892 Unspecified atrial flutter: Secondary | ICD-10-CM | POA: Diagnosis not present

## 2023-02-05 DIAGNOSIS — I10 Essential (primary) hypertension: Secondary | ICD-10-CM | POA: Diagnosis not present

## 2023-02-05 LAB — CBC WITH DIFFERENTIAL/PLATELET
Abs Immature Granulocytes: 0.01 10*3/uL (ref 0.00–0.07)
Basophils Absolute: 0 10*3/uL (ref 0.0–0.1)
Basophils Relative: 1 %
Eosinophils Absolute: 0 10*3/uL (ref 0.0–0.5)
Eosinophils Relative: 1 %
HCT: 45.2 % (ref 36.0–46.0)
Hemoglobin: 15.6 g/dL — ABNORMAL HIGH (ref 12.0–15.0)
Immature Granulocytes: 0 %
Lymphocytes Relative: 24 %
Lymphs Abs: 1.3 10*3/uL (ref 0.7–4.0)
MCH: 30.2 pg (ref 26.0–34.0)
MCHC: 34.5 g/dL (ref 30.0–36.0)
MCV: 87.4 fL (ref 80.0–100.0)
Monocytes Absolute: 0.5 10*3/uL (ref 0.1–1.0)
Monocytes Relative: 9 %
Neutro Abs: 3.8 10*3/uL (ref 1.7–7.7)
Neutrophils Relative %: 65 %
Platelets: 274 10*3/uL (ref 150–400)
RBC: 5.17 MIL/uL — ABNORMAL HIGH (ref 3.87–5.11)
RDW: 11.9 % (ref 11.5–15.5)
WBC: 5.7 10*3/uL (ref 4.0–10.5)
nRBC: 0 % (ref 0.0–0.2)

## 2023-02-05 LAB — COMPREHENSIVE METABOLIC PANEL
ALT: 26 U/L (ref 0–44)
AST: 30 U/L (ref 15–41)
Albumin: 4.2 g/dL (ref 3.5–5.0)
Alkaline Phosphatase: 44 U/L (ref 38–126)
Anion gap: 8 (ref 5–15)
BUN: 13 mg/dL (ref 8–23)
CO2: 23 mmol/L (ref 22–32)
Calcium: 8.8 mg/dL — ABNORMAL LOW (ref 8.9–10.3)
Chloride: 106 mmol/L (ref 98–111)
Creatinine, Ser: 0.71 mg/dL (ref 0.44–1.00)
GFR, Estimated: >60 mL/min (ref >60)
Glucose, Bld: 125 mg/dL — ABNORMAL HIGH (ref 70–99)
Potassium: 3.4 mmol/L — ABNORMAL LOW (ref 3.5–5.1)
Sodium: 137 mmol/L (ref 135–145)
Total Bilirubin: 0.7 mg/dL (ref 0.3–1.2)
Total Protein: 7.5 g/dL (ref 6.5–8.1)

## 2023-02-05 LAB — TSH: TSH: 2.451 u[IU]/mL (ref 0.350–4.500)

## 2023-02-05 LAB — TROPONIN I (HIGH SENSITIVITY)
Troponin I (High Sensitivity): 10 ng/L (ref <18)
Troponin I (High Sensitivity): 9 ng/L (ref <18)

## 2023-02-05 LAB — MAGNESIUM: Magnesium: 2.1 mg/dL (ref 1.7–2.4)

## 2023-02-05 LAB — T4, FREE: Free T4: 1 ng/dL (ref 0.61–1.12)

## 2023-02-05 MED ORDER — ETOMIDATE 2 MG/ML IV SOLN
10.0000 mg | Freq: Once | INTRAVENOUS | Status: AC
  Administered 2023-02-05: 10 mg via INTRAVENOUS
  Filled 2023-02-05: qty 10

## 2023-02-05 MED ORDER — SODIUM CHLORIDE 0.9 % IV BOLUS
1000.0000 mL | Freq: Once | INTRAVENOUS | Status: AC
  Administered 2023-02-05: 1000 mL via INTRAVENOUS

## 2023-02-05 NOTE — ED Notes (Signed)
Pt alert, NAD, calm, interactive, resps e/u, speaking in clear complete sentences. Denies pain, sob, nausea or dizziness.

## 2023-02-05 NOTE — Sedation Documentation (Signed)
EKG repeated

## 2023-02-05 NOTE — Discharge Instructions (Addendum)
Please follow up with cardiology/ atrial fibrillation clinic   It was a pleasure caring for you today in the emergency department.  Please return to the emergency department for any worsening or worrisome symptoms.

## 2023-02-05 NOTE — Sedation Documentation (Signed)
Sync'd cardioversion, 100j, HR ST 138 to NSR 75

## 2023-02-05 NOTE — ED Provider Notes (Signed)
[place] EMERGENCY DEPARTMENT AT [facility] Provider Note  CSN: [id number] Arrival date & time: 02/05/23 0750  Chief Complaint(s) Tachycardia  HPI Kathryn Arnold is a 77 y.o. female with past medical history as below, significant for arthritis, HLD, hypertension, PAF status post ablation 7/16 who presents to the ED with complaint of rapid HR  She has history of atrial fibrillation, status post ablation x 2 here with rapid heart rate.  Patient reports around 8 PM last night she noticed rapid heart rate.  Felt lightheaded, palpitations, tired.  She took her regular medications and as needed Cardizem without much relief of her symptoms.  She is compliant with her Xarelto with no missed doses.  No recent medication or diet changes.  She follows with cardiology locally.  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   HSV-2 (herpes simplex virus 2) infection    Hypercholesteremia    Hyperlipidemia with target LDL less than 100    Hypertension    Osteoporosis, unspecified    Last DEXA 10/2011    Paroxysmal atrial fibrillation (HCC)    s/p ablation Afib and flutter 11/03/14   PONV (postoperative nausea and vomiting)    Vitamin D deficiency    Patient Active Problem List   Diagnosis Date Noted   PONV (postoperative nausea and vomiting)    Complication of anesthesia    Arthritis    Paroxysmal atrial fibrillation (HCC) 10/31/2016   GAD (generalized anxiety disorder) 01/28/2014   Essential hypertension, benign 01/20/2014   Atrial fibrillation (HCC) 12/31/2013   HSV-2 (herpes simplex virus 2) infection 12/18/2013   Vitamin D deficiency 12/18/2013   Hyperlipidemia with target LDL less than 100 08/28/2012   Osteoporosis 08/28/2012    Home Medication(s) Prior to Admission medications   Medication Sig Start Date End Date Taking? Authorizing Provider  alendronate (FOSAMAX) 70 MG tablet TAKE 1 TABLET BY MOUTH ONCE A WEEK WITH A FULL GLASS OF WATER ON AN EMPTY STOMACH. DO NOT LIE DOWN FOR 30 MINUTES. REMAIN UPRIGHT 01/25/23   [name] A, FNP  Ascorbic Acid (VITAMIN C PO) Take one tablet by mouth daily    [provider]  Calcium Citrate-Vitamin D (CITRACAL + D PO) Take 500 mg by mouth 2 (two) times daily.    [provider]  cetirizine (ZYRTEC ALLERGY) 10 MG tablet Take 1 tablet (10 mg total) by mouth daily. 08/11/22   Junie Spencer, FNP  diltiazem (CARDIZEM) 60 MG tablet Take 0.5-1 tablets (30-60 mg total) by mouth every 6 (six) hours as needed (Heart rate over 100 as long as systolic blood pressure >100). 12/15/21   Junie Spencer, FNP  diltiazem (CARTIA XT) 180 MG 24 hr capsule Take 1 capsule by mouth once daily 12/01/22   [name] A, FNP  flecainide (TAMBOCOR) 50 MG tablet Take 1 tablet by mouth twice daily 12/28/22   [name] A, FNP  fluticasone (FLONASE) 50 MCG/ACT nasal spray Place 2 sprays into both nostrils daily. 08/11/22   [name] A, FNP  glucosamine-chondroitin 500-400 MG tablet Take 2 tablets by mouth daily.    [provider]  Magnesium 250 MG TABS Take 1 tablet by mouth every evening.  [provider]  meclizine (ANTIVERT) 25 MG tablet Take 1 tablet (25 mg total) by mouth 3 (three) times daily as needed for dizziness. 10/10/22   Junie Spencer, FNP  Multiple Vitamins-Minerals (CENTRUM SILVER PO) Take 1 tablet by mouth daily.    [provider]  rivaroxaban (XARELTO) 20 MG TABS tablet Take 1 tablet (20 mg total) by mouth at bedtime. 09/15/22   Junie Spencer, FNP  rosuvastatin (CRESTOR) 20 MG tablet TAKE 1 TABLET BY MOUTH AT BEDTIME 11/21/22   Hawks, Neysa Bonito A, FNP  valACYclovir (VALTREX) 500 MG tablet Take 1 tablet (500 mg total) by mouth  daily. Patient taking differently: Take 500 mg by mouth daily. Every other day 12/15/21   Junie Spencer, FNP                                                                                                                                    Past Surgical History Past Surgical History:  Procedure Laterality Date   ATRIAL FIBRILLATION ABLATION N/A 10/31/2016   Procedure: Atrial Fibrillation Ablation;  Surgeon: Hillis Range, MD;  Location: [facility] INVASIVE CV LAB;  Service: Cardiovascular;  Laterality: N/A;   BREAST LUMPECTOMY Left    DILATION AND CURETTAGE OF UTERUS     ELECTROPHYSIOLOGIC STUDY N/A 11/03/2014   Procedure: Atrial Fibrillation Ablation;  Surgeon: Hillis Range, MD;  Location: [facility] INVASIVE CV LAB;  Service: Cardiovascular;  Laterality: N/A;   implantable loop recorder placement  03/08/2018   [employer] Reveal LINQ implantation by Dr Johney Frame for afib management post ablation   LAPAROSCOPY ABDOMEN DIAGNOSTIC  1990s   LOOP RECORDER REMOVAL  09/17/2019   MDT Reveal LINQ removed in office by Dr Johney Frame   TEE WITHOUT CARDIOVERSION N/A 10/30/2016   Procedure: TRANSESOPHAGEAL ECHOCARDIOGRAM (TEE);  Surgeon: Thurmon Fair, MD;  Location: [facility] ENDOSCOPY;  Service: Cardiovascular;  Laterality: N/A;   TUBAL LIGATION  1978   Family History Family History  Problem Relation Age of Onset   Osteoporosis Mother    Cancer Mother        breast   Hypertension Mother    Heart disease Father    Other Neg Hx     Social History Social History   Tobacco Use   Smoking status: Never   Smokeless tobacco: Never  Vaping Use   Vaping status: Never Used  Substance Use Topics   Alcohol use: No   Drug use: No   Allergies Patient has no known allergies.  Review of Systems Review of Systems  Constitutional:  Positive for fatigue. Negative for chills.  Respiratory:  Negative for chest tightness and shortness of breath.   Cardiovascular:  Positive for palpitations. Negative for chest pain.   Gastrointestinal:  Negative for abdominal pain, nausea and vomiting.  Genitourinary:  Negative for dysuria.  Neurological:  Positive for light-headedness.  All other systems reviewed and are negative.   Physical Exam Vital Signs  I  have reviewed the triage vital signs BP (!) 146/77   Pulse 62   Temp 97.7 F (36.5 C) (Oral)   Resp 14   Ht 5\' 5"  (1.651 m)   Wt 67.6 kg   SpO2 98%   BMI 24.79 kg/m  Physical Exam Vitals and nursing note reviewed.  Constitutional:      General: She is not in acute distress.    Appearance: Normal appearance.  HENT:     Head: Normocephalic and atraumatic.     Right Ear: External ear normal.     Left Ear: External ear normal.     Nose: Nose normal.     Mouth/Throat:     Mouth: Mucous membranes are moist.  Eyes:     General: No scleral icterus.       Right eye: No discharge.        Left eye: No discharge.  Cardiovascular:     Rate and Rhythm: Tachycardia present.     Pulses: Normal pulses.     Heart sounds: Normal heart sounds.  Pulmonary:     Effort: Pulmonary effort is normal. No respiratory distress.     Breath sounds: Normal breath sounds. No stridor.  Abdominal:     General: Abdomen is flat. There is no distension.     Palpations: Abdomen is soft.     Tenderness: There is no abdominal tenderness.  Musculoskeletal:     Cervical back: No rigidity.     Right lower leg: No edema.     Left lower leg: No edema.  Skin:    General: Skin is warm and dry.     Capillary Refill: Capillary refill takes less than 2 seconds.  Neurological:     Mental Status: She is alert.  Psychiatric:        Mood and Affect: Mood normal.        Behavior: Behavior normal. Behavior is cooperative.     ED Results and Treatments Labs (all labs ordered are listed, but only abnormal results are displayed) Labs Reviewed  CBC WITH DIFFERENTIAL/PLATELET - Abnormal; Notable for the following components:      Result Value   RBC 5.17 (*)    Hemoglobin 15.6 (*)     All other components within normal limits  COMPREHENSIVE METABOLIC PANEL - Abnormal; Notable for the following components:   Potassium 3.4 (*)    Glucose, Bld 125 (*)    Calcium 8.8 (*)    All other components within normal limits  TSH  MAGNESIUM  T4, FREE  TROPONIN I (HIGH SENSITIVITY)  TROPONIN I (HIGH SENSITIVITY)                                                                                                                          Radiology DG Chest Port 1 View  Result Date: 02/05/2023 CLINICAL DATA:  77 year old female with tachycardia, palpitations. Atrial fibrillation. EXAM: PORTABLE CHEST 1 VIEW COMPARISON:  Chest radiographs 08/31/2021 and earlier. FINDINGS: Portable AP  upright view at 0811 hours. Cardiac size is at the upper limits of normal. Other mediastinal contours are within normal limits. Visualized tracheal air column is within normal limits. Lung volumes are stable, upper limits of normal. Allowing for portable technique the lungs are clear. No pneumothorax or pleural effusion. No acute osseous abnormality identified. IMPRESSION: Negative portable chest. Electronically Signed   By: Odessa Fleming M.D.   On: 02/05/2023 08:47    Pertinent labs & imaging results that were available during my care of the patient were reviewed by me and considered in my medical decision making (see MDM for details).  Medications Ordered in ED Medications  sodium chloride 0.9 % bolus 1,000 mL (0 mLs Intravenous Stopped 02/05/23 0930)  etomidate (AMIDATE) injection 10 mg (10 mg Intravenous Given 02/05/23 1014)                                                                                                                                     Procedures .Cardioversion  Date/Time: 02/05/2023 10:22 AM  Performed by: Sloan Leiter, DO Authorized by: Sloan Leiter, DO   Consent:    Consent obtained:  Verbal and written   Consent given by:  Patient   Risks discussed:  Cutaneous burn, death and  induced arrhythmia   Alternatives discussed:  No treatment and rate-control medication Pre-procedure details:    Cardioversion basis:  Emergent   Rhythm:  Atrial flutter Patient sedated: Yes. Refer to sedation procedure documentation for details of sedation.  Attempt one:    Cardioversion mode:  Synchronous   Shock (Joules):  100   Shock outcome:  Conversion to normal sinus rhythm Post-procedure details:    Patient status:  Awake   Patient tolerance of procedure:  Tolerated well, no immediate complications .Critical Care  Performed by: Sloan Leiter, DO Authorized by: Sloan Leiter, DO   Critical care provider statement:    Critical care time (minutes):  30   Critical care time was exclusive of:  Separately billable procedures and treating other patients   Critical care was necessary to treat or prevent imminent or life-threatening deterioration of the following conditions:  Cardiac failure   Critical care was time spent personally by me on the following activities:  Development of treatment plan with patient or surrogate, discussions with consultants, evaluation of patient's response to treatment, examination of patient, ordering and review of laboratory studies, ordering and review of radiographic studies, ordering and performing treatments and interventions, pulse oximetry, re-evaluation of patient's condition, review of old charts and obtaining history from patient or surrogate .Sedation  Date/Time: 02/05/2023 11:37 AM  Performed by: Sloan Leiter, DO Authorized by: Sloan Leiter, DO   Consent:    Consent obtained:  Verbal and written   Consent given by:  Patient   Risks discussed:  Allergic reaction, dysrhythmia, inadequate sedation and nausea   Alternatives discussed:  Analgesia without sedation Universal  protocol:    Immediately prior to procedure, a time out was called: yes     Patient identity confirmed:  Arm band and verbally with patient Indications:    Procedure  performed:  Cardioversion   Procedure necessitating sedation performed by:  Physician performing sedation Pre-sedation assessment:    Time since last food or drink:  3 hours   ASA classification: class 2 - patient with mild systemic disease     Mallampati score:  I - soft palate, uvula, fauces, pillars visible   Neck mobility: normal     Pre-sedation assessments completed and reviewed: airway patency, cardiovascular function, hydration status, mental status, nausea/vomiting, pain level, respiratory function and temperature     Pre-sedation assessment completed:  02/05/2023 10:09 AM Immediate pre-procedure details:    Reassessment: Patient reassessed immediately prior to procedure     Reviewed: vital signs, relevant labs/tests and NPO status     Verified: bag valve mask available, emergency equipment available, intubation equipment available, IV patency confirmed, oxygen available, reversal medications available and suction available   Procedure details (see MAR for exact dosages):    Preoxygenation:  Nasal cannula   Sedation:  Etomidate   Intended level of sedation: deep   Intra-procedure monitoring:  Blood pressure monitoring, cardiac monitor, continuous pulse oximetry and continuous capnometry   Intra-procedure events: none     Total Provider sedation time (minutes):  24 Post-procedure details:    Post-sedation assessment completed:  02/05/2023 10:35 AM   Attendance: Constant attendance by certified staff until patient recovered     Recovery: Patient returned to pre-procedure baseline     Post-sedation assessments completed and reviewed: airway patency, cardiovascular function, hydration status, mental status, nausea/vomiting, pain level and temperature     Patient is stable for discharge or admission: yes     Procedure completion:  Tolerated well, no immediate complications   (including critical care time)  Medical Decision Making / ED Course    Medical Decision Making:     JOANETTE SILVERIA is a 77 y.o. female with past medical history as below, significant for arthritis, HLD, hypertension, PAF status post ablation 7/16 who presents to the ED with complaint of rapid HR. The complaint involves an extensive differential diagnosis and also carries with it a high risk of complications and morbidity.  Serious etiology was considered. Ddx includes but is not limited to: Atrial fibrillation, atrial flutter, tachyarrhythmia, electrolyte derangement, thyroid derangement, etc.  Complete initial physical exam performed, notably the patient  was blood pressure stable, appears been atrial flutter on telemetry.    Reviewed and confirmed nursing documentation for past medical history, family history, social history.  Vital signs reviewed.    Clinical Course as of 02/05/23 1140  Mon Feb 05, 2023  1125 Continues to do well s/p cardioversion.  [SG]    Clinical Course User Index [SG] Sloan Leiter, DO     Patient here with rapid heart rate.  History of atrial fibrillation and atrial flutter, compliant with Xarelto.  On telemetry appears to be narrow complex tachycardia c/w atrial flutter 2-1 block.  Treatment options d/w the patient. Favor cardioversion. Given compliance with Xarelto and onset of symptoms around 12 hours ago will like to proceed with cardioversion.  All questions answered at bedside with patient and spouse.  Consent obtained.   Cardioversion complete, patient converted to NSR.  Feeling much better.  Advised to continue her home medications including Xarelto, follow-up cardiology and with PCP  Post sedation instructions provided  Stable  for discharge at this point   The patient improved significantly and was discharged in stable condition. Detailed discussions were had with the patient regarding current findings, and need for close f/u with PCP or on call doctor. The patient has been instructed to return immediately if the symptoms worsen in any way for  re-evaluation. Patient verbalized understanding and is in agreement with current care plan. All questions answered prior to discharge.             Additional history obtained: -Additional history obtained from spouse -External records from outside source obtained and reviewed including: Chart review including previous notes, labs, imaging, consultation notes including  Home medications Prior ekgs Prior primary care notes    Lab Tests: -I ordered, reviewed, and interpreted labs.   The pertinent results include:   Labs Reviewed  CBC WITH DIFFERENTIAL/PLATELET - Abnormal; Notable for the following components:      Result Value   RBC 5.17 (*)    Hemoglobin 15.6 (*)    All other components within normal limits  COMPREHENSIVE METABOLIC PANEL - Abnormal; Notable for the following components:   Potassium 3.4 (*)    Glucose, Bld 125 (*)    Calcium 8.8 (*)    All other components within normal limits  TSH  MAGNESIUM  T4, FREE  TROPONIN I (HIGH SENSITIVITY)  TROPONIN I (HIGH SENSITIVITY)    Notable for labs stable  EKG   EKG Interpretation Date/Time:  Monday February 05 2023 08:05:01 EDT Ventricular Rate:  140 PR Interval:  78 QRS Duration:  142 QT Interval:  324 QTC Calculation: 495 R Axis:   -9  Text Interpretation: Sinus tachycardia Atrial premature complex Right bundle branch block ST depression, consider ischemia, diffuse lds similar to prior Confirmed by Tanda Rockers ([id number]) on 02/05/2023 8:17:11 AM         Imaging Studies ordered: I ordered imaging studies including CXR I independently visualized the following imaging with scope of interpretation limited to determining acute life threatening conditions related to emergency care; findings noted above I independently visualized and interpreted imaging. I agree with the radiologist interpretation   Medicines ordered and prescription drug management: Meds ordered this encounter  Medications   sodium  chloride 0.9 % bolus 1,000 mL   etomidate (AMIDATE) injection 10 mg    -I have reviewed the patients home medicines and have made adjustments as needed   Consultations Obtained: na   Cardiac Monitoring: The patient was maintained on a cardiac monitor.  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    Final Clinical Impression(s) / ED Diagnoses Final diagnoses:  Atrial flutter with rapid ventricular response (HCC)        Sloan Leiter, DO 02/05/23 1141

## 2023-02-05 NOTE — ED Notes (Signed)
Placed on ETCO2, and O2 [place] 2L.

## 2023-02-05 NOTE — ED Triage Notes (Signed)
Pt came in POV c/o rapid heart rate since last night. Pt is on xeralto . Pt stated rate was 154.

## 2023-02-20 ENCOUNTER — Other Ambulatory Visit: Payer: Self-pay | Admitting: Family

## 2023-02-20 DIAGNOSIS — E785 Hyperlipidemia, unspecified: Secondary | ICD-10-CM

## 2023-03-07 ENCOUNTER — Other Ambulatory Visit: Payer: Self-pay | Admitting: Family

## 2023-03-07 DIAGNOSIS — I480 Paroxysmal atrial fibrillation: Secondary | ICD-10-CM

## 2023-03-26 ENCOUNTER — Ambulatory Visit: Payer: Managed Care, Other (non HMO) | Admitting: Nurse Practitioner

## 2023-03-26 ENCOUNTER — Other Ambulatory Visit (INDEPENDENT_AMBULATORY_CARE_PROVIDER_SITE_OTHER): Payer: Managed Care, Other (non HMO)

## 2023-03-26 ENCOUNTER — Telehealth: Payer: Self-pay | Admitting: Family Medicine

## 2023-03-26 ENCOUNTER — Encounter: Payer: Self-pay | Admitting: Nurse Practitioner

## 2023-03-26 VITALS — BP 121/59 | HR 56 | Temp 97.7°F | Resp 20 | Ht 65.0 in | Wt 159.0 lb

## 2023-03-26 DIAGNOSIS — M542 Cervicalgia: Secondary | ICD-10-CM

## 2023-03-26 DIAGNOSIS — R002 Palpitations: Secondary | ICD-10-CM

## 2023-03-26 MED ORDER — CELECOXIB 200 MG PO CAPS: 200.0000 mg | ORAL_CAPSULE | Freq: Two times a day (BID) | ORAL | 1 refills | Status: DC

## 2023-03-26 NOTE — Addendum Note (Signed)
Addended by: Cleda Daub on: 03/26/2023 08:58 AM   Modules accepted: Orders

## 2023-03-26 NOTE — Telephone Encounter (Signed)
Copied from CRM [id number]. Topic: Clinical - Medication Question >> Mar 26, 2023  2:40 PM Kathryn Arnold wrote: Reason for CRM: pt forgot to mention to provider that she takes Xarelto and was prescribed celecoxib (CELEBREX) 200 MG capsule by provider. Pt would like to confirm it'Arnold safe to take both

## 2023-03-26 NOTE — Progress Notes (Signed)
Subjective:    Patient ID: Kathryn Arnold, female    DOB: 01/10/1946, 77 y.o.   MRN: [id number]   Chief Complaint: Knot in back of neck, Shoulder Pain (Right shoulder/), and Palpitations   Shoulder Pain   Palpitations  Pertinent negatives include no chest pain, diaphoresis, dizziness, shortness of breath or weakness.    Patient in today with several complaints: - knot in neck- has been there for months but has not been bother ing her. But recently went she cooks a lot and that causes area to start hurting. Rates pain 8/10 when hurting. Rest helps. - right shoulder pain- lots of movement increases pain . Radiates own right arm. Rates pain 8/10 when hurting. Rest -palpitations- started about 2 weeks ago. Daily. Intermittent. Doe snot feel like heart  is racing. She has a history of Afib. She avoids caffeine.   Patient Active Problem List   Diagnosis Date Noted   PONV (postoperative nausea and vomiting)    Complication of anesthesia    Arthritis    Paroxysmal atrial fibrillation (HCC) 10/31/2016   GAD (generalized anxiety disorder) 01/28/2014   Essential hypertension, benign 01/20/2014   Atrial fibrillation (HCC) 12/31/2013   HSV-2 (herpes simplex virus 2) infection 12/18/2013   Vitamin D deficiency 12/18/2013   Hyperlipidemia with target LDL less than 100 08/28/2012   Osteoporosis 08/28/2012       Review of Systems  Constitutional:  Negative for diaphoresis.  Eyes:  Negative for pain.  Respiratory:  Negative for shortness of breath.   Cardiovascular:  Positive for palpitations. Negative for chest pain and leg swelling.  Gastrointestinal:  Negative for abdominal pain.  Endocrine: Negative for polydipsia.  Skin:  Negative for rash.  Neurological:  Negative for dizziness, weakness and headaches.  Hematological:  Does not bruise/bleed easily.  All other systems reviewed and are negative.      Objective:   Physical Exam Vitals reviewed.  Constitutional:       Appearance: Normal appearance.  Cardiovascular:     Rate and Rhythm: Normal rate and regular rhythm.     Heart sounds: Normal heart sounds.  Pulmonary:     Effort: Pulmonary effort is normal.     Breath sounds: Normal breath sounds.  Musculoskeletal:     Right lower leg: No edema.     Left lower leg: No edema.     Comments: Tender nodule right posterior neck. FROM pof cervical spine with pain on full extension. FROM of right without pain Motor strength and sensation intact distally Grips equal.  Skin:    General: Skin is warm.  Neurological:     General: No focal deficit present.     Mental Status: She is alert and oriented to person, place, and time.  Psychiatric:        Mood and Affect: Mood normal.        Behavior: Behavior normal.     BP (!) 121/59   Pulse (!) 56   Temp 97.7 F (36.5 C) (Temporal)   Resp 20   Ht 5\' 5"  (1.651 m)   Wt 159 lb (72.1 kg)   SpO2 99%   BMI 26.46 kg/m        Assessment & Plan:   [name] in today with chief complaint of Knot in back of neck, Shoulder Pain (Right shoulder/), and Palpitations   1. Palpitations Avoid caffeine Zio monitor - EKG 12-Lead  2. Neck pain Moist heat Rest TENS unit   Meds ordered this  encounter  Medications   celecoxib (CELEBREX) 200 MG capsule    Sig: Take 1 capsule (200 mg total) by mouth 2 (two) times daily.    Dispense:  60 capsule    Refill:  1    Order Specific Question:   Supervising Provider    Answer:   Arville Care A [[id number]]      The above assessment and management plan was discussed with the patient. The patient verbalized understanding of and has agreed to the management plan. Patient is aware to call the clinic if symptoms persist or worsen. Patient is aware when to return to the clinic for a follow-up visit. Patient educated on when it is appropriate to go to the emergency department.   Kathryn Daphine Deutscher, FNP

## 2023-03-26 NOTE — Telephone Encounter (Signed)
Only other thing you can do is take tylenol

## 2023-03-26 NOTE — Addendum Note (Signed)
Addended by: Tamera Punt on: 03/26/2023 10:09 AM   Modules accepted: Orders

## 2023-03-27 NOTE — Telephone Encounter (Signed)
Patient notified and verbalized understanding. 

## 2023-04-13 ENCOUNTER — Ambulatory Visit: Payer: 59 | Admitting: Cardiology

## 2023-04-24 ENCOUNTER — Other Ambulatory Visit: Payer: Self-pay | Admitting: *Deleted

## 2023-04-24 DIAGNOSIS — M810 Age-related osteoporosis without current pathological fracture: Secondary | ICD-10-CM

## 2023-04-24 MED ORDER — ALENDRONATE SODIUM 70 MG PO TABS: ORAL_TABLET | ORAL | 0 refills | Status: DC

## 2023-04-24 NOTE — Telephone Encounter (Signed)
Christy NTBS for 6 mos FU in Feb. RF sent to pharmacy

## 2023-04-27 ENCOUNTER — Ambulatory Visit (INDEPENDENT_AMBULATORY_CARE_PROVIDER_SITE_OTHER): Payer: Managed Care, Other (non HMO)

## 2023-04-27 ENCOUNTER — Encounter: Payer: Self-pay | Admitting: Family Medicine

## 2023-04-27 ENCOUNTER — Ambulatory Visit: Payer: Managed Care, Other (non HMO) | Admitting: Family Medicine

## 2023-04-27 VITALS — BP 122/61 | HR 71 | Temp 97.0°F | Ht 65.0 in | Wt 161.6 lb

## 2023-04-27 DIAGNOSIS — J069 Acute upper respiratory infection, unspecified: Secondary | ICD-10-CM

## 2023-04-27 MED ORDER — GUAIFENESIN ER 600 MG PO TB12: 600.0000 mg | ORAL_TABLET | Freq: Two times a day (BID) | ORAL | 0 refills | Status: AC

## 2023-04-27 MED ORDER — PREDNISONE 20 MG PO TABS: 40.0000 mg | ORAL_TABLET | Freq: Every day | ORAL | 0 refills | Status: AC

## 2023-04-27 NOTE — Progress Notes (Signed)
 Subjective:  Patient ID: Kathryn Arnold, female    DOB: December 13, 1945, 78 y.o.   MRN: [id number]  Patient Care Team: Lavell Bari LABOR, FNP as PCP - General (Nurse Practitioner)   Chief Complaint:  Follow-up (Urgent care follow up- 04/25/23- RSV. Patient states that she is no better.  Worsening SOB)   HPI: Kathryn Arnold is a 78 y.o. female presenting on 04/27/2023 for Follow-up (Urgent care follow up- 04/25/23- RSV. Patient states that she is no better.  Worsening SOB)   Discussed the use of AI scribe software for clinical note transcription with the patient, who gave verbal consent to proceed.  History of Present Illness   The patient, diagnosed with RSV at an urgent care visit on the first of the month, had been feeling unwell for a couple of days prior to that visit. They report a significant production of mucus and sputum, which they believe is originating from their lungs. This has led to the development of sinus problems, with a lot of discharge from their head. They have been taking Coricidin HBP since their urgent care visit.  The patient also reports they had a low-grade fever for a few days and discomfort when coughing.They express a strong desire for symptom relief and recovery, as they are uncomfortable with being unwell and inactive.        Relevant past medical, surgical, family, and social history reviewed and updated as indicated.  Allergies and medications reviewed and updated. Data reviewed: Chart in Epic.   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   HSV-2 (herpes simplex virus 2) infection    Hypercholesteremia    Hyperlipidemia with target LDL less than 100    Hypertension    Osteoporosis, unspecified    Last DEXA 10/2011    Paroxysmal atrial fibrillation (HCC)    s/p ablation Afib and  flutter 11/03/14   PONV (postoperative nausea and vomiting)    Vitamin D  deficiency     Past Surgical History:  Procedure Laterality Date   ATRIAL FIBRILLATION ABLATION N/A 10/31/2016   Procedure: Atrial Fibrillation Ablation;  Surgeon: Kelsie Agent, MD;  Location: [facility] INVASIVE CV LAB;  Service: Cardiovascular;  Laterality: N/A;   BREAST LUMPECTOMY Left    DILATION AND CURETTAGE OF UTERUS     ELECTROPHYSIOLOGIC STUDY N/A 11/03/2014   Procedure: Atrial Fibrillation Ablation;  Surgeon: Agent Kelsie, MD;  Location: [facility] INVASIVE CV LAB;  Service: Cardiovascular;  Laterality: N/A;   implantable loop recorder placement  03/08/2018   [employer] Reveal LINQ implantation by Dr Kelsie for afib management post ablation   LAPAROSCOPY ABDOMEN DIAGNOSTIC  1990s   LOOP RECORDER REMOVAL  09/17/2019   MDT Reveal LINQ removed in office by Dr Kelsie   TEE WITHOUT CARDIOVERSION N/A 10/30/2016   Procedure: TRANSESOPHAGEAL ECHOCARDIOGRAM (TEE);  Surgeon: Francyne Headland, MD;  Location: [facility] ENDOSCOPY;  Service: Cardiovascular;  Laterality: N/A;   TUBAL LIGATION  1978    Social History   Socioeconomic History   Marital status: Married    Spouse name: Not on file   Number of children: Not on file   Years of education: Not on file   Highest education level: Not on file  Occupational History   Not on file  Tobacco Use   Smoking status:  Never   Smokeless tobacco: Never  Vaping Use   Vaping status: Never Used  Substance and Sexual Activity   Alcohol use: No   Drug use: No   Sexual activity: Not Currently    Birth control/protection: None  Other Topics Concern   Not on file  Social History Narrative   Lives in [place] with spouse.   Social Drivers of Corporate Investment Banker Strain: Not on file  Food Insecurity: Not on file  Transportation Needs: Not on file  Physical Activity: Not on file  Stress: Not on file  Social Connections: Not on file  Intimate Partner Violence: Not At Risk (03/13/2018)    Humiliation, Afraid, Rape, and Kick questionnaire    Fear of Current or Ex-Partner: No    Emotionally Abused: No    Physically Abused: No    Sexually Abused: No    Outpatient Encounter Medications as of 04/27/2023  Medication Sig   alendronate  (FOSAMAX ) 70 MG tablet TAKE 1 TABLET BY MOUTH ONCE A WEEK WITH A FULL GLASS OF WATER ON AN EMPTY STOMACH. DO NOT LIE DOWN FOR 30 MINUTES. REMAIN UPRIGHT   Ascorbic Acid (VITAMIN C PO) Take one tablet by mouth daily   Calcium  Citrate-Vitamin D  (CITRACAL + D PO) Take 500 mg by mouth 2 (two) times daily.   celecoxib  (CELEBREX ) 200 MG capsule Take 1 capsule (200 mg total) by mouth 2 (two) times daily.   cetirizine  (ZYRTEC  ALLERGY) 10 MG tablet Take 1 tablet (10 mg total) by mouth daily.   diltiazem  (CARDIZEM ) 60 MG tablet Take 0.5-1 tablets (30-60 mg total) by mouth every 6 (six) hours as needed (Heart rate over 100 as long as systolic blood pressure >100).   diltiazem  (CARTIA  XT) 180 MG 24 hr capsule Take 1 capsule by mouth once daily   flecainide  (TAMBOCOR ) 50 MG tablet Take 1 tablet by mouth twice daily   fluticasone  (FLONASE ) 50 MCG/ACT nasal spray Place 2 sprays into both nostrils daily.   glucosamine-chondroitin 500-400 MG tablet Take 2 tablets by mouth daily.   guaiFENesin  (MUCINEX ) 600 MG 12 hr tablet Take 1 tablet (600 mg total) by mouth 2 (two) times daily for 10 days.   Magnesium 250 MG TABS Take 1 tablet by mouth every evening.   meclizine  (ANTIVERT ) 25 MG tablet Take 1 tablet (25 mg total) by mouth 3 (three) times daily as needed for dizziness.   Multiple Vitamins-Minerals (CENTRUM SILVER PO) Take 1 tablet by mouth daily.   predniSONE  (DELTASONE ) 20 MG tablet Take 2 tablets (40 mg total) by mouth daily with breakfast for 5 days.   rosuvastatin  (CRESTOR ) 20 MG tablet TAKE 1 TABLET BY MOUTH AT BEDTIME   valACYclovir  (VALTREX ) 500 MG tablet Take 1 tablet (500 mg total) by mouth daily. (Patient taking differently: Take 500 mg by mouth daily.  Every other day)   XARELTO  20 MG TABS tablet TAKE 1 TABLET BY MOUTH AT BEDTIME   No facility-administered encounter medications on file as of 04/27/2023.    No Known Allergies  Pertinent ROS per HPI, otherwise unremarkable      Objective:  BP 122/61   Pulse 71   Temp (!) 97 F (36.1 C)   Ht 5' 5 (1.651 m)   Wt 161 lb 9.6 oz (73.3 kg)   SpO2 95%   BMI 26.89 kg/m    Wt Readings from Last 3 Encounters:  04/27/23 161 lb 9.6 oz (73.3 kg)  03/26/23 159 lb (72.1 kg)  02/05/23 149 lb (  67.6 kg)    Physical Exam Vitals and nursing note reviewed.  Constitutional:      General: She is not in acute distress.    Appearance: Normal appearance. She is not ill-appearing, toxic-appearing or diaphoretic.  HENT:     Head: Normocephalic and atraumatic.     Right Ear: Tympanic membrane, ear canal and external ear normal.     Left Ear: Tympanic membrane, ear canal and external ear normal.     Nose: Nose normal.     Mouth/Throat:     Mouth: Mucous membranes are moist.     Pharynx: Oropharynx is clear.  Eyes:     Conjunctiva/sclera: Conjunctivae normal.     Pupils: Pupils are equal, round, and reactive to light.  Cardiovascular:     Rate and Rhythm: Normal rate and regular rhythm.     Heart sounds: Normal heart sounds.  Pulmonary:     Effort: Pulmonary effort is normal.     Breath sounds: Normal breath sounds. No wheezing, rhonchi or rales.  Musculoskeletal:     Cervical back: Neck supple.     Right lower leg: No edema.     Left lower leg: No edema.  Skin:    General: Skin is warm and dry.     Capillary Refill: Capillary refill takes less than 2 seconds.  Neurological:     General: No focal deficit present.     Mental Status: She is alert and oriented to person, place, and time.  Psychiatric:        Mood and Affect: Mood normal.        Behavior: Behavior normal.        Thought Content: Thought content normal.        Judgment: Judgment normal.      Results for orders placed  or performed during the hospital encounter of 02/05/23  CBC with Differential   Collection Time: 02/05/23  8:24 AM  Result Value Ref Range   WBC 5.7 4.0 - 10.5 K/uL   RBC 5.17 (H) 3.87 - 5.11 MIL/uL   Hemoglobin 15.6 (H) 12.0 - 15.0 g/dL   HCT 54.7 63.9 - 53.9 %   MCV 87.4 80.0 - 100.0 fL   MCH 30.2 26.0 - 34.0 pg   MCHC 34.5 30.0 - 36.0 g/dL   RDW 88.0 88.4 - 84.4 %   Platelets 274 150 - 400 K/uL   nRBC 0.0 0.0 - 0.2 %   Neutrophils Relative % 65 %   Neutro Abs 3.8 1.7 - 7.7 K/uL   Lymphocytes Relative 24 %   Lymphs Abs 1.3 0.7 - 4.0 K/uL   Monocytes Relative 9 %   Monocytes Absolute 0.5 0.1 - 1.0 K/uL   Eosinophils Relative 1 %   Eosinophils Absolute 0.0 0.0 - 0.5 K/uL   Basophils Relative 1 %   Basophils Absolute 0.0 0.0 - 0.1 K/uL   Immature Granulocytes 0 %   Abs Immature Granulocytes 0.01 0.00 - 0.07 K/uL  Comprehensive metabolic panel   Collection Time: 02/05/23  8:24 AM  Result Value Ref Range   Sodium 137 135 - 145 mmol/L   Potassium 3.4 (L) 3.5 - 5.1 mmol/L   Chloride 106 98 - 111 mmol/L   CO2 23 22 - 32 mmol/L   Glucose, Bld 125 (H) 70 - 99 mg/dL   BUN 13 8 - 23 mg/dL   Creatinine, Ser 9.28 0.44 - 1.00 mg/dL   Calcium  8.8 (L) 8.9 - 10.3 mg/dL   Total Protein  7.5 6.5 - 8.1 g/dL   Albumin 4.2 3.5 - 5.0 g/dL   AST 30 15 - 41 U/L   ALT 26 0 - 44 U/L   Alkaline Phosphatase 44 38 - 126 U/L   Total Bilirubin 0.7 0.3 - 1.2 mg/dL   GFR, Estimated >39 >39 mL/min   Anion gap 8 5 - 15  TSH   Collection Time: 02/05/23  8:24 AM  Result Value Ref Range   TSH 2.451 0.350 - 4.500 uIU/mL  T4, free   Collection Time: 02/05/23  8:24 AM  Result Value Ref Range   Free T4 1.00 0.61 - 1.12 ng/dL  Magnesium   Collection Time: 02/05/23  8:24 AM  Result Value Ref Range   Magnesium 2.1 1.7 - 2.4 mg/dL  Troponin I (High Sensitivity)   Collection Time: 02/05/23  8:24 AM  Result Value Ref Range   Troponin I (High Sensitivity) 10 <18 ng/L  Troponin I (High Sensitivity)    Collection Time: 02/05/23  9:59 AM  Result Value Ref Range   Troponin I (High Sensitivity) 9 <18 ng/L       Pertinent labs & imaging results that were available during my care of the patient were reviewed by me and considered in my medical decision making.  Assessment & Plan:  Elberta was seen today for follow-up.  Diagnoses and all orders for this visit:  URI with cough and congestion -     DG Chest 2 View; Future -     guaiFENesin  (MUCINEX ) 600 MG 12 hr tablet; Take 1 tablet (600 mg total) by mouth 2 (two) times daily for 10 days. -     predniSONE  (DELTASONE ) 20 MG tablet; Take 2 tablets (40 mg total) by mouth daily with breakfast for 5 days.     Assessment and Plan    Respiratory Syncytial Virus (RSV) Infection RSV infection diagnosed on April 25, 2023, with symptoms of significant mucus production, sinus congestion, and chest discomfort. No pneumonia on chest x-ray, no significant wheezing or fever. Discussed that RSV is viral and antibiotics are ineffective. Emphasized hydration and humidifier use. Recovery can take 2-3 weeks; monitor for worsening symptoms or fever. - Prescribe guaifenesin  (Mucinex ) twice daily with increased water intake - Prescribe prednisone , two pills once daily in the morning - Advise use of a humidifier - Encourage increased fluid intake - Instruct to monitor for worsening symptoms or fever and report if these occur.          Continue all other maintenance medications.  Follow up plan: Return if symptoms worsen or fail to improve.   Continue healthy lifestyle choices, including diet (rich in fruits, vegetables, and lean proteins, and low in salt and simple carbohydrates) and exercise (at least 30 minutes of moderate physical activity daily).  Educational handout given for RSV  The above assessment and management plan was discussed with the patient. The patient verbalized understanding of and has agreed to the management plan. Patient is aware  to call the clinic if they develop any new symptoms or if symptoms persist or worsen. Patient is aware when to return to the clinic for a follow-up visit. Patient educated on when it is appropriate to go to the emergency department.   Rosaline Bruns, FNP-C [facility] [phone]

## 2023-04-29 NOTE — Progress Notes (Deleted)
  Cardiology Office Note:   Date:  04/29/2023  ID:  Kathryn Arnold, DOB 1946/03/02, MRN [id number] PCP: Lavell Bari LABOR, FNP  [facility] Providers Cardiologist:  None {  History of Present Illness:   Kathryn Arnold is a 78 y.o. female who presents for follow up of atrial fib. She has had two ablations and has been treated with flecainide .  Since I last saw her she was in the ED on October with 2:1 flutter.  I reviewed these records for this visit.   ***     ***  She was seen in the At[facility]. She had a Linq removed in May 2021. At the last visit she was having increased palpitations. She wore a two week monitor. This demonstrated no atrial fib but some short runs of bradycardia not associated with symptoms. She also had decreased exercise tolerance and was to have a PET. However, she started feeling better. She had her allergies treated. She was having some hypoglycemia and started drinking small meals and recognizing that if she ate when she felt like that her symptoms got better. She has been walking the track and she is not having any of the exercise intolerance that she was having. She feels much better. She is not having any chest pain, neck or arm discomfort. She had no symptomatic palpitations and no presyncope or syncope.   ROS: ***  Studies Reviewed:    EKG:       ***  Risk Assessment/Calculations:   {Does this patient have ATRIAL FIBRILLATION?[phone]} No BP recorded.  {Refresh Note OR Click here to enter BP  :1}***        Physical Exam:   VS:  There were no vitals taken for this visit.   Wt Readings from Last 3 Encounters:  04/27/23 161 lb 9.6 oz (73.3 kg)  03/26/23 159 lb (72.1 kg)  02/05/23 149 lb (67.6 kg)     GEN: Well nourished, well developed in no acute distress NECK: No JVD; No carotid bruits CARDIAC: ***RR, *** murmurs, rubs, gallops RESPIRATORY:  Clear to auscultation without rales, wheezing or rhonchi  ABDOMEN: Soft, non-tender,  non-distended EXTREMITIES:  No edema; No deformity   ASSESSMENT AND PLAN:   PAF:  [name] has a CHA2DS2 - VASc score of 3.  ***  She tolerates anticoagulation has had no symptomatic paroxysms.  No change in therapy.    HTN: Her blood pressure is ***  elevated but she says this is unusual.  She is going to keep a blood pressure diary and we might need to make med titration based on these home readings.   Decreased exercise tolerance: ***  She was to have a PET scheduled.  However, since her symptoms have improved she wants to hold off on this and I think is reasonable.  She had nonobstructive coronary disease in the past.  She will continue with primary risk reduction.       Follow up ***  Signed, Lynwood Schilling, MD

## 2023-05-02 ENCOUNTER — Ambulatory Visit: Payer: 59 | Admitting: Cardiology

## 2023-05-02 ENCOUNTER — Ambulatory Visit: Payer: Self-pay | Admitting: Family

## 2023-05-02 DIAGNOSIS — I10 Essential (primary) hypertension: Secondary | ICD-10-CM

## 2023-05-02 DIAGNOSIS — I480 Paroxysmal atrial fibrillation: Secondary | ICD-10-CM

## 2023-05-02 NOTE — Telephone Encounter (Signed)
 Pt has an appt scheduled for 1/9

## 2023-05-02 NOTE — Telephone Encounter (Signed)
 Copied from CRM [id number]. Topic: Clinical - Pink Word Triage >> May 02, 2023  2:20 PM Delon T wrote: Reason for Triage: Was diagnosed with RSV a week ago finished the prednisone  but still coughing and wheezing which is causing pain her back. Oxygen  level is normal. Please call patient [phone]    Chief Complaint: Cough Symptoms: Cough, wheezing Frequency: Ongoing for a week Pertinent Negatives: Patient denies fever Disposition: [] ED /[] Urgent Care (no appt availability in office) / [x] Appointment(In office/virtual)/ []  [facility]/ [] Home Care/ [] Refused Recommended Disposition /[] [facility]/ []  Follow-up with PCP  Additional Notes: Patent stated she is having cough and wheezing and it keeps her up at night. She denies feeling short of breath. She stated her oxygen  levels have been at 96-97 lately. She completed Prednisone  yesterday which helped a little bit. She stated she is still taking Mucinex . Her symptoms are still very bothersome. Appointment has been scheduled for 1/9.    Finished Prednisone  Reason for Disposition  [1] Continuous (nonstop) coughing interferes with work or school AND [2] no improvement using cough treatment per Care Advice  Answer Assessment - Initial Assessment Questions 1. ONSET: When did the cough begin?      1 week ago 2. SEVERITY: How bad is the cough today?      Bad but not severe 3. SPUTUM: Describe the color of your sputum (none, dry cough; clear, white, yellow, green)     Yellow/brown  5. DIFFICULTY BREATHING: Are you having difficulty breathing? If Yes, ask: How bad is it? (e.g., mild, moderate, severe)    - MILD: No SOB at rest, mild SOB with walking, speaks normally in sentences, can lie down, no retractions, pulse < 100.    - MODERATE: SOB at rest, SOB with minimal exertion and prefers to sit, cannot lie down flat, speaks in phrases, mild retractions, audible wheezing, pulse 100-120.    - SEVERE: Very SOB at  rest, speaks in single words, struggling to breathe, sitting hunched forward, retractions, pulse > 120      No  6. FEVER: Do you have a fever? If Yes, ask: What is your temperature, how was it measured, and when did it start?     No  have a history of blood clots? (or: recent  10. OTHER SYMPTOMS: Do you have any other symptoms? (e.g., runny nose, wheezing, chest pain)       Wheezing, back pain when coughing  Protocols used: Cough - Acute Productive-A-AH

## 2023-05-03 ENCOUNTER — Ambulatory Visit: Payer: Managed Care, Other (non HMO) | Admitting: Family Medicine

## 2023-05-03 ENCOUNTER — Ambulatory Visit (INDEPENDENT_AMBULATORY_CARE_PROVIDER_SITE_OTHER): Payer: Managed Care, Other (non HMO)

## 2023-05-03 VITALS — BP 127/67 | HR 75 | Temp 98.5°F | Ht 65.0 in | Wt 160.4 lb

## 2023-05-03 DIAGNOSIS — R058 Other specified cough: Secondary | ICD-10-CM

## 2023-05-03 DIAGNOSIS — R0989 Other specified symptoms and signs involving the circulatory and respiratory systems: Secondary | ICD-10-CM

## 2023-05-03 DIAGNOSIS — J210 Acute bronchiolitis due to respiratory syncytial virus: Secondary | ICD-10-CM | POA: Diagnosis not present

## 2023-05-03 DIAGNOSIS — J0140 Acute pansinusitis, unspecified: Secondary | ICD-10-CM | POA: Diagnosis not present

## 2023-05-03 MED ORDER — AMOXICILLIN-POT CLAVULANATE 875-125 MG PO TABS: 1.0000 | ORAL_TABLET | Freq: Two times a day (BID) | ORAL | 0 refills | Status: AC

## 2023-05-03 MED ORDER — BENZONATATE 100 MG PO CAPS: 100.0000 mg | ORAL_CAPSULE | Freq: Three times a day (TID) | ORAL | 0 refills | Status: DC | PRN

## 2023-05-03 NOTE — Patient Instructions (Signed)
Respiratory Syncytial Virus Infection, Adult Respiratory syncytial virus (RSV) infection is an infection caused by RSV, a common virus. This virus is similar to viruses that cause the common cold and the flu. RSV infection can affect the nose, throat, windpipe, and lungs (respiratory system). When the infection is severe, it can cause: Bronchiolitis. This condition causes inflammation of the air passages in the lungs (bronchioles). Pneumonia. This condition causes inflammation of the air sacs in the lungs. RSV infection spreads from person to person (is contagious) through droplets from coughs and sneezes (respiratory secretions). This condition is rarely serious when it occurs in adults. What are the causes? This condition is caused by contact with RSV. This can happen by: Breathing respiratory secretions from someone who has the infection. Touching something that has been exposed to the virus (is contaminated) and then touching your mouth, nose, or eyes. Coming in close contact with someone who has this infection. This may happen if you: Hug or kiss. Shake or hold hands. Eat or drink using the same dishes or utensils. What increases the risk? The following factors may make you more likely to develop this condition: Being 65 years of age or older. Having certain health conditions, including: A long-term (chronic) lung condition, such as chronic obstructive pulmonary disease (COPD). An immune system that is weak. This is your body's defense system. Down syndrome. Heart disease. Working in a hospital or other health care facility. Living in a long-term health care facility. RSV infections are most common from the months of November to April, but they can happen any time of year. What are the signs or symptoms? Symptoms of this condition include: Having a runny nose. Coughing. You may have a cough that brings up mucus (productive cough). Sneezing. Having a fever. Wanting to eat less than  usual. Breathing loudly (wheezing). Having shortness of breath. Having fluid build up in the lungs (respiratory distress). How is this diagnosed? This condition may be diagnosed based on: Your symptoms. Your medical history. A physical exam. A chest X-ray to rule out pneumonia. Blood tests or tests of mucus from your lungs (sputum). These tests may be done for older adults. A test of a sample of your respiratory secretions. How is this treated? In most cases, the RSV infection will go away after 1-2 weeks of caring for yourself at home.  Sometimes, RSV infection is severe and can cause bronchiolitis or pneumonia. If you develop one or both of these conditions, you may need to be treated in the hospital. You may be given: Oxygen therapy. Antiviral medicine. Medicines to open your bronchioles (bronchodilators). Follow these instructions at home: Medicines Take over-the-counter and prescription medicines only as told by your health care provider. If you were prescribed an antiviral medicine, take it as told by your health care provider. Do not stop using the antiviral even if you start to feel better. Lifestyle  Eat a healthy diet. Do not drink alcohol. Do not use any products that contain nicotine or tobacco, such as cigarettes, e-cigarettes, and chewing tobacco. If you need help quitting, ask your health care provider. Rest at home until your symptoms go away. Return to your normal activities as told by your health care provider. Ask your health care provider what activities are safe for you. General instructions  Drink enough fluid to keep your urine pale yellow. Gargle with a salt-water mixture 3-4 times a day or as needed. To make a salt-water mixture, completely dissolve -1 tsp (3-6 g) of salt in 1   cup (237 mL) of warm water. Keep all follow-up visits as told by your health care provider. This is important. How is this prevented? To prevent catching and spreading RSV: Wash  your hands often with soap and water for at least 20 seconds. If soap and water are not available, use hand sanitizer. Do not touch your face without first cleaning your hands. Stay home if you have symptoms of the common cold or the flu. Cover your nose and mouth when you cough or sneeze. Avoid large groups of people. Keep a safe distance of about 6 feet (1.8 m) from people who are coughing or sneezing. Where to find more information Centers for Disease Control and Prevention: www.cdc.gov Contact a health care provider if: Your symptoms get worse or have not changed after 2 weeks. You have: A fever. Hot flashes, sweating, or chills that keep happening. A cough that brings up much more mucus than usual. A cough that brings up blood. You feel: Very tired (lethargic). Confused. Get help right away if: You have increased or severe trouble breathing. You lose consciousness. These symptoms may represent a serious problem that is an emergency. Do not wait to see if the symptoms will go away. Get medical help right away. Call your local emergency services (911 in the [place] Do not drive yourself to the hospital. Summary Respiratory syncytial virus (RSV) infection is an infection caused by RSV, a common virus. RSV infection can affect the nose, throat, windpipe, and lungs (respiratory system). When the infection is severe, it can cause bronchiolitis or pneumonia. Take over-the-counter and prescription medicines only as told by your health care provider. Contact a health care provider if your symptoms get worse or have not changed after 2 weeks. This information is not intended to replace advice given to you by your health care provider. Make sure you discuss any questions you have with your health care provider. Document Revised: 01/22/2019 Document Reviewed: 01/29/2019 Elsevier Patient Education  2022 [employer]  

## 2023-05-03 NOTE — Progress Notes (Signed)
 Acute Office Visit  Subjective:     Patient ID: Kathryn Arnold, female    DOB: [birth year], 78 y.o.   MRN: [id number]  Chief Complaint  Patient presents with   Cough    Cough This is a new problem. Episode onset: 10 days. The problem has been gradually worsening. The problem occurs every few minutes. The cough is Productive of sputum (brownish). Associated symptoms include ear congestion, ear pain, headaches (frontal and maxillary sinus pressure x 4 days), myalgias, nasal congestion, rhinorrhea, shortness of breath (with cough) and wheezing (intermittent). Pertinent negatives include no chest pain, chills, fever or sore throat. The symptoms are aggravated by lying down. She has tried OTC cough suppressant and oral steroids for the symptoms. The treatment provided moderate relief.   + RSV on 04/25/23 at [facility]. Prescribed prednisone  burst on 04/27/23 and negative CXR per OV note.   Review of Systems  Constitutional:  Negative for chills and fever.  HENT:  Positive for ear pain and rhinorrhea. Negative for sore throat.   Respiratory:  Positive for cough, shortness of breath (with cough) and wheezing (intermittent).   Cardiovascular:  Negative for chest pain.  Musculoskeletal:  Positive for myalgias.  Neurological:  Positive for headaches (frontal and maxillary sinus pressure x 4 days).        Objective:    BP 127/67   Pulse 75   Temp 98.5 F (36.9 C) (Temporal)   Ht 5' 5 (1.651 m)   Wt 160 lb 6.4 oz (72.8 kg)   SpO2 96%   BMI 26.69 kg/m    Physical Exam Vitals and nursing note reviewed.  Constitutional:      General: She is not in acute distress.    Appearance: She is ill-appearing. She is not toxic-appearing or diaphoretic.  HENT:     Head: Normocephalic and atraumatic.     Right Ear: Ear canal and external ear normal. A middle ear effusion is present. Tympanic membrane is not perforated, erythematous, retracted or bulging.     Left Ear: Ear canal and external ear normal. A  middle ear effusion is present. Tympanic membrane is bulging. Tympanic membrane is not perforated, erythematous or retracted.     Nose: Congestion present.     Right Sinus: Maxillary sinus tenderness and frontal sinus tenderness present.     Left Sinus: Maxillary sinus tenderness and frontal sinus tenderness present.     Mouth/Throat:     Mouth: Mucous membranes are moist.     Pharynx: Oropharynx is clear. No oropharyngeal exudate or posterior oropharyngeal erythema.  Eyes:     General:        Right eye: No discharge.        Left eye: No discharge.     Conjunctiva/sclera: Conjunctivae normal.  Cardiovascular:     Rate and Rhythm: Normal rate and regular rhythm.     Heart sounds: Normal heart sounds. No murmur heard. Pulmonary:     Effort: Pulmonary effort is normal. No respiratory distress.     Breath sounds: Examination of the right-upper field reveals rhonchi. Examination of the left-upper field reveals rhonchi. Examination of the right-middle field reveals rhonchi. Examination of the left-middle field reveals rhonchi. Examination of the right-lower field reveals rhonchi. Examination of the left-lower field reveals rhonchi. Rhonchi present. No wheezing.  Musculoskeletal:     Cervical back: Neck supple. No rigidity.     Right lower leg: No edema.     Left lower leg: No edema.  Lymphadenopathy:  Cervical: No cervical adenopathy.  Skin:    General: Skin is warm.  Neurological:     General: No focal deficit present.     Mental Status: She is alert and oriented to person, place, and time.  Psychiatric:        Mood and Affect: Mood normal.        Behavior: Behavior normal.     No results found for any visits on 05/03/23.      Assessment & Plan:   Shacora was seen today for cough.  Diagnoses and all orders for this visit:  RSV (acute bronchiolitis due to respiratory syncytial virus) -     DG Chest 2 View; Future  Productive cough -     DG Chest 2 View; Future -      amoxicillin -clavulanate (AUGMENTIN ) 875-125 MG tablet; Take 1 tablet by mouth 2 (two) times daily for 7 days.  Rhonchi -     DG Chest 2 View; Future  Acute non-recurrent pansinusitis -     benzonatate  (TESSALON  PERLES) 100 MG capsule; Take 1 capsule (100 mg total) by mouth 3 (three) times daily as needed.   Reviewed OV notes from 04/25/23 and 04/27/23. Reviewed CXR from 04/27/23. No acute findings on CXR today compared to prior CXR. Agree with radiology read. New sinus pressure x 4 days with bulging left TM. Will treat with augmentin  for secondary bacterial sinusitis. Discussed symptomatic care for RSV. Return to office for new or worsening symptoms, or if symptoms persist.   The patient indicates understanding of these issues and agrees with the plan.   [name], FNP

## 2023-05-06 ENCOUNTER — Telehealth: Payer: Self-pay

## 2023-05-06 NOTE — Telephone Encounter (Signed)
 Copied from CRM [id number]. Topic: Clinical - Medical Advice >> May 04, 2023  2:40 PM Delon T wrote: Reason for CRM: Sharp pain in lower back when coughing, making it hard to move. Just started today, wants to know if she can come in for an injection today, please call patient [phone]

## 2023-05-07 ENCOUNTER — Ambulatory Visit: Payer: Managed Care, Other (non HMO) | Admitting: Family Medicine

## 2023-05-07 ENCOUNTER — Encounter: Payer: Self-pay | Admitting: Family Medicine

## 2023-05-07 VITALS — BP 148/82 | HR 78 | Temp 98.2°F | Ht 65.0 in | Wt 162.6 lb

## 2023-05-07 DIAGNOSIS — M545 Low back pain, unspecified: Secondary | ICD-10-CM | POA: Diagnosis not present

## 2023-05-07 DIAGNOSIS — I10 Essential (primary) hypertension: Secondary | ICD-10-CM

## 2023-05-07 DIAGNOSIS — J210 Acute bronchiolitis due to respiratory syncytial virus: Secondary | ICD-10-CM

## 2023-05-07 MED ORDER — METHYLPREDNISOLONE ACETATE 80 MG/ML IJ SUSP
80.0000 mg | Freq: Once | INTRAMUSCULAR | Status: AC
  Administered 2023-05-07: 80 mg via INTRAMUSCULAR

## 2023-05-07 MED ORDER — HYDROCODONE BIT-HOMATROP MBR 5-1.5 MG/5ML PO SOLN: 5.0000 mL | Freq: Four times a day (QID) | ORAL | 0 refills | Status: DC | PRN

## 2023-05-07 NOTE — Progress Notes (Signed)
 Acute Office Visit  Subjective:     Patient ID: Kathryn Arnold, female    DOB: 11/02/1945, 78 y.o.   MRN: [id number]  Chief Complaint  Patient presents with   Cough   Back Pain    Back Pain This is a new problem. Episode onset: 3 days. The problem has been gradually worsening since onset. The pain is present in the lumbar spine. The quality of the pain is described as cramping. The pain does not radiate. The pain is moderate. The symptoms are aggravated by coughing, twisting and position. Pertinent negatives include no abdominal pain, bladder incontinence, bowel incontinence, fever, leg pain, numbness, paresis, paresthesias, tingling or weakness. Treatments tried: lidocaine , flexeril. The treatment provided mild relief.   + RSV on 04/25/23. Cough has some improved. Continues to have some intermittent wheezing. Has coughing fits with some shortness of breath. No fever or chest pain. Sinus pressure has improved. She has tried tessalon  perles, prednisone  taper, Augmentin , mucinex . Has had negative CXR x2  Review of Systems  Constitutional:  Negative for fever.  Gastrointestinal:  Negative for abdominal pain and bowel incontinence.  Genitourinary:  Negative for bladder incontinence.  Musculoskeletal:  Positive for back pain.  Neurological:  Negative for tingling, weakness, numbness and paresthesias.        Objective:    BP (!) 148/82   Pulse 78   Temp 98.2 F (36.8 C) (Temporal)   Ht 5' 5 (1.651 m)   Wt 162 lb 9.6 oz (73.8 kg)   SpO2 96%   BMI 27.06 kg/m    Physical Exam Vitals and nursing note reviewed.  Constitutional:      General: She is not in acute distress.    Appearance: She is not ill-appearing, toxic-appearing or diaphoretic.  Cardiovascular:     Rate and Rhythm: Normal rate and regular rhythm.     Heart sounds: Normal heart sounds. No murmur heard. Pulmonary:     Effort: Pulmonary effort is normal. No respiratory distress.     Breath sounds: Normal breath  sounds. No wheezing, rhonchi or rales.  Chest:     Chest wall: No tenderness.  Musculoskeletal:     Lumbar back: Tenderness (paraspinal) present. No swelling, edema, deformity, signs of trauma, spasms or bony tenderness. Negative right straight leg raise test and negative left straight leg raise test.  Skin:    General: Skin is warm and dry.  Neurological:     General: No focal deficit present.     Mental Status: She is alert and oriented to person, place, and time.  Psychiatric:        Mood and Affect: Mood normal.        Behavior: Behavior normal.     No results found for any visits on 05/07/23.      Assessment & Plan:   Kathryn Arnold was seen today for cough and back pain.  Diagnoses and all orders for this visit:  RSV (acute bronchiolitis due to respiratory syncytial virus) -     methylPREDNISolone  acetate (DEPO-MEDROL ) injection 80 mg -     HYDROcodone  bit-homatropine (HYCODAN) 5-1.5 MG/5ML syrup; Take 5 mLs by mouth every 6 (six) hours as needed for cough.  Acute bilateral low back pain without sciatica -     methylPREDNISolone  acetate (DEPO-MEDROL ) injection 80 mg -     HYDROcodone  bit-homatropine (HYCODAN) 5-1.5 MG/5ML syrup; Take 5 mLs by mouth every 6 (six) hours as needed for cough.  Essential hypertension, benign  Discussed muscle strain from coughing  due to RSV. RSV symtpoms are improving but she is still having some wheezing. Depo medrol  IM injection today. Hycodan prn. Discussed to use sparingly and cautiously due to hydrocodone . Do not take hycodan with flexeril. PDMP reviewed no red flags. Rest, lidocaine  for back. BP is mildly elevated. Monitor at home and notify for elevated readings. Return to office for new or worsening symptoms, or if symptoms persist.   The patient indicates understanding of these issues and agrees with the plan.  The patient indicates understanding of these issues and agrees with the plan.  [name], FNP

## 2023-05-07 NOTE — Telephone Encounter (Signed)
 Scheduled appt for today

## 2023-05-15 ENCOUNTER — Encounter: Payer: Self-pay | Admitting: Family Medicine

## 2023-05-15 ENCOUNTER — Ambulatory Visit: Payer: Managed Care, Other (non HMO) | Admitting: Family Medicine

## 2023-05-15 ENCOUNTER — Ambulatory Visit (INDEPENDENT_AMBULATORY_CARE_PROVIDER_SITE_OTHER): Payer: Managed Care, Other (non HMO)

## 2023-05-15 VITALS — BP 140/78 | HR 83 | Temp 98.6°F | Wt 156.0 lb

## 2023-05-15 DIAGNOSIS — M545 Low back pain, unspecified: Secondary | ICD-10-CM | POA: Diagnosis not present

## 2023-05-15 DIAGNOSIS — I4819 Other persistent atrial fibrillation: Secondary | ICD-10-CM | POA: Diagnosis not present

## 2023-05-15 DIAGNOSIS — Z7901 Long term (current) use of anticoagulants: Secondary | ICD-10-CM

## 2023-05-15 MED ORDER — METHOCARBAMOL 500 MG PO TABS: 500.0000 mg | ORAL_TABLET | Freq: Three times a day (TID) | ORAL | 0 refills | Status: DC | PRN

## 2023-05-15 MED ORDER — LIDOCAINE 5 % EX PTCH: 1.0000 | MEDICATED_PATCH | CUTANEOUS | 0 refills | Status: DC

## 2023-05-15 NOTE — Patient Instructions (Addendum)
Use the methocarbamol with caution. It can cause sleepiness If Lidoderm patch too expensive, get SalonPas lidocaine patches over the counter NO MOTRIN/ ALEVE/ ADVIL. This can cause increased bleeding with your xarelto No Celebrex (this is in the same class that can cause worse bleeding and it has a higher risk of heart damage)

## 2023-05-15 NOTE — Progress Notes (Signed)
Subjective: CC: Back pain PCP: Junie Spencer, FNP [id number]:VHQIONG L Batch is a 78 y.o. female presenting to clinic today for:  1.  Back pain Patient reports that she was diagnosed with RSV earlier in the month.  She reports over the last couple of weeks she has been coughing so badly that she hurt her low back.  She was placed on steroids for the RSV and got a shot last visit but notes that symptoms really did not seem to improve.  She has been on antibiotics for secondary infection as well.  She notes that back pain is worse with flexion and getting up from a seated position but is relieved by standing.  She reports no radicular symptoms or radiation of pain.  She points to the low back as the area of discomfort.  She reports that needing to have a bowel movement seems to make discomfort a little worse.  However she denies constipation, blood in stool etc.  She notes that she has used ibuprofen with last dose in the last 24 hours.  She apparently was prescribed Celebrex back in December but does not recall actually taking the medication and does not think that it is in her med box.  She is going to check when she gets home.  She has a history of atrial fibrillation and has been chronically anticoagulated with Xarelto for years now.  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 DO NOT LIE DOWN FOR 30 MINUTES. REMAIN UPRIGHT, Disp: 12 tablet, Rfl: 0   Ascorbic Acid (VITAMIN C PO), Take one tablet by mouth daily, Disp: , Rfl:    Calcium Citrate-Vitamin D (CITRACAL + D PO), Take 500 mg by mouth 2 (two) times daily., Disp: , Rfl:    cetirizine (ZYRTEC ALLERGY) 10 MG tablet, Take 1 tablet (10 mg total) by mouth daily., Disp: 90 tablet, Rfl: 1   diltiazem (CARDIZEM) 60 MG tablet, Take 0.5-1 tablets (30-60 mg total) by mouth every 6 (six) hours as needed (Heart rate over 100 as long as systolic blood pressure >100)., Disp: 20 tablet, Rfl: 1   diltiazem (CARTIA XT) 180 MG 24 hr capsule, Take 1 capsule by mouth once daily, Disp: 90 capsule, Rfl: 1   flecainide (TAMBOCOR) 50 MG tablet, Take 1 tablet by mouth twice daily, Disp: 180 tablet, Rfl: 1   fluticasone (FLONASE) 50 MCG/ACT nasal spray, Place 2 sprays into both nostrils daily., Disp: 16 g, Rfl: 6   glucosamine-chondroitin 500-400 MG tablet, Take 2 tablets by mouth daily., Disp: , Rfl:    HYDROcodone bit-homatropine (HYCODAN)  5-1.5 MG/5ML syrup, Take 5 mLs by mouth every 6 (six) hours as needed for cough., Disp: 120 mL, Rfl: 0   lidocaine (LIDODERM) 5 %, Place 1 patch onto the skin daily. Remove & Discard patch within 12 hours or as directed by MD, Disp: 30 patch, Rfl: 0   Magnesium 250 MG TABS, Take 1 tablet by mouth every evening., Disp: , Rfl:    meclizine (ANTIVERT) 25 MG tablet, Take 1 tablet (25 mg total) by mouth 3 (three) times daily as needed for dizziness., Disp: 90 tablet, Rfl: 1   methocarbamol (ROBAXIN) 500 MG tablet, Take 1 tablet (500 mg total) by mouth every 8 (eight) hours as needed for muscle spasms., Disp: 30 tablet, Rfl: 0   Multiple Vitamins-Minerals (CENTRUM SILVER PO), Take 1 tablet by mouth daily., Disp: , Rfl:    rosuvastatin (CRESTOR) 20 MG  tablet, TAKE 1 TABLET BY MOUTH AT BEDTIME, Disp: 90 tablet, Rfl: 0   valACYclovir (VALTREX) 500 MG tablet, Take 1 tablet (500 mg total) by mouth daily. (Patient taking differently: Take 500 mg by mouth daily. Every other day), Disp: 90 tablet, Rfl: 2   XARELTO 20 MG TABS tablet, TAKE 1 TABLET BY MOUTH AT BEDTIME, Disp: 90 tablet, Rfl: 0 Social History   Socioeconomic History   Marital status: Married    Spouse name: Not on file   Number of children: Not on file   Years of education: Not on file   Highest education level: Not on file  Occupational History   Not on file  Tobacco Use   Smoking status: Never   Smokeless tobacco: Never  Vaping Use   Vaping status: Never Used  Substance and Sexual Activity   Alcohol use: No   Drug use: No   Sexual activity: Not Currently    Birth control/protection: None  Other Topics Concern   Not on file  Social History Narrative   Lives in [place] with spouse.   Social Drivers of Corporate investment banker Strain: Not on file  Food Insecurity: Not on file  Transportation Needs: Not on file  Physical Activity: Not on file  Stress: Not on file  Social Connections: Not on file  Intimate Partner Violence: Not At Risk (03/13/2018)   Humiliation, Afraid, Rape, and Kick questionnaire    Fear of Current or Ex-Partner: No    Emotionally Abused: No    Physically Abused: No    Sexually Abused: No   Family History  Problem Relation Age of Onset   Osteoporosis Mother    Cancer Mother        breast   Hypertension Mother    Heart disease Father    Other Neg Hx     Objective: Office vital signs reviewed. BP (!) 140/78   Pulse 83   Temp 98.6 F (37 C)   Wt 156 lb (70.8 kg)   SpO2 95%   BMI 25.96 kg/m   Physical Examination:  General: Awake, alert, appears uncomfortable MSK: Antalgic gait.  Utilizing cane for stability but is able to ambulate independently.  She has limited flexion secondary to pain and discomfort.  Requires 2 hands to  push up from a seated position.  She has no tenderness to palpation to the lumbar spine, sacroiliac joint or lumbosacral junction.  No palpable bony abnormalities  Assessment/ Plan: 77 y.o. female   Acute midline low back pain without sciatica - Plan: DG Lumbar Spine 2-3 Views, methocarbamol (ROBAXIN) 500 MG tablet, lidocaine (LIDODERM) 5 %  Persistent atrial fibrillation (HCC) - Plan: methocarbamol (ROBAXIN) 500 MG tablet, lidocaine (LIDODERM) 5 %  Chronic anticoagulation - Plan: methocarbamol (ROBAXIN) 500 MG tablet, lidocaine (LIDODERM) 5 %  I personally reviewed x-rays which did demonstrate some osteophyte suggestive of underlying spondylosis in the back but I cannot appreciate any compression fractures or decreased disc spaces to suggest degenerative disc disease.  Her pain was not reproducible on exam I do think it some more likely to be lumbar strain in the setting of recent illness and coughing.  I am going to treat her symptomatically with Robaxin, lidocaine, heat and stretches.  We discussed the symptoms are not really not improving, low threshold to have her see specialist including physical therapist for rehabilitation.  I advised her against NSAIDs given history of atrial fibrillation that is anticoagulated.  We discussed the risk of bleeding with oral NSAIDs.  Tylenol is fine.  Follow-up with PCP   Raliegh Ip, DO [facility] [phone]

## 2023-05-24 ENCOUNTER — Other Ambulatory Visit: Payer: Self-pay | Admitting: Family

## 2023-05-24 DIAGNOSIS — E785 Hyperlipidemia, unspecified: Secondary | ICD-10-CM

## 2023-06-01 ENCOUNTER — Other Ambulatory Visit: Payer: Self-pay | Admitting: Family

## 2023-06-01 DIAGNOSIS — I10 Essential (primary) hypertension: Secondary | ICD-10-CM

## 2023-06-01 DIAGNOSIS — I480 Paroxysmal atrial fibrillation: Secondary | ICD-10-CM

## 2023-06-03 ENCOUNTER — Other Ambulatory Visit: Payer: Self-pay | Admitting: Family

## 2023-06-03 DIAGNOSIS — B009 Herpesviral infection, unspecified: Secondary | ICD-10-CM

## 2023-06-04 ENCOUNTER — Other Ambulatory Visit: Payer: Self-pay | Admitting: *Deleted

## 2023-06-04 DIAGNOSIS — M810 Age-related osteoporosis without current pathological fracture: Secondary | ICD-10-CM

## 2023-06-04 MED ORDER — ALENDRONATE SODIUM 70 MG PO TABS: ORAL_TABLET | ORAL | 0 refills | Status: DC

## 2023-06-05 ENCOUNTER — Encounter: Payer: Self-pay | Admitting: Family

## 2023-06-05 ENCOUNTER — Other Ambulatory Visit: Payer: Self-pay | Admitting: Family

## 2023-06-05 ENCOUNTER — Telehealth (INDEPENDENT_AMBULATORY_CARE_PROVIDER_SITE_OTHER): Payer: Managed Care, Other (non HMO) | Admitting: Family

## 2023-06-05 ENCOUNTER — Other Ambulatory Visit: Payer: Self-pay | Admitting: Family Medicine

## 2023-06-05 DIAGNOSIS — I10 Essential (primary) hypertension: Secondary | ICD-10-CM | POA: Diagnosis not present

## 2023-06-05 DIAGNOSIS — M545 Low back pain, unspecified: Secondary | ICD-10-CM

## 2023-06-05 DIAGNOSIS — M810 Age-related osteoporosis without current pathological fracture: Secondary | ICD-10-CM

## 2023-06-05 DIAGNOSIS — E785 Hyperlipidemia, unspecified: Secondary | ICD-10-CM

## 2023-06-05 DIAGNOSIS — I4819 Other persistent atrial fibrillation: Secondary | ICD-10-CM | POA: Diagnosis not present

## 2023-06-05 DIAGNOSIS — I480 Paroxysmal atrial fibrillation: Secondary | ICD-10-CM

## 2023-06-05 DIAGNOSIS — F411 Generalized anxiety disorder: Secondary | ICD-10-CM

## 2023-06-05 DIAGNOSIS — M1990 Unspecified osteoarthritis, unspecified site: Secondary | ICD-10-CM | POA: Diagnosis not present

## 2023-06-05 DIAGNOSIS — B009 Herpesviral infection, unspecified: Secondary | ICD-10-CM

## 2023-06-05 DIAGNOSIS — E559 Vitamin D deficiency, unspecified: Secondary | ICD-10-CM

## 2023-06-05 MED ORDER — RIVAROXABAN 20 MG PO TABS: 20.0000 mg | ORAL_TABLET | Freq: Every day | ORAL | 0 refills | Status: DC

## 2023-06-05 MED ORDER — FLECAINIDE ACETATE 50 MG PO TABS: 50.0000 mg | ORAL_TABLET | Freq: Two times a day (BID) | ORAL | 1 refills | Status: DC

## 2023-06-05 MED ORDER — DILTIAZEM HCL ER COATED BEADS 180 MG PO CP24: 180.0000 mg | ORAL_CAPSULE | Freq: Every day | ORAL | 0 refills | Status: DC

## 2023-06-05 MED ORDER — ALENDRONATE SODIUM 70 MG PO TABS: ORAL_TABLET | ORAL | 0 refills | Status: DC

## 2023-06-05 MED ORDER — ROSUVASTATIN CALCIUM 20 MG PO TABS: 20.0000 mg | ORAL_TABLET | Freq: Every day | ORAL | 0 refills | Status: DC

## 2023-06-05 MED ORDER — VALACYCLOVIR HCL 500 MG PO TABS: 500.0000 mg | ORAL_TABLET | Freq: Every day | ORAL | 0 refills | Status: DC

## 2023-06-05 MED ORDER — DILTIAZEM HCL 60 MG PO TABS: 30.0000 mg | ORAL_TABLET | Freq: Four times a day (QID) | ORAL | 1 refills | Status: AC | PRN

## 2023-06-05 MED ORDER — PREDNISONE 10 MG (21) PO TBPK: ORAL_TABLET | ORAL | 0 refills | Status: DC

## 2023-06-05 NOTE — Progress Notes (Signed)
Virtual Visit Consent   Kathryn Arnold, you are scheduled for a virtual visit with a [facility] provider today. Just as with appointments in the office, your consent must be obtained to participate. Your consent will be active for this visit and any virtual visit you may have with one of our providers in the next 365 days. If you have a MyChart account, a copy of this consent can be sent to you electronically.  As this is a virtual visit, video technology does not allow for your provider to perform a traditional examination. This may limit your provider's ability to fully assess your condition. If your provider identifies any concerns that need to be evaluated in person or the need to arrange testing (such as labs, EKG, etc.), we will make arrangements to do so. Although advances in technology are sophisticated, we cannot ensure that it will always work on either your end or our end. If the connection with a video visit is poor, the visit may have to be switched to a telephone visit. With either a video or telephone visit, we are not always able to ensure that we have a secure connection.  By engaging in this virtual visit, you consent to the provision of healthcare and authorize for your insurance to be billed (if applicable) for the services provided during this visit. Depending on your insurance coverage, you may receive a charge related to this service.  I need to obtain your verbal consent now. Are you willing to proceed with your visit today? [name] has provided verbal consent on 06/05/2023 for a virtual visit (video or telephone). [name], FNP  Date: 06/05/2023 10:37 AM   Virtual Visit via Video Note   I, [name], connected with  Kathryn Arnold  ([id number], 1945/11/09) on 06/05/23 at 10:10 AM EST by a video-enabled telemedicine application and verified that I am speaking with the correct person using two identifiers.  Location: Patient: Virtual Visit Location Patient:  Home Provider: Virtual Visit Location Provider: Home Office   I discussed the limitations of evaluation and management by telemedicine and the availability of in person appointments. The patient expressed understanding and agreed to proceed.    History of Present Illness: Kathryn Arnold is a 78 y.o. who identifies as a female who was assigned female at birth, and is being seen today for for chronic follow up . Pt is followed by Cardiologists for A Fib  annually. Had an ablation on 10/31/16 and 2016.  She had cardioversion in 08/21. States since changing her diet and her procedures her A fib has improved.  She takes Xarelto 20 mg daily.    She has osteoporosis and takes Fosamax weekly. Her last Dexa scan was 06/17/21. [name]  She is complaining of low back pain since having RSV last month and coughing so much.  HPI: Hypertension This is a recurrent problem. The current episode started more than 1 year ago. The problem has been resolved since onset. The problem is controlled. Associated symptoms include anxiety. Pertinent negatives include no malaise/fatigue, peripheral edema or shortness of breath. The current treatment provides moderate improvement.  Arthritis Presents for follow-up visit. She complains of pain and stiffness. The symptoms have been stable. Affected location: back. Her pain is at a severity of 0/10.  Hyperlipidemia This is a chronic problem. The current episode started more than 1 year ago. Pertinent negatives include no shortness of breath. Current antihyperlipidemic treatment includes statins. The current treatment provides moderate improvement of  lipids. Risk factors for coronary artery disease include dyslipidemia, hypertension and a sedentary lifestyle.  Anxiety Presents for follow-up visit. Symptoms include excessive worry and nervous/anxious behavior. Patient reports no shortness of breath. Symptoms occur occasionally. The severity of symptoms is mild.      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 DO NOT LIE DOWN FOR 30 MINUTES. REMAIN UPRIGHT, Disp: 12 tablet, Rfl: 0   Ascorbic Acid (VITAMIN C PO), Take one tablet by mouth daily, Disp: , Rfl:    Calcium Citrate-Vitamin D (CITRACAL + D PO), Take 500 mg by mouth 2 (two) times daily., Disp: , Rfl:    cetirizine (ZYRTEC ALLERGY) 10 MG tablet, Take 1 tablet (10 mg total) by mouth daily., Disp: 90 tablet, Rfl: 1   diltiazem (CARDIZEM CD) 180 MG 24 hr capsule, Take 1 capsule (180 mg total) by mouth daily., Disp: 90 capsule, Rfl: 0   diltiazem (CARDIZEM) 60 MG tablet, Take 0.5-1 tablets (30-60 mg total) by mouth every 6 (six) hours as needed (Heart rate over 100 as long as systolic blood pressure >100)., Disp: 20 tablet, Rfl: 1   flecainide (TAMBOCOR) 50 MG tablet, Take 1 tablet (50 mg total) by mouth 2 (two) times daily., Disp: 180 tablet, Rfl: 1   fluticasone (FLONASE) 50 MCG/ACT nasal spray, Place 2 sprays into both nostrils daily., Disp: 16 g, Rfl: 6   glucosamine-chondroitin 500-400 MG tablet, Take 2 tablets by mouth daily., Disp: , Rfl:    lidocaine (LIDODERM) 5 %, Place 1 patch onto the skin daily. Remove & Discard patch within 12 hours or as directed by MD,  Disp: 30 patch, Rfl: 0   Magnesium 250 MG TABS, Take 1 tablet by mouth every evening., Disp: , Rfl:    meclizine (ANTIVERT) 25 MG tablet, Take 1 tablet (25 mg total) by mouth 3 (three) times daily as needed for dizziness., Disp: 90 tablet, Rfl: 1   methocarbamol (ROBAXIN) 500 MG tablet, Take 1 tablet (500 mg total) by mouth every 8 (eight) hours as needed for muscle spasms., Disp: 30 tablet, Rfl: 0   Multiple Vitamins-Minerals (CENTRUM SILVER PO), Take 1 tablet by mouth daily., Disp: , Rfl:    rivaroxaban (XARELTO) 20 MG TABS tablet, Take 1 tablet (20 mg total) by mouth at bedtime., Disp: 90 tablet, Rfl: 0   rosuvastatin (CRESTOR) 20 MG tablet, Take 1 tablet (20 mg total) by mouth at bedtime., Disp: 90 tablet, Rfl: 0   valACYclovir (VALTREX) 500 MG tablet, Take 1 tablet (500 mg total) by mouth daily., Disp: 90 tablet, Rfl: 0  Observations/Objective: Patient is well-developed, well-nourished in no acute distress.  Resting comfortably  at home.  Head is normocephalic, atraumatic.  No labored breathing.  Speech is clear and coherent with logical content.  Patient is alert and oriented at baseline.    Assessment and Plan: 1. Arthritis (Primary)  2. Persistent atrial fibrillation (HCC)  3. Essential hypertension, benign - diltiazem (CARDIZEM CD) 180 MG 24 hr capsule; Take 1 capsule (180 mg total) by mouth daily.  Dispense: 90 capsule; Refill: 0 - diltiazem (CARDIZEM) 60 MG tablet; Take 0.5-1 tablets (30-60 mg total) by mouth every 6 (six) hours as needed (Heart rate over 100 as long as systolic blood pressure >100).  Dispense: 20 tablet; Refill: 1  4. GAD (generalized anxiety disorder)  5. Hyperlipidemia with target LDL less than 100 - rosuvastatin (CRESTOR) 20 MG tablet; Take 1 tablet (20 mg total) by mouth at bedtime.  Dispense: 90 tablet; Refill: 0  6. Osteoporosis, unspecified osteoporosis type, unspecified pathological fracture presence - alendronate (FOSAMAX) 70 MG tablet; TAKE 1  TABLET BY MOUTH ONCE A WEEK WITH A FULL GLASS OF WATER ON AN EMPTY STOMACH. DO NOT LIE DOWN FOR 30 MINUTES. REMAIN UPRIGHT  Dispense: 12 tablet; Refill: 0  7. Vitamin D deficiency  8. Paroxysmal atrial fibrillation (HCC) - rivaroxaban (XARELTO) 20 MG TABS tablet; Take 1 tablet (20 mg total) by mouth at bedtime.  Dispense: 90 tablet; Refill: 0 - diltiazem (CARDIZEM CD) 180 MG 24 hr capsule; Take 1 capsule (180 mg total) by mouth daily.  Dispense: 90 capsule; Refill: 0 - flecainide (TAMBOCOR) 50 MG tablet; Take 1 tablet (50 mg total) by mouth 2 (two) times daily.  Dispense: 180 tablet; Refill: 1 - diltiazem (CARDIZEM) 60 MG tablet; Take 0.5-1 tablets (30-60 mg total) by mouth every 6 (six) hours as needed (Heart rate over 100 as long as systolic blood pressure >100).  Dispense: 20 tablet; Refill: 1  9. HSV-2 (herpes simplex virus 2) infection - valACYclovir (VALTREX) 500 MG tablet; Take 1 tablet (500 mg total) by mouth daily.  Dispense: 90 tablet; Refill: 0  10. Acute bilateral low back pain without sciatica - predniSONE (STERAPRED UNI-PAK 21 TAB) 10 MG (21) TBPK tablet; Use as directed  Dispense: 21 tablet; Refill: 0  Labs pending, pt will come in a few days Rest Prednisone for back pain,  ROM exercise  Tylenol as needed  Follow up in 6 months   Follow Up Instructions: I discussed the assessment and treatment plan with the patient. The patient was provided an opportunity to ask questions and all were answered. The patient agreed with the plan and demonstrated an understanding of the instructions.  A copy of instructions were sent to the patient via MyChart unless otherwise noted below.     The patient was advised to call back or seek an in-person evaluation if the symptoms worsen or if the condition fails to improve as anticipated.    [name], FNP

## 2023-06-07 ENCOUNTER — Telehealth: Payer: Self-pay | Admitting: Family

## 2023-06-07 ENCOUNTER — Other Ambulatory Visit: Payer: Medicare Other

## 2023-06-07 DIAGNOSIS — I10 Essential (primary) hypertension: Secondary | ICD-10-CM

## 2023-06-07 DIAGNOSIS — M545 Low back pain, unspecified: Secondary | ICD-10-CM

## 2023-06-07 DIAGNOSIS — E785 Hyperlipidemia, unspecified: Secondary | ICD-10-CM

## 2023-06-07 DIAGNOSIS — M1990 Unspecified osteoarthritis, unspecified site: Secondary | ICD-10-CM

## 2023-06-07 LAB — LIPID PANEL

## 2023-06-07 NOTE — Telephone Encounter (Signed)
Copied from CRM [id number]. Topic: Referral - Request for Referral >> Jun 07, 2023  2:08 PM Gery Pray wrote: Did the patient discuss referral with their provider in the last year? No (If No - schedule appointment) (If Yes - send message)  Appointment offered? No   Type of order/referral and detailed reason for visit: Paint has arthritis in her lumbar and her waist  Preference of office, provider, location: Dr. Alben Deeds [address] [place], [place] Phone: [phone] Fax: [phone]  If referral order, have you been seen by this specialty before? No (If Yes, this issue or another issue? When? Where?  Can we respond through MyChart? No

## 2023-06-08 LAB — CMP14+EGFR
ALT: 19 IU/L (ref 0–32)
AST: 24 IU/L (ref 0–40)
Albumin: 4.7 g/dL (ref 3.8–4.8)
Alkaline Phosphatase: 68 IU/L (ref 44–121)
BUN/Creatinine Ratio: 19 (ref 12–28)
BUN: 14 mg/dL (ref 8–27)
Bilirubin Total: 0.4 mg/dL (ref 0.0–1.2)
CO2: 24 mmol/L (ref 20–29)
Calcium: 9.6 mg/dL (ref 8.7–10.3)
Chloride: 102 mmol/L (ref 96–106)
Creatinine, Ser: 0.75 mg/dL (ref 0.57–1.00)
Globulin, Total: 2.6 g/dL (ref 1.5–4.5)
Glucose: 98 mg/dL (ref 70–99)
Potassium: 4.3 mmol/L (ref 3.5–5.2)
Sodium: 141 mmol/L (ref 134–144)
Total Protein: 7.3 g/dL (ref 6.0–8.5)
eGFR: 83 mL/min/{1.73_m2} (ref >59)

## 2023-06-08 LAB — CBC WITH DIFFERENTIAL/PLATELET
Basophils Absolute: 0 10*3/uL (ref 0.0–0.2)
Basos: 1 %
EOS (ABSOLUTE): 0 10*3/uL (ref 0.0–0.4)
Eos: 1 %
Hematocrit: 44.4 % (ref 34.0–46.6)
Hemoglobin: 14.7 g/dL (ref 11.1–15.9)
Immature Grans (Abs): 0 10*3/uL (ref 0.0–0.1)
Immature Granulocytes: 0 %
Lymphocytes Absolute: 1.5 10*3/uL (ref 0.7–3.1)
Lymphs: 31 %
MCH: 29.8 pg (ref 26.6–33.0)
MCHC: 33.1 g/dL (ref 31.5–35.7)
MCV: 90 fL (ref 79–97)
Monocytes Absolute: 0.4 10*3/uL (ref 0.1–0.9)
Monocytes: 8 %
Neutrophils Absolute: 2.9 10*3/uL (ref 1.4–7.0)
Neutrophils: 59 %
Platelets: 266 10*3/uL (ref 150–450)
RBC: 4.94 x10E6/uL (ref 3.77–5.28)
RDW: 12.5 % (ref 11.7–15.4)
WBC: 4.8 10*3/uL (ref 3.4–10.8)

## 2023-06-08 LAB — LIPID PANEL
Cholesterol, Total: 195 mg/dL (ref 100–199)
HDL: 68 mg/dL (ref >39)
LDL CALC COMMENT:: 2.9 ratio (ref 0.0–4.4)
LDL Chol Calc (NIH): 110 mg/dL — ABNORMAL HIGH (ref 0–99)
Triglycerides: 95 mg/dL (ref 0–149)
VLDL Cholesterol Cal: 17 mg/dL (ref 5–40)

## 2023-06-08 NOTE — Telephone Encounter (Signed)
Referral to Ortho placed. Rheumatologists does more with RA.

## 2023-06-21 ENCOUNTER — Encounter: Payer: Self-pay | Admitting: Orthopaedic Surgery

## 2023-06-21 ENCOUNTER — Ambulatory Visit (INDEPENDENT_AMBULATORY_CARE_PROVIDER_SITE_OTHER): Payer: 59 | Admitting: Orthopaedic Surgery

## 2023-06-21 VITALS — Ht 65.0 in | Wt 155.0 lb

## 2023-06-21 DIAGNOSIS — S32000S Wedge compression fracture of unspecified lumbar vertebra, sequela: Secondary | ICD-10-CM | POA: Insufficient documentation

## 2023-06-21 DIAGNOSIS — M810 Age-related osteoporosis without current pathological fracture: Secondary | ICD-10-CM

## 2023-06-21 NOTE — Progress Notes (Signed)
 Office Visit Note   Patient: Kathryn Arnold           Date of Birth: 03/18/1946           MRN: [id number] Visit Date: 06/21/2023              Requested by: Junie Spencer, FNP [address] [place],  [place] [address] PCP: Junie Spencer, FNP   Assessment & Plan: Visit Diagnoses:  1. Osteoporosis, unspecified osteoporosis type, unspecified pathological fracture presence   2. Lumbar compression fracture, sequela     Plan: We discussed her diagnosis superior endplate fracture L4 with osteoporosis likely related to her coughing spell when she had RSV.  She will avoid bending and lifting.  Return 4 weeks with single lateral lumbar x-ray on return.  Continue calcium vitamin D and Fosamax.  She should get a repeat bone density test since the previous wellness done March 2023 once she is up walking more back to normal activity and it has been 2 years since her previous bone density test.  Discussed currently with her being sick with RSV and not walking much her bone density could artificially look worse than it should.  Follow-Up Instructions: Return in about 4 weeks (around 07/19/2023).   Orders:  No orders of the defined types were placed in this encounter.  No orders of the defined types were placed in this encounter.     Procedures: No procedures performed   Clinical Data: No additional findings.   Subjective: Chief Complaint  Patient presents with   Lower Back - Pain    HPI 78 year old female with osteoporosis diagnosed bone density test 2023 on Fosamax vitamin D and calcium.  She had RSV in January which was severe.  She had several severe coughing spells and then developed acute sudden back pain at the belt line and could not get out of bed for couple days.  X-rays on PACS shows the superior endplate fracture of L4.  She states in the last week of the back symptoms has improved she is avoiding bending and lifting.  She is use just Tylenol.  She is on Xarelto for  atrial fibrillation.  She avoids anti-inflammatories due to risk of bleeding with Xarelto.  Patient has atrial fibrillation hyperlipidemia hypertension.  Review of Systems all systems noncontributory to HPI.   Objective: Vital Signs: Ht 5\' 5"  (1.651 m)   Wt 155 lb (70.3 kg)   BMI 25.79 kg/m   Physical Exam Constitutional:      Appearance: She is well-developed.  HENT:     Head: Normocephalic.     Right Ear: External ear normal.     Left Ear: External ear normal. There is no impacted cerumen.  Eyes:     Pupils: Pupils are equal, round, and reactive to light.  Neck:     Thyroid: No thyromegaly.     Trachea: No tracheal deviation.  Cardiovascular:     Rate and Rhythm: Normal rate.  Pulmonary:     Effort: Pulmonary effort is normal.  Abdominal:     Palpations: Abdomen is soft.  Musculoskeletal:     Cervical back: No rigidity.  Skin:    General: Skin is warm and dry.  Neurological:     Mental Status: She is alert and oriented to person, place, and time.  Psychiatric:        Behavior: Behavior normal.     Ortho Exam sensation lower extremities are intact.  Negative logroll hips.  She  ambulates gets from sitting to standing moderate speed walks without discomfort or limp.  Specialty Comments:  No specialty comments available.  Imaging: Narrative & Impression  CLINICAL DATA:  Low back pain.  No injury.   EXAM: LUMBAR SPINE - 2-3 VIEW   COMPARISON:  None Available.   FINDINGS: Mild which shaped compression deformity of L4. There is depression of the central endplate consistent with a Schmorl's node. All remaining lumbar vertebra normal in stature. No bone lesions. Normal vertebral body alignment. Well maintained disc spaces. Soft tissues are unremarkable.   IMPRESSION: 1. Mild compression deformity of L4 consistent a previous fracture. The chronicity of this is unclear. 2. No other abnormality.     Electronically Signed   By: Amie Portland M.D.   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   Vitamin D deficiency 12/18/2013   Hyperlipidemia with target LDL less than 100 08/28/2012   Osteoporosis 08/28/2012   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   HSV-2 (herpes simplex virus 2) infection    Hypercholesteremia    Hyperlipidemia with target LDL less than 100    Hypertension    Osteoporosis, unspecified    Last DEXA 10/2011    Paroxysmal atrial fibrillation (HCC)    s/p ablation Afib and flutter 11/03/14   PONV (postoperative nausea and vomiting)    Vitamin D deficiency     Family History  Problem Relation Age of Onset   Osteoporosis Mother    Cancer Mother        breast   Hypertension Mother    Heart disease Father    Other Neg Hx     Past Surgical History:  Procedure Laterality Date   ATRIAL FIBRILLATION ABLATION N/A 10/31/2016   Procedure: Atrial Fibrillation Ablation;  Surgeon: Hillis Range, MD;  Location: [facility] INVASIVE CV LAB;  Service: Cardiovascular;  Laterality: N/A;   BREAST LUMPECTOMY Left    DILATION AND CURETTAGE OF UTERUS     ELECTROPHYSIOLOGIC STUDY N/A 11/03/2014   Procedure: Atrial Fibrillation Ablation;  Surgeon: Hillis Range, MD;  Location: [facility] INVASIVE CV LAB;  Service: Cardiovascular;  Laterality: N/A;   implantable loop recorder placement  03/08/2018   [employer] Reveal LINQ implantation by Dr  Johney Frame for afib management post ablation   LAPAROSCOPY ABDOMEN DIAGNOSTIC  1990s   LOOP RECORDER REMOVAL  09/17/2019   MDT Reveal LINQ removed in office by Dr Johney Frame   TEE WITHOUT CARDIOVERSION N/A 10/30/2016   Procedure: TRANSESOPHAGEAL ECHOCARDIOGRAM (TEE);  Surgeon: Thurmon Fair, MD;  Location: [facility] ENDOSCOPY;  Service: Cardiovascular;  Laterality: N/A;   TUBAL LIGATION  1978   Social History   Occupational History   Not on file  Tobacco Use   Smoking status: Never   Smokeless tobacco: Never  Vaping Use   Vaping status: Never Used  Substance and Sexual Activity  Alcohol use: No   Drug use: No   Sexual activity: Not Currently    Birth control/protection: None

## 2023-07-09 ENCOUNTER — Telehealth: Payer: Self-pay

## 2023-07-09 NOTE — Telephone Encounter (Signed)
 Started in 2014 helps protect bone patient aware she is to stay on

## 2023-07-09 NOTE — Telephone Encounter (Signed)
 Copied from CRM [id number]. Topic: Clinical - Medication Question >> Jul 09, 2023  9:01 AM Antwanette L wrote: Reason for CRM: The patient wants to know when she was first prescribed alendronate(FOSAMAX) and does she need to continue to take the medication for the rest of her life? The patient is requesting a callback at [phone].

## 2023-07-18 ENCOUNTER — Telehealth: Payer: Self-pay | Admitting: Family

## 2023-07-18 ENCOUNTER — Other Ambulatory Visit (INDEPENDENT_AMBULATORY_CARE_PROVIDER_SITE_OTHER): Payer: Self-pay

## 2023-07-18 ENCOUNTER — Ambulatory Visit: Admitting: Orthopaedic Surgery

## 2023-07-18 ENCOUNTER — Telehealth: Payer: Self-pay | Admitting: Radiology

## 2023-07-18 ENCOUNTER — Encounter: Payer: Self-pay | Admitting: Orthopaedic Surgery

## 2023-07-18 DIAGNOSIS — S32000S Wedge compression fracture of unspecified lumbar vertebra, sequela: Secondary | ICD-10-CM

## 2023-07-18 DIAGNOSIS — M810 Age-related osteoporosis without current pathological fracture: Secondary | ICD-10-CM

## 2023-07-18 NOTE — Telephone Encounter (Signed)
 Patient scheduled for 07/19/2023. Will wait on referral until after results come back

## 2023-07-18 NOTE — Addendum Note (Signed)
 Addended by: Rogers Seeds on: 07/18/2023 11:03 AM   Modules accepted: Orders

## 2023-07-18 NOTE — Telephone Encounter (Signed)
 Copied from CRM [id number]. Topic: Clinical - Request for Lab/Test Order >> Jul 18, 2023 12:01 PM Kathryn Arnold wrote: Reason for CRM: PT called in because she went to see ortho specialist and was told to ask primary to order a bone density test.    Ortho is also requesting pt see a osteoporosis.  Pt is requesting someone reach out to you.   Pt callback [phone]

## 2023-07-18 NOTE — Progress Notes (Signed)
 Office Visit Note   Patient: Kathryn Arnold           Date of Birth: 10-Oct-1945           MRN: [id number] Visit Date: 07/18/2023              Requested by: Junie Spencer, FNP [address] [place],  [place] [address] PCP: Junie Spencer, FNP   Assessment & Plan: Visit Diagnoses:  1. Lumbar compression fracture, sequela   2. Osteoporosis, unspecified osteoporosis type, unspecified pathological fracture presence     Plan: Patient's L4 fracture is stable.  She needs to stop her Fosamax and she has been out for several years and never has had a 72-month time period off of it to allow for bone remodeling.  I discussed with her at length that bone biopsy study shows that you are better if you cycled the Fosamax for 18 months and then stop it for 6 months.  Have her bone density test has not shown any improvement then she likely should be switched to an osteoporosis clinic.  Discussed with her that we have a PA in the [place] office running in osteoporosis clinic and she might need to be on a different medication to help build bone rather than biphosphonate.  She has had atrial fibrillation and we discussed the 2 times increase risk of atrial fibrillation associated with Fosamax.  She is taking calcium she is taking vitamin D and needs to continue due to a walking program.  Last bone density test was 2 years ago and will put her in for a new one.  I discussed with her that I am retiring and she will have to see follow-up treatment elsewhere.  She does not need additional x-rays for L4 fracture which is healed successfully.  Follow-Up Instructions: No follow-ups on file.   Orders:  Orders Placed This Encounter  Procedures   XR Lumbar Spine 2-3 Views   No orders of the defined types were placed in this encounter.     Procedures: No procedures performed   Clinical Data: No additional findings.   Subjective: Chief Complaint  Patient presents with   Lower Back - Follow-up,  Fracture    HPI 78-year-old female returns for osteoporosis follow-up with the L4 compression fracture.  X-rays taken today compared to images 2 months ago shows no further compression.  She states if she is busy does too much activities at home she is has some increased symptoms other days if she is not as busy she does not have any pain.  Fracture with superior endplate at L4 with central depression.  Review of Systems updated unchanged.  Of note is history of A-fib.   Objective: Vital Signs: There were no vitals taken for this visit.  Physical Exam Constitutional:      Appearance: She is well-developed.  HENT:     Head: Normocephalic.     Right Ear: External ear normal.     Left Ear: External ear normal. There is no impacted cerumen.  Eyes:     Pupils: Pupils are equal, round, and reactive to light.  Neck:     Thyroid: No thyromegaly.     Trachea: No tracheal deviation.  Cardiovascular:     Rate and Rhythm: Normal rate.  Pulmonary:     Effort: Pulmonary effort is normal.  Abdominal:     Palpations: Abdomen is soft.  Musculoskeletal:     Cervical back: No rigidity.  Skin:  General: Skin is warm and dry.  Neurological:     Mental Status: She is alert and oriented to person, place, and time.  Psychiatric:        Behavior: Behavior normal.     Ortho Exam no isolated motor weakness.  Normal heel toe gait.  Normal sensory.    Specialty Comments:  No specialty comments available.  Imaging: No results found.   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   Vitamin D  deficiency 12/18/2013   Hyperlipidemia with target LDL less than 100 08/28/2012   Osteoporosis 08/28/2012   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   HSV-2 (herpes simplex virus 2) infection    Hypercholesteremia    Hyperlipidemia with target LDL less than 100    Hypertension    Osteoporosis, unspecified    Last DEXA 10/2011    Paroxysmal atrial fibrillation (HCC)    s/p ablation Afib and flutter 11/03/14   PONV (postoperative nausea and vomiting)    Vitamin D deficiency     Family History  Problem Relation Age of Onset   Osteoporosis Mother    Cancer Mother        breast   Hypertension Mother    Heart disease Father    Other Neg Hx     Past Surgical History:  Procedure Laterality Date   ATRIAL FIBRILLATION ABLATION N/A 10/31/2016   Procedure: Atrial Fibrillation Ablation;  Surgeon: Hillis Range, MD;  Location: [facility] INVASIVE CV LAB;  Service: Cardiovascular;  Laterality: N/A;   BREAST LUMPECTOMY Left    DILATION AND CURETTAGE OF UTERUS     ELECTROPHYSIOLOGIC STUDY N/A 11/03/2014   Procedure: Atrial Fibrillation Ablation;  Surgeon: Hillis Range, MD;  Location: [facility] INVASIVE CV LAB;  Service: Cardiovascular;  Laterality: N/A;   implantable loop recorder placement  03/08/2018   Medtronic Reveal LINQ implantation by Dr Johney Frame for afib management post ablation   LAPAROSCOPY ABDOMEN DIAGNOSTIC  1990s   LOOP RECORDER REMOVAL  09/17/2019   MDT Reveal LINQ removed in office by Dr Johney Frame   TEE WITHOUT CARDIOVERSION N/A 10/30/2016   Procedure: TRANSESOPHAGEAL ECHOCARDIOGRAM (TEE);  Surgeon: Thurmon Fair, MD;  Location: [facility] ENDOSCOPY;  Service: Cardiovascular;  Laterality: N/A;   TUBAL LIGATION  1978   Social History   Occupational History   Not on file  Tobacco Use   Smoking status: Never   Smokeless tobacco:  Never  Vaping Use   Vaping status: Never Used  Substance and Sexual Activity   Alcohol use: No   Drug use: No   Sexual activity: Not Currently    Birth control/protection: None

## 2023-07-18 NOTE — Telephone Encounter (Signed)
 Dr. Ophelia Charter requests repeat bone density scan.  Patient would like for this to be done at [facility] where she had her last one performed. Kathryn Arnold sent message to Leda Min in that office to be scheduled.

## 2023-07-19 ENCOUNTER — Ambulatory Visit (INDEPENDENT_AMBULATORY_CARE_PROVIDER_SITE_OTHER): Payer: Self-pay

## 2023-07-19 ENCOUNTER — Other Ambulatory Visit: Payer: Self-pay | Admitting: Family

## 2023-07-19 DIAGNOSIS — Z780 Asymptomatic menopausal state: Secondary | ICD-10-CM

## 2023-07-20 ENCOUNTER — Telehealth: Payer: Self-pay

## 2023-07-20 NOTE — Telephone Encounter (Signed)
 Na

## 2023-07-24 ENCOUNTER — Other Ambulatory Visit: Payer: Self-pay | Admitting: Family Medicine

## 2023-07-24 DIAGNOSIS — M810 Age-related osteoporosis without current pathological fracture: Secondary | ICD-10-CM

## 2023-07-31 ENCOUNTER — Encounter: Admitting: Physician Assistant

## 2023-08-13 ENCOUNTER — Telehealth: Payer: Self-pay

## 2023-08-13 ENCOUNTER — Encounter: Payer: Self-pay | Admitting: Physician Assistant

## 2023-08-13 ENCOUNTER — Other Ambulatory Visit: Payer: Self-pay | Admitting: Physician Assistant

## 2023-08-13 ENCOUNTER — Ambulatory Visit: Admitting: Physician Assistant

## 2023-08-13 DIAGNOSIS — M8000XA Age-related osteoporosis with current pathological fracture, unspecified site, initial encounter for fracture: Secondary | ICD-10-CM | POA: Diagnosis not present

## 2023-08-13 NOTE — Telephone Encounter (Signed)
 Submitted for Prolia

## 2023-08-13 NOTE — Progress Notes (Signed)
 Office Visit Note   Patient: Kathryn Arnold           Date of Birth: March 20, 1946           MRN: [id number] Visit Date: 08/13/2023              Requested by: Yevette Hem, FNP [address] [place],  [place] [address] PCP: Yevette Hem, FNP   Assessment & Plan: Visit Diagnoses:  1. Osteoporosis with current pathological fracture, unspecified osteoporosis type, initial encounter     Plan: Patient is a pleasant 78 year old woman who comes in in referral for osteoporosis.  She is recovering from an L4 compression fracture.  She was followed by Dr. Murrel Arnt.  She has a history of A-fib and has had 2 ablations and is currently on anticoagulation.  She has been on Fosamax  until a month ago for several years.  Was not sure this might be a contributing factor to her A-fib.  She has never had any heart disease or stroke.  She has no history of cancer no history of kidney disease she does not have any history of ulcers.  She does not have severe reflux and no history of epilepsy.  She went through menopause at age 49.  She is taking 2000 international units of vitamin D  and 4000 international units of calcium  in.  She has not had hormone replacement therapy she is not a smoker she is not a drinker.  She does walk and is trying to incorporate some weight training.  From a lifestyle perspective I think she is doing very well.  We would need to draw a vitamin D  as she has not had this done in a while.  Her other labs have been recently drawn and are normal.  I talked her about Evenity and Prolia.  She does have an interest in starting Prolia which may be a good option for her her FRAX score is 25 for major osteoporotic fracture and 7.7 for hip fracture.  She is wondering who could follow her up for her back I will refer her to Elvan Hamel.  Will authorize for Prolia 45 minutes was spent reviewing her chart current medications and discussions and answering question   Follow-Up Instructions: Return if  symptoms worsen or fail to improve.   Orders:  Orders Placed This Encounter  Procedures   Vitamin D  (25 hydroxy)   No orders of the defined types were placed in this encounter.     Procedures: No procedures performed   Clinical Data: No additional findings.   Subjective: No chief complaint on file.   HPI patient is a pleasant active independent living 78 year old woman.  She had a recent L4 compression fracture.  She has not had any other fractures but was referred for osteoporosis evaluation.  She did have a bone density done 2 years ago it was -2.5 T-score just had one recently with similar score.  Review of Systems  All other systems reviewed and are negative.    Objective: Vital Signs: There were no vitals taken for this visit.  Physical Exam Constitutional:      Appearance: Normal appearance.  Pulmonary:     Effort: Pulmonary effort is normal.  Skin:    General: Skin is warm and dry.  Neurological:     General: No focal deficit present.     Mental Status: She is alert and oriented to person, place, and time.      Specialty Comments:  No  specialty comments available.  Imaging: No results found.   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   Vitamin D  deficiency 12/18/2013   Hyperlipidemia with target LDL less than 100 08/28/2012   Osteoporosis 08/28/2012   Past Medical History:  Diagnosis Date   Abnormal Pap smear    Arthritis    "fingers" (10/31/2016)   Complication of anesthesia    a little slow to wake up-stayed drowsy   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   HSV-2 (herpes simplex virus 2) infection    Hypercholesteremia    Hyperlipidemia with target LDL less than 100    Hypertension    Osteoporosis, unspecified    Last DEXA 10/2011    Paroxysmal atrial fibrillation (HCC)    s/p ablation Afib and flutter 11/03/14   PONV (postoperative nausea and vomiting)    Vitamin D  deficiency     Family History  Problem Relation Age of Onset   Osteoporosis Mother    Cancer Mother        breast   Hypertension Mother    Heart disease Father    Other Neg Hx     Past Surgical History:  Procedure Laterality Date   ATRIAL FIBRILLATION ABLATION N/A 10/31/2016   Procedure: Atrial Fibrillation Ablation;  Surgeon: Jolly Needle, MD;  Location: [facility] INVASIVE CV LAB;  Service: Cardiovascular;  Laterality: N/A;   BREAST LUMPECTOMY Left    DILATION AND CURETTAGE OF UTERUS     ELECTROPHYSIOLOGIC STUDY N/A 11/03/2014   Procedure: Atrial Fibrillation Ablation;  Surgeon: Jolly Needle, MD;  Location: [facility] INVASIVE CV LAB;  Service: Cardiovascular;  Laterality: N/A;   implantable loop recorder placement  03/08/2018   Medtronic Reveal LINQ implantation by Dr Nunzio Belch for afib management post ablation   LAPAROSCOPY ABDOMEN DIAGNOSTIC  1990s   LOOP RECORDER REMOVAL  09/17/2019   MDT Reveal LINQ removed in office by Dr Nunzio Belch   TEE WITHOUT CARDIOVERSION N/A 10/30/2016   Procedure: TRANSESOPHAGEAL ECHOCARDIOGRAM (TEE);  Surgeon: Luana Rumple, MD;  Location: [facility] ENDOSCOPY;  Service: Cardiovascular;  Laterality: N/A;   TUBAL LIGATION  1978   Social History   Occupational History   Not on file  Tobacco Use   Smoking status: Never   Smokeless tobacco: Never  Vaping Use   Vaping status: Never Used  Substance and Sexual Activity   Alcohol use: No   Drug use: No   Sexual activity: Not Currently    Birth control/protection: None

## 2023-08-14 LAB — EXTRA SPECIMEN

## 2023-08-14 LAB — HOUSE ACCOUNT TRACKING

## 2023-08-14 LAB — VITAMIN D 25 HYDROXY (VIT D DEFICIENCY, FRACTURES)

## 2023-08-21 ENCOUNTER — Encounter: Admitting: Physical Medicine and Rehabilitation

## 2023-08-22 ENCOUNTER — Telehealth: Payer: Self-pay | Admitting: Physician Assistant

## 2023-08-22 ENCOUNTER — Ambulatory Visit (INDEPENDENT_AMBULATORY_CARE_PROVIDER_SITE_OTHER): Admitting: Physical Medicine and Rehabilitation

## 2023-08-22 ENCOUNTER — Encounter: Payer: Self-pay | Admitting: Physical Medicine and Rehabilitation

## 2023-08-22 DIAGNOSIS — G8929 Other chronic pain: Secondary | ICD-10-CM

## 2023-08-22 DIAGNOSIS — M545 Low back pain, unspecified: Secondary | ICD-10-CM | POA: Diagnosis not present

## 2023-08-22 DIAGNOSIS — M7918 Myalgia, other site: Secondary | ICD-10-CM

## 2023-08-22 DIAGNOSIS — S32000S Wedge compression fracture of unspecified lumbar vertebra, sequela: Secondary | ICD-10-CM | POA: Diagnosis not present

## 2023-08-22 DIAGNOSIS — M810 Age-related osteoporosis without current pathological fracture: Secondary | ICD-10-CM

## 2023-08-22 DIAGNOSIS — M542 Cervicalgia: Secondary | ICD-10-CM

## 2023-08-22 NOTE — Progress Notes (Signed)
 Kathryn Arnold - 78 y.o. female MRN [id number]  Date of birth: 1945-05-28  Office Visit Note: Visit Date: 08/22/2023 PCP: Yevette Hem, FNP Referred by: Yevette Hem, FNP  Subjective: Chief Complaint  Patient presents with   Lower Back - Pain   Neck - Pain   HPI: Kathryn Arnold is a 78 y.o. female who comes in today as a self referral for evaluation of chronic intermittent right sided lower back pain. She is previous patient of Dr. Murrel Arnt and has also been evaluated by my colleague Norma Beckers Persons, PA for more osteoporosis management. Pain started after being diagnosed with RSV in January, reports chronic cough for several months. She was found to have L4 compression fracture. Her pain worsens with coughing, sneezing and bending over. She describes pain as sharp sensation, currently denies pain at this time. Her pain has improved gradually since onset in January. Some relief of pain with home exercise regimen, rest and use of medications. Lumbar radiographs from January of this year show mild compression deformity of L4 consistent a previous fracture. The chronicity of this is unclear. No focal weakness, numbness and tingling. No recent trauma or falls.   She also reports bilateral neck pain. Pain ongoing intermittently for several months. No specific aggravating factors. She describes pain as sore and throbbing sensation, currently denies pain. Some relief of pain with home exercise regimen, rest and use of medications. Cervical radiographs from 2023 shows normal anatomical alignment, well preserved disc spacing, no spondylolisthesis.          Review of Systems  Musculoskeletal:  Positive for back pain, myalgias and neck pain.  Neurological:  Negative for tingling, sensory change, focal weakness and weakness.  All other systems reviewed and are negative.  Otherwise per HPI.  Assessment & Plan: Visit Diagnoses:    ICD-10-CM   1. Chronic right-sided low back pain without  sciatica  M54.50 Ambulatory referral to Physical Therapy   G89.29     2. Lumbar compression fracture, sequela  S32.000S Ambulatory referral to Physical Therapy    3. Osteoporosis, unspecified osteoporosis type, unspecified pathological fracture presence  M81.0 Ambulatory referral to Physical Therapy    4. Cervicalgia  M54.2 Ambulatory referral to Physical Therapy    5. Myofascial pain syndrome  M79.18 Ambulatory referral to Physical Therapy       Plan: Findings:  1. Chronic right sided lower back pain. Overall, her pain has improved since onset in January. She continues to have pain with coughing, sneezing and bending. Her pain seems to be more intermittent. Most days her pain is not severe enough to take over the counter Tylenol .   I explained to her that treatment of lumbar compression fractures involves therapy, bracing, medications and time. There is also option for surgery but I do not feel she would be ideal candidate as she has responded well to conservative therapies. I would recommend short course of physical therapy for gentle strengthening exercises. She can take over the counter Tylenol  as needed.   2. Chronic intermittent bilateral neck pain. Her pain does seem to be more myofascial related. She has good range of motion of neck today. No pain radiating down the arms. I also placed order for PT to address neck pain. Should her pain become more radicular in nature would consider obtaining cervical MRI imaging. No red flag symptoms noted upon exam today.  Would recommend she follow up with Norma Beckers Persons, PA when she decides to continue with osteoporosis  treatment.     Meds & Orders: No orders of the defined types were placed in this encounter.   Orders Placed This Encounter  Procedures   Ambulatory referral to Physical Therapy    Follow-up: Return if symptoms worsen or fail to improve.   Procedures: No procedures performed      Clinical History: Narrative &  Impression CLINICAL DATA:  Low back pain.  No injury.   EXAM: LUMBAR SPINE - 2-3 VIEW   COMPARISON:  None Available.   FINDINGS: Mild which shaped compression deformity of L4. There is depression of the central endplate consistent with a Schmorl's node. All remaining lumbar vertebra normal in stature. No bone lesions. Normal vertebral body alignment. Well maintained disc spaces. Soft tissues are unremarkable.   IMPRESSION: 1. Mild compression deformity of L4 consistent a previous fracture. The chronicity of this is unclear. 2. No other abnormality.     Electronically Signed   By: Amanda Jungling M.D.   On: 05/17/2023 15:26   She reports that she has never smoked. She has never used smokeless tobacco. No results for input(s): "HGBA1C", "LABURIC" in the last 8760 hours.  Objective:  VS:  HT:    WT:   BMI:     BP:   HR: bpm  TEMP: ( )  RESP:  Physical Exam Vitals and nursing note reviewed.  HENT:     Head: Normocephalic and atraumatic.     Right Ear: External ear normal.     Left Ear: External ear normal.     Nose: Nose normal.     Mouth/Throat:     Mouth: Mucous membranes are moist.  Eyes:     Extraocular Movements: Extraocular movements intact.  Cardiovascular:     Rate and Rhythm: Normal rate.     Pulses: Normal pulses.  Pulmonary:     Effort: Pulmonary effort is normal.  Abdominal:     General: Abdomen is flat. There is no distension.  Musculoskeletal:        General: Tenderness present.     Cervical back: Tenderness present.     Comments: Patient rises from seated position to standing without difficulty. Good lumbar range of motion. No pain noted with facet loading. 5/5 strength noted with bilateral hip flexion, knee flexion/extension, ankle dorsiflexion/plantarflexion and EHL. No clonus noted bilaterally. No pain upon palpation of greater trochanters. No pain with internal/external rotation of bilateral hips. Sensation intact bilaterally. Negative slump test  bilaterally. Ambulates without aid, gait steady.   No discomfort noted with flexion, extension and side-to-side rotation. Patient has good strength in the upper extremities including 5 out of 5 strength in wrist extension, long finger flexion and APB. Shoulder range of motion is full bilaterally without any sign of impingement. There is no atrophy of the hands intrinsically. Sensation intact bilaterally. Myofascial tenderness noted to bilateral cervical paraspinal regions upon palpation. Negative Hoffman's sign. Negative Spurling's sign.       Skin:    General: Skin is warm and dry.     Capillary Refill: Capillary refill takes less than 2 seconds.  Neurological:     General: No focal deficit present.     Mental Status: She is alert and oriented to person, place, and time.  Psychiatric:        Mood and Affect: Mood normal.        Behavior: Behavior normal.     Ortho Exam  Imaging: No results found.  Past Medical/Family/Surgical/Social History: Medications & Allergies reviewed per EMR, new  medications updated. 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   Vitamin D  deficiency 12/18/2013   Hyperlipidemia with target LDL less than 100 08/28/2012   Osteoporosis 08/28/2012   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   HSV-2 (herpes simplex virus 2) infection    Hypercholesteremia    Hyperlipidemia with target LDL less than 100     Hypertension    Osteoporosis, unspecified    Last DEXA 10/2011    Paroxysmal atrial fibrillation (HCC)    s/p ablation Afib and flutter 11/03/14   PONV (postoperative nausea and vomiting)    Vitamin D  deficiency    Family History  Problem Relation Age of Onset   Osteoporosis Mother    Cancer Mother        breast   Hypertension Mother    Heart disease Father    Other Neg Hx    Past Surgical History:  Procedure Laterality Date   ATRIAL FIBRILLATION ABLATION N/A 10/31/2016   Procedure: Atrial Fibrillation Ablation;  Surgeon: Jolly Needle, MD;  Location: [facility] INVASIVE CV LAB;  Service: Cardiovascular;  Laterality: N/A;   BREAST LUMPECTOMY Left    DILATION AND CURETTAGE OF UTERUS     ELECTROPHYSIOLOGIC STUDY N/A 11/03/2014   Procedure: Atrial Fibrillation Ablation;  Surgeon: Jolly Needle, MD;  Location: [facility] INVASIVE CV LAB;  Service: Cardiovascular;  Laterality: N/A;   implantable loop recorder placement  03/08/2018   [employer]tronic Reveal LINQ implantation by Dr Nunzio Belch for afib management post ablation   LAPAROSCOPY ABDOMEN DIAGNOSTIC  1990s   LOOP RECORDER REMOVAL  09/17/2019   MDT Reveal LINQ removed in office by Dr Nunzio Belch   TEE WITHOUT CARDIOVERSION N/A 10/30/2016   Procedure: TRANSESOPHAGEAL ECHOCARDIOGRAM (TEE);  Surgeon: Luana Rumple, MD;  Location: [facility] ENDOSCOPY;  Service: Cardiovascular;  Laterality: N/A;   TUBAL LIGATION  1978   Social History   Occupational History   Not on file  Tobacco Use   Smoking status: Never   Smokeless tobacco: Never  Vaping Use   Vaping status: Never Used  Substance and Sexual Activity   Alcohol use: No   Drug use: No   Sexual activity: Not Currently    Birth control/protection: None

## 2023-08-22 NOTE — Progress Notes (Signed)
 Pain Scale   Average Pain 7 Patient advising she has pain in lower back and now some discomfort in her neck area. Patient states she had a fracture back in January and Dr. Murrel Arnt advised in March it was healing but she feels it is not healing .        +Driver, -BT, -Dye Allergies.

## 2023-08-22 NOTE — Telephone Encounter (Signed)
 Pt came down after seeing Megan to see is she could get her VIT-D results stating that Jacqlyn Matas told her that the labs were cancelled.  She was wanting a call to see if she could get the results.  She was wanting to see if she needs to go to her PCP to get the labs done.

## 2023-08-28 ENCOUNTER — Telehealth: Payer: Self-pay | Admitting: Physical Medicine and Rehabilitation

## 2023-08-28 NOTE — Telephone Encounter (Signed)
 Pt is requesting a phone call in regards to the next plan of care. Pt had labs done and have not received any results. Nor has it been addressed at her last 2 appointments.

## 2023-09-03 ENCOUNTER — Ambulatory Visit: Admitting: Physical Therapy

## 2023-09-07 ENCOUNTER — Other Ambulatory Visit: Payer: Self-pay | Admitting: *Deleted

## 2023-09-07 DIAGNOSIS — M810 Age-related osteoporosis without current pathological fracture: Secondary | ICD-10-CM

## 2023-09-07 MED ORDER — ALENDRONATE SODIUM 70 MG PO TABS: ORAL_TABLET | ORAL | 0 refills | Status: DC

## 2023-09-14 ENCOUNTER — Emergency Department (HOSPITAL_COMMUNITY)

## 2023-09-14 ENCOUNTER — Emergency Department (HOSPITAL_COMMUNITY)
Admission: EM | Admit: 2023-09-14 | Discharge: 2023-09-14 | Disposition: A | Discharge status: Home / Self Care | Attending: Emergency Medicine | Admitting: Emergency Medicine

## 2023-09-14 ENCOUNTER — Encounter (HOSPITAL_COMMUNITY): Payer: Self-pay | Admitting: Emergency Medicine

## 2023-09-14 ENCOUNTER — Other Ambulatory Visit: Payer: Self-pay

## 2023-09-14 ENCOUNTER — Ambulatory Visit: Payer: Self-pay

## 2023-09-14 DIAGNOSIS — N3000 Acute cystitis without hematuria: Secondary | ICD-10-CM | POA: Diagnosis not present

## 2023-09-14 DIAGNOSIS — R531 Weakness: Secondary | ICD-10-CM | POA: Insufficient documentation

## 2023-09-14 DIAGNOSIS — R1084 Generalized abdominal pain: Secondary | ICD-10-CM | POA: Diagnosis present

## 2023-09-14 LAB — CBC WITH DIFFERENTIAL/PLATELET
Abs Immature Granulocytes: 0.01 10*3/uL (ref 0.00–0.07)
Basophils Absolute: 0.1 10*3/uL (ref 0.0–0.1)
Basophils Relative: 1 %
Eosinophils Absolute: 0 10*3/uL (ref 0.0–0.5)
Eosinophils Relative: 1 %
HCT: 41.1 % (ref 36.0–46.0)
Hemoglobin: 14.2 g/dL (ref 12.0–15.0)
Immature Granulocytes: 0 %
Lymphocytes Relative: 27 %
Lymphs Abs: 1.4 10*3/uL (ref 0.7–4.0)
MCH: 30.9 pg (ref 26.0–34.0)
MCHC: 34.5 g/dL (ref 30.0–36.0)
MCV: 89.5 fL (ref 80.0–100.0)
Monocytes Absolute: 0.5 10*3/uL (ref 0.1–1.0)
Monocytes Relative: 10 %
Neutro Abs: 3.2 10*3/uL (ref 1.7–7.7)
Neutrophils Relative %: 61 %
Platelets: 246 10*3/uL (ref 150–400)
RBC: 4.59 MIL/uL (ref 3.87–5.11)
RDW: 11.9 % (ref 11.5–15.5)
WBC: 5.1 10*3/uL (ref 4.0–10.5)
nRBC: 0 % (ref 0.0–0.2)

## 2023-09-14 LAB — URINALYSIS, ROUTINE W REFLEX MICROSCOPIC
Bacteria, UA: NONE SEEN
Bilirubin Urine: NEGATIVE
Glucose, UA: NEGATIVE mg/dL
Hgb urine dipstick: NEGATIVE
Ketones, ur: NEGATIVE mg/dL
Nitrite: NEGATIVE
Protein, ur: NEGATIVE mg/dL
Specific Gravity, Urine: 1.005 (ref 1.005–1.030)
pH: 7 (ref 5.0–8.0)

## 2023-09-14 LAB — COMPREHENSIVE METABOLIC PANEL WITH GFR
ALT: 20 U/L (ref 0–44)
AST: 21 U/L (ref 15–41)
Albumin: 3.8 g/dL (ref 3.5–5.0)
Alkaline Phosphatase: 43 U/L (ref 38–126)
Anion gap: 5 (ref 5–15)
BUN: 24 mg/dL — ABNORMAL HIGH (ref 8–23)
CO2: 23 mmol/L (ref 22–32)
Calcium: 8.9 mg/dL (ref 8.9–10.3)
Chloride: 107 mmol/L (ref 98–111)
Creatinine, Ser: 0.58 mg/dL (ref 0.44–1.00)
GFR, Estimated: >60 mL/min (ref >60)
Glucose, Bld: 100 mg/dL — ABNORMAL HIGH (ref 70–99)
Potassium: 3.5 mmol/L (ref 3.5–5.1)
Sodium: 135 mmol/L (ref 135–145)
Total Bilirubin: 0.5 mg/dL (ref 0.0–1.2)
Total Protein: 6.8 g/dL (ref 6.5–8.1)

## 2023-09-14 LAB — LIPASE, BLOOD: Lipase: 45 U/L (ref 11–51)

## 2023-09-14 MED ORDER — SODIUM CHLORIDE 0.9 % IV BOLUS
1000.0000 mL | Freq: Once | INTRAVENOUS | Status: AC
  Administered 2023-09-14: 1000 mL via INTRAVENOUS

## 2023-09-14 MED ORDER — IOHEXOL 300 MG/ML  SOLN
100.0000 mL | Freq: Once | INTRAMUSCULAR | Status: AC | PRN
  Administered 2023-09-14: 100 mL via INTRAVENOUS

## 2023-09-14 MED ORDER — DICYCLOMINE HCL 20 MG PO TABS
ORAL_TABLET | ORAL | 0 refills | Status: DC
Start: 2023-09-14 — End: 2023-12-10

## 2023-09-14 MED ORDER — CEPHALEXIN 500 MG PO CAPS: 500.0000 mg | ORAL_CAPSULE | Freq: Four times a day (QID) | ORAL | 0 refills | Status: DC

## 2023-09-14 NOTE — ED Triage Notes (Signed)
 Pt ambulatory to triage. Pt endorses weakness and abdominal bloating that started a few months ago. Pt reports that today it has gotten progressively worse. Pt endorses dizziness and lightheadedness. Denies nausea and vomiting.  Last bm was this morning.   Hx afib

## 2023-09-14 NOTE — Telephone Encounter (Signed)
 Chief Complaint: weakness Symptoms: weakness, fatigue Frequency: 1 wk, worsening Pertinent Negatives: Patient denies fever, flu-like symptoms, one-sided weakness or numbness, CP, SOB, palpitations, vomiting, diarrhea, bloody stools, falls Disposition: [] ED /[x] Urgent Care (no appt availability in office) / [] Appointment(In office/virtual)/ []  [facility]/ [] Home Care/ [] Refused Recommended Disposition /[] [facility]/ []  Follow-up with PCP Additional Notes: Pt reports intermittent fatigue and weakness for the last week with recent worsening. Pt states she made dough this AM and that kneading it was difficult d/t weakness and fatigue. Pt is still able to walk normally. Pt states she is eating well and drinking plenty of water. Pt denies fever, flu-like symptoms, CP, SOB, palpitations, vomiting, diarrhea, bloody stools, falls. While pt denies diarrhea, she states she has had more frequent bowel movements today than is typical for her. Pt endorses 6 bowel movements since this AM. RN advised pt she should be seen within 24 hours. No availability in the office during that time frame. RN advised pt be seen at an UC, pt agreeable to do that today and her husband will take her. RN advised the pt if she gets any worse or develops new symptoms, she needs to go to the ED and call 911. Pt verbalized understanding.    Copied from CRM [id number]. Topic: Clinical - Red Word Triage >> Sep 14, 2023 10:52 AM Kathryn Arnold T wrote: Red Word that prompted transfer to Nurse Triage: patient said she has not been feeling good. She is tired and weak Reason for Disposition  [1] MODERATE weakness (i.e., interferes with work, school, normal activities) AND [2] persists > 3 days  Answer Assessment - Initial Assessment Questions 1. DESCRIPTION: "Describe how you are feeling."     "Tired, I can't get anything done", "I feel weak" 2. SEVERITY: "How bad is it?"  "Can you stand and walk?"   - MILD (0-3): Feels weak  or tired, but does not interfere with work, school or normal activities.   - MODERATE (4-7): Able to stand and walk; weakness interferes with work, school, or normal activities.   - SEVERE (8-10): Unable to stand or walk; unable to do usual activities.     Moderate -- able to stand and walk but states she "can't get anything done" 3. ONSET: "When did these symptoms begin?" (e.g., hours, days, weeks, months)     Week  4. CAUSE: "What do you think is causing the weakness or fatigue?" (e.g., not drinking enough fluids, medical problem, trouble sleeping)     Pt states he is eating and drinking enough. Pt states he drinks a lot of water. Endorses difficulty sleeping -- "I'll sleep for a while but get up 2-3 times in the night, I function of 6-7 hours of sleep" 5. NEW MEDICINES:  "Have you started on any new medicines recently?" (e.g., opioid pain medicines, benzodiazepines, muscle relaxants, antidepressants, antihistamines, neuroleptics, beta blockers)     No  6. OTHER SYMPTOMS: "Do you have any other symptoms?" (e.g., chest pain, fever, cough, SOB, vomiting, diarrhea, bleeding, other areas of pain)     BP 140/74, HR 63, O2 95 today. Never been this bad before.   Notes increased bowel movements. Pt states normally she goes to bathroom a couple times a day but today has gone 6 times. Denies dark, tarry stools. "It looks kind of light."   Denies CP. Denies SOB. Denies vomiting. Denies abdominal pain. Denies pain anywhere else. Denies fever or chills. Denies flu-like symptoms. Denies one-sided weakness or one-sided numbness.  Protocols used: Weakness (Generalized) and Fatigue-A-AH

## 2023-09-14 NOTE — Discharge Instructions (Addendum)
 Drink plenty of fluids and follow-up with your family doctor next week to recheck your urine.  Also follow-up with the gastroenterologist in the next couple weeks for your abdominal bloating

## 2023-09-14 NOTE — ED Notes (Signed)
 Pt provided discharge instructions and prescription information. Pt was given the opportunity to ask questions and questions were answered.

## 2023-09-14 NOTE — Telephone Encounter (Signed)
 UC. Noted. LS

## 2023-09-16 LAB — URINE CULTURE

## 2023-09-16 NOTE — ED Provider Notes (Signed)
 [facility]PART[facility] AT [facility] Provider Note   CSN: [id number] Arrival date & time: 09/14/23  1403     History  Chief Complaint  Patient presents with   Bloated   Abdominal Pain   Weakness    Kathryn Arnold is a 78 y.o. female.  Patient complains of abdominal bloating for months.  No vomiting no fevers no chills  The history is provided by the patient and medical records.  Abdominal Pain Pain location:  Generalized Pain quality: aching   Pain radiates to:  Does not radiate Pain severity:  Mild Onset quality:  Gradual Timing:  Intermittent Progression:  Waxing and waning Chronicity:  Recurrent Context: not alcohol use   Relieved by:  Nothing Worsened by:  Nothing Associated symptoms: no chest pain, no cough, no diarrhea, no fatigue and no hematuria   Weakness Associated symptoms: abdominal pain   Associated symptoms: no chest pain, no cough, no diarrhea, no frequency, no headaches and no seizures        Home Medications Prior to Admission medications   Medication Sig Start Date End Date Taking? Authorizing Provider  cephALEXin  (KEFLEX ) 500 MG capsule Take 1 capsule (500 mg total) by mouth 4 (four) times daily. 09/14/23  Yes Lawson Isabell, MD  dicyclomine (BENTYL) 20 MG tablet Take 1 every 8 hours as needed for abdominal cramping 09/14/23  Yes Decker Cogdell, MD  alendronate  (FOSAMAX ) 70 MG tablet TAKE 1 TABLET BY MOUTH ONCE A WEEK WITH A FULL GLASS OF WATER ON AN EMPTY STOMACH. DO NOT LIE DOWN FOR 30 MINUTES. REMAIN UPRIGHT 09/07/23   Tommas Fragmin A, FNP  Ascorbic Acid (VITAMIN C PO) Take one tablet by mouth daily    [provider]  Calcium  Citrate-Vitamin D  (CITRACAL + D PO) Take 500 mg by mouth 2 (two) times daily.    [provider]  diltiazem  (CARDIZEM  CD) 180 MG 24 hr capsule Take 1 capsule (180 mg total) by mouth daily. 06/05/23   Yevette Hem, FNP  diltiazem  (CARDIZEM ) 60 MG tablet Take 0.5-1 tablets (30-60 mg  total) by mouth every 6 (six) hours as needed (Heart rate over 100 as long as systolic blood pressure >100). 06/05/23   Yevette Hem, FNP  flecainide  (TAMBOCOR ) 50 MG tablet Take 1 tablet (50 mg total) by mouth 2 (two) times daily. 06/05/23   Tommas Fragmin A, FNP  glucosamine-chondroitin 500-400 MG tablet Take 2 tablets by mouth daily.    [provider]  Magnesium 250 MG TABS Take 1 tablet by mouth every evening.    [provider]  Multiple Vitamins-Minerals (CENTRUM SILVER PO) Take 1 tablet by mouth daily.    [provider]  predniSONE  (STERAPRED UNI-PAK 21 TAB) 10 MG (21) TBPK tablet Use as directed 06/05/23   Tommas Fragmin A, FNP  rivaroxaban  (XARELTO ) 20 MG TABS tablet Take 1 tablet (20 mg total) by mouth at bedtime. 06/05/23   Yevette Hem, FNP  rosuvastatin  (CRESTOR ) 20 MG tablet Take 1 tablet (20 mg total) by mouth at bedtime. 06/05/23   Tommas Fragmin A, FNP  valACYclovir  (VALTREX ) 500 MG tablet Take 1 tablet (500 mg total) by mouth daily. 06/05/23   Yevette Hem, FNP      Allergies    Patient has no known allergies.    Review of Systems   Review of Systems  Constitutional:  Negative for appetite change and fatigue.  HENT:  Negative for congestion, ear discharge and sinus pressure.  Eyes:  Negative for discharge.  Respiratory:  Negative for cough.   Cardiovascular:  Negative for chest pain.  Gastrointestinal:  Positive for abdominal pain. Negative for diarrhea.  Genitourinary:  Negative for frequency and hematuria.  Musculoskeletal:  Negative for back pain.  Skin:  Negative for rash.  Neurological:  Positive for weakness. Negative for seizures and headaches.  Psychiatric/Behavioral:  Negative for hallucinations.     Physical Exam Updated Vital Signs BP (!) 156/86   Pulse 61   Temp 98 F (36.7 C)   Resp 18   Ht 5\' 5"  (1.651 m)   Wt 65.8 kg   SpO2 97%   BMI 24.13 kg/m  Physical Exam Vitals and nursing note reviewed.   Constitutional:      Appearance: She is well-developed.  HENT:     Head: Normocephalic.     Nose: Nose normal.  Eyes:     General: No scleral icterus.    Conjunctiva/sclera: Conjunctivae normal.  Neck:     Thyroid : No thyromegaly.  Cardiovascular:     Rate and Rhythm: Normal rate and regular rhythm.     Heart sounds: No murmur heard.    No friction rub. No gallop.  Pulmonary:     Breath sounds: No stridor. No wheezing or rales.  Chest:     Chest wall: No tenderness.  Abdominal:     General: There is no distension.     Tenderness: There is no abdominal tenderness. There is no rebound.     Comments: Mild tenderness  Musculoskeletal:        General: Normal range of motion.     Cervical back: Neck supple.  Lymphadenopathy:     Cervical: No cervical adenopathy.  Skin:    Findings: No erythema or rash.  Neurological:     Mental Status: She is alert and oriented to person, place, and time.     Motor: No abnormal muscle tone.     Coordination: Coordination normal.  Psychiatric:        Behavior: Behavior normal.     ED Results / Procedures / Treatments   Labs (all labs ordered are listed, but only abnormal results are displayed) Labs Reviewed  URINE CULTURE - Abnormal; Notable for the following components:      Result Value   Culture MULTIPLE SPECIES PRESENT, SUGGEST RECOLLECTION (*)    All other components within normal limits  COMPREHENSIVE METABOLIC PANEL WITH GFR - Abnormal; Notable for the following components:   Glucose, Bld 100 (*)    BUN 24 (*)    All other components within normal limits  URINALYSIS, ROUTINE W REFLEX MICROSCOPIC - Abnormal; Notable for the following components:   Color, Urine STRAW (*)    Leukocytes,Ua MODERATE (*)    All other components within normal limits  CBC WITH DIFFERENTIAL/PLATELET  LIPASE, BLOOD    EKG EKG Interpretation Date/Time:  Friday Sep 14 2023 17:33:58 EDT Ventricular Rate:  73 PR Interval:  65 QRS Duration:  119 QT  Interval:  440 QTC Calculation: 485 R Axis:   -7  Text Interpretation: Sinus rhythm Short PR interval Nonspecific intraventricular conduction delay Minimal ST depression, lateral leads No significant change since prior 10/24 Confirmed by Racheal Buddle [phone]) on 09/15/2023 10:10:41 AM  Radiology CT ABDOMEN PELVIS W CONTRAST Result Date: 09/14/2023 CLINICAL DATA:  Acute nonlocalized abdominal pain, weakness, abdominal bloating EXAM: CT ABDOMEN AND PELVIS WITH CONTRAST TECHNIQUE: Multidetector CT imaging of the abdomen and pelvis was performed using the standard protocol following  bolus administration of intravenous contrast. RADIATION DOSE REDUCTION: This exam was performed according to the departmental dose-optimization program which includes automated exposure control, adjustment of the mA and/or kV according to patient size and/or use of iterative reconstruction technique. CONTRAST:  OMNIPAQUE  IOHEXOL  300 MG/ML  SOLN COMPARISON:  None Available. FINDINGS: Lower chest: No acute abnormality. Hepatobiliary: Unremarkable liver. Normal gallbladder. No biliary dilation. Pancreas: Unremarkable. Spleen: Unremarkable. Adrenals/Urinary Tract: Normal adrenal glands. Cortical renal scarring in both kidneys. Parenchymal calcifications right kidney. No urinary calculi or hydronephrosis. Bladder is unremarkable. Stomach/Bowel: Normal caliber large and small bowel. No bowel wall thickening. The appendix is normal.Stomach is within normal limits. Vascular/Lymphatic: Aortic atherosclerosis. No enlarged abdominal or pelvic lymph nodes. Reproductive: Unremarkable. Other: No free intraperitoneal fluid or air. Musculoskeletal: No acute fracture. Chronic compression fracture of L4. IMPRESSION: 1. No acute abnormality in the abdomen or pelvis. 2. Aortic Atherosclerosis (ICD10-I70.0). Electronically Signed   By: Rozell Cornet M.D.   On: 09/14/2023 19:38   DG Chest 2 View Result Date: 09/14/2023 CLINICAL DATA:  Weakness  and abdominal bloating. EXAM: CHEST - 2 VIEW COMPARISON:  05/03/2023. FINDINGS: Bilateral lung fields are clear. Bilateral costophrenic angles are clear. Normal cardio-mediastinal silhouette. No acute osseous abnormalities. The soft tissues are within normal limits. IMPRESSION: No active cardiopulmonary disease. Electronically Signed   By: Beula Brunswick M.D.   On: 09/14/2023 17:04    Procedures Procedures    Medications Ordered in ED Medications  sodium chloride  0.9 % bolus 1,000 mL (0 mLs Intravenous Stopped 09/14/23 2002)  iohexol  (OMNIPAQUE ) 300 MG/ML solution 100 mL (100 mLs Intravenous Contrast Given 09/14/23 1758)    ED Course/ Medical Decision Making/ A&P  Patient urine has white cells.  It will be cultured and she will empirically be treated with antibiotics.  Rest of her labs are unremarkable save for mild dehydration.  CT abdomen reviewed and unremarkable                               Medical Decision Making Amount and/or Complexity of Data Reviewed Labs: ordered. Radiology: ordered. ECG/medicine tests: ordered.  Risk Prescription drug management.   Patient with abdominal cramping.  She has mild dehydration and possible urinary tract infection.  Patiently discharged home with antibiotics and Bentyl and referred to GI and her family doctor        Final Clinical Impression(s) / ED Diagnoses Final diagnoses:  Weakness  Acute cystitis without hematuria    Rx / DC Orders ED Discharge Orders          Ordered    cephALEXin  (KEFLEX ) 500 MG capsule  4 times daily        09/14/23 1949    dicyclomine (BENTYL) 20 MG tablet        09/14/23 1950              Cheyenne Cotta, MD 09/16/23 1113

## 2023-09-26 ENCOUNTER — Encounter: Payer: Self-pay | Admitting: Family Medicine

## 2023-09-26 ENCOUNTER — Ambulatory Visit: Admitting: Family Medicine

## 2023-09-26 VITALS — BP 121/66 | HR 49 | Temp 97.4°F | Ht 65.0 in | Wt 158.2 lb

## 2023-09-26 DIAGNOSIS — R140 Abdominal distension (gaseous): Secondary | ICD-10-CM

## 2023-09-26 DIAGNOSIS — N3000 Acute cystitis without hematuria: Secondary | ICD-10-CM

## 2023-09-26 DIAGNOSIS — R001 Bradycardia, unspecified: Secondary | ICD-10-CM | POA: Diagnosis not present

## 2023-09-26 LAB — URINALYSIS, ROUTINE W REFLEX MICROSCOPIC
Bilirubin, UA: NEGATIVE
Glucose, UA: NEGATIVE
Ketones, UA: NEGATIVE
Nitrite, UA: NEGATIVE
Protein,UA: NEGATIVE
RBC, UA: NEGATIVE
Specific Gravity, UA: 1.015 (ref 1.005–1.030)
Urobilinogen, Ur: 0.2 mg/dL (ref 0.2–1.0)
pH, UA: 6.5 (ref 5.0–7.5)

## 2023-09-26 LAB — MICROSCOPIC EXAMINATION

## 2023-09-26 NOTE — Progress Notes (Signed)
 Acute Office Visit  Subjective:     Patient ID: Kathryn Arnold, female    DOB: 06/24/1945, 78 y.o.   MRN: [id number]  Chief Complaint  Patient presents with   Bloated    HPI Patient is in today for ER follow up. She was seen in the ER on 09/14/23 at [facility] for abdominal bloating and weakness. Weakness has resolved now. She reports that bloating has been ongoing for months. This is intermittent. Feels like it takes longer for her food to digest and feels like food sits in her stomach for longer. She eats small portions at a time because of this. She denies pain, dysphagia, fever, weight loss, vomiting, nausea, urinary symtpoms, constipation, diarrhea, melena, of hemacheztia.  She had a negative abdomen/pelvis CT. CBC/CMP were unremarkable. EKG was unchanged from previous. UA indicated possible UTI. She was discharged home with keflex  QID x7 days for UTI treatment. Culture with multiple species present. She was also discharged with bentyl  for has not tried this. She is not interested in a GI referral at this time.    ROS As per HPI.      Objective:    BP 121/66   Pulse (!) 49   Temp (!) 97.4 F (36.3 C) (Temporal)   Ht 5\' 5"  (1.651 m)   Wt 158 lb 3.2 oz (71.8 kg)   SpO2 95%   BMI 26.33 kg/m  Wt Readings from Last 3 Encounters:  09/26/23 158 lb 3.2 oz (71.8 kg)  09/14/23 145 lb (65.8 kg)  06/21/23 155 lb (70.3 kg)   Pulse Readings from Last 3 Encounters:  09/26/23 (!) 49  09/14/23 61  05/15/23 83     Physical Exam Vitals and nursing note reviewed.  Constitutional:      General: She is not in acute distress.    Appearance: Normal appearance. She is not ill-appearing, toxic-appearing or diaphoretic.  HENT:     Mouth/Throat:     Mouth: Mucous membranes are moist.     Pharynx: Oropharynx is clear.  Eyes:     General: No scleral icterus. Cardiovascular:     Rate and Rhythm: Normal rate and regular rhythm.     Heart sounds: Normal heart sounds. No murmur heard. Pulmonary:      Effort: Pulmonary effort is normal. No respiratory distress.     Breath sounds: Normal breath sounds. No wheezing, rhonchi or rales.  Abdominal:     General: Bowel sounds are normal. There is no distension.     Palpations: Abdomen is soft.     Tenderness: There is no abdominal tenderness. There is no right CVA tenderness, left CVA tenderness, guarding or rebound.  Musculoskeletal:     Right lower leg: No edema.     Left lower leg: No edema.  Skin:    General: Skin is warm and dry.     Coloration: Skin is not jaundiced.  Neurological:     General: No focal deficit present.     Mental Status: She is alert and oriented to person, place, and time.  Psychiatric:        Mood and Affect: Mood normal.        Behavior: Behavior normal.    Urine dipstick shows positive for leukocytes.  Micro exam: 6-10 WBC's per HPF, 0-2 RBC's per HPF, and moderate + bacteria.  No results found for any visits on 09/26/23.      Assessment & Plan:   [name] was seen today for bloated.  Diagnoses and all  orders for this visit:  Abdominal bloating No other symptoms. Reviewed labs, CT imaging, UA, and urine culture from 09/14/23. Normal CT. Declined GI referral today.   Acute cystitis without hematuria Treated with keflex . No symptoms. Will await culture results.  -     Urinalysis, Routine w reflex microscopic -     Urine Culture  Bradycardia No symptoms. Monitor HR at home. Notify for high or low readings or for symptoms.   Return to office for new or worsening symptoms, or if symptoms persist.   The patient indicates understanding of these issues and agrees with the plan.  Albertha Huger, FNP

## 2023-09-29 LAB — URINE CULTURE

## 2023-10-01 ENCOUNTER — Ambulatory Visit: Payer: Self-pay | Admitting: Family Medicine

## 2023-10-01 ENCOUNTER — Other Ambulatory Visit: Payer: Self-pay | Admitting: Family Medicine

## 2023-10-01 DIAGNOSIS — N3000 Acute cystitis without hematuria: Secondary | ICD-10-CM

## 2023-10-01 MED ORDER — AMOXICILLIN-POT CLAVULANATE 875-125 MG PO TABS: 1.0000 | ORAL_TABLET | Freq: Two times a day (BID) | ORAL | 0 refills | Status: AC

## 2023-10-08 ENCOUNTER — Ambulatory Visit: Payer: Self-pay

## 2023-10-08 NOTE — Telephone Encounter (Signed)
 Patient has been on an antibiotic for 2 weeks for a possible UTI. Patient feels like she has not gotten any better. Patient has been having more bowl movements than normal but no diarrhea. Patient can be reached at [phone]  FYI Only or Action Required?: FYI only for provider  Patient was last seen in primary care on 09/26/2023 by Albertha Huger, FNP. Called Nurse Triage reporting Bloated. Symptoms began several weeks ago. Interventions attempted: Prescription medications: augmentin  and cipro. Symptoms are: unchanged.  Triage Disposition: See Physician Within 24 Hours  Patient/caregiver understands and will follow disposition?: Yes      Reason for Disposition  [1] MODERATE diarrhea (e.g., 4-6 times / day more than normal) AND [2] age > 70 years  Answer Assessment - Initial Assessment Questions 1. DIARRHEA SEVERITY: How bad is the diarrhea? How many more stools have you had in the past 24 hours than normal?    - NO DIARRHEA (SCALE 0)   - MILD (SCALE 1-3): Few loose or mushy BMs; increase of 1-3 stools over normal daily number of stools; mild increase in ostomy output.   -  MODERATE (SCALE 4-7): Increase of 4-6 stools daily over normal; moderate increase in ostomy output.   -  SEVERE (SCALE 8-10; OR WORST POSSIBLE): Increase of 7 or more stools daily over normal; moderate increase in ostomy output; incontinence.     Moderate- has gone 4 times this morning alone  2. ONSET: When did the diarrhea begin?      Has been off/on  3. BM CONSISTENCY: How loose or watery is the diarrhea?      Loose stools  4. VOMITING: Are you also vomiting? If Yes, ask: How many times in the past 24 hours?      No  5. ABDOMEN PAIN: Are you having any abdomen pain? If Yes, ask: What does it feel like? (e.g., crampy, dull, intermittent, constant)      No  6. ABDOMEN PAIN SEVERITY: If present, ask: How bad is the pain?  (e.g., Scale 1-10; mild, moderate, or severe)   - MILD (1-3):  doesn't interfere with normal activities, abdomen soft and not tender to touch    - MODERATE (4-7): interferes with normal activities or awakens from sleep, abdomen tender to touch    - SEVERE (8-10): excruciating pain, doubled over, unable to do any normal activities       No  7. ORAL INTAKE: If vomiting, Have you been able to drink liquids? How much liquids have you had in the past 24 hours?     Drinkikng a lot of fluids  8. HYDRATION: Any signs of dehydration? (e.g., dry mouth [not just dry lips], too weak to stand, dizziness, new weight loss) When did you last urinate?     Low energy-  9. EXPOSURE: Have you traveled to a foreign country recently? Have you been exposed to anyone with diarrhea? Could you have eaten any food that was spoiled?     No  10. ANTIBIOTIC USE: Are you taking antibiotics now or have you taken antibiotics in the past 2 months?       Currently being treated for UTI. Was taking Keflex  and then Augmentin   11. OTHER SYMPTOMS: Do you have any other symptoms? (e.g., fever, blood in stool)       weakness 12. PREGNANCY: Is there any chance you are pregnant? When was your last menstrual period?       no  Protocols used: [facility]

## 2023-10-08 NOTE — Telephone Encounter (Signed)
  Duplicate call, pt. Has already been triaged.   Copied from CRM [id number]. Topic: Clinical - Pink Word Triage >> Oct 08, 2023  9:38 AM Karole Pacer C wrote: Reason for Triage: Patient has been on an antibiotic for 2 weeks for a possible UTI. Patient has feel like she has not gotten any better. Patient has been having more bowl movements than normal but no diarrhea. Patient can be reached at [phone] >> Oct 08, 2023  9:40 AM Latavia C wrote: Patient has been on an antibiotic for 2 weeks for a possible UTI. Patient feels like she has not gotten any better. Patient has been having more bowl movements than normal but no diarrhea. Patient can be reached at [phone]

## 2023-10-08 NOTE — Telephone Encounter (Signed)
Appointment tomorrow with pcp.

## 2023-10-09 ENCOUNTER — Encounter: Payer: Self-pay | Admitting: Family

## 2023-10-09 ENCOUNTER — Ambulatory Visit (INDEPENDENT_AMBULATORY_CARE_PROVIDER_SITE_OTHER): Admitting: Family

## 2023-10-09 VITALS — BP 153/79 | HR 47 | Temp 97.7°F | Ht 65.0 in | Wt 158.4 lb

## 2023-10-09 DIAGNOSIS — R197 Diarrhea, unspecified: Secondary | ICD-10-CM | POA: Diagnosis not present

## 2023-10-09 DIAGNOSIS — Z87440 Personal history of urinary (tract) infections: Secondary | ICD-10-CM | POA: Diagnosis not present

## 2023-10-09 DIAGNOSIS — R531 Weakness: Secondary | ICD-10-CM | POA: Diagnosis not present

## 2023-10-09 LAB — URINALYSIS, COMPLETE
Bilirubin, UA: NEGATIVE
Glucose, UA: NEGATIVE
Ketones, UA: NEGATIVE
Leukocytes,UA: NEGATIVE
Nitrite, UA: NEGATIVE
Protein,UA: NEGATIVE
RBC, UA: NEGATIVE
Specific Gravity, UA: 1.015 (ref 1.005–1.030)
Urobilinogen, Ur: 0.2 mg/dL (ref 0.2–1.0)
pH, UA: 6.5 (ref 5.0–7.5)

## 2023-10-09 LAB — MICROSCOPIC EXAMINATION
Bacteria, UA: NONE SEEN
RBC, Urine: NONE SEEN /HPF (ref 0–2)
Renal Epithel, UA: NONE SEEN /HPF
WBC, UA: NONE SEEN /HPF (ref 0–5)
Yeast, UA: NONE SEEN

## 2023-10-09 NOTE — Progress Notes (Signed)
 Subjective:    Patient ID: Kathryn Arnold, female    DOB: 10-29-1945, 78 y.o.   MRN: [id number]  Chief Complaint  Patient presents with   Fatigue    No energy    Diarrhea   Pt presents to the office today with loose stools that started over a month ago. Report five times a day. Having weakness.   She went to the ED on 09/14/23 with these symptoms and diagnosed with UTI. Reports she has completed her antibiotic. Had negative CT scan.  Diarrhea  This is a new problem. The current episode started more than 1 month ago. The problem occurs 5 to 10 times per day. The problem has been unchanged. The stool consistency is described as Watery. Associated symptoms include bloating. Pertinent negatives include no chills, coughing, fever, headaches or vomiting. She has tried increased fluids for the symptoms. The treatment provided mild relief.      Review of Systems  Constitutional:  Negative for chills and fever.  Respiratory:  Negative for cough.   Gastrointestinal:  Positive for bloating and diarrhea. Negative for vomiting.  Neurological:  Negative for headaches.  All other systems reviewed and are negative.   Social History   Socioeconomic History   Marital status: Married    Spouse name: Not on file   Number of children: Not on file   Years of education: Not on file   Highest education level: Not on file  Occupational History   Not on file  Tobacco Use   Smoking status: Never   Smokeless tobacco: Never  Vaping Use   Vaping status: Never Used  Substance and Sexual Activity   Alcohol use: No   Drug use: No   Sexual activity: Not Currently    Birth control/protection: None  Other Topics Concern   Not on file  Social History Narrative   Lives in [place] with spouse.   Social Drivers of Corporate investment banker Strain: Not on file  Food Insecurity: Not on file  Transportation Needs: Not on file  Physical Activity: Not on file  Stress: Not on file  Social  Connections: Not on file   Family History  Problem Relation Age of Onset   Osteoporosis Mother    Cancer Mother        breast   Hypertension Mother    Heart disease Father    Other Neg Hx         Objective:   Physical Exam Vitals reviewed.  Constitutional:      General: She is not in acute distress.    Appearance: She is well-developed.  HENT:     Head: Normocephalic and atraumatic.     Right Ear: Tympanic membrane normal.     Left Ear: Tympanic membrane normal.   Eyes:     Pupils: Pupils are equal, round, and reactive to light.   Neck:     Thyroid : No thyromegaly.   Cardiovascular:     Rate and Rhythm: Normal rate and regular rhythm.     Heart sounds: Normal heart sounds. No murmur heard. Pulmonary:     Effort: Pulmonary effort is normal. No respiratory distress.     Breath sounds: Normal breath sounds. No wheezing.  Abdominal:     General: Bowel sounds are normal. There is no distension.     Palpations: Abdomen is soft.     Tenderness: There is no abdominal tenderness.   Musculoskeletal:        General: No tenderness.  Normal range of motion.     Cervical back: Normal range of motion and neck supple.   Skin:    General: Skin is warm and dry.   Neurological:     Mental Status: She is alert and oriented to person, place, and time.     Cranial Nerves: No cranial nerve deficit.     Deep Tendon Reflexes: Reflexes are normal and symmetric.   Psychiatric:        Behavior: Behavior normal.        Thought Content: Thought content normal.        Judgment: Judgment normal.       BP (!) 153/79   Pulse (!) 47   Temp 97.7 F (36.5 C) (Temporal)   Ht 5' 5 (1.651 m)   Wt 158 lb 6.4 oz (71.8 kg)   BMI 26.36 kg/m      Assessment & Plan:  XOE HOE comes in today with chief complaint of Fatigue (No energy ) and Diarrhea   Diagnosis and orders addressed:  1. Diarrhea, unspecified type (Primary) - Cdiff NAA+O+P+Stool Culture - CMP14+EGFR - CBC with  Differential/Platelet  2. Weakness - CMP14+EGFR - CBC with Differential/Platelet  3. Hx: UTI (urinary tract infection) - Urinalysis, Complete   Labs pending- stool panel pending Stop oral magnesium  Imodium as needed  If all labs are normal and symptoms continue will need GI referral.  Health Maintenance reviewed Diet and exercise encouraged  No follow-ups on file.    Tommas Fragmin, FNP

## 2023-10-09 NOTE — Patient Instructions (Signed)
 Diarrhea, Adult Diarrhea is frequent loose and sometimes watery bowel movements. Diarrhea can make you feel weak and cause you to become dehydrated. Dehydration is a condition in which there is not enough water or other fluids in the body. Dehydration can make you tired and thirsty, cause you to have a dry mouth, and decrease how often you urinate. Diarrhea typically lasts 2-3 days. However, it can last longer if it is a sign of something more serious. It is important to treat your diarrhea as told by your health care provider. Follow these instructions at home: Eating and drinking     Follow these recommendations as told by your health care provider: Take an oral rehydration solution (ORS). This is an over-the-counter medicine that helps return your body to its normal balance of nutrients and water. It is found at pharmacies and retail stores. Drink enough fluid to keep your urine pale yellow. Drink fluids such as water, diluted fruit juice, and low-calorie sports drinks. You can drink milk also, if desired. Sucking on ice chips is another way to get fluids. Avoid drinking fluids that contain a lot of sugar or caffeine, such as soda, energy drinks, and regular sports drinks. Avoid alcohol. Eat bland, easy-to-digest foods in small amounts as you are able. These foods include bananas, applesauce, rice, lean meats, toast, and crackers. Avoid spicy or fatty foods.  Medicines Take over-the-counter and prescription medicines only as told by your health care provider. If you were prescribed antibiotics, take them as told by your health care provider. Do not stop using the antibiotic even if you start to feel better. General instructions  Wash your hands often using soap and water for at least 20 seconds. If soap and water are not available, use hand sanitizer. Others in the household should wash their hands as well. Hands should be washed: After using the toilet or changing a diaper. Before  preparing, cooking, or serving food. While caring for a sick person or while visiting someone in a hospital. Rest at home while you recover. Take a warm bath to relieve any burning or pain from frequent diarrhea episodes. Watch your condition for any changes. Contact a health care provider if: You have a fever. Your diarrhea gets worse. You have new symptoms. You vomit every time you eat or drink. You feel light-headed, dizzy, or have a headache. You have muscle cramps. You have signs of dehydration, such as: Dark urine, very little urine, or no urine. Cracked lips. Dry mouth. Sunken eyes. Sleepiness. Weakness. You have bloody or black stools or stools that look like tar. You have severe pain, cramping, or bloating in your abdomen. Your skin feels cold and clammy. You feel confused. Get help right away if: You have chest pain or your heart is beating very quickly. You have trouble breathing or you are breathing very quickly. You feel extremely weak or you faint. These symptoms may be an emergency. Get help right away. Call 911. Do not wait to see if the symptoms will go away. Do not drive yourself to the hospital. This information is not intended to replace advice given to you by your health care provider. Make sure you discuss any questions you have with your health care provider. Document Revised: 09/27/2021 Document Reviewed: 09/27/2021 Elsevier Patient Education  2024 [employer].

## 2023-10-10 ENCOUNTER — Other Ambulatory Visit

## 2023-10-10 LAB — CBC WITH DIFFERENTIAL/PLATELET
Basophils Absolute: 0.1 10*3/uL (ref 0.0–0.2)
Basos: 1 %
EOS (ABSOLUTE): 0.1 10*3/uL (ref 0.0–0.4)
Eos: 1 %
Hematocrit: 42.8 % (ref 34.0–46.6)
Hemoglobin: 14.1 g/dL (ref 11.1–15.9)
Immature Grans (Abs): 0 10*3/uL (ref 0.0–0.1)
Immature Granulocytes: 0 %
Lymphocytes Absolute: 2 10*3/uL (ref 0.7–3.1)
Lymphs: 32 %
MCH: 30.2 pg (ref 26.6–33.0)
MCHC: 32.9 g/dL (ref 31.5–35.7)
MCV: 92 fL (ref 79–97)
Monocytes Absolute: 0.5 10*3/uL (ref 0.1–0.9)
Monocytes: 8 %
Neutrophils Absolute: 3.5 10*3/uL (ref 1.4–7.0)
Neutrophils: 58 %
Platelets: 230 10*3/uL (ref 150–450)
RBC: 4.67 x10E6/uL (ref 3.77–5.28)
RDW: 11.8 % (ref 11.7–15.4)
WBC: 6.1 10*3/uL (ref 3.4–10.8)

## 2023-10-10 LAB — CMP14+EGFR
ALT: 20 IU/L (ref 0–32)
AST: 21 IU/L (ref 0–40)
Albumin: 4.4 g/dL (ref 3.8–4.8)
Alkaline Phosphatase: 59 IU/L (ref 44–121)
BUN/Creatinine Ratio: 27 (ref 12–28)
BUN: 19 mg/dL (ref 8–27)
Bilirubin Total: 0.3 mg/dL (ref 0.0–1.2)
CO2: 19 mmol/L — ABNORMAL LOW (ref 20–29)
Calcium: 9.6 mg/dL (ref 8.7–10.3)
Chloride: 105 mmol/L (ref 96–106)
Creatinine, Ser: 0.71 mg/dL (ref 0.57–1.00)
Globulin, Total: 2.4 g/dL (ref 1.5–4.5)
Glucose: 104 mg/dL — ABNORMAL HIGH (ref 70–99)
Potassium: 4.1 mmol/L (ref 3.5–5.2)
Sodium: 141 mmol/L (ref 134–144)
Total Protein: 6.8 g/dL (ref 6.0–8.5)
eGFR: 89 mL/min/{1.73_m2} (ref >59)

## 2023-10-11 ENCOUNTER — Telehealth: Payer: Self-pay

## 2023-10-11 NOTE — Telephone Encounter (Signed)
 Called patient to verify insurance to proceed with having her prolia approved and the patient stated that she has insurance and her insurance does not cover the prolia and that she decided not to have the prolia and thanked me for calling

## 2023-10-12 ENCOUNTER — Telehealth: Payer: Self-pay

## 2023-10-12 NOTE — Telephone Encounter (Signed)
 Labs look good- will be addressed by [facility] when she returns

## 2023-10-12 NOTE — Telephone Encounter (Signed)
 Please review. C-diff results still pending

## 2023-10-12 NOTE — Telephone Encounter (Signed)
 Copied from CRM [id number]. Topic: Clinical - Lab/Test Results >> Oct 12, 2023  1:37 PM Carlatta H wrote: Reason for CRM: Please call patient with lab results

## 2023-10-12 NOTE — Telephone Encounter (Signed)
 Please review and advise.

## 2023-10-15 ENCOUNTER — Ambulatory Visit: Payer: Self-pay | Admitting: Family

## 2023-10-15 LAB — CDIFF NAA+O+P+STOOL CULTURE
E coli, Shiga toxin Assay: NEGATIVE
Toxigenic C. Difficile by PCR: NEGATIVE
Toxigenic C. Difficile by PCR: NEGATIVE

## 2023-11-24 ENCOUNTER — Other Ambulatory Visit: Payer: Self-pay | Admitting: Family

## 2023-11-24 DIAGNOSIS — E785 Hyperlipidemia, unspecified: Secondary | ICD-10-CM

## 2023-11-24 DIAGNOSIS — I480 Paroxysmal atrial fibrillation: Secondary | ICD-10-CM

## 2023-11-24 DIAGNOSIS — I10 Essential (primary) hypertension: Secondary | ICD-10-CM

## 2023-11-27 ENCOUNTER — Other Ambulatory Visit: Payer: Self-pay | Admitting: Family

## 2023-11-27 DIAGNOSIS — I480 Paroxysmal atrial fibrillation: Secondary | ICD-10-CM

## 2023-11-27 DIAGNOSIS — E785 Hyperlipidemia, unspecified: Secondary | ICD-10-CM

## 2023-11-27 DIAGNOSIS — I10 Essential (primary) hypertension: Secondary | ICD-10-CM

## 2023-11-28 ENCOUNTER — Telehealth: Payer: Self-pay

## 2023-11-28 NOTE — Telephone Encounter (Signed)
 Called patient and she said she never told anyone that she was going to take that medication and  she is not going to take it Ever

## 2023-11-30 ENCOUNTER — Other Ambulatory Visit: Payer: Self-pay | Admitting: Family

## 2023-11-30 DIAGNOSIS — I480 Paroxysmal atrial fibrillation: Secondary | ICD-10-CM

## 2023-12-10 ENCOUNTER — Encounter: Payer: Self-pay | Admitting: Family

## 2023-12-10 ENCOUNTER — Ambulatory Visit (INDEPENDENT_AMBULATORY_CARE_PROVIDER_SITE_OTHER): Admitting: Family

## 2023-12-10 VITALS — BP 135/73 | HR 59 | Temp 97.8°F | Ht 65.0 in | Wt 158.8 lb

## 2023-12-10 DIAGNOSIS — E559 Vitamin D deficiency, unspecified: Secondary | ICD-10-CM

## 2023-12-10 DIAGNOSIS — I10 Essential (primary) hypertension: Secondary | ICD-10-CM | POA: Diagnosis not present

## 2023-12-10 DIAGNOSIS — M810 Age-related osteoporosis without current pathological fracture: Secondary | ICD-10-CM

## 2023-12-10 DIAGNOSIS — E785 Hyperlipidemia, unspecified: Secondary | ICD-10-CM

## 2023-12-10 DIAGNOSIS — M1990 Unspecified osteoarthritis, unspecified site: Secondary | ICD-10-CM | POA: Diagnosis not present

## 2023-12-10 DIAGNOSIS — F411 Generalized anxiety disorder: Secondary | ICD-10-CM | POA: Diagnosis not present

## 2023-12-10 DIAGNOSIS — I4819 Other persistent atrial fibrillation: Secondary | ICD-10-CM

## 2023-12-10 NOTE — Patient Instructions (Signed)
 Preventing Osteoporosis, Adult Osteoporosis is a condition that causes the bones to lose density. This means that the bones become thinner, and the normal spaces in bone tissue become larger. Low bone density can make the bones weak and cause them to break more easily. Osteoporosis cannot always be prevented, but you can take steps to lower your risk of developing this condition. How can this condition affect me? If you develop osteoporosis, you will be more likely to break bones in your wrist, spine, or hip. Even a minor accident or injury can be enough to break weak bones. The bones will also be slower to heal. Osteoporosis can cause other problems as well, such as a stooped posture or trouble with movement. Osteoporosis can occur with aging. As you get older, you may lose bone tissue more quickly, or it may be replaced more slowly. Osteoporosis is more likely to develop if you have poor nutrition or do not get enough calcium or vitamin D. Other lifestyle factors can also play a role. By eating a well-balanced diet and making lifestyle changes, you can help keep your bones strong and healthy, lowering your chances of developing osteoporosis. What can increase my risk? The following factors may make you more likely to develop osteoporosis: Having a family history of the condition. Having poor nutrition or not getting enough calcium or vitamin D. Using certain medicines, such as steroid medicines or anti-seizure medicines. Being any of the following: 59 years of age or older. Female. A woman who has gone through menopause (is postmenopausal). A person who is of European or Asian descent. Using products that contain nicotine or tobacco, such as cigarettes, e-cigarettes, and chewing tobacco. Not being physically active (being sedentary). Having a small body frame. What actions can I take to prevent this? Get enough calcium  Make sure you get enough calcium every day. Calcium is the most important  mineral for bone health. Most people can get enough calcium from their diet, but supplements may be recommended for people who are at risk for osteoporosis. Follow these guidelines: If you are age 31 or younger, aim to get 1,000 milligrams (mg) of calcium every day. If you are older than age 52, aim to get 1,200 mg of calcium every day. Good sources of calcium include: Dairy products, such as low-fat or nonfat milk, cheese, and yogurt. Dark green leafy vegetables, such as bok choy and broccoli. Foods that have had calcium added to them (calcium-fortified foods), such as orange juice, cereal, bread, soy beverages, and tofu products. Nuts, such as almonds. Check nutrition labels to see how much calcium is in a food or drink. Get enough vitamin D Try to get enough vitamin D every day. Vitamin D is the most essential vitamin for bone health. It helps the body absorb calcium. Follow these guidelines for how much vitamin D to get from food: If you are age 36 or younger, aim to get at least 600 international units (IU) every day. Your health care provider may suggest more. If you are older than age 67, aim to get at least 800 international units every day. Your health care provider may suggest more. Good sources of vitamin D in your diet include: Egg yolks. Oily fish, such as salmon, sardines, and tuna. Milk and cereal fortified with vitamin D. Your body also makes vitamin D when you are out in the sun. Exposing the bare skin on your face, arms, legs, or back to the sun for no more than 30 minutes a  day, 2 times a week is more than enough. Beyond that, make sure you use sunblock to protect your skin from sunburn, which increases your risk for skin cancer. Exercise  Stay active and get exercise every day. Ask your health care provider what types of exercise are best for you. Weight-bearing and strength-building activities are important for building and maintaining healthy bones. Some examples of these  types of activities include: Walking and hiking. Jogging and running. Dancing. Gym exercises and lifting weights. Tennis and racquetball. Climbing stairs. Tai chi. Make other lifestyle changes Do not use any products that contain nicotine or tobacco, such as cigarettes, e-cigarettes, and chewing tobacco. If you need help quitting, ask your health care provider. Lose weight if you are overweight. If you drink alcohol: Limit how much you use to: 0-1 drink a day for women who are not pregnant. 0-2 drinks a day for men. Be aware of how much alcohol is in your drink. In the [place] one drink equals one 12 oz bottle of beer (355 mL), one 5 oz glass of wine (148 mL), or one 1 oz glass of hard liquor (44 mL). Where to find support If you need help making changes to prevent osteoporosis, talk with your health care provider. You can ask for a referral to a dietitian and a physical therapist. Where to find more information Learn more about osteoporosis from: NIH Osteoporosis and Related Bone Diseases National Resource Center: www.bones.http://www.myers.net/ [place] Office on [employer] Health: http://hoffman.com/ National Osteoporosis Foundation: [employer] Summary Osteoporosis is a condition that causes weak bones that are more likely to break. Eat a healthy diet, making sure you get enough calcium and vitamin D, and stay active by getting regular exercise to help prevent osteoporosis. Other ways to reduce your risk of osteoporosis include maintaining a healthy weight and avoiding alcohol and products that contain nicotine or tobacco. This information is not intended to replace advice given to you by your health care provider. Make sure you discuss any questions you have with your health care provider. Document Revised: 12/12/2022 Document Reviewed: 12/12/2022 Elsevier Patient Education  2024 [employer].

## 2023-12-10 NOTE — Progress Notes (Signed)
 Subjective:    Patient ID: Kathryn Arnold, female    DOB: Jan 02, 1946, 78 y.o.   MRN: [id number]  Chief Complaint  Patient presents with   Medical Management of Chronic Issues   Pt presents to the office today for chronic follow up .   Pt is followed by Cardiologists for A Fib  annually. Had an ablation on 10/31/16 and 2016.  She had cardioversion in 08/21. States since changing her diet and her procedures her A fib has improved.  She takes Xarelto  20 mg daily.    She has osteoporosis and takes Fosamax  weekly. Her last Dexa scan was 07/18/23.  Hypertension This is a chronic problem. The current episode started more than 1 year ago. The problem has been resolved since onset. The problem is controlled. Associated symptoms include anxiety and malaise/fatigue (some times). Pertinent negatives include no peripheral edema or shortness of breath. Risk factors for coronary artery disease include dyslipidemia. The current treatment provides moderate improvement.  Arthritis Presents for follow-up visit. She complains of pain and stiffness. Affected locations include the left knee and right knee. Her pain is at a severity of 0/10 (some times it flares up).  Hyperlipidemia This is a chronic problem. The current episode started more than 1 year ago. The problem is uncontrolled. Recent lipid tests were reviewed and are high. She has no history of obesity. Pertinent negatives include no shortness of breath. Current antihyperlipidemic treatment includes statins. The current treatment provides moderate improvement of lipids. Risk factors for coronary artery disease include dyslipidemia, hypertension, a sedentary lifestyle and post-menopausal.  Anxiety Presents for follow-up visit. Patient reports no excessive worry, nervous/anxious behavior or shortness of breath. Symptoms occur rarely. The severity of symptoms is mild.        Review of Systems  Constitutional:  Positive for malaise/fatigue (some  times).  Respiratory:  Negative for shortness of breath.   Musculoskeletal:  Positive for stiffness.  Psychiatric/Behavioral:  The patient is not nervous/anxious.   All other systems reviewed and are negative.  Family History  Problem Relation Age of Onset   Osteoporosis Mother    Cancer Mother        breast   Hypertension Mother    Heart disease Father    Other Neg Hx    Social History   Socioeconomic History   Marital status: Married    Spouse name: Not on file   Number of children: Not on file   Years of education: Not on file   Highest education level: Not on file  Occupational History   Not on file  Tobacco Use   Smoking status: Never   Smokeless tobacco: Never  Vaping Use   Vaping status: Never Used  Substance and Sexual Activity   Alcohol use: No   Drug use: No   Sexual activity: Not Currently    Birth control/protection: None  Other Topics Concern   Not on file  Social History Narrative   Lives in [place] with spouse.   Social Drivers of Corporate investment banker Strain: Not on file  Food Insecurity: Not on file  Transportation Needs: Not on file  Physical Activity: Not on file  Stress: Not on file  Social Connections: Not on file        Objective:   Physical Exam Vitals reviewed.  Constitutional:      General: She is not in acute distress.    Appearance: She is well-developed.  HENT:     Head: Normocephalic and  atraumatic.     Right Ear: Tympanic membrane normal.     Left Ear: Tympanic membrane normal.  Eyes:     Pupils: Pupils are equal, round, and reactive to light.  Neck:     Thyroid : No thyromegaly.  Cardiovascular:     Rate and Rhythm: Normal rate and regular rhythm.     Heart sounds: Normal heart sounds. No murmur heard. Pulmonary:     Effort: Pulmonary effort is normal. No respiratory distress.     Breath sounds: Normal breath sounds. No wheezing.  Abdominal:     General: Bowel sounds are normal. There is no distension.      Palpations: Abdomen is soft.     Tenderness: There is no abdominal tenderness.  Musculoskeletal:        General: No tenderness. Normal range of motion.     Cervical back: Normal range of motion and neck supple.  Skin:    General: Skin is warm and dry.  Neurological:     Mental Status: She is alert and oriented to person, place, and time.     Cranial Nerves: No cranial nerve deficit.     Deep Tendon Reflexes: Reflexes are normal and symmetric.  Psychiatric:        Behavior: Behavior normal.        Thought Content: Thought content normal.        Judgment: Judgment normal.       BP 135/73   Pulse (!) 59   Temp 97.8 F (36.6 C) (Temporal)   Ht 5' 5 (1.651 m)   Wt 158 lb 12.8 oz (72 kg)   BMI 26.43 kg/m      Assessment & Plan:  Kathryn Arnold comes in today with chief complaint of Medical Management of Chronic Issues   Diagnosis and orders addressed:  1. Arthritis (Primary) - CMP14+EGFR - CBC with Differential/Platelet  2. Persistent atrial fibrillation (HCC) - CMP14+EGFR - CBC with Differential/Platelet  3. Essential hypertension, benign - AMB Referral [facility] Care Management - CMP14+EGFR - CBC with Differential/Platelet  4. GAD (generalized anxiety disorder) - CMP14+EGFR - CBC with Differential/Platelet  5. Hyperlipidemia with target LDL less than 100 - AMB Referral [facility] Care Management - CMP14+EGFR - CBC with Differential/Platelet  6. Osteoporosis, unspecified osteoporosis type, unspecified pathological fracture presence - AMB Referral [facility] Care Management - CMP14+EGFR - CBC with Differential/Platelet - VITAMIN D  25 Hydroxy (Vit-D Deficiency, Fractures)  7. Vitamin D  deficiency - CMP14+EGFR - CBC with Differential/Platelet - VITAMIN D  25 Hydroxy (Vit-D Deficiency, Fractures)   Labs reviewed  Referral to [facility] for education for osteoporosis  Health Maintenance reviewed Diet and exercise encouraged  Follow up plan: 6 months    Bari Learn,  FNP

## 2023-12-11 ENCOUNTER — Ambulatory Visit: Payer: Self-pay | Admitting: Family

## 2023-12-11 LAB — CBC WITH DIFFERENTIAL/PLATELET
Basophils Absolute: 0 x10E3/uL (ref 0.0–0.2)
Basos: 1 %
EOS (ABSOLUTE): 0.1 x10E3/uL (ref 0.0–0.4)
Eos: 1 %
Hematocrit: 43 % (ref 34.0–46.6)
Hemoglobin: 14.3 g/dL (ref 11.1–15.9)
Immature Grans (Abs): 0 x10E3/uL (ref 0.0–0.1)
Immature Granulocytes: 0 %
Lymphocytes Absolute: 1.6 x10E3/uL (ref 0.7–3.1)
Lymphs: 33 %
MCH: 30.4 pg (ref 26.6–33.0)
MCHC: 33.3 g/dL (ref 31.5–35.7)
MCV: 92 fL (ref 79–97)
Monocytes Absolute: 0.4 x10E3/uL (ref 0.1–0.9)
Monocytes: 8 %
Neutrophils Absolute: 2.8 x10E3/uL (ref 1.4–7.0)
Neutrophils: 57 %
Platelets: 262 x10E3/uL (ref 150–450)
RBC: 4.7 x10E6/uL (ref 3.77–5.28)
RDW: 12.2 % (ref 11.7–15.4)
WBC: 4.9 x10E3/uL (ref 3.4–10.8)

## 2023-12-11 LAB — CMP14+EGFR
ALT: 20 IU/L (ref 0–32)
AST: 24 IU/L (ref 0–40)
Albumin: 4.3 g/dL (ref 3.8–4.8)
Alkaline Phosphatase: 64 IU/L (ref 44–121)
BUN/Creatinine Ratio: 27 (ref 12–28)
BUN: 17 mg/dL (ref 8–27)
Bilirubin Total: 0.3 mg/dL (ref 0.0–1.2)
CO2: 21 mmol/L (ref 20–29)
Calcium: 9.5 mg/dL (ref 8.7–10.3)
Chloride: 104 mmol/L (ref 96–106)
Creatinine, Ser: 0.63 mg/dL (ref 0.57–1.00)
Globulin, Total: 2.5 g/dL (ref 1.5–4.5)
Glucose: 99 mg/dL (ref 70–99)
Potassium: 4.2 mmol/L (ref 3.5–5.2)
Sodium: 139 mmol/L (ref 134–144)
Total Protein: 6.8 g/dL (ref 6.0–8.5)
eGFR: 92 mL/min/1.73 (ref >59)

## 2023-12-11 LAB — VITAMIN D 25 HYDROXY (VIT D DEFICIENCY, FRACTURES): Vit D, 25-Hydroxy: 42.9 ng/mL (ref 30.0–100.0)

## 2023-12-12 ENCOUNTER — Telehealth: Payer: Self-pay

## 2023-12-12 NOTE — Progress Notes (Signed)
 Care Guide Pharmacy Note  12/12/2023 Name: [name] MRN: [id number] DOB: 1945/05/28  Referred By: Lavell Bari LABOR, FNP Reason for referral: Complex Care Management (Outreach to schedule with Pharm d )   Kathryn Arnold is a 78 y.o. year old female who is a primary care patient of Lavell Bari LABOR, FNP.  Orlean LITTIE Coe was referred to the pharmacist for assistance related to: HLD  Successful contact was made with the patient to discuss pharmacy services including being ready for the pharmacist to call at least 5 minutes before the scheduled appointment time and to have medication bottles and any blood pressure readings ready for review. The patient agreed to meet with the pharmacist via Face to face  on (date/time).12/20/2023  Jeoffrey Buffalo , RMA     [place]  [facility], [facility] Guide  Direct Dial: [phone]  Website: [email or web address]

## 2023-12-20 ENCOUNTER — Ambulatory Visit

## 2023-12-20 ENCOUNTER — Telehealth: Payer: Self-pay | Admitting: Family

## 2023-12-25 ENCOUNTER — Other Ambulatory Visit: Payer: Self-pay | Admitting: Family

## 2023-12-25 DIAGNOSIS — I480 Paroxysmal atrial fibrillation: Secondary | ICD-10-CM

## 2023-12-27 ENCOUNTER — Ambulatory Visit (INDEPENDENT_AMBULATORY_CARE_PROVIDER_SITE_OTHER): Admitting: Pharmacist

## 2023-12-27 DIAGNOSIS — M818 Other osteoporosis without current pathological fracture: Secondary | ICD-10-CM

## 2023-12-27 NOTE — Progress Notes (Signed)
 12/27/2023 Name: Kathryn Arnold MRN: [id number] DOB: 1945/09/05  Chief Complaint  Patient presents with   Osteoporosis    UNIQUE SILLAS is a 78 y.o. year old female who was referred for medication management by their primary care provider, Lavell Bari LABOR, FNP. They presented for a face to face visit today.   They were referred to the pharmacist by their PCP for assistance in managing osteoporosis     Subjective:  Patient reports no falls, however reports small vertebral compression fracture after coughing/RSV this past January.  Ortho had recommended Prolia/Evenity  Care Team: Primary Care Provider: Lavell Bari LABOR, FNP ;  Medication Access/Adherence  Current Pharmacy:  [facility] [address] [place] - [address] [place] [address] Phone: [phone] Fax: [phone]   Patient reports affordability concerns with their medications: No  Patient reports access/transportation concerns to their pharmacy: No  Patient reports adherence concerns with their medications:  No     Osteoporosis:  Current medications: alendronate  took holiday in the past; 2027 will be total 10 yrs Medications tried in the past: n/a  Current supplements: centrum silver (has vit D), citracal w/ D--will now be getting 5,000IUs of vitamin D  daily  Current physical activity: lifts weights, very active  Most recent DEXA: 07/19/23 Exclusions: L1.   COMPARISON:  06/17/2021.   FINDINGS: Scan quality: Good.   LUMBAR SPINE (L2-L4):  BMD (in g/cm2): 0.965  T-score: -2.0  Z-score: -0.4   Rate of change from previous exam: 7.2 %   LEFT FEMORAL NECK:  BMD (in g/cm2): 0.697  T-score: -2.5  Z-score: -0.6   LEFT TOTAL HIP:  BMD (in g/cm2): 0.716  T-score: -2.3  Z-score: -0.7   RIGHT FEMORAL NECK:  BMD (in g/cm2): 0.678  T-score: -2.6  Z-score: -0.8   RIGHT TOTAL HIP:  BMD (in g/cm2): 0.714  T-score: -2.3  Z-score: -0.7   DUAL-FEMUR TOTAL MEAN:    Rate of change from previous exam: No significant rate of change from previous exam.   FRAX 10-YEAR PROBABILITY OF FRACTURE:   FRAX not reported as the lowest BMD is not in the osteopenia range.   IMPRESSION: Osteoporosis based on BMD.  Current medication access support: n/a   Objective:  No results found for: HGBA1C  Lab Results  Component Value Date   CREATININE 0.63 12/10/2023   BUN 17 12/10/2023   NA 139 12/10/2023   K 4.2 12/10/2023   CL 104 12/10/2023   CO2 21 12/10/2023    Lab Results  Component Value Date   CHOL 195 06/07/2023   HDL 68 06/07/2023   LDLCALC 110 (H) 06/07/2023   TRIG 95 06/07/2023   CHOLHDL 2.9 06/07/2023    Medications Reviewed Today     Reviewed by Billee Mliss BIRCH, [facility] (Pharmacist) on 12/27/23 at 1510  Med List Status: <None>   Medication Order Taking? Sig Documenting Provider Last Dose Status Informant  alendronate  (FOSAMAX ) 70 MG tablet [id number]  TAKE 1 TABLET BY MOUTH ONCE A WEEK WITH A FULL GLASS OF WATER ON AN EMPTY STOMACH. DO NOT LIE DOWN FOR 30 MINUTES. REMAIN UPRIGHT Hawks, Christy A, FNP  Active   Calcium  Citrate-Vitamin D  (CITRACAL + D PO) [id number]  Take 500 mg by mouth 2 (two) times daily. [provider]  Active   diltiazem  (CARDIZEM  CD) 180 MG 24 hr capsule [id number]  Take 1 capsule by mouth once daily Lavell Bari A, FNP  Active   diltiazem  (CARDIZEM )  60 MG tablet [id number]  Take 0.5-1 tablets (30-60 mg total) by mouth every 6 (six) hours as needed (Heart rate over 100 as long as systolic blood pressure >100). Lavell Lye A, FNP  Active   flecainide  (TAMBOCOR ) 50 MG tablet [id number]  Take 1 tablet by mouth twice daily Lavell Lye LABOR, [place]  Active   glucosamine-chondroitin 500-400 MG tablet [id number]  Take 2 tablets by mouth daily. [provider]  Active   Magnesium 250 MG TABS [id number]  Take 1 tablet by mouth every evening. [provider]  Active   Multiple Vitamins-Minerals (CENTRUM  SILVER PO) [id number]  Take 1 tablet by mouth daily. [provider]  Active   rosuvastatin  (CRESTOR ) 20 MG tablet [id number]  TAKE 1 TABLET BY MOUTH AT BEDTIME Lavell Lye A, FNP  Active   valACYclovir  (VALTREX ) 500 MG tablet [id number]  Take 1 tablet (500 mg total) by mouth daily. Lavell Lye LABOR, FNP  Active   XARELTO  20 MG TABS tablet [id number]  TAKE 1 TABLET BY MOUTH AT BEDTIME Lavell Lye A, FNP  Active               Assessment/Plan:   Osteoporosis: - Reviewed recommendation for daily calcium  intake of 1200 mg and vitamin D  intake  - Recommended to choose calcium  citrate formulation due to concurrent acid reflux medication - Reviewed benefits of weight bearing exercise--patient to continue - Recommend to continue Fosamax  (alendronate ) for 2 more yrs (took for 5 yrs then holiday, now continuing on) - Patient not interested in injections like Prolia or Evevnity, but we did discuss the potential later on.     Follow Up Plan: DEXA in 2 yrs then stop bisphosphonates; consider alternative  Govind Furey Dattero Annely Sliva, PharmD, BCACP, CPP Clinical Pharmacist, [facility]

## 2024-01-21 ENCOUNTER — Other Ambulatory Visit: Payer: Self-pay | Admitting: Physician Assistant

## 2024-01-21 DIAGNOSIS — M810 Age-related osteoporosis without current pathological fracture: Secondary | ICD-10-CM

## 2024-01-21 DIAGNOSIS — M8000XA Age-related osteoporosis with current pathological fracture, unspecified site, initial encounter for fracture: Secondary | ICD-10-CM

## 2024-01-21 MED ORDER — DENOSUMAB 60 MG/ML ~~LOC~~ SOSY: 60.0000 mg | PREFILLED_SYRINGE | Freq: Once | SUBCUTANEOUS | Status: AC

## 2024-01-22 ENCOUNTER — Ambulatory Visit (INDEPENDENT_AMBULATORY_CARE_PROVIDER_SITE_OTHER)

## 2024-01-22 ENCOUNTER — Ambulatory Visit: Payer: Self-pay

## 2024-01-22 ENCOUNTER — Ambulatory Visit (INDEPENDENT_AMBULATORY_CARE_PROVIDER_SITE_OTHER): Admitting: Family Medicine

## 2024-01-22 ENCOUNTER — Encounter: Payer: Self-pay | Admitting: Family Medicine

## 2024-01-22 ENCOUNTER — Other Ambulatory Visit: Payer: Self-pay | Admitting: Family Medicine

## 2024-01-22 VITALS — BP 142/69 | HR 68 | Temp 97.9°F | Ht 65.0 in | Wt 157.8 lb

## 2024-01-22 DIAGNOSIS — M79605 Pain in left leg: Secondary | ICD-10-CM | POA: Diagnosis not present

## 2024-01-22 DIAGNOSIS — M25552 Pain in left hip: Secondary | ICD-10-CM

## 2024-01-22 DIAGNOSIS — M810 Age-related osteoporosis without current pathological fracture: Secondary | ICD-10-CM | POA: Diagnosis not present

## 2024-01-22 MED ORDER — PREDNISONE 20 MG PO TABS: 40.0000 mg | ORAL_TABLET | Freq: Every day | ORAL | 0 refills | Status: AC

## 2024-01-22 NOTE — Progress Notes (Signed)
 Subjective:  Patient ID: Kathryn Arnold, female    DOB: 1945-05-29, 78 y.o.   MRN: [id number]  Patient Care Team: Lavell Bari LABOR, FNP as PCP - General (Nurse Practitioner)   Chief Complaint:  Leg Pain (Left leg pain that started this morning when she woke up. )   HPI: Kathryn Arnold is a 78 y.o. female presenting on 01/22/2024 for Leg Pain (Left leg pain that started this morning when she woke up. )    Kathryn Arnold is a 78 year old female with a history of low back fracture who presents with left upper leg and hip pain.  She woke up with severe pain in the left upper leg, specifically in the back and extending upwards towards the hip. The pain is described as similar to when she had a low back fracture in January, which took until August to improve. No recent injury or fall. The pain does not radiate down the leg.  She has tried Tylenol  and ice for the pain without relief. In the past, she received a steroid shot and used Robaxin  and lidocaine  patches for similar pain. She also took prednisone  in February for back pain.  She has been on Fosamax  for over ten years for osteoporosis. She is not currently on any osteoporosis medication due to concerns about side effects, despite having a history of fractures. She continues to take calcium  and vitamin D  supplements.  No loss of bowel or bladder function and she is able to walk despite the pain.       Relevant past medical, surgical, family, and social history reviewed and updated as indicated.  Allergies and medications reviewed and updated. Data reviewed: Chart in Epic.   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   HSV-2 (herpes simplex virus 2) infection    Hypercholesteremia    Hyperlipidemia with target LDL less than 100    Hypertension     Osteoporosis, unspecified    Last DEXA 10/2011    Paroxysmal atrial fibrillation (HCC)    s/p ablation Afib and flutter 11/03/14   PONV (postoperative nausea and vomiting)    Vitamin D  deficiency     Past Surgical History:  Procedure Laterality Date   ATRIAL FIBRILLATION ABLATION N/A 10/31/2016   Procedure: Atrial Fibrillation Ablation;  Surgeon: Kelsie Agent, MD;  Location: [facility] INVASIVE CV LAB;  Service: Cardiovascular;  Laterality: N/A;   BREAST LUMPECTOMY Left    DILATION AND CURETTAGE OF UTERUS     ELECTROPHYSIOLOGIC STUDY N/A 11/03/2014   Procedure: Atrial Fibrillation Ablation;  Surgeon: Agent Kelsie, MD;  Location: [facility] INVASIVE CV LAB;  Service: Cardiovascular;  Laterality: N/A;   implantable loop recorder placement  03/08/2018   Medtronic Reveal LINQ implantation by Dr Kelsie for afib management post ablation   LAPAROSCOPY ABDOMEN DIAGNOSTIC  1990s   LOOP RECORDER REMOVAL  09/17/2019   MDT Reveal LINQ removed in office by Dr Kelsie   TEE WITHOUT CARDIOVERSION N/A 10/30/2016   Procedure: TRANSESOPHAGEAL ECHOCARDIOGRAM (TEE);  Surgeon: Francyne Headland, MD;  Location: [facility] ENDOSCOPY;  Service: Cardiovascular;  Laterality: N/A;   TUBAL LIGATION  1978    Social History   Socioeconomic History   Marital status: Married    Spouse name: Not on file   Number of  children: Not on file   Years of education: Not on file   Highest education level: Not on file  Occupational History   Not on file  Tobacco Use   Smoking status: Never   Smokeless tobacco: Never  Vaping Use   Vaping status: Never Used  Substance and Sexual Activity   Alcohol use: No   Drug use: No   Sexual activity: Not Currently    Birth control/protection: None  Other Topics Concern   Not on file  Social History Narrative   Lives in [place] with spouse.   Social Drivers of Corporate investment banker Strain: Not on file  Food Insecurity: Not on file  Transportation Needs: Not on file  Physical Activity: Not on  file  Stress: Not on file  Social Connections: Not on file  Intimate Partner Violence: Not At Risk (03/13/2018)   Humiliation, Afraid, Rape, and Kick questionnaire    Fear of Current or Ex-Partner: No    Emotionally Abused: No    Physically Abused: No    Sexually Abused: No    Outpatient Encounter Medications as of 01/22/2024  Medication Sig   Calcium  Citrate-Vitamin D  (CITRACAL + D PO) Take 500 mg by mouth 2 (two) times daily.   diltiazem  (CARDIZEM  CD) 180 MG 24 hr capsule Take 1 capsule by mouth once daily   diltiazem  (CARDIZEM ) 60 MG tablet Take 0.5-1 tablets (30-60 mg total) by mouth every 6 (six) hours as needed (Heart rate over 100 as long as systolic blood pressure >100).   flecainide  (TAMBOCOR ) 50 MG tablet Take 1 tablet by mouth twice daily   glucosamine-chondroitin 500-400 MG tablet Take 2 tablets by mouth daily.   Magnesium 250 MG TABS Take 1 tablet by mouth every evening.   Multiple Vitamins-Minerals (CENTRUM SILVER PO) Take 1 tablet by mouth daily.   predniSONE  (DELTASONE ) 20 MG tablet Take 2 tablets (40 mg total) by mouth daily with breakfast for 5 days.   rosuvastatin  (CRESTOR ) 20 MG tablet TAKE 1 TABLET BY MOUTH AT BEDTIME   valACYclovir  (VALTREX ) 500 MG tablet Take 1 tablet (500 mg total) by mouth daily.   XARELTO  20 MG TABS tablet TAKE 1 TABLET BY MOUTH AT BEDTIME   [DISCONTINUED] alendronate  (FOSAMAX ) 70 MG tablet TAKE 1 TABLET BY MOUTH ONCE A WEEK WITH A FULL GLASS OF WATER ON AN EMPTY STOMACH. DO NOT LIE DOWN FOR 30 MINUTES. REMAIN UPRIGHT (Patient not taking: Reported on 01/22/2024)   Facility-Administered Encounter Medications as of 01/22/2024  Medication   [START ON 02/04/2024] denosumab (PROLIA) injection 60 mg    No Known Allergies  Pertinent ROS per HPI, otherwise unremarkable      Objective:  BP (!) 142/69   Pulse 68   Temp 97.9 F (36.6 C)   Ht 5' 5 (1.651 m)   Wt 157 lb 12.8 oz (71.6 kg)   SpO2 97%   BMI 26.26 kg/m    Wt Readings from Last 3  Encounters:  01/22/24 157 lb 12.8 oz (71.6 kg)  12/10/23 158 lb 12.8 oz (72 kg)  10/09/23 158 lb 6.4 oz (71.8 kg)    Physical Exam Vitals and nursing note reviewed.  Constitutional:      General: She is not in acute distress.    Appearance: Normal appearance. She is overweight. She is not ill-appearing, toxic-appearing or diaphoretic.  HENT:     Head: Normocephalic and atraumatic.  Eyes:     Pupils: Pupils are equal, round, and reactive to light.  Cardiovascular:     Rate and Rhythm: Normal rate and regular rhythm.     Heart sounds: Normal heart sounds.  Pulmonary:     Effort: Pulmonary effort is normal.     Breath sounds: Normal breath sounds.  Musculoskeletal:     Cervical back: Neck supple.     Thoracic back: Normal.     Lumbar back: Tenderness present. No swelling, edema, deformity, signs of trauma, lacerations, spasms or bony tenderness. Decreased range of motion. Negative right straight leg raise test and negative left straight leg raise test. No scoliosis.     Right hip: Normal.     Left hip: Tenderness present. No deformity, lacerations, bony tenderness or crepitus. Normal range of motion. Normal strength.     Right upper leg: Normal.     Left upper leg: Tenderness present. No swelling, edema, deformity, lacerations or bony tenderness.     Right lower leg: No edema.     Left lower leg: No edema.  Skin:    General: Skin is warm and dry.     Capillary Refill: Capillary refill takes less than 2 seconds.  Neurological:     General: No focal deficit present.     Mental Status: She is alert and oriented to person, place, and time.     Gait: Gait abnormal (antalgic, using cane).  Psychiatric:        Mood and Affect: Mood normal.        Behavior: Behavior normal. Behavior is cooperative.        Thought Content: Thought content normal.        Judgment: Judgment normal.      Results for orders placed or performed in visit on 12/10/23  CMP14+EGFR   Collection Time:  12/10/23 10:06 AM  Result Value Ref Range   Glucose 99 70 - 99 mg/dL   BUN 17 8 - 27 mg/dL   Creatinine, Ser 9.36 0.57 - 1.00 mg/dL   eGFR 92 >40 fO/fpw/8.26   BUN/Creatinine Ratio 27 12 - 28   Sodium 139 134 - 144 mmol/L   Potassium 4.2 3.5 - 5.2 mmol/L   Chloride 104 96 - 106 mmol/L   CO2 21 20 - 29 mmol/L   Calcium  9.5 8.7 - 10.3 mg/dL   Total Protein 6.8 6.0 - 8.5 g/dL   Albumin 4.3 3.8 - 4.8 g/dL   Globulin, Total 2.5 1.5 - 4.5 g/dL   Bilirubin Total 0.3 0.0 - 1.2 mg/dL   Alkaline Phosphatase 64 44 - 121 IU/L   AST 24 0 - 40 IU/L   ALT 20 0 - 32 IU/L  CBC with Differential/Platelet   Collection Time: 12/10/23 10:06 AM  Result Value Ref Range   WBC 4.9 3.4 - 10.8 x10E3/uL   RBC 4.70 3.77 - 5.28 x10E6/uL   Hemoglobin 14.3 11.1 - 15.9 g/dL   Hematocrit 56.9 65.9 - 46.6 %   MCV 92 79 - 97 fL   MCH 30.4 26.6 - 33.0 pg   MCHC 33.3 31.5 - 35.7 g/dL   RDW 87.7 88.2 - 84.5 %   Platelets 262 150 - 450 x10E3/uL   Neutrophils 57 Not Estab. %   Lymphs 33 Not Estab. %   Monocytes 8 Not Estab. %   Eos 1 Not Estab. %   Basos 1 Not Estab. %   Neutrophils Absolute 2.8 1.4 - 7.0 x10E3/uL   Lymphocytes Absolute 1.6 0.7 - 3.1 x10E3/uL   Monocytes Absolute 0.4 0.1 - 0.9 x10E3/uL  EOS (ABSOLUTE) 0.1 0.0 - 0.4 x10E3/uL   Basophils Absolute 0.0 0.0 - 0.2 x10E3/uL   Immature Granulocytes 0 Not Estab. %   Immature Grans (Abs) 0.0 0.0 - 0.1 x10E3/uL  VITAMIN D  25 Hydroxy (Vit-D Deficiency, Fractures)   Collection Time: 12/10/23 10:06 AM  Result Value Ref Range   Vit D, 25-Hydroxy 42.9 30.0 - 100.0 ng/mL       Pertinent labs & imaging results that were available during my care of the patient were reviewed by me and considered in my medical decision making.  Assessment & Plan:  Kathryn Arnold was seen today for leg pain.  Diagnoses and all orders for this visit:  Leg pain, posterior, left -     Cancel: DG HIP UNILAT WITH PELVIS MIN 4 VIEWS LEFT; Future -     predniSONE  (DELTASONE ) 20 MG  tablet; Take 2 tablets (40 mg total) by mouth daily with breakfast for 5 days.  Left hip pain -     Cancel: DG HIP UNILAT WITH PELVIS MIN 4 VIEWS LEFT; Future -     predniSONE  (DELTASONE ) 20 MG tablet; Take 2 tablets (40 mg total) by mouth daily with breakfast for 5 days.  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 No recent injury or fall. Pain similar to previous fracture pain but no new fracture on x-ray. Significant arthritis in both hips. Pain unrelieved by acetaminophen  or cryotherapy. Previous treatment with methocarbamol , lidocaine  patches, and prednisone . Heat therapy recommended over cryotherapy for arthritic and bone pain. - Order pelvis and hip x-ray to rule out new fracture - Prescribe prednisone , 2 pills daily for 5 days - Advise use of heat therapy for pain relief - Recommend topical analgesics such as Biofreeze or Icy Hot  Osteoporosis with prior pathological fracture Osteoporosis with prior pathological fracture, currently not on antiresorptive therapy. Previously on alendronate  for over ten years. Hesitant to start denosumab due to fear of side effects. Currently taking calcium  and vitamin D  supplements. Discussed benefits of denosumab and its lower side effect profile compared to alendronate . Referral to specialist for further discussion on denosumab. 65 patients on denosumab with no reported problems, highlighting its effectiveness in bone health. - Refer to specialist Kathryn Arnold) for discussion on denosumab and its side effects - Continue calcium  and vitamin D  supplementation          Continue all other maintenance medications.  Follow up plan: Return if symptoms worsen or fail to improve.   Continue healthy lifestyle choices,  including diet (rich in fruits, vegetables, and lean proteins, and low in salt and simple carbohydrates) and exercise (at least 30 minutes of moderate physical activity daily).   The above assessment and management plan was discussed with the patient. The patient verbalized understanding of and has agreed to the management plan. Patient is aware to call the clinic if they develop any new symptoms or if symptoms persist or worsen. Patient is aware when to return to the clinic for a follow-up visit. Patient educated on when it is appropriate to go to the emergency department.   Rosaline Bruns, FNP-C [facility] [phone]

## 2024-01-22 NOTE — Telephone Encounter (Signed)
 Appt made.

## 2024-01-22 NOTE — Telephone Encounter (Signed)
 FYI Only or Action Required?: FYI only for provider.  Patient was last seen in primary care on 12/10/2023 by Lavell Bari LABOR, FNP.  Called Nurse Triage reporting Leg Pain.  Symptoms began today.  Interventions attempted: Rest, hydration, or home remedies.  Symptoms are: unchanged.  Triage Disposition: See HCP Within 4 Hours (Or PCP Triage)  Patient/caregiver understands and will follow disposition?: Yes      Copied from CRM #[id number]. Topic: Clinical - Red Word Triage >> Jan 22, 2024 11:10 AM Tinnie C wrote: Red Word that prompted transfer to Nurse Triage: Pain when walking and getting up in one leg, cannot put pressure on it, no weakness but started immediately upon waking this morning.       Reason for Disposition  [1] Thigh or calf pain AND [2] only 1 side AND [3] present > 1 hour  (Exception: Chronic unchanged pain.)  Answer Assessment - Initial Assessment Questions 1. ONSET: When did the pain start?      This morning  2. LOCATION: Where is the pain located?      Left upper leg radiates to hip when sitting down  3. PAIN: How bad is the pain?    (Scale 1-10; or mild, moderate, severe)     6/10 when on her feet and walking  4. WORK OR EXERCISE: Has there been any recent work or exercise that involved this part of the body?      No 5. CAUSE: What do you think is causing the leg pain?     Unsure  6. OTHER SYMPTOMS: Do you have any other symptoms? (e.g., chest pain, back pain, breathing difficulty, swelling, rash, fever, numbness, weakness)     No  Protocols used: Leg Pain-A-AH

## 2024-01-25 ENCOUNTER — Telehealth: Payer: Self-pay

## 2024-01-25 NOTE — Progress Notes (Signed)
 Care Guide Pharmacy Note  01/25/2024 Name: Kathryn Arnold MRN: [id number] DOB: 1945/10/28  Referred By: Lavell Bari LABOR, FNP Reason for referral: Complex Care Management (Outreach to schedule with Pharm d )   Kathryn Arnold is a 78 y.o. year old female who is a primary care patient of Lavell Bari LABOR, FNP.  Kathryn Arnold Coe was referred to the pharmacist for assistance related to: osteoporosis  An unsuccessful telephone outreach was attempted today to contact the patient who was referred to the pharmacy team for assistance with medication management. Additional attempts will be made to contact the patient.  Jeoffrey Buffalo , RMA     [facility]  [facility], [facility] Guide  Direct Dial: [phone]  Website: [email or web address]

## 2024-01-27 ENCOUNTER — Ambulatory Visit: Payer: Self-pay | Admitting: Family Medicine

## 2024-01-29 NOTE — Progress Notes (Signed)
 Care Guide Pharmacy Note  01/29/2024 Name: Kathryn Arnold MRN: [id number] DOB: 21-Oct-1945  Referred By: Lavell Bari LABOR, FNP Reason for referral: Complex Care Management (Outreach to schedule with Pharm d )   [name] is a 78 y.o. year old female who is a primary care patient of Lavell Bari LABOR, FNP.  Orlean LITTIE Coe was referred to the pharmacist for assistance related to: osteoporosis  Successful contact was made with the patient to discuss pharmacy services.  Patient declines engagement at this time. Contact information was provided to the patient should they wish to reach out for assistance at a later time.  Jeoffrey Buffalo , RMA     [facility]  [facility], [facility] Guide  Direct Dial: [phone]  Website: [email or web address]

## 2024-02-06 ENCOUNTER — Ambulatory Visit: Payer: Self-pay

## 2024-02-06 NOTE — Telephone Encounter (Signed)
 Apt scheduled.

## 2024-02-06 NOTE — Telephone Encounter (Signed)
 FYI Only or Action Required?: FYI only for provider.  Patient was last seen in primary care on 01/22/2024 by Kathryn Rock HERO, FNP.  Called Nurse Triage reporting Insect Bite.  Symptoms began yesterday.  Interventions attempted: OTC medications: Anti itch meds.  Symptoms are: gradually worsening.  Triage Disposition: See Physician Within 24 Hours  Patient/caregiver understands and will follow disposition?: Yes       Copied from CRM #[id number]. Topic: Clinical - Red Word Triage >> Feb 06, 2024 12:56 PM Tobias L wrote: Red Word that prompted transfer to Nurse Triage: possible spider bite, back of leg, burning and itching Reason for Disposition  [1] Red or very tender (to touch) area AND [2] started over 24 hours after the bite  Answer Assessment - Initial Assessment Questions 1. TYPE of INSECT: What type of insect was it?      Possible spider but is unsure.  2. ONSET: When did you get bitten?      Possible two days ago, symptoms started yesterday  3. LOCATION: Where is the insect bite located?      Back of L leg  4. REDNESS: Is the area red or pink? If Yes, ask: What size is the area of redness? (inches or cm). When did the redness start?     Redness looks like a round circle with something in the middle.  5. PAIN: Is there any pain? If Yes, ask: How bad is the pain? (Scale 0-10; or none, mild, moderate, severe)     3 or 4 out of 10  6. ITCHING: Does it itch? If Yes, ask: How bad is the itch?      Moderate  7. SWELLING: How big is the swelling? (e.g., inches, cm, or compare to coins)     Yes, mild swelling  8. OTHER SYMPTOMS: Do you have any other symptoms?  (e.g., difficulty breathing, fever, hives)     Denies  Using otc itch meds for symptoms with some relief.  Protocols used: Insect Bite-A-AH

## 2024-02-07 ENCOUNTER — Ambulatory Visit: Admitting: Nurse Practitioner

## 2024-02-07 ENCOUNTER — Encounter: Payer: Self-pay | Admitting: Nurse Practitioner

## 2024-02-07 VITALS — BP 135/72 | HR 65 | Temp 97.7°F | Ht 65.0 in | Wt 158.8 lb

## 2024-02-07 DIAGNOSIS — R110 Nausea: Secondary | ICD-10-CM | POA: Diagnosis not present

## 2024-02-07 DIAGNOSIS — W57XXXA Bitten or stung by nonvenomous insect and other nonvenomous arthropods, initial encounter: Secondary | ICD-10-CM | POA: Diagnosis not present

## 2024-02-07 DIAGNOSIS — S80862A Insect bite (nonvenomous), left lower leg, initial encounter: Secondary | ICD-10-CM | POA: Diagnosis not present

## 2024-02-07 MED ORDER — ONDANSETRON HCL 4 MG PO TABS: 4.0000 mg | ORAL_TABLET | Freq: Three times a day (TID) | ORAL | 0 refills | Status: DC | PRN

## 2024-02-07 MED ORDER — CEPHALEXIN 500 MG PO CAPS: 500.0000 mg | ORAL_CAPSULE | Freq: Four times a day (QID) | ORAL | 0 refills | Status: DC

## 2024-02-07 NOTE — Progress Notes (Signed)
Subjective:  Patient ID: Kathryn Arnold, female    DOB: Apr 01, 1946, 78 y.o.   MRN: [id number]  Patient Care Team: Lavell Bari LABOR, FNP as PCP - General (Nurse Practitioner)   Chief Complaint:  Insect Bite (Insect bite on back of left leg, noticed 2 days ago. Burning )   HPI: Kathryn Arnold is a 78 y.o. female presenting on 02/07/2024 for Insect Bite (Insect bite on back of left leg, noticed 2 days ago. Burning )   Discussed the use of AI scribe software for clinical note transcription with the patient, who gave verbal consent to proceed.  History of Present Illness Kathryn Arnold is a 78 year old female who presents with a burning sensation and nausea following an insect bite on her left leg.  Two days ago, she noticed an insect bite on her left leg, which has developed a small nodule in the center and does not resemble a mosquito bite. The area is associated with a burning sensation and pain.  She began experiencing nausea yesterday and felt queasy this morning, which is unusual for her as she is typically not nauseated. Last night, she felt as though she might vomit but did not. No fever or headache is present.  She spends time outside daily, which she attributes to getting vitamin D .      Relevant past medical, surgical, family, and social history reviewed and updated as indicated.  Allergies and medications reviewed and updated. Data reviewed: Chart in Epic.   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   HSV-2 (herpes simplex virus 2) infection    Hypercholesteremia    Hyperlipidemia with target LDL less than 100    Hypertension    Osteoporosis, unspecified    Last DEXA 10/2011    Paroxysmal atrial fibrillation (HCC)    s/p ablation Afib and flutter 11/03/14   PONV (postoperative nausea and  vomiting)    Vitamin D  deficiency     Past Surgical History:  Procedure Laterality Date   ATRIAL FIBRILLATION ABLATION N/A 10/31/2016   Procedure: Atrial Fibrillation Ablation;  Surgeon: Kelsie Agent, MD;  Location: [facility] INVASIVE CV LAB;  Service: Cardiovascular;  Laterality: N/A;   BREAST LUMPECTOMY Left    DILATION AND CURETTAGE OF UTERUS     ELECTROPHYSIOLOGIC STUDY N/A 11/03/2014   Procedure: Atrial Fibrillation Ablation;  Surgeon: Agent Kelsie, MD;  Location: [facility] INVASIVE CV LAB;  Service: Cardiovascular;  Laterality: N/A;   implantable loop recorder placement  03/08/2018   Medtronic Reveal LINQ implantation by Dr Kelsie for afib management post ablation   LAPAROSCOPY ABDOMEN DIAGNOSTIC  1990s   LOOP RECORDER REMOVAL  09/17/2019   MDT Reveal LINQ removed in office by Dr Kelsie   TEE WITHOUT CARDIOVERSION N/A 10/30/2016   Procedure: TRANSESOPHAGEAL ECHOCARDIOGRAM (TEE);  Surgeon: Francyne Headland, MD;  Location: [facility] ENDOSCOPY;  Service: Cardiovascular;  Laterality: N/A;   TUBAL LIGATION  1978    Social History   Socioeconomic History   Marital status: Married    Spouse name: Not on file   Number of children: Not on file   Years of education: Not on file   Highest education level: Not on file  Occupational History   Not on file  Tobacco Use   Smoking status: Never  Smokeless tobacco: Never  Vaping Use   Vaping status: Never Used  Substance and Sexual Activity   Alcohol use: No   Drug use: No   Sexual activity: Not Currently    Birth control/protection: None  Other Topics Concern   Not on file  Social History Narrative   Lives in [place] with spouse.   Social Drivers of Corporate investment banker Strain: Not on file  Food Insecurity: Not on file  Transportation Needs: Not on file  Physical Activity: Not on file  Stress: Not on file  Social Connections: Not on file  Intimate Partner Violence: Not At Risk (03/13/2018)   Humiliation, Afraid, Rape, and Kick  questionnaire    Fear of Current or Ex-Partner: No    Emotionally Abused: No    Physically Abused: No    Sexually Abused: No    Outpatient Encounter Medications as of 02/07/2024  Medication Sig   Calcium  Citrate-Vitamin D  (CITRACAL + D PO) Take 500 mg by mouth 2 (two) times daily.   cephALEXin  (KEFLEX ) 500 MG capsule Take 1 capsule (500 mg total) by mouth 4 (four) times daily.   diltiazem  (CARDIZEM  CD) 180 MG 24 hr capsule Take 1 capsule by mouth once daily   diltiazem  (CARDIZEM ) 60 MG tablet Take 0.5-1 tablets (30-60 mg total) by mouth every 6 (six) hours as needed (Heart rate over 100 as long as systolic blood pressure >100).   flecainide  (TAMBOCOR ) 50 MG tablet Take 1 tablet by mouth twice daily   glucosamine-chondroitin 500-400 MG tablet Take 2 tablets by mouth daily.   Magnesium 250 MG TABS Take 1 tablet by mouth every evening.   Multiple Vitamins-Minerals (CENTRUM SILVER PO) Take 1 tablet by mouth daily.   ondansetron  (ZOFRAN ) 4 MG tablet Take 1 tablet (4 mg total) by mouth every 8 (eight) hours as needed for nausea or vomiting.   rosuvastatin  (CRESTOR ) 20 MG tablet TAKE 1 TABLET BY MOUTH AT BEDTIME   valACYclovir  (VALTREX ) 500 MG tablet Take 1 tablet (500 mg total) by mouth daily.   XARELTO  20 MG TABS tablet TAKE 1 TABLET BY MOUTH AT BEDTIME   Facility-Administered Encounter Medications as of 02/07/2024  Medication   denosumab (PROLIA) injection 60 mg    No Known Allergies  Pertinent ROS per HPI, otherwise unremarkable      Objective:  BP 135/72   Pulse 65   Temp 97.7 F (36.5 C) (Temporal)   Ht 5' 5 (1.651 m)   Wt 158 lb 12.8 oz (72 kg)   SpO2 97%   BMI 26.43 kg/m    Wt Readings from Last 3 Encounters:  02/07/24 158 lb 12.8 oz (72 kg)  01/22/24 157 lb 12.8 oz (71.6 kg)  12/10/23 158 lb 12.8 oz (72 kg)    Physical Exam Vitals and nursing note reviewed.  Constitutional:      General: She is not in acute distress. HENT:     Head: Normocephalic and  atraumatic.     Nose: Nose normal.  Eyes:     Extraocular Movements: Extraocular movements intact.     Conjunctiva/sclera: Conjunctivae normal.     Pupils: Pupils are equal, round, and reactive to light.  Cardiovascular:     Heart sounds: Normal heart sounds.  Pulmonary:     Effort: Pulmonary effort is normal.     Breath sounds: Normal breath sounds.  Musculoskeletal:        General: Normal range of motion.     Right lower leg: No edema.  Left lower leg: No edema.  Skin:    General: Skin is warm and dry.     Findings: Erythema present.     Comments: Left leg  Neurological:     Mental Status: She is alert and oriented to person, place, and time.  Psychiatric:        Mood and Affect: Mood normal.        Behavior: Behavior normal.        Thought Content: Thought content normal.        Judgment: Judgment normal.    Physical Exam SKIN: Insect bite with a knot in the middle on the left leg.     Results for orders placed or performed in visit on 12/10/23  CMP14+EGFR   Collection Time: 12/10/23 10:06 AM  Result Value Ref Range   Glucose 99 70 - 99 mg/dL   BUN 17 8 - 27 mg/dL   Creatinine, Ser 9.36 0.57 - 1.00 mg/dL   eGFR 92 >40 fO/fpw/8.26   BUN/Creatinine Ratio 27 12 - 28   Sodium 139 134 - 144 mmol/L   Potassium 4.2 3.5 - 5.2 mmol/L   Chloride 104 96 - 106 mmol/L   CO2 21 20 - 29 mmol/L   Calcium  9.5 8.7 - 10.3 mg/dL   Total Protein 6.8 6.0 - 8.5 g/dL   Albumin 4.3 3.8 - 4.8 g/dL   Globulin, Total 2.5 1.5 - 4.5 g/dL   Bilirubin Total 0.3 0.0 - 1.2 mg/dL   Alkaline Phosphatase 64 44 - 121 IU/L   AST 24 0 - 40 IU/L   ALT 20 0 - 32 IU/L  CBC with Differential/Platelet   Collection Time: 12/10/23 10:06 AM  Result Value Ref Range   WBC 4.9 3.4 - 10.8 x10E3/uL   RBC 4.70 3.77 - 5.28 x10E6/uL   Hemoglobin 14.3 11.1 - 15.9 g/dL   Hematocrit 56.9 65.9 - 46.6 %   MCV 92 79 - 97 fL   MCH 30.4 26.6 - 33.0 pg   MCHC 33.3 31.5 - 35.7 g/dL   RDW 87.7 88.2 - 84.5 %    Platelets 262 150 - 450 x10E3/uL   Neutrophils 57 Not Estab. %   Lymphs 33 Not Estab. %   Monocytes 8 Not Estab. %   Eos 1 Not Estab. %   Basos 1 Not Estab. %   Neutrophils Absolute 2.8 1.4 - 7.0 x10E3/uL   Lymphocytes Absolute 1.6 0.7 - 3.1 x10E3/uL   Monocytes Absolute 0.4 0.1 - 0.9 x10E3/uL   EOS (ABSOLUTE) 0.1 0.0 - 0.4 x10E3/uL   Basophils Absolute 0.0 0.0 - 0.2 x10E3/uL   Immature Granulocytes 0 Not Estab. %   Immature Grans (Abs) 0.0 0.0 - 0.1 x10E3/uL  VITAMIN D  25 Hydroxy (Vit-D Deficiency, Fractures)   Collection Time: 12/10/23 10:06 AM  Result Value Ref Range   Vit D, 25-Hydroxy 42.9 30.0 - 100.0 ng/mL       Pertinent labs & imaging results that were available during my care of the patient were reviewed by me and considered in my medical decision making.  Assessment & Plan:  Kathryn Arnold was seen today for insect bite.  Diagnoses and all orders for this visit:  Insect bite of left lower leg, initial encounter -     cephALEXin  (KEFLEX ) 500 MG capsule; Take 1 capsule (500 mg total) by mouth 4 (four) times daily.  Nausea -     cephALEXin  (KEFLEX ) 500 MG capsule; Take 1 capsule (500 mg total) by mouth 4 (four)  times daily. -     ondansetron  (ZOFRAN ) 4 MG tablet; Take 1 tablet (4 mg total) by mouth every 8 (eight) hours as needed for nausea or vomiting.     Assessment and Plan Kathryn Arnold is a 78 year old Caucasian female seen today for insect bite, no acute distress Assessment & Plan Insect bite (nonvenomous) of left lower leg Insect bite with localized burning, no systemic infection. - Prescribed Cephalexin  500 mg twice daily for 7 days. - Nausea Zofran  4 mg as needed every 8 hours  Non-Pharmacological Plan for Insect Bite -Clean area with soap and water. -Apply cold compress for 10-15 min to reduce swelling and pain. -Avoid scratching; keep nails short and area covered. -Elevate if swollen. -Monitor for infection or allergic reaction and seek care if symptoms  worsen.    Continue all other maintenance medications.  Follow up plan: Return if symptoms worsen or fail to improve.   Continue healthy lifestyle choices, including diet (rich in fruits, vegetables, and lean proteins, and low in salt and simple carbohydrates) and exercise (at least 30 minutes of moderate physical activity daily).  Educational handout given for    Insect Bite, Adult An insect bite can make your skin red, itchy, and swollen. An insect bite is different from an insect sting, which happens when an insect injects poison (venom) into the skin. Some insects can spread disease to people through a bite. However, most insect bites do not lead to disease and are not serious. What are the causes? Insects may bite for a variety of reasons, including: Hunger. To defend themselves. Insects that bite include: Spiders. Mosquitoes and flies. Ticks and fleas. Ants. Kissing bugs. Chiggers. What are the signs or symptoms? In many cases, symptoms last for 2-4 days. However, itching can last up to 10 days. Symptoms include: Itching or pain in the bite area. Redness and swelling in the bite area. An open wound (skin ulcer). In rare cases, a person may have a severe allergic reaction (anaphylactic reaction) to a bite. Symptoms of an anaphylactic reaction may include: Feeling warm in the face (flushed). This may include redness. Itchy, red, swollen areas of skin (hives). Swelling of the eyes, lips, face, mouth, tongue, or throat. Wheezing or difficulty breathing, speaking, or swallowing. Dizziness, light-headedness, or fainting. Abdominal symptoms like cramping, nausea, vomiting, or diarrhea. How is this diagnosed? This condition is usually diagnosed based on symptoms and a physical exam. During the exam, your health care provider will look at the bite and ask you what kind of insect bit you. How is this treated? Most insect bites are not serious. Symptoms often go away on their own  and treatment is not usually needed. When treatment is recommended, it may include: Applying ice to the affected area. Applying steroid or other anti-itch creams, like calamine lotion, to the bite area. Medicines called antihistamines to reduce itching. You may also need: A tetanus shot if you are not up to date. Antibiotic cream or an oral antibiotic if the bite becomes infected (this is uncommon). Follow these instructions at home: Bite area care  Do not scratch the bite area. It may help to cover the bite area with a bandage or close-fitting clothing. Keep the bite area clean and dry. Wash it every day with soap and water as told by your health care provider. Check the bite area every day for signs of infection. Check for: More redness, swelling, or pain. Fluid or blood. Warmth. Pus or a bad smell. Managing pain, itching, and  swelling  You may apply cortisone cream, calamine lotion, or a paste made of baking soda and water to the bite area as told by your health care provider. If directed, put ice on the bite area. To do this: Put ice in a plastic bag. Place a towel between your skin and the bag. Leave the ice on for 20 minutes, 2-3 times a day. If your skin turns bright red, remove the ice right away to prevent skin damage. The risk of skin damage is higher if you cannot feel pain, heat, or cold. General instructions Apply or take over-the-counter and prescription medicine only as told by your health care provider. If you were prescribed antibiotics, take or apply them as told by your health care provider. Do not stop using the antibiotic even if you start to feel better. How is this prevented? To help reduce your risk of insect bites: When you are outdoors, wear clothing that covers your arms and legs. This is especially important in the early morning and evening. Use insect repellent. The best insect repellents contain DEET, picaridin, oil of lemon eucalyptus (OLE), or  IR3535. Consider spraying your clothing with a pesticide called permethrin. Permethrin helps prevent insect bites. It works for several weeks and for up to 5-6 clothing washes. Do not apply permethrin directly to the skin. If your home windows do not have screens, consider installing them. If you will be sleeping in an area where there are mosquitoes, consider covering your sleeping area with a mosquito net. Contact a health care provider if: Your bite area has signs of infection, such as: More redness, swelling, or pain. Fluid or blood. Warmth. Pus or a bad smell. You have a fever. Get help right away if: You have a rash. You have muscle or joint pain. You feel unusually tired or weak. You have neck pain or a headache. You develop symptoms of an anaphylactic reaction. These may include: Swelling of the eyes, lips, face, mouth, tongue, or throat. Flushed skin or hives. Wheezing. Difficulty breathing, speaking, or swallowing. Dizziness, light-headedness, or fainting. Abdominal pain, cramping, vomiting, or diarrhea. These symptoms may be an emergency. Get help right away. Call 911. Do not wait to see if the symptoms will go away. Do not drive yourself to the hospital. Summary An insect bite can make your skin red, itchy, and swollen. Treatment is usually not needed. Symptoms often go away on their own. When treatment is recommended, it may involve taking medicine, applying medicine to the area, or applying ice. Apply or take over-the-counter and prescription medicines only as told by your health care provider. Use insect repellent to help prevent insect bites. Contact a health care provider if your bite area has signs of infection. This information is not intended to replace advice given to you by your health care provider. Make sure you discuss any questions you have with your health care provider. Document Revised: 07/20/2021 Document Reviewed: 07/05/2021 Elsevier Patient Education   2024 Elsevier Inc.    The above assessment and management plan was discussed with the patient. The patient verbalized understanding of and has agreed to the management plan. Patient is aware to call the clinic if they develop any new symptoms or if symptoms persist or worsen. Patient is aware when to return to the clinic for a follow-up visit. Patient educated on when it is appropriate to go to the emergency department.   Kathryn Hodgkins [place] Thompson, DNP [facility] [address] [place], [place] [address] [phone]   

## 2024-02-08 ENCOUNTER — Ambulatory Visit: Payer: Self-pay

## 2024-02-08 NOTE — Telephone Encounter (Signed)
 FYI Only or Action Required?: FYI only for provider.  Patient was last seen in primary care on 02/07/2024 by Deitra Morton Sebastian Nena, NP.  Called Nurse Triage reporting Medication Reaction.  Symptoms began today.  Interventions attempted: Nothing.  Symptoms are: unchanged.  Triage Disposition: Information or Advice Only Call  Patient/caregiver understands and will follow disposition?: Yes  Copied from CRM #[id number]. Topic: Clinical - Red Word Triage >> Feb 08, 2024  4:55 PM Nathanel BROCKS wrote: Red Word that prompted transfer to Nurse Triage: had bug bite on back of leg.. she was given  cephALEXin  (KEFLEX ) 500 MG capsule pt is not doing well at all. She said it has almost knocked her out. There is no way that she can take it and she feels horrible after the 1 pill, Reason for Disposition  Caller has medicine question only, adult not sick, AND triager answers question  Answer Assessment - Initial Assessment Questions Pt states she was seen for bite to leg and rx keflex . Took one dose of Keflex  and has felt terrible all day. States she cannot take anymore of it. RN recommended UC/ED, pt states she wants to wait and see how she feels tomorrow. States her bite feels and looks better as well. RN educated on ED escalation sxs. Pt verbalized understanding.   1. NAME of MEDICINE: What medicine(s) are you calling about?     keflex  2. QUESTION: What is your question? (e.g., double dose of medicine, side effect)     Will there be side effects if I stop taking it 3. PRESCRIBER: Who prescribed the medicine? Reason: if prescribed by specialist, call should be referred to that group.     [place] Thompson, Sandra, NP 4. SYMPTOMS: Do you have any symptoms? If Yes, ask: What symptoms are you having?  How bad are the symptoms (e.g., mild, moderate, severe)     Feels terrible  Protocols used: Medication Question Call-A-AH

## 2024-02-11 NOTE — Telephone Encounter (Signed)
 Pt states she has never taken keflex  before and after taking it she felt very dizzy and weak. Pt states she stopped taking the medication but the spot on her leg has started to heal. Pt would like to wait and see how the spot does before taking another antibiotic. Advised pt to call back if symptoms become worse. Pt verbalized understanding

## 2024-02-24 ENCOUNTER — Other Ambulatory Visit: Payer: Self-pay | Admitting: *Deleted

## 2024-02-24 DIAGNOSIS — I480 Paroxysmal atrial fibrillation: Secondary | ICD-10-CM

## 2024-02-24 DIAGNOSIS — E785 Hyperlipidemia, unspecified: Secondary | ICD-10-CM

## 2024-02-24 DIAGNOSIS — I10 Essential (primary) hypertension: Secondary | ICD-10-CM

## 2024-02-25 ENCOUNTER — Other Ambulatory Visit: Payer: Self-pay | Admitting: *Deleted

## 2024-02-25 DIAGNOSIS — E785 Hyperlipidemia, unspecified: Secondary | ICD-10-CM

## 2024-02-26 ENCOUNTER — Ambulatory Visit: Payer: Self-pay

## 2024-02-26 NOTE — Telephone Encounter (Signed)
Noted  -LS

## 2024-02-26 NOTE — Progress Notes (Addendum)
 "    Subjective:  Patient ID: Kathryn Arnold, female    DOB: April 05, 1946, 78 y.o.   MRN: [id number]  Patient Care Team: Lavell Bari LABOR, FNP as PCP - General (Nurse Practitioner)   Chief Complaint:  Neck Pain (Left side neck pain and swelling started Monday )   HPI: Kathryn Arnold is a 78 y.o. female presenting on 02/27/2024 for Neck Pain (Left side neck pain and swelling started Monday )   Discussed the use of AI scribe software for clinical note transcription with the patient, who gave verbal consent to proceed.  History of Present Illness Kathryn Arnold is a 78 year old female who presents with left-sided neck tenderness and ear pressure.  She has been experiencing tenderness on the left side of her neck and pressure in her left ear since Monday. The area is described as 'real tender' with a sensation of pressure in the ear. She has attempted exercises to drain fluid and noticed some drainage, but the tenderness remains.  No fever, but she feels achy. A sore throat began yesterday, and she has been gargling warm salt water for relief. She suspects an ear or sinus infection due to these symptoms.  She uses Flonase  at home but is unsure of its expiration date. Additionally, she is out of her Crestor  medication, which is 20 mg, and has not received a response from the pharmacy regarding a refill request made on Monday.  She has hx of hyperlipidemia. Currently taking Crestor  (rosuvastatin ) 20 mg daily as prescribed. Reports good adherence to medication and denies any muscle pain, weakness, or other side effects. No chest pain, shortness of breath, or dizziness. Follows a heart-healthy diet and engages in moderate physical activity. Last lipid panel reviewed, and patient continues to be monitored for therapeutic response and tolerance.   Relevant past medical, surgical, family, and social history reviewed and updated as indicated.  Allergies and medications reviewed and updated. Data  reviewed: Chart in Epic.   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   HSV-2 (herpes simplex virus 2) infection    Hypercholesteremia    Hyperlipidemia with target LDL less than 100    Hypertension    Osteoporosis, unspecified    Last DEXA 10/2011    Paroxysmal atrial fibrillation (HCC)    s/p ablation Afib and flutter 11/03/14   PONV (postoperative nausea and vomiting)    Vitamin D  deficiency     Past Surgical History:  Procedure Laterality Date   ATRIAL FIBRILLATION ABLATION N/A 10/31/2016   Procedure: Atrial Fibrillation Ablation;  Surgeon: Kelsie Agent, MD;  Location: [facility] INVASIVE CV LAB;  Service: Cardiovascular;  Laterality: N/A;   BREAST LUMPECTOMY Left    DILATION AND CURETTAGE OF UTERUS     ELECTROPHYSIOLOGIC STUDY N/A 11/03/2014   Procedure: Atrial Fibrillation Ablation;  Surgeon: Agent Kelsie, MD;  Location: [facility] INVASIVE CV LAB;  Service: Cardiovascular;  Laterality: N/A;   implantable loop recorder placement  03/08/2018   [employer] Reveal LINQ implantation by Dr Kelsie for afib management post ablation   LAPAROSCOPY ABDOMEN DIAGNOSTIC  1990s   LOOP RECORDER REMOVAL  09/17/2019   MDT Reveal LINQ removed in office by Dr Kelsie   TEE WITHOUT CARDIOVERSION N/A 10/30/2016   Procedure: TRANSESOPHAGEAL ECHOCARDIOGRAM (TEE);  Surgeon: Francyne Headland,  MD;  Location: [facility] ENDOSCOPY;  Service: Cardiovascular;  Laterality: N/A;   TUBAL LIGATION  1978    Social History   Socioeconomic History   Marital status: Married    Spouse name: Not on file   Number of children: Not on file   Years of education: Not on file   Highest education level: Not on file  Occupational History   Not on file  Tobacco Use   Smoking status: Never   Smokeless tobacco: Never  Vaping Use   Vaping status: Never Used  Substance and  Sexual Activity   Alcohol use: No   Drug use: No   Sexual activity: Not Currently    Birth control/protection: None  Other Topics Concern   Not on file  Social History Narrative   Lives in [place] with spouse.   Social Drivers of Corporate Investment Banker Strain: Not on file  Food Insecurity: Not on file  Transportation Needs: Not on file  Physical Activity: Not on file  Stress: Not on file  Social Connections: Not on file  Intimate Partner Violence: Not At Risk (03/13/2018)   Humiliation, Afraid, Rape, and Kick questionnaire    Fear of Current or Ex-Partner: No    Emotionally Abused: No    Physically Abused: No    Sexually Abused: No    Outpatient Encounter Medications as of 02/27/2024  Medication Sig   Calcium  Citrate-Vitamin D  (CITRACAL + D PO) Take 500 mg by mouth 2 (two) times daily.   diltiazem  (CARDIZEM  CD) 180 MG 24 hr capsule Take 1 capsule by mouth once daily   diltiazem  (CARDIZEM ) 60 MG tablet Take 0.5-1 tablets (30-60 mg total) by mouth every 6 (six) hours as needed (Heart rate over 100 as long as systolic blood pressure >100).   flecainide  (TAMBOCOR ) 50 MG tablet Take 1 tablet by mouth twice daily   glucosamine-chondroitin 500-400 MG tablet Take 2 tablets by mouth daily.   Magnesium 250 MG TABS Take 1 tablet by mouth every evening.   Multiple Vitamins-Minerals (CENTRUM SILVER PO) Take 1 tablet by mouth daily.   ondansetron  (ZOFRAN ) 4 MG tablet Take 1 tablet (4 mg total) by mouth every 8 (eight) hours as needed for nausea or vomiting.   rosuvastatin  (CRESTOR ) 20 MG tablet TAKE 1 TABLET BY MOUTH AT BEDTIME   valACYclovir  (VALTREX ) 500 MG tablet Take 1 tablet (500 mg total) by mouth daily.   XARELTO  20 MG TABS tablet TAKE 1 TABLET BY MOUTH AT BEDTIME   [DISCONTINUED] cephALEXin  (KEFLEX ) 500 MG capsule Take 1 capsule (500 mg total) by mouth 4 (four) times daily. (Patient not taking: Reported on 02/27/2024)   Facility-Administered Encounter Medications as of 02/27/2024   Medication   denosumab  (PROLIA ) injection 60 mg    No Known Allergies  Review of Systems  Constitutional:  Negative for chills and fever.  HENT:  Positive for congestion, ear discharge and sore throat.        Relieved with warm salt water gargle  Respiratory:  Negative for cough, shortness of breath and wheezing.   Cardiovascular:  Negative for chest pain and leg swelling.  Gastrointestinal:  Negative for abdominal pain, blood in stool, diarrhea, nausea and vomiting.  Skin:  Negative for rash.  Neurological:  Negative for dizziness and headaches.         Objective:  BP (!) 147/73   Pulse 67   Temp 97.7 F (36.5 C) (Temporal)   Ht 5' 5 (1.651 m)   Wt 162 lb  12.8 oz (73.8 kg)   SpO2 96%   BMI 27.09 kg/m    Wt Readings from Last 3 Encounters:  02/27/24 162 lb 12.8 oz (73.8 kg)  02/07/24 158 lb 12.8 oz (72 kg)  01/22/24 157 lb 12.8 oz (71.6 kg)    Physical Exam Vitals and nursing note reviewed.  Constitutional:      General: She is not in acute distress. HENT:     Head: Normocephalic and atraumatic.     Right Ear: Hearing, tympanic membrane, ear canal and external ear normal.     Left Ear: Swelling and tenderness present. Tympanic membrane is erythematous.     Nose: Congestion present.     Right Turbinates: Swollen.     Left Turbinates: Swollen.     Right Sinus: Maxillary sinus tenderness present.     Left Sinus: Maxillary sinus tenderness present.     Mouth/Throat:     Mouth: Mucous membranes are moist.  Eyes:     General: No scleral icterus.    Extraocular Movements: Extraocular movements intact.     Conjunctiva/sclera: Conjunctivae normal.     Pupils: Pupils are equal, round, and reactive to light.  Cardiovascular:     Heart sounds: Normal heart sounds.  Pulmonary:     Effort: Pulmonary effort is normal.     Breath sounds: Normal breath sounds.  Musculoskeletal:        General: Normal range of motion.     Right lower leg: No edema.     Left lower leg:  No edema.  Skin:    General: Skin is warm and dry.     Findings: No lesion or rash.  Neurological:     Mental Status: She is alert and oriented to person, place, and time. Mental status is at baseline.  Psychiatric:        Mood and Affect: Mood normal.        Behavior: Behavior normal.        Thought Content: Thought content normal.        Judgment: Judgment normal.    Physical Exam HEENT: Mild left ear infection.     Results for orders placed or performed in visit on 12/10/23  CMP14+EGFR   Collection Time: 12/10/23 10:06 AM  Result Value Ref Range   Glucose 99 70 - 99 mg/dL   BUN 17 8 - 27 mg/dL   Creatinine, Ser 9.36 0.57 - 1.00 mg/dL   eGFR 92 >40 fO/fpw/8.26   BUN/Creatinine Ratio 27 12 - 28   Sodium 139 134 - 144 mmol/L   Potassium 4.2 3.5 - 5.2 mmol/L   Chloride 104 96 - 106 mmol/L   CO2 21 20 - 29 mmol/L   Calcium  9.5 8.7 - 10.3 mg/dL   Total Protein 6.8 6.0 - 8.5 g/dL   Albumin 4.3 3.8 - 4.8 g/dL   Globulin, Total 2.5 1.5 - 4.5 g/dL   Bilirubin Total 0.3 0.0 - 1.2 mg/dL   Alkaline Phosphatase 64 44 - 121 IU/L   AST 24 0 - 40 IU/L   ALT 20 0 - 32 IU/L  CBC with Differential/Platelet   Collection Time: 12/10/23 10:06 AM  Result Value Ref Range   WBC 4.9 3.4 - 10.8 x10E3/uL   RBC 4.70 3.77 - 5.28 x10E6/uL   Hemoglobin 14.3 11.1 - 15.9 g/dL   Hematocrit 56.9 65.9 - 46.6 %   MCV 92 79 - 97 fL   MCH 30.4 26.6 - 33.0 pg   MCHC 33.3 31.5 -  35.7 g/dL   RDW 87.7 88.2 - 84.5 %   Platelets 262 150 - 450 x10E3/uL   Neutrophils 57 Not Estab. %   Lymphs 33 Not Estab. %   Monocytes 8 Not Estab. %   Eos 1 Not Estab. %   Basos 1 Not Estab. %   Neutrophils Absolute 2.8 1.4 - 7.0 x10E3/uL   Lymphocytes Absolute 1.6 0.7 - 3.1 x10E3/uL   Monocytes Absolute 0.4 0.1 - 0.9 x10E3/uL   EOS (ABSOLUTE) 0.1 0.0 - 0.4 x10E3/uL   Basophils Absolute 0.0 0.0 - 0.2 x10E3/uL   Immature Granulocytes 0 Not Estab. %   Immature Grans (Abs) 0.0 0.0 - 0.1 x10E3/uL  VITAMIN D  25 Hydroxy  (Vit-D Deficiency, Fractures)   Collection Time: 12/10/23 10:06 AM  Result Value Ref Range   Vit D, 25-Hydroxy 42.9 30.0 - 100.0 ng/mL       Pertinent labs & imaging results that were available during my care of the patient were reviewed by me and considered in my medical decision making.  Assessment & Plan:  There are no diagnoses linked to this encounter.   Assessment and Plan Kathryn Arnold is a 78 year old Caucasian female seen today for otitis media, no acute distress Assessment & Plan Acute suppurative otitis media, left ear Mild left ear infection with tenderness and pressure. Lymphadenopathy likely due to infection. - Prescribed Omnicef  300 mg, one tablet twice daily for seven days. - Advised to complete full antibiotic course even if symptoms improve.  Nasal congestion Sinus drainage and ear pressure suggest nasal congestion. Possible sinus infection considered. - Prescribed Flonase  nasal spray.  Hyperlipidemia Out of refills for Rosuvastatin  20 mg. Medication management required. - Refilled Rosuvastatin  20 mg.      Continue all other maintenance medications.  Follow up plan: No follow-ups on file.   Continue healthy lifestyle choices, including diet (rich in fruits, vegetables, and lean proteins, and low in salt and simple carbohydrates) and exercise (at least 30 minutes of moderate physical activity daily).  Educational handout given for    Clinical References  Dyslipidemia Dyslipidemia is an imbalance of waxy, fat-like substances (lipids) in the blood. The body needs lipids in small amounts. Dyslipidemia often involves a high level of cholesterol or triglycerides, which are types of lipids. Common forms of dyslipidemia include: High levels of LDL cholesterol. LDL is the type of cholesterol that causes fatty deposits (plaques) to build up in the blood vessels that carry blood away from the heart (arteries). Low levels of HDL cholesterol. HDL cholesterol is the type  of cholesterol that protects against heart disease. High levels of HDL remove the LDL buildup from arteries. High levels of triglycerides. Triglycerides are a fatty substance in the blood that is linked to a buildup of plaques in the arteries. What are the causes? There are two main types of dyslipidemia: primary and secondary. Primary dyslipidemia is caused by changes (mutations) in genes that are passed down through families (inherited). These mutations cause several types of dyslipidemia. Secondary dyslipidemia may be caused by various risk factors that can lead to the disease, such as lifestyle choices and certain medical conditions. What increases the risk? You are more likely to develop this condition if you are an older man or if you are a woman who has gone through menopause. Other risk factors include: Having a family history of dyslipidemia. Taking certain medicines, including birth control pills, steroids, some diuretics, and beta-blockers. Eating a diet high in saturated fat. Smoking cigarettes or excessive alcohol intake.  Having certain medical conditions such as diabetes, polycystic ovary syndrome (PCOS), kidney disease, liver disease, or hypothyroidism. Not exercising regularly. Being overweight or obese with too much belly fat. What are the signs or symptoms? In most cases, dyslipidemia does not usually cause any symptoms. In severe cases, very high lipid levels can cause: Fatty bumps under the skin (xanthomas). A white or gray ring around the black center (pupil) of the eye. Very high triglyceride levels can cause inflammation of the pancreas (pancreatitis). How is this diagnosed? Your health care provider may diagnose dyslipidemia based on a routine blood test (fasting blood test). Because most people do not have symptoms of the condition, this blood testing (lipid profile) is done on adults age 79 and older and is repeated every 4-6 years. This test checks: Total cholesterol.  This measures the total amount of cholesterol in your blood, including LDL cholesterol, HDL cholesterol, and triglycerides. A healthy number is below 200 mg/dL (4.82 mmol/L). LDL cholesterol. The target number for LDL cholesterol is different for each person, depending on individual risk factors. A healthy number is usually below 100 mg/dL (7.40 mmol/L). Ask your health care provider what your LDL cholesterol should be. HDL cholesterol. An HDL level of 60 mg/dL (8.44 mmol/L) or higher is best because it helps to protect against heart disease. A number below 40 mg/dL (8.96 mmol/L) for men or below 50 mg/dL (8.70 mmol/L) for women increases the risk for heart disease. Triglycerides. A healthy triglyceride number is below 150 mg/dL (8.30 mmol/L). If your lipid profile is abnormal, your health care provider may do other blood tests. How is this treated? Treatment depends on the type of dyslipidemia that you have and your other risk factors for heart disease and stroke. Your health care provider will have a target range for your lipid levels based on this information. Treatment for dyslipidemia starts with lifestyle changes, such as diet and exercise. Your health care provider may recommend that you: Get regular exercise. Make changes to your diet. Quit smoking if you smoke. Limit your alcohol intake. If diet changes and exercise do not help you reach your goals, your health care provider may also prescribe medicine to lower lipids. The most commonly prescribed type of medicine lowers your LDL cholesterol (statin drug). If you have a high triglyceride level, your provider may prescribe another type of drug (fibrate) or an omega-3 fish oil supplement, or both. Follow these instructions at home: Eating and drinking  Follow instructions from your health care provider or dietitian about eating or drinking restrictions. Eat a healthy diet as told by your health care provider. This can help you reach and  maintain a healthy weight, lower your LDL cholesterol, and raise your HDL cholesterol. This may include: Limiting your calories, if you are overweight. Eating more fruits, vegetables, whole grains, fish, and lean meats. Limiting saturated fat, trans fat, and cholesterol. Do not drink alcohol if: Your health care provider tells you not to drink. You are pregnant, may be pregnant, or are planning to become pregnant. If you drink alcohol: Limit how much you have to: 0-1 drink a day for women. 0-2 drinks a day for men. Know how much alcohol is in your drink. In the [place] one drink equals one 12 oz bottle of beer (355 mL), one 5 oz glass of wine (148 mL), or one 1 oz glass of hard liquor (44 mL). Activity Get regular exercise. Start an exercise and strength training program as told by your health care provider.  Ask your health care provider what activities are safe for you. Your health care provider may recommend: 30 minutes of aerobic activity 4-6 days a week. Brisk walking is an example of aerobic activity. Strength training 2 days a week. General instructions Do not use any products that contain nicotine or tobacco. These products include cigarettes, chewing tobacco, and vaping devices, such as e-cigarettes. If you need help quitting, ask your health care provider. Take over-the-counter and prescription medicines only as told by your health care provider. This includes supplements. Keep all follow-up visits. This is important. Contact a health care provider if: You are having trouble sticking to your exercise or diet plan. You are struggling to quit smoking or to control your use of alcohol. Summary Dyslipidemia often involves a high level of cholesterol or triglycerides, which are types of lipids. Treatment depends on the type of dyslipidemia that you have and your other risk factors for heart disease and stroke. Treatment for dyslipidemia starts with lifestyle changes, such as diet and  exercise. Your health care provider may prescribe medicine to lower lipids. This information is not intended to replace advice given to you by your health care provider. Make sure you discuss any questions you have with your health care provider. Document Revised: 11/11/2021 Document Reviewed: 06/14/2020 Elsevier Patient Education  2025 [employer]. Otitis Media, Adult  Otitis media is a condition in which the middle ear is red and swollen (inflamed) and full of fluid. The middle ear is the part of the ear that contains bones for hearing as well as air that helps send sounds to the brain. The condition usually goes away on its own. What are the causes? This condition is caused by a blockage in the eustachian tube. This tube connects the middle ear to the back of the nose. It normally allows air into the middle ear. The blockage is caused by fluid or swelling. Problems that can cause blockage include: A cold or infection that affects the nose, mouth, or throat. Allergies. An irritant, such as tobacco smoke. Adenoids that have become large. The adenoids are soft tissue located in the back of the throat, behind the nose and the roof of the mouth. Growth or swelling in the upper part of the throat, just behind the nose (nasopharynx). Damage to the ear caused by a change in pressure. This is called barotrauma. What increases the risk? You are more likely to develop this condition if you: Smoke or are exposed to tobacco smoke. Have an opening in the roof of your mouth (cleft palate). Have acid reflux. Have problems in your body's defense system (immune system). What are the signs or symptoms? Symptoms of this condition include: Ear pain. Fever. Problems with hearing. Being tired. Fluid leaking from the ear. Ringing in the ear. How is this treated? This condition can go away on its own within 3-5 days. But if the condition is caused by germs (bacteria) and does not go away on its own, or if  it keeps coming back, your doctor may: Give you antibiotic medicines. Give you medicines for pain. Follow these instructions at home: Take over-the-counter and prescription medicines only as told by your doctor. If you were prescribed an antibiotic medicine, take it as told by your doctor. Do not stop taking it even if you start to feel better. Keep all follow-up visits. Contact a doctor if: You have bleeding from your nose. There is a lump on your neck. You are not feeling better in 5 days.  You feel worse instead of better. Get help right away if: You have pain that is not helped with medicine. You have swelling, redness, or pain around your ear. You get a stiff neck. You cannot move part of your face (paralysis). You notice that the bone behind your ear hurts when you touch it. You get a very bad headache. Summary Otitis media means that the middle ear is red, swollen, and full of fluid. This condition usually goes away on its own. If the problem does not go away, treatment may be needed. You may be given medicines to treat the infection or to treat your pain. If you were prescribed an antibiotic medicine, take it as told by your doctor. Do not stop taking it even if you start to feel better. Keep all follow-up visits. This information is not intended to replace advice given to you by your health care provider. Make sure you discuss any questions you have with your health care provider. Document Revised: 07/19/2020 Document Reviewed: 07/19/2020 Elsevier Patient Education  2024 [employer]  The above assessment and management plan was discussed with the patient. The patient verbalized understanding of and has agreed to the management plan. Patient is aware to call the clinic if they develop any new symptoms or if symptoms persist or worsen. Patient is aware when to return to the clinic for a follow-up visit. Patient educated on when it is appropriate to go to the emergency  department.  @SIGNATURE @    "

## 2024-02-26 NOTE — Telephone Encounter (Signed)
 FYI Only or Action Required?: FYI only for provider: appointment scheduled on 11/5.  Patient was last seen in primary care on 02/07/2024 by Deitra Morton Sebastian Nena, NP.  Called Nurse Triage reporting Adenopathy.  Symptoms began yesterday.  Interventions attempted: OTC medications: Tylenol .  Symptoms are: unchanged.  Triage Disposition: See PCP When Office is Open (Within 3 Days)  Patient/caregiver understands and will follow disposition?: Yes             Copied from CRM [id number]. Topic: Clinical - Red Word Triage >> Feb 26, 2024 11:13 AM Gustabo D wrote: Pt left side lymph node and by her ear on the left side is she's feeling pressure kind of swollen. Started yesterday. Reason for Disposition  [1] Very tender to the touch AND [2] no fever  Answer Assessment - Initial Assessment Questions 1. LOCATION: Where is the swollen node located? Is the matching node on the other side of the body also swollen?      Left side jaw area  2. SIZE: How big is the node? (e.g., inches or centimeters; or compared to common objects such as pea, bean, marble, golf ball)      Quarter   3. ONSET: When did the swelling start?      X 1 day  4. NECK NODES: Is there a sore throat, runny nose or other symptoms of a cold?      Mild sore throat, nasal congestion, ear feels hot    6. FEVER: Do you have a fever? If Yes, ask: What is it, how was it measured, and when did it start?      No   7. CAUSE: What do you think is causing the swollen lymph nodes?     Unsure  8. OTHER SYMPTOMS: Do you have any other symptoms? (e.g., node is tender to touch, skin redness over node, weight changes)  Tender to touch,no redness noted.    Appt. Scheduled.  Protocols used: Lymph Nodes - Swollen-A-AH

## 2024-02-27 ENCOUNTER — Telehealth: Payer: Self-pay | Admitting: Family

## 2024-02-27 ENCOUNTER — Ambulatory Visit: Payer: Self-pay | Admitting: Nurse Practitioner

## 2024-02-27 ENCOUNTER — Encounter: Payer: Self-pay | Admitting: Nurse Practitioner

## 2024-02-27 VITALS — BP 147/73 | HR 67 | Temp 97.7°F | Ht 65.0 in | Wt 162.8 lb

## 2024-02-27 DIAGNOSIS — H66002 Acute suppurative otitis media without spontaneous rupture of ear drum, left ear: Secondary | ICD-10-CM

## 2024-02-27 DIAGNOSIS — E785 Hyperlipidemia, unspecified: Secondary | ICD-10-CM | POA: Diagnosis not present

## 2024-02-27 DIAGNOSIS — R0981 Nasal congestion: Secondary | ICD-10-CM | POA: Diagnosis not present

## 2024-02-27 MED ORDER — FLUTICASONE PROPIONATE 50 MCG/ACT NA SUSP: 2.0000 | Freq: Every day | NASAL | 6 refills | Status: DC

## 2024-02-27 MED ORDER — ROSUVASTATIN CALCIUM 20 MG PO TABS: 20.0000 mg | ORAL_TABLET | Freq: Every day | ORAL | 0 refills | Status: DC

## 2024-02-27 MED ORDER — CEFDINIR 300 MG PO CAPS: 300.0000 mg | ORAL_CAPSULE | Freq: Two times a day (BID) | ORAL | 0 refills | Status: AC

## 2024-02-27 NOTE — Telephone Encounter (Signed)
 Copied from CRM [id number]. Topic: Clinical - Medication Question >> Feb 27, 2024  1:44 PM Avram MATSU wrote: Reason for CRM: pt is requesting amoxicillin  for her ear infection and she is not familiar with cefdinir (OMNICEF) 300 MG capsule [[id number]]  please advise to discuss [phone] (M)

## 2024-02-28 ENCOUNTER — Other Ambulatory Visit: Payer: Self-pay | Admitting: *Deleted

## 2024-02-28 ENCOUNTER — Encounter: Payer: Self-pay | Admitting: Family

## 2024-02-28 DIAGNOSIS — I480 Paroxysmal atrial fibrillation: Secondary | ICD-10-CM

## 2024-02-28 NOTE — Telephone Encounter (Signed)
 Appt scheduled for 06/05/24

## 2024-02-28 NOTE — Telephone Encounter (Signed)
 Kathryn Arnold NTBS in Feb for 6 mos FU RF sent to pharmacy

## 2024-03-26 ENCOUNTER — Encounter: Payer: Self-pay | Admitting: Cardiology

## 2024-03-26 ENCOUNTER — Ambulatory Visit (INDEPENDENT_AMBULATORY_CARE_PROVIDER_SITE_OTHER): Payer: Self-pay | Admitting: Cardiology

## 2024-03-26 VITALS — BP 148/80 | HR 65 | Ht 65.0 in | Wt 163.0 lb

## 2024-03-26 DIAGNOSIS — I10 Essential (primary) hypertension: Secondary | ICD-10-CM | POA: Diagnosis not present

## 2024-03-26 DIAGNOSIS — R6889 Other general symptoms and signs: Secondary | ICD-10-CM | POA: Insufficient documentation

## 2024-03-26 DIAGNOSIS — I480 Paroxysmal atrial fibrillation: Secondary | ICD-10-CM | POA: Diagnosis not present

## 2024-03-26 NOTE — Patient Instructions (Addendum)
 Medication Instructions:  Your physician recommends that you continue on your current medications as directed. Please refer to the Current Medication list given to you today.  Labwork: none  Testing/Procedures: none  Follow-Up: Your physician recommends that you schedule a follow-up appointment in: 18 months. You will receive a reminder call in about 14-16 months reminding you to schedule your appointment. If you don't receive this call, please contact our office.  Any Other Special Instructions Will Be Listed Below (If Applicable).  If you need a refill on your cardiac medications before your next appointment, please call your pharmacy.

## 2024-03-26 NOTE — Progress Notes (Signed)
  Cardiology Office Note:   Date:  03/26/2024  ID:  Kathryn Arnold, DOB [birth year], MRN [id number] PCP: Lavell Bari LABOR, FNP  [facility] Providers Cardiologist:  Lynwood Schilling, MD {  History of Present Illness:   Kathryn Arnold is a 78 y.o. female who presents for follow up of atrial fib. She has had two ablations and has been treated with flecainide . She was seen in the Atrial Fib Clinic. She had a Linq removed in May 2021. At a previous visit she was having increased palpitations. She wore a two week monitor. This demonstrated no atrial fib but some short runs of bradycardia not associated with symptoms. She also had decreased exercise tolerance and was to have a PET. However, she started feeling better.  She did not have this and it was not rescheduled.  She returns for follow up.    She has not had any new paroxysms recently.  She stays physically active.  She does a lot of yard and housework. The patient denies any new symptoms such as chest discomfort, neck or arm discomfort. There has been no new shortness of breath, PND or orthopnea. There have been no reported palpitations, presyncope or syncope.   ROS: As stated in the HPI and negative for all other systems.  Studies Reviewed:    EKG:     Normal sinus rhythm, rate 73, axis within normal limits, intervals within normal limits, RSR prime V1 and V2, QTc mildly prolonged.  Sep 14, 2023.  Risk Assessment/Calculations:    CHA2DS2-VASc Score = 4   This indicates a 4.8% annual risk of stroke. The patient's score is based upon: CHF History: 0 HTN History: 1 Diabetes History: 0 Stroke History: 0 Vascular Disease History: 0 Age Score: 2 Gender Score: 1     Physical Exam:   VS:  BP (!) 148/80   Pulse 65   Ht 5' 5 (1.651 m)   Wt 163 lb (73.9 kg)   BMI 27.12 kg/m    Wt Readings from Last 3 Encounters:  03/26/24 163 lb (73.9 kg)  02/27/24 162 lb 12.8 oz (73.8 kg)  02/07/24 158 lb 12.8 oz (72 kg)     GEN: Well  nourished, well developed in no acute distress NECK: No JVD; No carotid bruits CARDIAC: RRR, no murmurs, rubs, gallops RESPIRATORY:  Clear to auscultation without rales, wheezing or rhonchi  ABDOMEN: Soft, non-tender, non-distended EXTREMITIES:  No edema; No deformity   ASSESSMENT AND PLAN:   PAF:   The patient has no new symptoms.  Tolerates anticoagulation.  No change in therapy.  I did look at electrolytes and CBC.  She is up-to-date with blood work.  She is on the appropriate dose of anticoagulation.   HTN: Her blood pressure is mildly elevated but she says it is typically well-controlled at home.  No change in therapy.   Follow up with me in 18 months.  Signed, Lynwood Schilling, MD

## 2024-03-27 ENCOUNTER — Ambulatory Visit: Payer: Self-pay

## 2024-03-27 NOTE — Telephone Encounter (Signed)
 NOTED

## 2024-03-27 NOTE — Telephone Encounter (Signed)
 FYI Only or Action Required?: FYI only for provider: appointment scheduled on 03/28/24. Pt may go to UC today to avoid stormy weather tomorrow. She will call to cancel appt if she ends up going to UC.  Patient was last seen in primary care on 02/27/2024 by Deitra Morton Sebastian Nena, NP.  Called Nurse Triage reporting Insect Bite.  Symptoms began several days ago.  Interventions attempted: OTC medications: Zyrtec , cortisone cream and Other: baking soda and vinegar.  Symptoms are: gradually worsening.  Triage Disposition: See Physician Within 24 Hours  Patient/caregiver understands and will follow disposition?: Yes   Copied from CRM #[id number]. Topic: Clinical - Red Word Triage >> Mar 27, 2024  2:45 PM Antwanette L wrote: Red Word that prompted transfer to Nurse Triage:  Patient reports an insect bite on her right arm. Symptoms: - Redness - Swelling - Lump below the bite site Reason for Disposition  [1] Red or very tender (to touch) area AND [2] getting larger over 48 hours after the bite  Answer Assessment - Initial Assessment Questions Pt thinks she may have been bit by an insect with onset of redness and swelling in right forearm and what feels like a lump. Denies SOB or fever. Appt scheduled tomorrow at home office. Pt may go to UC today to avoid stormy weather tomorrow and will call to cancel appt if needed. Advised UC or ED for worsening symptoms.   1. TYPE of INSECT: What type of insect was it?      Unsure  2. ONSET: When did you get bitten?      Sunday morning  3. LOCATION: Where is the insect bite located?      Right forearm  4. REDNESS: Is the area red or pink? If Yes, ask: What size is the area of redness? (inches or cm). When did the redness start?     Pinkish red  5. PAIN: Is there any pain? If Yes, ask: How bad is the pain? (Scale 0-10; or none, mild, moderate, severe)     Moderate  6. ITCHING: Does it itch? If Yes, ask: How bad is the itch?       Moderate  7. SWELLING: How big is the swelling? (e.g., inches, cm, or compare to coins)     5-6 inches long, 3 inches wide with a lump  8. OTHER SYMPTOMS: Do you have any other symptoms?  (e.g., difficulty breathing, fever, hives)     Denies SOB, denies fever  Protocols used: Insect Bite-A-AH

## 2024-03-28 ENCOUNTER — Ambulatory Visit

## 2024-04-25 ENCOUNTER — Telehealth: Payer: Self-pay | Admitting: Cardiology

## 2024-04-25 NOTE — Telephone Encounter (Signed)
 Patient plans to have allergy testing completed next week and would like to know if it is OK for her to proceed with her hx of A-Fib.  Informed patient this should be OK, but will forward to Dr. Lavona to review and see if he has any advice regarding this.

## 2024-04-25 NOTE — Telephone Encounter (Signed)
 Spoke with Pt. Confirmed it was ok to proceed. Pt stated understanding.

## 2024-04-25 NOTE — Telephone Encounter (Signed)
 Pt wants to know if she can have an allergy test done if she has AFIB. Please advise.

## 2024-05-03 ENCOUNTER — Other Ambulatory Visit: Payer: Self-pay | Admitting: Family

## 2024-05-03 DIAGNOSIS — B009 Herpesviral infection, unspecified: Secondary | ICD-10-CM

## 2024-05-06 ENCOUNTER — Ambulatory Visit: Payer: Self-pay

## 2024-05-06 NOTE — Telephone Encounter (Signed)
 Called patient she is not going to urgent care or ER. Patient states been going on a week made appt for tomorrow. Patient advised if any chest pain SOB or dizziness gets worse to go to ER and she verbalized understanding.

## 2024-05-06 NOTE — Telephone Encounter (Signed)
 FYI Only or Action Required?: Action required by provider: request for appointment, clinical question for provider, and update on patient condition.  Patient was last seen in primary care on 02/27/2024 by Deitra Morton Sebastian Nena, NP.  Called Nurse Triage reporting Dizziness.  Symptoms began a week ago.  Interventions attempted: Dietary changes.  Symptoms are: gradually worsening.  Triage Disposition: Go to ED Now (or PCP Triage)  Patient/caregiver understands and will follow disposition?: No, refuses disposition      Copied from CRM [id number]. Topic: Clinical - Red Word Triage >> May 06, 2024  8:10 AM Kathryn Arnold wrote: Red Word that prompted transfer to Nurse Triage: Patient states that she thinks she may be having issues with hypoglycemia. States every morning when she gets up she has low energy & dizziness. States she has to load up on things like apple juice. Has not tested her blood sugar but bought a kit yesterday. Reason for Disposition  Patient sounds very sick or weak to the triager  Answer Assessment - Initial Assessment Questions This RN recommended pt be examined in hospital, pt refusing, pt requesting appt, asking if can start glucose tablets. Advised pt call 911 or get to hospital asap if any new or worsening symptoms. Advised take blood sugar if she can and call back. Sending message to PCP office for call back to pt with further recommendations. Alerted [facility] to ED refusal.    Pt reports hx of hypoglycemia, but confirms worse and more consistent this past week  Pt unable to assess blood sugar level at this time, bought a kit, waiting for granddaughter to use it together  Symptoms: Low energy Don't feel good at all Dizziness, some stumbling - felt like about to pass out couple times Brain fog Some heart racing but not into a-fib per pt Takes me half a day or longer to get to feeling like blood sugar is normal Little SOB when dizzy Hungry in middle of  night  Denies: Weaker than usual Changes to awareness/consciousness/speech Chest pain Fever Dry mouth Dark urine Vomiting  Protocols used: Diabetes - Low Blood Sugar-A-AH

## 2024-05-07 ENCOUNTER — Ambulatory Visit

## 2024-05-07 ENCOUNTER — Encounter: Payer: Self-pay | Admitting: Family Medicine

## 2024-05-07 ENCOUNTER — Ambulatory Visit: Payer: Self-pay | Admitting: Family Medicine

## 2024-05-07 VITALS — BP 174/89 | HR 71 | Temp 98.0°F | Ht 65.0 in | Wt 163.2 lb

## 2024-05-07 DIAGNOSIS — N898 Other specified noninflammatory disorders of vagina: Secondary | ICD-10-CM | POA: Diagnosis not present

## 2024-05-07 DIAGNOSIS — R5383 Other fatigue: Secondary | ICD-10-CM | POA: Diagnosis not present

## 2024-05-07 DIAGNOSIS — N3000 Acute cystitis without hematuria: Secondary | ICD-10-CM | POA: Diagnosis not present

## 2024-05-07 DIAGNOSIS — R5381 Other malaise: Secondary | ICD-10-CM | POA: Diagnosis not present

## 2024-05-07 DIAGNOSIS — R42 Dizziness and giddiness: Secondary | ICD-10-CM

## 2024-05-07 DIAGNOSIS — H814 Vertigo of central origin: Secondary | ICD-10-CM

## 2024-05-07 LAB — ANEMIA PROFILE B
Basophils Absolute: 0 x10E3/uL (ref 0.0–0.2)
Basos: 1 %
EOS (ABSOLUTE): 0.1 x10E3/uL (ref 0.0–0.4)
Eos: 2 %
Ferritin: 71 ng/mL (ref 15–150)
Folate: >20 ng/mL
Hematocrit: 44.8 % (ref 34.0–46.6)
Hemoglobin: 15 g/dL (ref 11.1–15.9)
Immature Grans (Abs): 0 x10E3/uL (ref 0.0–0.1)
Immature Granulocytes: 0 %
Iron Saturation: 24 % (ref 15–55)
Iron: 85 ug/dL (ref 27–139)
Lymphocytes Absolute: 1.6 x10E3/uL (ref 0.7–3.1)
Lymphs: 32 %
MCH: 30.1 pg (ref 26.6–33.0)
MCHC: 33.5 g/dL (ref 31.5–35.7)
MCV: 90 fL (ref 79–97)
Monocytes Absolute: 0.5 x10E3/uL (ref 0.1–0.9)
Monocytes: 10 %
Neutrophils Absolute: 2.9 x10E3/uL (ref 1.4–7.0)
Neutrophils: 55 %
Platelets: 247 x10E3/uL (ref 150–450)
RBC: 4.98 x10E6/uL (ref 3.77–5.28)
RDW: 12.3 % (ref 11.7–15.4)
Retic Ct Pct: 1.4 % (ref 0.6–2.6)
Total Iron Binding Capacity: 355 ug/dL (ref 250–450)
UIBC: 270 ug/dL (ref 118–369)
Vitamin B-12: 864 pg/mL (ref 232–1245)
WBC: 5.2 x10E3/uL (ref 3.4–10.8)

## 2024-05-07 LAB — CMP14+EGFR
ALT: 21 IU/L (ref 0–32)
AST: 21 IU/L (ref 0–40)
Albumin: 4.4 g/dL (ref 3.8–4.8)
Alkaline Phosphatase: 62 IU/L (ref 49–135)
BUN/Creatinine Ratio: 29 — ABNORMAL HIGH (ref 12–28)
BUN: 21 mg/dL (ref 8–27)
Bilirubin Total: 0.3 mg/dL (ref 0.0–1.2)
CO2: 19 mmol/L — ABNORMAL LOW (ref 20–29)
Calcium: 9.4 mg/dL (ref 8.7–10.3)
Chloride: 104 mmol/L (ref 96–106)
Creatinine, Ser: 0.72 mg/dL (ref 0.57–1.00)
Globulin, Total: 2.7 g/dL (ref 1.5–4.5)
Glucose: 106 mg/dL — ABNORMAL HIGH (ref 70–99)
Potassium: 4.2 mmol/L (ref 3.5–5.2)
Sodium: 140 mmol/L (ref 134–144)
Total Protein: 7.1 g/dL (ref 6.0–8.5)
eGFR: 87 mL/min/1.73

## 2024-05-07 LAB — MICROSCOPIC EXAMINATION
Epithelial Cells (non renal): NONE SEEN /HPF (ref 0–10)
RBC, Urine: NONE SEEN /HPF (ref 0–2)
Renal Epithel, UA: NONE SEEN /HPF
Yeast, UA: NONE SEEN

## 2024-05-07 LAB — URINALYSIS, ROUTINE W REFLEX MICROSCOPIC
Bilirubin, UA: NEGATIVE
Glucose, UA: NEGATIVE
Ketones, UA: NEGATIVE
Nitrite, UA: NEGATIVE
Protein,UA: NEGATIVE
RBC, UA: NEGATIVE
Specific Gravity, UA: 1.01 (ref 1.005–1.030)
Urobilinogen, Ur: 0.2 mg/dL (ref 0.2–1.0)
pH, UA: 7 (ref 5.0–7.5)

## 2024-05-07 LAB — WET PREP FOR TRICH, YEAST, CLUE
Clue Cell Exam: NEGATIVE
Trichomonas Exam: NEGATIVE
Yeast Exam: NEGATIVE

## 2024-05-07 LAB — T4, FREE: Free T4: 1.24 ng/dL (ref 0.82–1.77)

## 2024-05-07 LAB — TSH: TSH: 1.78 u[IU]/mL (ref 0.450–4.500)

## 2024-05-07 MED ORDER — AMOXICILLIN-POT CLAVULANATE 875-125 MG PO TABS: 1.0000 | ORAL_TABLET | Freq: Two times a day (BID) | ORAL | 0 refills | Status: AC

## 2024-05-07 NOTE — Progress Notes (Signed)
 "    Subjective:  Patient ID: Kathryn Arnold, female    DOB: 02/09/1946, 79 y.o.   MRN: [id number]  Patient Care Team: Lavell Bari LABOR, FNP as PCP - General (Nurse Practitioner) Lavona Agent, MD as PCP - Cardiology (Cardiology)   Chief Complaint:  Dizziness (X 1 week - daily )   HPI: Kathryn Arnold is a 79 y.o. female presenting on 05/07/2024 for Dizziness (X 1 week - daily )  Kathryn Arnold is a 78 year old female who presents with intermittent dizziness and fatigue.  She has been experiencing intermittent dizziness for the past several months, worsening over the last week, primarily in the mornings and before meals. The dizziness improves slightly after consuming food such as peanut butter crackers or apple juice, but persists. She recalls a previous episode of dizziness associated with a urinary tract infection (UTI).  She was hospitalized last in May 2025 for similar symptoms. A comprehensive workup was conducted, and a UTI was diagnosed. She feels similar symptoms to those experienced during the previous UTI episode, including slight itchiness. No increased urination, burning, or vaginal discharge. She has a history of bladder prolapse but has not pursued surgical intervention or used a pessary.  She mentions staggering slightly when dizzy. She has not seen a neurologist for the dizziness and has not had any imaging of her head. She has not been able to see her primary care provider recently due to scheduling issues.          Relevant past medical, surgical, family, and social history reviewed and updated as indicated.  Allergies and medications reviewed and updated. Data reviewed: Chart in Epic.   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   HSV-2 (herpes simplex virus 2) infection    Hypercholesteremia     Hyperlipidemia with target LDL less than 100    Hypertension    Osteoporosis, unspecified    Last DEXA 10/2011    Paroxysmal atrial fibrillation (HCC)    s/p ablation Afib and flutter 11/03/14   PONV (postoperative nausea and vomiting)    Vitamin D  deficiency     Past Surgical History:  Procedure Laterality Date   ATRIAL FIBRILLATION ABLATION N/A 10/31/2016   Procedure: Atrial Fibrillation Ablation;  Surgeon: Kelsie Agent, MD;  Location: [facility] INVASIVE CV LAB;  Service: Cardiovascular;  Laterality: N/A;   BREAST LUMPECTOMY Left    DILATION AND CURETTAGE OF UTERUS     ELECTROPHYSIOLOGIC STUDY N/A 11/03/2014   Procedure: Atrial Fibrillation Ablation;  Surgeon: Agent Kelsie, MD;  Location: [facility] INVASIVE CV LAB;  Service: Cardiovascular;  Laterality: N/A;   implantable loop recorder placement  03/08/2018   [employer] Reveal LINQ implantation by Dr Kelsie for afib management post ablation   LAPAROSCOPY ABDOMEN DIAGNOSTIC  1990s   LOOP RECORDER REMOVAL  09/17/2019   MDT Reveal LINQ removed in office by Dr Kelsie   TEE WITHOUT CARDIOVERSION N/A 10/30/2016   Procedure: TRANSESOPHAGEAL ECHOCARDIOGRAM (TEE);  Surgeon: Francyne Headland, MD;  Location: [facility] ENDOSCOPY;  Service: Cardiovascular;  Laterality: N/A;   TUBAL LIGATION  1978    Social History   Socioeconomic History   Marital status: Married    Spouse name: Not on file   Number of children: Not on file   Years of education: Not  on file   Highest education level: Not on file  Occupational History   Not on file  Tobacco Use   Smoking status: Never   Smokeless tobacco: Never  Vaping Use   Vaping status: Never Used  Substance and Sexual Activity   Alcohol use: No   Drug use: No   Sexual activity: Not Currently    Birth control/protection: None  Other Topics Concern   Not on file  Social History Narrative   Lives in [place] with spouse.   Social Drivers of Health   Tobacco Use: Low Risk (05/07/2024)   Patient History    Smoking  Tobacco Use: Never    Smokeless Tobacco Use: Never    Passive Exposure: Not on file  Financial Resource Strain: Not on file  Food Insecurity: Not on file  Transportation Needs: Not on file  Physical Activity: Not on file  Stress: Not on file  Social Connections: Not on file  Intimate Partner Violence: Not on file  Depression (PHQ2-9): Low Risk (12/10/2023)   Depression (PHQ2-9)    PHQ-2 Score: 0  Alcohol Screen: Not on file  Housing: Not on file  Utilities: Not on file  Health Literacy: Not on file    Outpatient Encounter Medications as of 05/07/2024  Medication Sig   amoxicillin -clavulanate (AUGMENTIN ) 875-125 MG tablet Take 1 tablet by mouth 2 (two) times daily for 7 days.   Calcium  Citrate-Vitamin D  (CITRACAL + D PO) Take 500 mg by mouth 2 (two) times daily.   diltiazem  (CARDIZEM  CD) 180 MG 24 hr capsule Take 1 capsule by mouth once daily   diltiazem  (CARDIZEM ) 60 MG tablet Take 0.5-1 tablets (30-60 mg total) by mouth every 6 (six) hours as needed (Heart rate over 100 as long as systolic blood pressure >100).   flecainide  (TAMBOCOR ) 50 MG tablet Take 1 tablet by mouth twice daily   Multiple Vitamins-Minerals (CENTRUM SILVER PO) Take 1 tablet by mouth daily.   rosuvastatin  (CRESTOR ) 20 MG tablet Take 1 tablet (20 mg total) by mouth at bedtime.   valACYclovir  (VALTREX ) 500 MG tablet Take 1 tablet by mouth once daily   XARELTO  20 MG TABS tablet TAKE 1 TABLET BY MOUTH AT BEDTIME   Facility-Administered Encounter Medications as of 05/07/2024  Medication   denosumab  (PROLIA ) injection 60 mg    Allergies[1]  Pertinent ROS per HPI, otherwise unremarkable      Objective:  BP (!) 174/89 (BP Location: Right Arm, Patient Position: Standing, Cuff Size: Normal)   Pulse 71   Temp 98 F (36.7 C)   Ht 5' 5 (1.651 m)   Wt 163 lb 3.2 oz (74 kg)   SpO2 97%   BMI 27.16 kg/m    Wt Readings from Last 3 Encounters:  05/07/24 163 lb 3.2 oz (74 kg)  03/26/24 163 lb (73.9 kg)  02/27/24  162 lb 12.8 oz (73.8 kg)    Physical Exam Vitals and nursing note reviewed.  Constitutional:      General: She is not in acute distress.    Appearance: Normal appearance. She is well-developed and well-groomed. She is not ill-appearing, toxic-appearing or diaphoretic.  HENT:     Head: Normocephalic and atraumatic.     Jaw: There is normal jaw occlusion.     Right Ear: Hearing, tympanic membrane, ear canal and external ear normal.     Left Ear: Hearing, tympanic membrane, ear canal and external ear normal.     Nose: Nose normal.     Mouth/Throat:  Lips: Pink.     Mouth: Mucous membranes are moist.     Pharynx: Oropharynx is clear. Uvula midline.  Eyes:     General: Lids are normal.     Extraocular Movements: Extraocular movements intact.     Conjunctiva/sclera: Conjunctivae normal.     Pupils: Pupils are equal, round, and reactive to light.  Neck:     Thyroid : No thyroid  mass, thyromegaly or thyroid  tenderness.     Vascular: No carotid bruit or JVD.     Trachea: Trachea and phonation normal.  Cardiovascular:     Rate and Rhythm: Normal rate and regular rhythm.     Chest Wall: PMI is not displaced.     Pulses: Normal pulses.     Heart sounds: Normal heart sounds. No murmur heard.    No friction rub. No gallop.  Pulmonary:     Effort: Pulmonary effort is normal. No respiratory distress.     Breath sounds: Normal breath sounds. No wheezing.  Abdominal:     General: Bowel sounds are normal. There is no distension or abdominal bruit.     Palpations: Abdomen is soft. There is no hepatomegaly or splenomegaly.     Tenderness: There is no abdominal tenderness. There is no right CVA tenderness or left CVA tenderness.     Hernia: No hernia is present.  Musculoskeletal:        General: Normal range of motion.     Cervical back: Normal range of motion and neck supple.     Right lower leg: No edema.     Left lower leg: No edema.  Lymphadenopathy:     Cervical: No cervical  adenopathy.  Skin:    General: Skin is warm and dry.     Capillary Refill: Capillary refill takes less than 2 seconds.     Coloration: Skin is not cyanotic, jaundiced or pale.     Findings: No rash.  Neurological:     General: No focal deficit present.     Mental Status: She is alert and oriented to person, place, and time.     Cranial Nerves: Cranial nerves 2-12 are intact.     Sensory: Sensation is intact.     Motor: Motor function is intact.     Coordination: Coordination is intact.     Gait: Gait abnormal (slow) and tandem walk abnormal.     Deep Tendon Reflexes: Reflexes are normal and symmetric.  Psychiatric:        Attention and Perception: Attention and perception normal.        Mood and Affect: Mood and affect normal.        Speech: Speech normal.        Behavior: Behavior normal. Behavior is cooperative.        Thought Content: Thought content normal.        Cognition and Memory: Cognition and memory normal.        Judgment: Judgment normal.      Results for orders placed or performed in visit on 12/10/23  CMP14+EGFR   Collection Time: 12/10/23 10:06 AM  Result Value Ref Range   Glucose 99 70 - 99 mg/dL   BUN 17 8 - 27 mg/dL   Creatinine, Ser 9.36 0.57 - 1.00 mg/dL   eGFR 92 >40 fO/fpw/8.26   BUN/Creatinine Ratio 27 12 - 28   Sodium 139 134 - 144 mmol/L   Potassium 4.2 3.5 - 5.2 mmol/L   Chloride 104 96 - 106 mmol/L   CO2  21 20 - 29 mmol/L   Calcium  9.5 8.7 - 10.3 mg/dL   Total Protein 6.8 6.0 - 8.5 g/dL   Albumin 4.3 3.8 - 4.8 g/dL   Globulin, Total 2.5 1.5 - 4.5 g/dL   Bilirubin Total 0.3 0.0 - 1.2 mg/dL   Alkaline Phosphatase 64 44 - 121 IU/L   AST 24 0 - 40 IU/L   ALT 20 0 - 32 IU/L  CBC with Differential/Platelet   Collection Time: 12/10/23 10:06 AM  Result Value Ref Range   WBC 4.9 3.4 - 10.8 x10E3/uL   RBC 4.70 3.77 - 5.28 x10E6/uL   Hemoglobin 14.3 11.1 - 15.9 g/dL   Hematocrit 56.9 65.9 - 46.6 %   MCV 92 79 - 97 fL   MCH 30.4 26.6 - 33.0 pg    MCHC 33.3 31.5 - 35.7 g/dL   RDW 87.7 88.2 - 84.5 %   Platelets 262 150 - 450 x10E3/uL   Neutrophils 57 Not Estab. %   Lymphs 33 Not Estab. %   Monocytes 8 Not Estab. %   Eos 1 Not Estab. %   Basos 1 Not Estab. %   Neutrophils Absolute 2.8 1.4 - 7.0 x10E3/uL   Lymphocytes Absolute 1.6 0.7 - 3.1 x10E3/uL   Monocytes Absolute 0.4 0.1 - 0.9 x10E3/uL   EOS (ABSOLUTE) 0.1 0.0 - 0.4 x10E3/uL   Basophils Absolute 0.0 0.0 - 0.2 x10E3/uL   Immature Granulocytes 0 Not Estab. %   Immature Grans (Abs) 0.0 0.0 - 0.1 x10E3/uL  VITAMIN D  25 Hydroxy (Vit-D Deficiency, Fractures)   Collection Time: 12/10/23 10:06 AM  Result Value Ref Range   Vit D, 25-Hydroxy 42.9 30.0 - 100.0 ng/mL       Pertinent labs & imaging results that were available during my care of the patient were reviewed by me and considered in my medical decision making.  Assessment & Plan:  Kathryn Arnold was seen today for dizziness.  Diagnoses and all orders for this visit:  Dizziness -     Anemia Profile B -     CMP14+EGFR -     TSH -     T4, Free -     Ambulatory referral to Neurology -     MR ANGIO HEAD WO W CONTRAST; Future  Vertigo of central origin -     Anemia Profile B -     CMP14+EGFR -     TSH -     T4, Free -     Ambulatory referral to Neurology -     MR ANGIO HEAD WO W CONTRAST; Future  Malaise and fatigue -     Urine Culture -     Urinalysis, Routine w reflex microscopic -     WET PREP FOR TRICH, YEAST, CLUE -     Anemia Profile B -     CMP14+EGFR -     TSH -     T4, Free  Vaginal pruritus -     Urine Culture -     Urinalysis, Routine w reflex microscopic -     WET PREP FOR TRICH, YEAST, CLUE  Acute cystitis without hematuria -     amoxicillin -clavulanate (AUGMENTIN ) 875-125 MG tablet; Take 1 tablet by mouth 2 (two) times daily for 7 days.      Intermittent dizziness and fatigue Intermittent dizziness primarily in the mornings, exacerbated before eating, with associated fatigue. Previous  hospital workup for dizziness was comprehensive. Differential diagnosis includes anemia, electrolyte imbalances, thyroid  dysfunction, and neurological causes.  No acute neurological deficits on examination. - Ordered blood work to evaluate for anemia, electrolyte imbalances, and thyroid  dysfunction - Ordered MRI of the head - Referred to neurology for further evaluation of ongoing dizziness  Evaluation for urinary tract infection and vaginal pruritus Recent hospitalization for urinary tract infection. Current symptoms include mild vaginal pruritus without significant urinary symptoms. - Ordered urinalysis and wet prep to evaluate for urinary tract infection or vaginal infection  Bladder prolapse Without current use of pessary or surgical intervention. She is apprehensive about surgical options due to previous advice against surgery.       Urinalysis concerning for UTI, antibiotics initiated. Culture pending, will change if warranted. Wet prep unremarkable.    Continue all other maintenance medications.  Follow up plan: Return if symptoms worsen or fail to improve.   Continue healthy lifestyle choices, including diet (rich in fruits, vegetables, and lean proteins, and low in salt and simple carbohydrates) and exercise (at least 30 minutes of moderate physical activity daily).  Educational handout given for dizziness  The above assessment and management plan was discussed with the patient. The patient verbalized understanding of and has agreed to the management plan. Patient is aware to call the clinic if they develop any new symptoms or if symptoms persist or worsen. Patient is aware when to return to the clinic for a follow-up visit. Patient educated on when it is appropriate to go to the emergency department.   Kathryn Bruns, FNP-C [facility] [phone]     [1] No Known Allergies  "

## 2024-05-08 ENCOUNTER — Encounter: Payer: Self-pay | Admitting: Neurology

## 2024-05-09 LAB — URINE CULTURE

## 2024-05-14 ENCOUNTER — Telehealth: Payer: Self-pay | Admitting: Family

## 2024-05-14 NOTE — Telephone Encounter (Signed)
 There is not a new referral for the rheumatology. Will provider order a referral?  Copied from CRM #[id number]. Topic: Referral - Question >> May 14, 2024  2:04 PM Delon DASEN wrote: Reason for CRM: Patient asking if she needs a referral for a rheumatologist, if so she wants to see Dr Theotis- [phone]

## 2024-05-14 NOTE — Telephone Encounter (Signed)
 Pt has not seen Christy since 11/2023. Advised that she would need an appt to discuss. Pt states that she has a finger that  is red and has a crook in it. Pt agreed to wait until her appt in Feb. She will make an appt sooner if finger becomes worse.

## 2024-05-23 ENCOUNTER — Other Ambulatory Visit: Payer: Self-pay | Admitting: Family

## 2024-05-23 DIAGNOSIS — I480 Paroxysmal atrial fibrillation: Secondary | ICD-10-CM

## 2024-05-23 DIAGNOSIS — I10 Essential (primary) hypertension: Secondary | ICD-10-CM

## 2024-05-24 ENCOUNTER — Other Ambulatory Visit: Payer: Self-pay | Admitting: Family

## 2024-05-24 DIAGNOSIS — E785 Hyperlipidemia, unspecified: Secondary | ICD-10-CM

## 2024-05-24 DIAGNOSIS — I480 Paroxysmal atrial fibrillation: Secondary | ICD-10-CM

## 2024-05-24 DIAGNOSIS — I10 Essential (primary) hypertension: Secondary | ICD-10-CM

## 2024-06-01 ENCOUNTER — Ambulatory Visit (HOSPITAL_COMMUNITY): Admission: RE | Admit: 2024-06-01

## 2024-06-05 ENCOUNTER — Encounter: Admitting: Family

## 2024-06-13 ENCOUNTER — Encounter: Admitting: Family

## 2024-08-22 ENCOUNTER — Ambulatory Visit: Payer: Self-pay | Admitting: Neurology
